# Patient Record
Sex: Female | Born: 1945 | Race: White | Hispanic: No | Marital: Married | Smoking: Never smoker
Health system: Southern US, Community
[De-identification: ages and names within clinical notes are randomized; demographics above are authoritative.]

## PROBLEM LIST (undated history)

## (undated) DIAGNOSIS — R252 Cramp and spasm: Secondary | ICD-10-CM

## (undated) DIAGNOSIS — K589 Irritable bowel syndrome without diarrhea: Secondary | ICD-10-CM

## (undated) DIAGNOSIS — R200 Anesthesia of skin: Secondary | ICD-10-CM

## (undated) DIAGNOSIS — Z800 Family history of malignant neoplasm of digestive organs: Secondary | ICD-10-CM

## (undated) DIAGNOSIS — E039 Hypothyroidism, unspecified: Secondary | ICD-10-CM

## (undated) DIAGNOSIS — E559 Vitamin D deficiency, unspecified: Secondary | ICD-10-CM

## (undated) DIAGNOSIS — K5790 Diverticulosis of intestine, part unspecified, without perforation or abscess without bleeding: Secondary | ICD-10-CM

## (undated) DIAGNOSIS — N183 Chronic kidney disease, stage 3 unspecified: Secondary | ICD-10-CM

## (undated) DIAGNOSIS — F419 Anxiety disorder, unspecified: Secondary | ICD-10-CM

## (undated) DIAGNOSIS — I5030 Unspecified diastolic (congestive) heart failure: Secondary | ICD-10-CM

## (undated) DIAGNOSIS — Z803 Family history of malignant neoplasm of breast: Secondary | ICD-10-CM

## (undated) DIAGNOSIS — F319 Bipolar disorder, unspecified: Secondary | ICD-10-CM

## (undated) DIAGNOSIS — F39 Unspecified mood [affective] disorder: Secondary | ICD-10-CM

## (undated) DIAGNOSIS — M8580 Other specified disorders of bone density and structure, unspecified site: Secondary | ICD-10-CM

## (undated) DIAGNOSIS — R202 Paresthesia of skin: Secondary | ICD-10-CM

## (undated) DIAGNOSIS — E785 Hyperlipidemia, unspecified: Secondary | ICD-10-CM

## (undated) DIAGNOSIS — R7303 Prediabetes: Secondary | ICD-10-CM

## (undated) HISTORY — DX: Family history of malignant neoplasm of breast: Z80.3

## (undated) HISTORY — DX: Prediabetes: R73.03

## (undated) HISTORY — DX: Family history of malignant neoplasm of digestive organs: Z80.0

## (undated) HISTORY — DX: Diverticulosis of intestine, part unspecified, without perforation or abscess without bleeding: K57.90

## (undated) HISTORY — DX: Unspecified diastolic (congestive) heart failure: I50.30

## (undated) HISTORY — DX: Vitamin D deficiency, unspecified: E55.9

## (undated) HISTORY — DX: Irritable bowel syndrome without diarrhea: K58.9

## (undated) HISTORY — DX: Anxiety disorder, unspecified: F41.9

## (undated) HISTORY — DX: Hypothyroidism, unspecified: E03.9

## (undated) HISTORY — DX: Hyperlipidemia, unspecified: E78.5

## (undated) HISTORY — DX: Morbid (severe) obesity due to excess calories: E66.01

## (undated) HISTORY — DX: Other specified disorders of bone density and structure, unspecified site: M85.80

## (undated) HISTORY — DX: Unspecified mood (affective) disorder: F39

## (undated) HISTORY — DX: Irritable bowel syndrome, unspecified: K58.9

## (undated) HISTORY — DX: Chronic kidney disease, stage 3 unspecified: N18.30

## (undated) HISTORY — PX: OTHER SURGICAL HISTORY: SHX169

---

## 1997-08-01 ENCOUNTER — Other Ambulatory Visit: Admission: RE | Admit: 1997-08-01 | Discharge: 1997-08-01 | Payer: Self-pay | Admitting: Internal Medicine

## 1998-08-26 ENCOUNTER — Other Ambulatory Visit: Admission: RE | Admit: 1998-08-26 | Discharge: 1998-08-26 | Payer: Self-pay | Admitting: Internal Medicine

## 1999-09-17 ENCOUNTER — Other Ambulatory Visit: Admission: RE | Admit: 1999-09-17 | Discharge: 1999-09-17 | Payer: Self-pay | Admitting: Internal Medicine

## 2000-10-20 ENCOUNTER — Other Ambulatory Visit: Admission: RE | Admit: 2000-10-20 | Discharge: 2000-10-20 | Payer: Self-pay | Admitting: Internal Medicine

## 2002-03-15 ENCOUNTER — Other Ambulatory Visit: Admission: RE | Admit: 2002-03-15 | Discharge: 2002-03-15 | Payer: Self-pay | Admitting: Internal Medicine

## 2002-04-30 ENCOUNTER — Encounter: Admission: RE | Admit: 2002-04-30 | Discharge: 2002-04-30 | Payer: Self-pay | Admitting: Internal Medicine

## 2002-04-30 ENCOUNTER — Encounter: Payer: Self-pay | Admitting: Internal Medicine

## 2003-06-24 ENCOUNTER — Other Ambulatory Visit: Admission: RE | Admit: 2003-06-24 | Discharge: 2003-06-24 | Payer: Self-pay | Admitting: Internal Medicine

## 2004-08-14 ENCOUNTER — Other Ambulatory Visit: Admission: RE | Admit: 2004-08-14 | Discharge: 2004-08-14 | Payer: Self-pay | Admitting: Internal Medicine

## 2005-09-14 ENCOUNTER — Other Ambulatory Visit: Admission: RE | Admit: 2005-09-14 | Discharge: 2005-09-14 | Payer: Self-pay | Admitting: Internal Medicine

## 2006-06-09 ENCOUNTER — Ambulatory Visit (HOSPITAL_COMMUNITY): Admission: RE | Admit: 2006-06-09 | Discharge: 2006-06-09 | Payer: Self-pay | Admitting: *Deleted

## 2006-06-09 HISTORY — PX: COLONOSCOPY: SHX174

## 2006-10-04 ENCOUNTER — Other Ambulatory Visit: Admission: RE | Admit: 2006-10-04 | Discharge: 2006-10-04 | Payer: Self-pay | Admitting: Endocrinology

## 2007-10-16 ENCOUNTER — Other Ambulatory Visit: Admission: RE | Admit: 2007-10-16 | Discharge: 2007-10-16 | Payer: Self-pay | Admitting: Internal Medicine

## 2007-10-25 ENCOUNTER — Encounter: Admission: RE | Admit: 2007-10-25 | Discharge: 2007-10-25 | Payer: Self-pay | Admitting: Internal Medicine

## 2009-04-30 ENCOUNTER — Other Ambulatory Visit: Admission: RE | Admit: 2009-04-30 | Discharge: 2009-04-30 | Payer: Self-pay | Admitting: Internal Medicine

## 2009-12-10 ENCOUNTER — Encounter: Admission: RE | Admit: 2009-12-10 | Discharge: 2009-12-10 | Payer: Self-pay | Admitting: Internal Medicine

## 2010-03-07 ENCOUNTER — Other Ambulatory Visit: Payer: Self-pay | Admitting: Internal Medicine

## 2010-03-07 DIAGNOSIS — N281 Cyst of kidney, acquired: Secondary | ICD-10-CM

## 2010-03-23 ENCOUNTER — Ambulatory Visit
Admission: RE | Admit: 2010-03-23 | Discharge: 2010-03-23 | Disposition: A | Discharge status: Home / Self Care | Payer: Commercial Indemnity | Source: Ambulatory Visit | Attending: Internal Medicine | Admitting: Internal Medicine

## 2010-03-23 DIAGNOSIS — N281 Cyst of kidney, acquired: Secondary | ICD-10-CM

## 2010-07-03 NOTE — Op Note (Signed)
NAME:  MIRREN, GEST NO.:  [id number]   MEDICAL RECORD NO.:  [id number]          PATIENT TYPE:  AMB   LOCATION:  ENDO                         FACILITY:  [facility]   PHYSICIAN:  Georgiana Spinner, M.D.    DATE OF BIRTH:  July 25, 1945   DATE OF PROCEDURE:  06/09/2006  DATE OF DISCHARGE:                               OPERATIVE REPORT   PROCEDURE:  Colonoscopy.   INDICATIONS:  Colon polyps.   ANESTHESIA:  Fentanyl 100 mcg, Versed 10 mg.   DESCRIPTION OF PROCEDURE:  With the patient mildly sedated, in the left  lateral decubitus position, the Pentax videoscopic colonoscope was  inserted into the rectum and passed under direct vision to the cecum  identified by the ileocecal valve and the base of the cecum both of  which were photographed.  From this point, the colonoscope was slowly  withdrawn taking circumferential views of the colonic mucosa stopping  only in the rectum which appeared normal on direct and showed  hemorrhoids on retroflex view.  The endoscope was straightened and  withdrawn.  The patient's vital signs and pulse oximeter remained  stable.  The patient tolerated the procedure well without apparent  complications.   FINDINGS:  Internal hemorrhoids and some thickening of the sigmoid colon  consistent with diverticulosis.   PLAN:  Have patient follow up with me in about 5 years for repeat  examination or as needed.           ______________________________  Georgiana Spinner, M.D.     GMO/MEDQ  D:  06/09/2006  T:  06/09/2006  Job:  [id number]

## 2011-05-05 ENCOUNTER — Other Ambulatory Visit (HOSPITAL_COMMUNITY)
Admission: RE | Admit: 2011-05-05 | Discharge: 2011-05-05 | Disposition: A | Discharge status: Home / Self Care | Payer: Medicare Other | Source: Ambulatory Visit | Attending: Internal Medicine | Admitting: Internal Medicine

## 2011-05-05 DIAGNOSIS — Z01419 Encounter for gynecological examination (general) (routine) without abnormal findings: Secondary | ICD-10-CM | POA: Insufficient documentation

## 2011-05-17 ENCOUNTER — Encounter: Payer: Self-pay | Admitting: Gastroenterology

## 2011-06-16 ENCOUNTER — Other Ambulatory Visit: Payer: Self-pay | Admitting: Rheumatology

## 2011-06-16 ENCOUNTER — Ambulatory Visit (AMBULATORY_SURGERY_CENTER): Payer: Medicare Other | Admitting: *Deleted

## 2011-06-16 ENCOUNTER — Encounter: Payer: Self-pay | Admitting: Gastroenterology

## 2011-06-16 VITALS — Ht 62.0 in | Wt 187.7 lb

## 2011-06-16 DIAGNOSIS — M549 Dorsalgia, unspecified: Secondary | ICD-10-CM

## 2011-06-16 DIAGNOSIS — Z1211 Encounter for screening for malignant neoplasm of colon: Secondary | ICD-10-CM

## 2011-06-16 MED ORDER — MOVIPREP 100 G PO SOLR: ORAL | Status: DC

## 2011-06-18 ENCOUNTER — Encounter: Payer: Self-pay | Admitting: Gastroenterology

## 2011-06-18 ENCOUNTER — Ambulatory Visit
Admission: RE | Admit: 2011-06-18 | Discharge: 2011-06-18 | Disposition: A | Discharge status: Home / Self Care | Payer: Medicare Other | Source: Ambulatory Visit | Attending: Rheumatology | Admitting: Rheumatology

## 2011-06-18 DIAGNOSIS — M549 Dorsalgia, unspecified: Secondary | ICD-10-CM

## 2011-06-30 ENCOUNTER — Encounter: Payer: Commercial Indemnity | Admitting: Gastroenterology

## 2011-07-06 ENCOUNTER — Telehealth: Payer: Self-pay | Admitting: Gastroenterology

## 2011-07-07 NOTE — Telephone Encounter (Signed)
mailed pt a free coupon, pt aware

## 2011-07-08 ENCOUNTER — Telehealth: Payer: Self-pay | Admitting: Gastroenterology

## 2011-07-08 NOTE — Telephone Encounter (Signed)
Already gave pt a free prep

## 2011-07-09 NOTE — Telephone Encounter (Signed)
New coupon sent to pt.

## 2011-08-18 ENCOUNTER — Ambulatory Visit (AMBULATORY_SURGERY_CENTER): Payer: Medicare Other | Admitting: *Deleted

## 2011-08-18 ENCOUNTER — Encounter: Payer: Self-pay | Admitting: Gastroenterology

## 2011-08-18 VITALS — Ht 62.0 in | Wt 182.1 lb

## 2011-08-18 DIAGNOSIS — Z1211 Encounter for screening for malignant neoplasm of colon: Secondary | ICD-10-CM

## 2011-09-01 ENCOUNTER — Ambulatory Visit (AMBULATORY_SURGERY_CENTER): Payer: Medicare Other | Admitting: Gastroenterology

## 2011-09-01 ENCOUNTER — Encounter: Payer: Self-pay | Admitting: Gastroenterology

## 2011-09-01 VITALS — BP 120/39 | HR 80 | Temp 97.2°F | Resp 18 | Ht 62.0 in | Wt 187.0 lb

## 2011-09-01 DIAGNOSIS — Z1211 Encounter for screening for malignant neoplasm of colon: Secondary | ICD-10-CM

## 2011-09-01 DIAGNOSIS — K5790 Diverticulosis of intestine, part unspecified, without perforation or abscess without bleeding: Secondary | ICD-10-CM

## 2011-09-01 DIAGNOSIS — K5730 Diverticulosis of large intestine without perforation or abscess without bleeding: Secondary | ICD-10-CM

## 2011-09-01 HISTORY — DX: Diverticulosis of intestine, part unspecified, without perforation or abscess without bleeding: K57.90

## 2011-09-01 HISTORY — PX: COLONOSCOPY: SHX174

## 2011-09-01 MED ORDER — SODIUM CHLORIDE 0.9 % IV SOLN: 500.0000 mL | INTRAVENOUS | Status: DC

## 2011-09-01 NOTE — Progress Notes (Signed)
Patient did not experience any of the following events: a burn prior to discharge; a fall within the facility; wrong site/side/patient/procedure/implant event; or a hospital transfer or hospital admission upon discharge from the facility. (G8907) Patient did not have preoperative order for IV antibiotic SSI prophylaxis. (G8918)  

## 2011-09-01 NOTE — Patient Instructions (Addendum)
Discharge instructions given with verbal understanding. Handouts on diverticulosis and a high fiber diet given. Resume previous medications.YOU HAD AN ENDOSCOPIC PROCEDURE TODAY AT THE [facility]: Refer to the procedure report that was given to you for any specific questions about what was found during the examination.  If the procedure report does not answer your questions, please call your gastroenterologist to clarify.  If you requested that your care partner not be given the details of your procedure findings, then the procedure report has been included in a sealed envelope for you to review at your convenience later.  YOU SHOULD EXPECT: Some feelings of bloating in the abdomen. Passage of more gas than usual.  Walking can help get rid of the air that was put into your GI tract during the procedure and reduce the bloating. If you had a lower endoscopy (such as a colonoscopy or flexible sigmoidoscopy) you may notice spotting of blood in your stool or on the toilet paper. If you underwent a bowel prep for your procedure, then you may not have a normal bowel movement for a few days.  DIET: Your first meal following the procedure should be a light meal and then it is ok to progress to your normal diet.  A half-sandwich or bowl of soup is an example of a good first meal.  Heavy or fried foods are harder to digest and may make you feel nauseous or bloated.  Likewise meals heavy in dairy and vegetables can cause extra gas to form and this can also increase the bloating.  Drink plenty of fluids but you should avoid alcoholic beverages for 24 hours.  ACTIVITY: Your care partner should take you home directly after the procedure.  You should plan to take it easy, moving slowly for the rest of the day.  You can resume normal activity the day after the procedure however you should NOT DRIVE or use heavy machinery for 24 hours (because of the sedation medicines used during the test).    SYMPTOMS TO  REPORT IMMEDIATELY: A gastroenterologist can be reached at any hour.  During normal business hours, 8:30 AM to 5:00 PM Monday through Friday, call [phone].  After hours and on weekends, please call the GI answering service at [phone] who will take a message and have the physician on call contact you.   Following lower endoscopy (colonoscopy or flexible sigmoidoscopy):  Excessive amounts of blood in the stool  Significant tenderness or worsening of abdominal pains  Swelling of the abdomen that is new, acute  Fever of 100F or higher  FOLLOW UP: If any biopsies were taken you will be contacted by phone or by letter within the next 1-3 weeks.  Call your gastroenterologist if you have not heard about the biopsies in 3 weeks.  Our staff will call the home number listed on your records the next business day following your procedure to check on you and address any questions or concerns that you may have at that time regarding the information given to you following your procedure. This is a courtesy call and so if there is no answer at the home number and we have not heard from you through the emergency physician on call, we will assume that you have returned to your regular daily activities without incident.  SIGNATURES/CONFIDENTIALITY: You and/or your care partner have signed paperwork which will be entered into your electronic medical record.  These signatures attest to the fact that that the information above on your   After Visit Summary has been reviewed and is understood.  Full responsibility of the confidentiality of this discharge information lies with you and/or your care-partner.   

## 2011-09-01 NOTE — Op Note (Addendum)
[facility] [address] [place], [place]  [address]  COLONOSCOPY PROCEDURE REPORT  PATIENT:  [name], [name]  MR#:  [id number] BIRTHDATE:  November 04, 1945, 66 yrs. old  GENDER:  female ENDOSCOPIST:  Vania Rea. Jarold Motto, MD, [facility] REF. BY:  Pearson Grippe, M.D. PROCEDURE DATE:  09/01/2011 PROCEDURE:  Average-risk screening colonoscopy G0121 ASA CLASS:  Class II INDICATIONS:  Routine Risk Screening MEDICATIONS:   propofol (Diprivan) 120 mg IV  DESCRIPTION OF PROCEDURE:   After the risks and benefits and of the procedure were explained, informed consent was obtained. Digital rectal exam was performed and revealed no abnormalities. The LB PCF-H180AL [id number] endoscope was introduced through the anus and advanced to the cecum, which was identified by both the appendix and ileocecal valve.  The quality of the prep was excellent, using MoviPrep.  The instrument was then slowly withdrawn as the colon was fully examined. <<PROCEDUREIMAGES>>  FINDINGS:  There were mild diverticular changes in left colon. diverticulosis was found.  No polyps or cancers were seen. Retroflexed views in the rectum revealed no abnormalities.    The scope was then withdrawn from the patient and the procedure completed.  COMPLICATIONS:  None ENDOSCOPIC IMPRESSION: 1) Diverticulosis,mild,left sided diverticulosis 2) No polyps or cancers RECOMMENDATIONS: 1) High fiber diet. 2) Given your significant family history of colon cancer, you should have a repeat colonoscopy in 5 years  REPEAT EXAM:  No  ______________________________ Vania Rea. Jarold Motto, MD, [facility]  CC:  n. REVISED:  09/01/2011 09:22 AM eSIGNED:   Vania Rea. Laurens Matheny at 09/01/2011 09:22 AM  Ivin Booty, [id number]

## 2011-09-02 ENCOUNTER — Telehealth: Payer: Self-pay

## 2011-09-02 NOTE — Telephone Encounter (Signed)
  Follow up Call-  Call back number 09/01/2011  Post procedure Call Back phone  # [phone]  Permission to leave phone message Yes     Patient questions:  Do you have a fever, pain , or abdominal swelling? no Pain Score  0 *  Have you tolerated food without any problems? yes  Have you been able to return to your normal activities? yes  Do you have any questions about your discharge instructions: Diet   no Medications  no Follow up visit  no  Do you have questions or concerns about your Care? no  Actions: * If pain score is 4 or above: No action needed, pain <4.

## 2013-01-03 ENCOUNTER — Other Ambulatory Visit (HOSPITAL_COMMUNITY)
Admission: RE | Admit: 2013-01-03 | Discharge: 2013-01-03 | Disposition: A | Discharge status: Home / Self Care | Payer: Medicare Other | Source: Ambulatory Visit | Attending: Internal Medicine | Admitting: Internal Medicine

## 2013-01-03 DIAGNOSIS — Z124 Encounter for screening for malignant neoplasm of cervix: Secondary | ICD-10-CM | POA: Insufficient documentation

## 2013-06-21 ENCOUNTER — Other Ambulatory Visit: Payer: Self-pay | Admitting: Internal Medicine

## 2013-06-21 DIAGNOSIS — R971 Elevated cancer antigen 125 [CA 125]: Secondary | ICD-10-CM

## 2013-06-25 ENCOUNTER — Ambulatory Visit
Admission: RE | Admit: 2013-06-25 | Discharge: 2013-06-25 | Disposition: A | Discharge status: Home / Self Care | Payer: Medicare Other | Source: Ambulatory Visit | Attending: Internal Medicine | Admitting: Internal Medicine

## 2013-06-25 DIAGNOSIS — R971 Elevated cancer antigen 125 [CA 125]: Secondary | ICD-10-CM

## 2013-08-29 ENCOUNTER — Encounter: Payer: Self-pay | Admitting: Gynecology

## 2013-08-29 ENCOUNTER — Telehealth: Payer: Self-pay | Admitting: Gastroenterology

## 2013-08-29 ENCOUNTER — Ambulatory Visit (INDEPENDENT_AMBULATORY_CARE_PROVIDER_SITE_OTHER): Payer: Medicare Other | Admitting: Gynecology

## 2013-08-29 VITALS — BP 126/78 | Ht 61.0 in | Wt 170.0 lb

## 2013-08-29 DIAGNOSIS — Z780 Asymptomatic menopausal state: Secondary | ICD-10-CM

## 2013-08-29 DIAGNOSIS — N951 Menopausal and female climacteric states: Secondary | ICD-10-CM

## 2013-08-29 DIAGNOSIS — R971 Elevated cancer antigen 125 [CA 125]: Secondary | ICD-10-CM

## 2013-08-29 DIAGNOSIS — N952 Postmenopausal atrophic vaginitis: Secondary | ICD-10-CM

## 2013-08-29 DIAGNOSIS — K921 Melena: Secondary | ICD-10-CM

## 2013-08-29 NOTE — Progress Notes (Addendum)
[name] [name] 06-21-1945 [id number]   History:    68 y.o.  for annual gyn exam who is new to the practice. What brought her for followup is that she recently saw her PCP Dr. Jani Gravel and she had personally requested a CA 125 for screening and was otherwise asymptomatic. Her CA 125 came back elevated at 117.8. Patient denied any weight changes or change in appetite. She denied any GU or GI issues. Patient's father did have history of colon cancer. Patient stated that she had a colonoscopy by Dr. Verl Blalock in 2013 and was reported to be normal. Patient denies any vaginal bleeding. Many years ago she had been on HRT for menopausal symptoms. Patient denied any past history of abnormal Pap smears and stated that in April this year it was done by her PCP and was normal. She is being treated for hypertension and hypothyroidism. She also brought a copy of her labs from her PCP which had included a normal CBC normal comprehensive metabolic panel normal hemoglobin A1c, lipid profile with elevated triglycerides at 222 and total cholesterol 212 and LDL elevated 108.  When these results became available her PCP ordered an ultrasound which was done on May 11 and demonstrated normal size uterus with an endometrial stripe of 3.9 mm (normal postmenopausal less than 5 mm) normal right ovary. Left ovary not visualized due to large amount of bowel gas and the adnexa was described.  Normal mammogram September 2014   Past medical history,surgical history, family history and social history were all reviewed and documented in the EPIC chart.  Gynecologic History No LMP recorded. Patient is postmenopausal. Contraception: post menopausal status Last Pap:  2015 . Results were: normal Last mammogram:  2014 . Results were: normal  Obstetric History OB History  Gravida Para Term Preterm AB SAB TAB Ectopic Multiple Living  2 2        2     # Outcome Date GA Lbr Len/2nd Weight Sex Delivery Anes PTL Lv  2 PAR            1 PAR                ROS: A ROS was performed and pertinent positives and negatives are included in the history.  GENERAL: No fevers or chills. HEENT: No change in vision, no earache, sore throat or sinus congestion. NECK: No pain or stiffness. CARDIOVASCULAR: No chest pain or pressure. No palpitations. PULMONARY: No shortness of breath, cough or wheeze. GASTROINTESTINAL: No abdominal pain, nausea, vomiting or diarrhea, melena or bright red blood per rectum. GENITOURINARY: No urinary frequency, urgency, hesitancy or dysuria. MUSCULOSKELETAL: No joint or muscle pain, no back pain, no recent trauma. DERMATOLOGIC: No rash, no itching, no lesions. ENDOCRINE: No polyuria, polydipsia, no heat or cold intolerance. No recent change in weight. HEMATOLOGICAL: No anemia or easy bruising or bleeding. NEUROLOGIC: No headache, seizures, numbness, tingling or weakness. PSYCHIATRIC: No depression, no loss of interest in normal activity or change in sleep pattern.     Exam: chaperone present  BP 126/78  Ht 5\' 1"  (1.549 m)  Wt 170 lb (77.111 kg)  BMI 32.14 kg/m2  Body mass index is 32.14 kg/(m^2).  General appearance : Well developed well nourished female. No acute distress HEENT: Neck supple, trachea midline, no carotid bruits, no thyroidmegaly Lungs: Clear to auscultation, no rhonchi or wheezes, or rib retractions  Heart: Regular rate and rhythm, no murmurs or gallops Breast:Examined in sitting and supine position were symmetrical in  appearance, no palpable masses or tenderness,  no skin retraction, no nipple inversion, no nipple discharge, no skin discoloration, no axillary or supraclavicular lymphadenopathy Abdomen: no palpable masses or tenderness, no rebound or guarding Extremities: no edema or skin discoloration or tenderness  Pelvic:  Bartholin, Urethra, Skene Glands: Within normal limits             Vagina: No gross lesions or discharge, vaginal atrophy   Cervix: No gross lesions or  discharge  Uterus   anteverted , normal size, shape and consistency, non-tender and mobile  Adnexa  Without masses or tenderness  Anus and perineum  normal   Rectovaginal  normal sphincter tone without palpated masses or tenderness             Hemoccult  positive tested in office today     Assessment/Plan:  68 y.o. female for annual exam as well as to discuss the elevated CA 125 which she had requested and her PCP ran. We had a lengthy discussion of potential causes both GYN and non-GYN related as well as malignant medical conditions that could contribute to elevated CA 125. We are going to do an ultrasound here in the office in a one-2 weeks to better evaluate the left ovary that was not visualized completely because of bowel gas at time of the ultrasound in May of this year. I'm going to ask her to followup with a gastroenterologist. I'm going to refer her to my colleague Dr. Carlean Purl since Dr. Sharlett Iles has recently retired. (Positive Hemoccult stool today) to see if we can determine this elevated CA 125. I will repeat the test today ( ROMA-1) as well. Patient due for a mammogram in September of this year.  Note: This dictation was prepared with  Dragon/digital dictation along withSmart phrase technology. Any transcriptional errors that result from this process are unintentional.   Terrance Mass MD, 12:53 PM 08/29/2013

## 2013-08-29 NOTE — Telephone Encounter (Signed)
Patient declines to schedule with the APP. I have scheduled her an appt with Dr. Carlean Purl for 11/07/13.  I have put her on the cancellation list.

## 2013-08-30 ENCOUNTER — Telehealth: Payer: Self-pay | Admitting: Internal Medicine

## 2013-08-30 NOTE — Telephone Encounter (Signed)
Pt has made an appt with Dr. Carlean Purl. She will complete more stool cards from her PCP.

## 2013-09-07 LAB — OVARIAN MALIGNANCY RISK-ROMA
CA125: 15 U/mL (ref <35)
HE4: 90 pmol (ref <151)
ROMA PREMENOPAUSAL: 2.46 — AB (ref <1.31)
ROMA Postmenopausal: 1.93 (ref <2.77)

## 2013-09-13 ENCOUNTER — Encounter: Payer: Self-pay | Admitting: Registered Nurse

## 2013-09-14 ENCOUNTER — Other Ambulatory Visit: Payer: Self-pay | Admitting: Gynecology

## 2013-09-14 ENCOUNTER — Ambulatory Visit (INDEPENDENT_AMBULATORY_CARE_PROVIDER_SITE_OTHER): Payer: Medicare Other

## 2013-09-14 ENCOUNTER — Encounter: Payer: Self-pay | Admitting: Gynecology

## 2013-09-14 ENCOUNTER — Ambulatory Visit (INDEPENDENT_AMBULATORY_CARE_PROVIDER_SITE_OTHER): Payer: Medicare Other | Admitting: Gynecology

## 2013-09-14 DIAGNOSIS — R971 Elevated cancer antigen 125 [CA 125]: Secondary | ICD-10-CM

## 2013-09-14 DIAGNOSIS — N840 Polyp of corpus uteri: Secondary | ICD-10-CM | POA: Diagnosis not present

## 2013-09-14 HISTORY — PX: ENDOMETRIAL BIOPSY: SHX622

## 2013-09-14 NOTE — Progress Notes (Signed)
   Patient presents to the office today for ultrasound for better assessment of her ovaries since her PCP had drawn a CA 125 on her recently and was found to be elevated at a value 117. Due to bowel gas one of the ovaries was not seen and this was the reason for today's patient's office visit. Her history is as follows:  What brought her for followup is that she recently saw her PCP Dr. Jani Gravel and she had personally requested a CA 125 for screening and was otherwise asymptomatic. Her CA 125 came back elevated at 117.8. Patient denied any weight changes or change in appetite. She denied any GU or GI issues. Patient's father did have history of colon cancer. Patient stated that she had a colonoscopy by Dr. Verl Blalock in 2013 and was reported to be normal. Patient denies any vaginal bleeding. Many years ago she had been on HRT for menopausal symptoms. Patient denied any past history of abnormal Pap smears and stated that in April this year it was done by her PCP and was normal.  We had proceeded with ordering a ROMA-1 ovarian cancer screening test and the results were as follows:  Results for SHERREL, PLOCH (MRN [id number]) as of 09/14/2013 13:40  Ref. Range 08/29/2013 12:08  CA125 Latest Range: <35 U/mL 15  HE4 Latest Range: <151 pM 90  ROMA Postmenopausal Latest Range: <2.77  1.93  ROMA Premenopa usal Latest Range: <1.31  2.46 (H)    During that office visit on July 15 her Hemoccult tests was positive. Patient was informed and she has a followup appointment with her gastroenterologist in September. She denied any hematochezia.  During today's ultrasound there was questionable endometrial polyp so besides the ultrasound a sonohysterogram was done. The ultrasound demonstrated the following: Uterus measures 6.9 x 5.4 x 3.3 cm with endometrial stripe of 5.7 mm. The cervix was cleansed with Betadine solution and a sterile catheter was introduced into the uterine cavity normal saline was  instilled. 2 small polypoid lesions were noted one on the anterior uterine wall measuring 9 x 4 mm and the second one posterior wall measuring 6 x 4 mm. No free fluid in the cul-de-sac was noted. Otherwise normal uterus and ovaries.  Patient will have a CA 125 repeated today. She will follow up with her gastroenterologist in September. We did do an endometrial biopsy today and tissues submitted for histological evaluation for which we will await results next week. Regardless of the pathology report (if benign) I have recommended we proceed with a resectoscopic polypectomy as an outpatient procedure.We had a lengthy discussion of potential causes both GYN and non-GYN related as well as malignant medical conditions that could contribute to elevated CA 125.

## 2013-09-15 LAB — CA 125: CA 125: 8.3 U/mL (ref 0.0–30.2)

## 2013-09-19 ENCOUNTER — Telehealth: Payer: Self-pay

## 2013-09-19 NOTE — Telephone Encounter (Signed)
Patient advised.

## 2013-09-19 NOTE — Telephone Encounter (Signed)
Tell her this is not unusual. This will subside. If not were going to remove the polyps that is contributing to this at time of her surgery.

## 2013-09-19 NOTE — Telephone Encounter (Signed)
Patient called because she is concerned that she is still seeing some light red blood on the tissue paper each morning and some darker discharge during the day. She had endo bx done Friday and she expected by 6 days after this would be stopped.  She said she still feels kind of "bloated" in the bottom of her abd since the procedure.

## 2013-10-26 ENCOUNTER — Encounter (HOSPITAL_COMMUNITY): Payer: Self-pay

## 2013-11-02 ENCOUNTER — Encounter: Payer: Self-pay | Admitting: Gynecology

## 2013-11-02 ENCOUNTER — Ambulatory Visit (INDEPENDENT_AMBULATORY_CARE_PROVIDER_SITE_OTHER): Payer: Medicare Other | Admitting: Gynecology

## 2013-11-02 VITALS — BP 134/78

## 2013-11-02 DIAGNOSIS — N840 Polyp of corpus uteri: Secondary | ICD-10-CM

## 2013-11-02 DIAGNOSIS — Z780 Asymptomatic menopausal state: Secondary | ICD-10-CM

## 2013-11-02 DIAGNOSIS — Z01818 Encounter for other preprocedural examination: Secondary | ICD-10-CM

## 2013-11-02 DIAGNOSIS — N951 Menopausal and female climacteric states: Secondary | ICD-10-CM

## 2013-11-02 NOTE — Progress Notes (Signed)
[name] [name] is an 68 y.o. female for preop consultation for her scheduled resectoscopic polypectomy. Her history as follows:  Patient had seen her PCP Dr. Jani Gravel and she had personally requested a CA 125 for screening and was otherwise asymptomatic. Her CA 125 came back elevated at 117.8. Patient denied any weight changes or change in appetite. She denied any GU or GI issues. Patient's father did have history of colon cancer. Patient stated that she had a colonoscopy by Dr. Verl Blalock in 2013 and was reported to be normal. Patient denies any vaginal bleeding. Many years ago she had been on HRT for menopausal symptoms. Patient denied any past history of abnormal Pap smears and stated that in April this year it was done by her PCP and was normal.  We had proceeded with ordering a ROMA-1 ovarian cancer screening test and the results were as follows: Results for [name] [name], [name] [name] (MRN [id number]) as of 09/14/2013 13:40   Ref. Range  08/29/2013 12:08   CA125  Latest Range: <35 U/mL  15   HE4  Latest Range: <151 pM  90   ROMA Postmenopausal  Latest Range: <2.77  1.93   ROMA Premenopa  usal  Latest Range: <1.31  2.46 (H)    During that office visit on July 15 her Hemoccult tests was positive. Patient was informed and she has a followup appointment with her gastroenterologist in September. She denied any hematochezia.  Ultrasound and sonohysterogram 09/14/2013 with results as follows: there was questionable endometrial polyp so besides the ultrasound a sonohysterogram was done. The ultrasound demonstrated the following:  Uterus measures 6.9 x 5.4 x 3.3 cm with endometrial stripe of 5.7 mm. The cervix was cleansed with Betadine solution and a sterile catheter was introduced into the uterine cavity normal saline was instilled. 2 small polypoid lesions were noted one on the anterior uterine wall measuring 9 x 4 mm and the second one posterior wall measuring 6 x 4 mm. No free fluid in the cul-de-sac  was noted. Otherwise normal uterus and ovaries.  Her CA 125 was repeated on 09/14/2013 and results were normal. She scheduled to the gastroenterologist September 9. Endometrial biopsy done on that same office visit demonstrated the following: Diagnosis Endometrium, biopsy - ENDOMETRIOID TYPE POLYP. - ATROPHIC APPEARING ENDOMETRIUM. - THERE IS NO EVIDENCE OF HYPERPLASIA OR MALIGNANCY.   Pertinent Gynecological History: Menses: post-menopausal Bleeding: none Contraception: post menopausal status DES exposure: denies Blood transfusions: none Sexually transmitted diseases: no past history Previous GYN Procedures: 2 NSVD  Last mammogram: normal Date: 2014 Last pap: normal Date: 2015 OB History: G2, P2   Menstrual History: Menarche age: 68  No LMP recorded. Patient is postmenopausal.    Past Medical History  Diagnosis Date  . Hyperlipidemia   . Hypothyroidism   . Anxiety     being weaned off lithium will be off on 06/23/11  . Diverticulosis 09/01/2011    mild, left sided    Past Surgical History  Procedure Laterality Date  . Colonoscopy  06/09/06    Dr.Orr  . Endometrial biopsy  09/14/2013    Dr. Uvaldo Rising    Family History  Problem Relation Age of Onset  . Diabetes Mother   . Colon cancer Father   . Ovarian cancer Neg Hx     Social History:  reports that she has never smoked. She has never used smokeless tobacco. She reports that she does not drink alcohol or use illicit drugs.  Allergies: No Known Allergies   (  Not in a hospital admission)  REVIEW OF SYSTEMS: A ROS was performed and pertinent positives and negatives are included in the history.  GENERAL: No fevers or chills. HEENT: No change in vision, no earache, sore throat or sinus congestion. NECK: No pain or stiffness. CARDIOVASCULAR: No chest pain or pressure. No palpitations. PULMONARY: No shortness of breath, cough or wheeze. GASTROINTESTINAL: No abdominal pain, nausea, vomiting or diarrhea, melena or  bright red blood per rectum. GENITOURINARY: No urinary frequency, urgency, hesitancy or dysuria. MUSCULOSKELETAL: No joint or muscle pain, no back pain, no recent trauma. DERMATOLOGIC: No rash, no itching, no lesions. ENDOCRINE: No polyuria, polydipsia, no heat or cold intolerance. No recent change in weight. HEMATOLOGICAL: No anemia or easy bruising or bleeding. NEUROLOGIC: No headache, seizures, numbness, tingling or weakness. PSYCHIATRIC: No depression, no loss of interest in normal activity or change in sleep pattern.     Blood pressure 134/78.  Physical Exam:  HEENT:unremarkable Neck:Supple, midline, no thyroid megaly, no carotid bruits Lungs:  Clear to auscultation no rhonchi's or wheezes Heart:Regular rate and rhythm, no murmurs or gallops Breast Exam: Done earlier this year normal Abdomen: Soft nontender no rebound guarding Pelvic:BUS within normal limits Vagina: Atrophic changes Cervix: No lesions or discharge Uterus: As described above on ultrasound Adnexa: As described above on ultrasound Extremities: No cords, no edema Rectal: As described above   Assessment/Plan: Patient scheduled for resectoscopic polypectomy. The risks benefits and pros and cons of the operation were discussed. Other risks discussed as follows:                        Patient was counseled as to the risk of surgery to include the following:  1. Infection (prohylactic antibiotics will be administered)  2. DVT/Pulmonary Embolism (prophylactic pneumo compression stockings will be used)  3.Trauma to internal organs requiring additional surgical procedure to repair any injury to     Internal organs requiring perhaps additional hospitalization days.  4.Hemmorhage requiring transfusion and blood products which carry risks such as             anaphylactic reaction, hepatitis and AIDS  Patient had received literature information on the procedure scheduled and all her questions were answered and fully accepts  all risk.   [facility] HMD12:43 PMTD@Note : This dictation was prepared with  Dragon/digital dictation along withSmart phrase technology. Any transcriptional errors that result from this process are unintentional.      Terrance Mass 11/02/2013, 11:24 AM  Note: This dictation was prepared with  Dragon/digital dictation along withSmart phrase technology. Any transcriptional errors that result from this process are unintentional.

## 2013-11-07 ENCOUNTER — Encounter: Payer: Self-pay | Admitting: Internal Medicine

## 2013-11-07 ENCOUNTER — Ambulatory Visit (INDEPENDENT_AMBULATORY_CARE_PROVIDER_SITE_OTHER): Payer: Medicare Other | Admitting: Internal Medicine

## 2013-11-07 VITALS — BP 130/62 | HR 88 | Ht 61.0 in | Wt 158.0 lb

## 2013-11-07 DIAGNOSIS — R195 Other fecal abnormalities: Secondary | ICD-10-CM

## 2013-11-07 NOTE — Progress Notes (Signed)
Subjective:    Patient ID: [name] [name], female    DOB: [birth year], 68 y.o.   MRN: [id number]  HPI  The patient is a very nice lady who had an elevated CA 125 after she requested this for screening. She saw Dr. Toney Rakes of gynecology and a repeat test was normal. She has some uterine polyps and is awaiting surgery remove those. During a pelvic exam with rectal she was heme positive. Subsequently spontaneously passed stools x3, guaiac testing were negative. These were done through primary care. She had a normal colonoscopy in 2013 by my partner Dr. Sharlett Iles. She did have diarrhea in the spring and early summer, that is resolved. Work up was negative the stool studies. On one occasion she might have had some pink discharge on the toilet paper with wiping though no frank bleeding.  No Known Allergies Outpatient Prescriptions Prior to Visit  Medication Sig Dispense Refill  . b complex vitamins tablet Take 1 tablet by mouth daily.      . Biotin 5000 MCG TABS Take 1 tablet by mouth daily.      . Calcium Carbonate-Vitamin D (CALCIUM 600+D) 600-400 MG-UNIT per tablet Take 1 tablet by mouth 2 (two) times daily.      . carbamazepine (TEGRETOL XR) 200 MG 12 hr tablet Take 200-600 mg by mouth 2 (two) times daily. One tablet in the am and two tablets before bedtime.      . Cholecalciferol (VITAMIN D-3) 1000 UNITS CAPS Take 1 capsule by mouth daily.      . Chromium-Cinnamon (CINNAMON PLUS CHROMIUM PO) Take 2 tablets by mouth 2 (two) times daily.      . Coenzyme Q10 (CO Q 10 PO) Take 300 mg by mouth daily.      . Magnesium 400 MG TABS Take 1 tablet by mouth daily.      . Multiple Vitamins-Minerals (MULTIVITAMIN WITH MINERALS) tablet Take 1 tablet by mouth daily.      . Omega-3 Fatty Acids (FISH OIL) 1200 MG CAPS Take 1 capsule by mouth 2 (two) times daily.       . Probiotic Product (PROBIOTIC DAILY PO) Take 2 capsules by mouth daily.      . simvastatin (ZOCOR) 10 MG tablet Take 10 mg by mouth at  bedtime.      [name] SYNTHROID 88 MCG tablet Take 1 tablet by mouth Daily.       No facility-administered medications prior to visit.   Past Medical History  Diagnosis Date  . Hyperlipidemia   . Hypothyroidism   . Anxiety     being weaned off lithium will be off on 06/23/11  . Diverticulosis 09/01/2011    mild, left sided   Past Surgical History  Procedure Laterality Date  . Colonoscopy  06/09/06    Dr.Orr  . Endometrial biopsy  09/14/2013    Dr. Uvaldo Rising  . Colonoscopy  09/01/2011   History   Social History  . Marital Status: Married    Spouse Name: N/A    Number of Children: 2  . Years of Education: N/A   Occupational History  . Retired    Social History Main Topics  . Smoking status: Never Smoker   . Smokeless tobacco: Never Used  . Alcohol Use: No  . Drug Use: No   Social History Narrative   Married 1 son one daughter retired.   Family History  Problem Relation Age of Onset  . Diabetes Mother   . Colon cancer Father 71  .  Ovarian cancer Neg Hx   . Colon polyps Neg Hx     Review of Systems She does open in that she is anxious and concerned about health problems or potential health problems. Otherwise, As per history of present illness all other negative    Objective:   Physical Exam Pleasant obese elderly white woman looking younger than stated age. She has some alopecia on her scalp  Rectal exam with female staff present:  Anoderm inspection revealed bulging small hemorrhoid, no mass on digital exam, brown stool present small amount No mass or rectocele present.     Assessment & Plan:   1. Heme positive stool    With negative colonoscopy in 2013 and the findings today of a small hemorrhoid, I would not repeat any type of endoscopic evaluation. She is nervous and anxious about her father having had colon cancer though he died from that at age 53. I explained that she is not an increased risk of colon cancer given his advanced age and would not  recommend repeating a colonoscopy sooner than 10 years on a routine basis. She could certainly do stool testing for blood and/or DNA at about 5 years, but that is probably overfill as well. It may be necessary to relieve her anxiety.  I appreciate the opportunity to care for this patient.   CC: Jani Gravel, MD Also copy Dr. Uvaldo Rising

## 2013-11-07 NOTE — Patient Instructions (Signed)
Please follow up with Dr Carlean Purl as needed.  Thank you for choosing [facility]!

## 2013-11-09 ENCOUNTER — Encounter (HOSPITAL_COMMUNITY): Payer: Self-pay

## 2013-11-09 ENCOUNTER — Encounter (HOSPITAL_COMMUNITY)
Admission: RE | Admit: 2013-11-09 | Discharge: 2013-11-09 | Disposition: A | Discharge status: Home / Self Care | Payer: Medicare Other | Source: Ambulatory Visit | Attending: Gynecology | Admitting: Gynecology

## 2013-11-09 DIAGNOSIS — N840 Polyp of corpus uteri: Secondary | ICD-10-CM | POA: Diagnosis not present

## 2013-11-09 DIAGNOSIS — R971 Elevated cancer antigen 125 [CA 125]: Secondary | ICD-10-CM | POA: Diagnosis not present

## 2013-11-09 HISTORY — DX: Cramp and spasm: R25.2

## 2013-11-09 HISTORY — DX: Paresthesia of skin: R20.2

## 2013-11-09 HISTORY — DX: Anesthesia of skin: R20.0

## 2013-11-09 HISTORY — DX: Paresthesia of skin: R20.0

## 2013-11-09 LAB — URINALYSIS, ROUTINE W REFLEX MICROSCOPIC
Bilirubin Urine: NEGATIVE
GLUCOSE, UA: NEGATIVE mg/dL
Hgb urine dipstick: NEGATIVE
KETONES UR: NEGATIVE mg/dL
Leukocytes, UA: NEGATIVE
Nitrite: NEGATIVE
PROTEIN: NEGATIVE mg/dL
Specific Gravity, Urine: <1.005 — ABNORMAL LOW (ref 1.005–1.030)
UROBILINOGEN UA: 0.2 mg/dL (ref 0.0–1.0)
pH: 7 (ref 5.0–8.0)

## 2013-11-09 LAB — BASIC METABOLIC PANEL
Anion gap: 12 (ref 5–15)
BUN: 27 mg/dL — ABNORMAL HIGH (ref 6–23)
CALCIUM: 10.1 mg/dL (ref 8.4–10.5)
CO2: 28 mEq/L (ref 19–32)
Chloride: 102 mEq/L (ref 96–112)
Creatinine, Ser: 0.92 mg/dL (ref 0.50–1.10)
GFR, EST AFRICAN AMERICAN: 72 mL/min — AB (ref >90)
GFR, EST NON AFRICAN AMERICAN: 63 mL/min — AB (ref >90)
GLUCOSE: 103 mg/dL — AB (ref 70–99)
Potassium: 4.3 mEq/L (ref 3.7–5.3)
SODIUM: 142 meq/L (ref 137–147)

## 2013-11-09 LAB — CBC
HCT: 37.7 % (ref 36.0–46.0)
HEMOGLOBIN: 13 g/dL (ref 12.0–15.0)
MCH: 32 pg (ref 26.0–34.0)
MCHC: 34.5 g/dL (ref 30.0–36.0)
MCV: 92.9 fL (ref 78.0–100.0)
PLATELETS: 221 10*3/uL (ref 150–400)
RBC: 4.06 MIL/uL (ref 3.87–5.11)
RDW: 12.3 % (ref 11.5–15.5)
WBC: 7.4 10*3/uL (ref 4.0–10.5)

## 2013-11-09 NOTE — Patient Instructions (Signed)
Your procedure is scheduled on:  Monday, November 12, 2013  Enter through the [employer] [facility] at: 6:00 AM  Pick up the phone at the desk and dial [phone].  Call this number if you have problems the morning of surgery: [phone].  Remember: Do NOT eat food: AFTER MIDNIGHT SUNDAY Do NOT drink clear liquids after:  AFTER MIDNIGHT SUNDAY Take these medicines the morning of surgery with a SIP OF WATER: SYNTHROID, TEGRETOL  Do NOT wear jewelry (body piercing), metal hair clips/bobby pins, make-up, or nail polish. Do NOT wear lotions, powders, or perfumes.  You may wear deoderant. Do NOT shave for 48 hours prior to surgery. Do NOT bring valuables to the hospital. Contacts, dentures, or bridgework may not be worn into surgery. Have a responsible adult drive you home and stay with you for 24 hours after your procedure

## 2013-11-11 MED ORDER — DEXTROSE 5 % IV SOLN
2.0000 g | INTRAVENOUS | Status: AC
  Administered 2013-11-12: 2 g via INTRAVENOUS
  Filled 2013-11-11: qty 2

## 2013-11-12 ENCOUNTER — Ambulatory Visit (HOSPITAL_COMMUNITY)
Admission: RE | Admit: 2013-11-12 | Discharge: 2013-11-12 | Disposition: A | Discharge status: Home / Self Care | Payer: Medicare Other | Source: Ambulatory Visit | Attending: Gynecology | Admitting: Gynecology

## 2013-11-12 ENCOUNTER — Encounter (HOSPITAL_COMMUNITY)
Admission: RE | Disposition: A | Discharge status: Home / Self Care | Payer: Self-pay | Source: Ambulatory Visit | Attending: Gynecology

## 2013-11-12 ENCOUNTER — Ambulatory Visit (HOSPITAL_COMMUNITY): Payer: Medicare Other | Admitting: Anesthesiology

## 2013-11-12 ENCOUNTER — Encounter (HOSPITAL_COMMUNITY): Payer: Medicare Other | Admitting: Anesthesiology

## 2013-11-12 ENCOUNTER — Encounter (HOSPITAL_COMMUNITY): Payer: Self-pay

## 2013-11-12 DIAGNOSIS — R971 Elevated cancer antigen 125 [CA 125]: Secondary | ICD-10-CM | POA: Insufficient documentation

## 2013-11-12 DIAGNOSIS — N840 Polyp of corpus uteri: Secondary | ICD-10-CM

## 2013-11-12 DIAGNOSIS — N856 Intrauterine synechiae: Secondary | ICD-10-CM

## 2013-11-12 DIAGNOSIS — Z9889 Other specified postprocedural states: Secondary | ICD-10-CM

## 2013-11-12 HISTORY — PX: DILATATION & CURETTAGE/HYSTEROSCOPY WITH TRUECLEAR: SHX6353

## 2013-11-12 SURGERY — DILATATION & CURETTAGE/HYSTEROSCOPY WITH TRUCLEAR
Anesthesia: General | Site: Vagina

## 2013-11-12 MED ORDER — PROPOFOL 10 MG/ML IV BOLUS
INTRAVENOUS | Status: DC | PRN
  Administered 2013-11-12: 140 mg via INTRAVENOUS

## 2013-11-12 MED ORDER — MIDAZOLAM HCL 2 MG/2ML IJ SOLN
INTRAMUSCULAR | Status: AC
  Filled 2013-11-12: qty 2

## 2013-11-12 MED ORDER — MEPERIDINE HCL 25 MG/ML IJ SOLN: 6.2500 mg | INTRAMUSCULAR | Status: DC | PRN

## 2013-11-12 MED ORDER — DEXAMETHASONE SODIUM PHOSPHATE 4 MG/ML IJ SOLN
INTRAMUSCULAR | Status: AC
  Filled 2013-11-12: qty 1

## 2013-11-12 MED ORDER — LIDOCAINE HCL (CARDIAC) 20 MG/ML IV SOLN
INTRAVENOUS | Status: DC | PRN
  Administered 2013-11-12: 40 mg via INTRAVENOUS

## 2013-11-12 MED ORDER — SILVER NITRATE-POT NITRATE 75-25 % EX MISC
CUTANEOUS | Status: AC
  Filled 2013-11-12: qty 1

## 2013-11-12 MED ORDER — LIDOCAINE HCL (CARDIAC) 20 MG/ML IV SOLN
INTRAVENOUS | Status: AC
  Filled 2013-11-12: qty 5

## 2013-11-12 MED ORDER — FENTANYL CITRATE 0.05 MG/ML IJ SOLN
INTRAMUSCULAR | Status: DC | PRN
  Administered 2013-11-12: 50 ug via INTRAVENOUS
  Administered 2013-11-12: 25 ug via INTRAVENOUS
  Administered 2013-11-12: 50 ug via INTRAVENOUS

## 2013-11-12 MED ORDER — SCOPOLAMINE 1 MG/3DAYS TD PT72
MEDICATED_PATCH | TRANSDERMAL | Status: AC
  Filled 2013-11-12: qty 1

## 2013-11-12 MED ORDER — DEXAMETHASONE SODIUM PHOSPHATE 10 MG/ML IJ SOLN
INTRAMUSCULAR | Status: DC | PRN
  Administered 2013-11-12: 4 mg via INTRAVENOUS

## 2013-11-12 MED ORDER — FENTANYL CITRATE 0.05 MG/ML IJ SOLN: 25.0000 ug | INTRAMUSCULAR | Status: DC | PRN

## 2013-11-12 MED ORDER — SODIUM CHLORIDE 0.9 % IJ SOLN
INTRAMUSCULAR | Status: AC
  Filled 2013-11-12: qty 50

## 2013-11-12 MED ORDER — FENTANYL CITRATE 0.05 MG/ML IJ SOLN
INTRAMUSCULAR | Status: AC
  Filled 2013-11-12: qty 5

## 2013-11-12 MED ORDER — SODIUM CHLORIDE 0.9 % IR SOLN
Status: DC | PRN
  Administered 2013-11-12: 3000 mL

## 2013-11-12 MED ORDER — ONDANSETRON HCL 4 MG/2ML IJ SOLN: 4.0000 mg | Freq: Once | INTRAMUSCULAR | Status: DC | PRN

## 2013-11-12 MED ORDER — EPHEDRINE SULFATE 50 MG/ML IJ SOLN
INTRAMUSCULAR | Status: DC | PRN
  Administered 2013-11-12: 10 mg via INTRAVENOUS

## 2013-11-12 MED ORDER — ONDANSETRON HCL 4 MG/2ML IJ SOLN
INTRAMUSCULAR | Status: AC
  Filled 2013-11-12: qty 2

## 2013-11-12 MED ORDER — PHENYLEPHRINE 40 MCG/ML (10ML) SYRINGE FOR IV PUSH (FOR BLOOD PRESSURE SUPPORT)
PREFILLED_SYRINGE | INTRAVENOUS | Status: AC
  Filled 2013-11-12: qty 5

## 2013-11-12 MED ORDER — LACTATED RINGERS IV SOLN
INTRAVENOUS | Status: DC
  Administered 2013-11-12: 07:00:00 via INTRAVENOUS

## 2013-11-12 MED ORDER — ONDANSETRON HCL 4 MG/2ML IJ SOLN
INTRAMUSCULAR | Status: DC | PRN
Start: 2013-11-12 — End: 2013-11-12
  Administered 2013-11-12: 4 mg via INTRAVENOUS

## 2013-11-12 MED ORDER — MIDAZOLAM HCL 2 MG/2ML IJ SOLN
INTRAMUSCULAR | Status: DC | PRN
  Administered 2013-11-12: 0.5 mg via INTRAVENOUS

## 2013-11-12 MED ORDER — PROPOFOL 10 MG/ML IV EMUL
INTRAVENOUS | Status: AC
  Filled 2013-11-12: qty 20

## 2013-11-12 MED ORDER — EPHEDRINE 5 MG/ML INJ
INTRAVENOUS | Status: AC
  Filled 2013-11-12: qty 10

## 2013-11-12 MED ORDER — VASOPRESSIN 20 UNIT/ML IJ SOLN
INTRAMUSCULAR | Status: DC | PRN
  Administered 2013-11-12: 20 [IU]

## 2013-11-12 MED ORDER — KETOROLAC TROMETHAMINE 30 MG/ML IJ SOLN: 15.0000 mg | Freq: Once | INTRAMUSCULAR | Status: DC | PRN

## 2013-11-12 MED ORDER — VASOPRESSIN 20 UNIT/ML IJ SOLN
INTRAMUSCULAR | Status: AC
  Filled 2013-11-12: qty 1

## 2013-11-12 SURGICAL SUPPLY — 27 items
BLADE INCISOR TRUC PLUS 2.9 (ABLATOR) ×1 IMPLANT
CANISTERS HI-FLOW 3000CC (CANNISTER) ×2 IMPLANT
CATH FOLEY 2WAY SLVR 30CC 16FR (CATHETERS) IMPLANT
CATH ROBINSON RED A/P 16FR (CATHETERS) ×2 IMPLANT
CLOTH BEACON ORANGE TIMEOUT ST (SAFETY) ×2 IMPLANT
CONTAINER PREFILL 10% NBF 60ML (FORM) ×4 IMPLANT
DRAPE HYSTEROSCOPY (DRAPE) ×2 IMPLANT
ELECTRODE ROLLER VERSAPOINT (ELECTRODE) IMPLANT
ELECTRODE RT ANGLE VERSAPOINT (CUTTING LOOP) IMPLANT
GLOVE BIOGEL PI IND STRL 8 (GLOVE) ×1 IMPLANT
GLOVE BIOGEL PI INDICATOR 8 (GLOVE) ×1
GLOVE ECLIPSE 7.5 STRL STRAW (GLOVE) ×4 IMPLANT
GOWN STRL REUS W/TWL LRG LVL3 (GOWN DISPOSABLE) ×4 IMPLANT
INCISOR TRUC PLUS BLADE 2.9 (ABLATOR) ×2
KIT HYSTEROSCOPY TRUCLEAR (ABLATOR) ×2 IMPLANT
LOOP ANGLED CUTTING 22FR (CUTTING LOOP) IMPLANT
MORCELLATOR RECIP TRUCLEAR 4.0 (ABLATOR) IMPLANT
PACK VAGINAL MINOR WOMEN LF (CUSTOM PROCEDURE TRAY) ×2 IMPLANT
PAD OB MATERNITY 4.3X12.25 (PERSONAL CARE ITEMS) ×2 IMPLANT
PAD PREP 24X48 CUFFED NSTRL (MISCELLANEOUS) ×2 IMPLANT
PLUG CATH AND CAP STER (CATHETERS) IMPLANT
SET BERKELEY SUCTION TUBING (SUCTIONS) IMPLANT
SYR 30ML LL (SYRINGE) IMPLANT
TOWEL OR 17X24 6PK STRL BLUE (TOWEL DISPOSABLE) ×4 IMPLANT
VACURETTE 8 RIGID CVD (CANNULA) IMPLANT
VACURETTE 9 RIGID CVD (CANNULA) IMPLANT
WATER STERILE IRR 1000ML POUR (IV SOLUTION) ×2 IMPLANT

## 2013-11-12 NOTE — Op Note (Signed)
   Operative Note  11/12/2013  8:16 AM  PATIENT:  [name] [name]  68 y.o. female  PRE-OPERATIVE DIAGNOSIS:  endometrial lesion, elevated CA 125  POST-OPERATIVE DIAGNOSIS:  endometrial lesion  PROCEDURE:  Procedure(s): RESECTOSCOPIC POLYPECTOMY Irish Lack, D&C  SURGEON:  Surgeon(s): Terrance Mass, MD  ANESTHESIA:   general  FINDINGS:Enometrial synechia and two small polyps (ant/post)  DESCRIPTION OF OPERATION:DESCRIPTION OF OPERATION: The patient was taken to the operating room where she underwent successful general endotracheal anesthesia. The patient was identified during the time out as well as procedure to be performed. The patient received Cefotan 2 g IV for infection prophylaxis and PAS stockings for DVT prophylaxis. The vagina and perineum were prepped and draped in usual sterile fashion and the legs were placed in the high lithotomy position. A red rubber Quentin Cornwall was inserted to evacuate the bladder was contents for approximately 50 cc's . Exam under anesthesia demonstrated a uterus 4  week size with no palpable adnexal masses. A weighted speculum was placed on the posterior vaginal vault. A single-tooth tenaculum was placed on the anterior cervical lip. The uterus sounded to 6 1/2 cm. Pratt dilators to size 21 mm was utilized to dilate the cervical canal. The 2.9  mm Smith and Nephew operative resectoscope was utilized after introducing the operative sheath with obturator. The obturator was then removed the scope was inserted followed by insertion of the  True Clear Ultra Reciprocating Resectoscope 4.0. Normal saline was the distending media. It was noted at this time the patient had  Endometrial synechaia from anterior uterine wall to posterior uterine wall and two small polyps (ant and pos).   . With the true clear morcellator the endometrial synechia and polyps  were resected and passed off the operative field for histological evaluation. Adequate hemostasis was present.  Patient tolerated procedure well was extubated and transferred to recovery room stable vital signs.  EBL was minimal. Fluid deficit was170 cc's.Distending media was normal saline.  ESTIMATED BLOOD LOSS: no blood loss   Intake/Output Summary (Last 24 hours) at 11/12/13 0816 Last data filed at 11/12/13 0753  Gross per 24 hour  Intake    900 ml  Output     50 ml  Net    850 ml     BLOOD ADMINISTERED:none   LOCAL MEDICATIONS USED:  OTHER Pitressin Pararacervical: Pitressin  10 cc's/50 cc's NACL  SPECIMEN:  Source of Specimen:  Endometrial polyp   DISPOSITION OF SPECIMEN:  PATHOLOGY  COUNTS:  YES  PLAN OF CARE: Transfer to PACU  [facility] HMD8:16 AMTD@

## 2013-11-12 NOTE — Transfer of Care (Signed)
Immediate Anesthesia Transfer of Care Note  Patient: [name] [name]  Procedure(s) Performed: Procedure(s): RESECTOSCOPIC POLYPECTOMY W TRUCLEAR, D&C (N/A)  Patient Location: PACU  Anesthesia Type:General  Level of Consciousness: awake, alert , oriented and patient cooperative  Airway & Oxygen Therapy: Patient Spontanous Breathing and Patient connected to nasal cannula oxygen  Post-op Assessment: Report given to PACU RN and Post -op Vital signs reviewed and stable  Post vital signs: Reviewed and stable  Complications: No apparent anesthesia complications

## 2013-11-12 NOTE — Discharge Instructions (Signed)

## 2013-11-12 NOTE — Interval H&P Note (Signed)
History and Physical Interval Note:  11/12/2013 7:16 AM  [name] [name]  has presented today for surgery, with the diagnosis of endometrial lesion, elevated CA 125  The various methods of treatment have been discussed with the patient and family. After consideration of risks, benefits and other options for treatment, the patient has consented to  Procedure(s): RESECTOSCOPIC POLYPECTOMY W TRUCLEAR, D&C (N/A) as a surgical intervention .  The patient's history has been reviewed, patient examined, no change in status, stable for surgery.  I have reviewed the patient's chart and labs.  Questions were answered to the patient's satisfaction.     Terrance Mass

## 2013-11-12 NOTE — Anesthesia Preprocedure Evaluation (Signed)
Anesthesia Evaluation  Patient identified by MRN, date of birth, ID band Patient awake    Reviewed: Allergy & Precautions, H&P , NPO status , Patient's Chart, lab work & pertinent test results  Airway Mallampati: II TM Distance: >3 FB Neck ROM: full    Dental no notable dental hx. (+) Teeth Intact   Pulmonary neg pulmonary ROS,    Pulmonary exam normal       Cardiovascular negative cardio ROS      Neuro/Psych PSYCHIATRIC DISORDERS Anxiety negative neurological ROS     GI/Hepatic negative GI ROS, Neg liver ROS,   Endo/Other  negative endocrine ROS  Renal/GU negative Renal ROS     Musculoskeletal   Abdominal Normal abdominal exam  (+)   Peds  Hematology negative hematology ROS (+)   Anesthesia Other Findings   Reproductive/Obstetrics negative OB ROS                           Anesthesia Physical Anesthesia Plan  ASA: II  Anesthesia Plan: General   Post-op Pain Management:    Induction: Intravenous  Airway Management Planned: LMA  Additional Equipment:   Intra-op Plan:   Post-operative Plan:   Informed Consent: I have reviewed the patients History and Physical, chart, labs and discussed the procedure including the risks, benefits and alternatives for the proposed anesthesia with the patient or authorized representative who has indicated his/her understanding and acceptance.     Plan Discussed with: CRNA, Surgeon and Anesthesiologist  Anesthesia Plan Comments:         Anesthesia Quick Evaluation

## 2013-11-12 NOTE — Anesthesia Postprocedure Evaluation (Signed)
  Anesthesia Post-op Note  Anesthesia Post Note  Patient: [name] [name]  Procedure(s) Performed: Procedure(s) (LRB): RESECTOSCOPIC POLYPECTOMY W TRUCLEAR, D&C (N/A)  Anesthesia type: General  Patient location: PACU  Post pain: Pain level controlled  Post assessment: Post-op Vital signs reviewed  Last Vitals:  Filed Vitals:   11/12/13 0945  BP: 123/58  Pulse: 85  Temp: 36.6 C  Resp: 16    Post vital signs: Reviewed  Level of consciousness: sedated  Complications: No apparent anesthesia complications

## 2013-11-12 NOTE — Interval H&P Note (Signed)
History and Physical Interval Note:  11/12/2013 7:17 AM  [name] [name]  has presented today for surgery, with the diagnosis of endometrial lesion, elevated CA 125  The various methods of treatment have been discussed with the patient and family. After consideration of risks, benefits and other options for treatment, the patient has consented to  Procedure(s): RESECTOSCOPIC POLYPECTOMY W TRUCLEAR, D&C (N/A) as a surgical intervention .  The patient's history has been reviewed, patient examined, no change in status, stable for surgery.  I have reviewed the patient's chart and labs.  Questions were answered to the patient's satisfaction.     Terrance Mass

## 2013-11-12 NOTE — H&P (View-Only) (Signed)
[name] [name] is an 68 y.o. female for preop consultation for her scheduled resectoscopic polypectomy. Her history as follows:  Patient had seen her PCP Dr. Jani [name] and she had personally requested a CA 125 for screening and was otherwise asymptomatic. Her CA 125 came back elevated at 117.8. Patient denied any weight changes or change in appetite. She denied any GU or GI issues. Patient's father did have history of colon cancer. Patient stated that she had a colonoscopy by Dr. Verl [name] in 2013 and was reported to be normal. Patient denies any vaginal bleeding. Many years ago she had been on HRT for menopausal symptoms. Patient denied any past history of abnormal Pap smears and stated that in April this year it was done by her PCP and was normal.  We had proceeded with ordering a ROMA-1 ovarian cancer screening test and the results were as follows: Results for [name], [name] (MRN [id number]) as of 09/14/2013 13:40   Ref. Range  08/29/2013 12:08   CA125  Latest Range: <35 U/mL  15   HE4  Latest Range: <151 pM  90   ROMA Postmenopausal  Latest Range: <2.77  1.93   ROMA Premenopa  usal  Latest Range: <1.31  2.46 (H)    During that office visit on July 15 her Hemoccult tests was positive. Patient was informed and she has a followup appointment with her gastroenterologist in September. She denied any hematochezia.  Ultrasound and sonohysterogram 09/14/2013 with results as follows: there was questionable endometrial polyp so besides the ultrasound a sonohysterogram was done. The ultrasound demonstrated the following:  Uterus measures 6.9 x 5.4 x 3.3 cm with endometrial stripe of 5.7 mm. The cervix was cleansed with Betadine solution and a sterile catheter was introduced into the uterine cavity normal saline was instilled. 2 small polypoid lesions were noted one on the anterior uterine wall measuring 9 x 4 mm and the second one posterior wall measuring 6 x 4 mm. No free fluid in the cul-de-sac  was noted. Otherwise normal uterus and ovaries.  Her CA 125 was repeated on 09/14/2013 and results were normal. She scheduled to the gastroenterologist September 9. Endometrial biopsy done on that same office visit demonstrated the following: Diagnosis Endometrium, biopsy - ENDOMETRIOID TYPE POLYP. - ATROPHIC APPEARING ENDOMETRIUM. - THERE IS NO EVIDENCE OF HYPERPLASIA OR MALIGNANCY.   Pertinent Gynecological History: Menses: post-menopausal Bleeding: none Contraception: post menopausal status DES exposure: denies Blood transfusions: none Sexually transmitted diseases: no past history Previous GYN Procedures: 2 NSVD  Last mammogram: normal Date: 2014 Last pap: normal Date: 2015 OB History: G2, P2   Menstrual History: Menarche age: 52  No LMP recorded. Patient is postmenopausal.    Past Medical History  Diagnosis Date  . Hyperlipidemia   . Hypothyroidism   . Anxiety     being weaned off lithium will be off on 06/23/11  . Diverticulosis 09/01/2011    mild, left sided    Past Surgical History  Procedure Laterality Date  . Colonoscopy  06/09/06    Dr.Orr  . Endometrial biopsy  09/14/2013    Dr. Uvaldo [name]    Family History  Problem Relation Age of Onset  . Diabetes Mother   . Colon cancer Father   . Ovarian cancer Neg Hx     Social History:  reports that she has never smoked. She has never used smokeless tobacco. She reports that she does not drink alcohol or use illicit drugs.  Allergies: No Known Allergies   (  Not in a hospital admission)  REVIEW OF SYSTEMS: A ROS was performed and pertinent positives and negatives are included in the history.  GENERAL: No fevers or chills. HEENT: No change in vision, no earache, sore throat or sinus congestion. NECK: No pain or stiffness. CARDIOVASCULAR: No chest pain or pressure. No palpitations. PULMONARY: No shortness of breath, cough or wheeze. GASTROINTESTINAL: No abdominal pain, nausea, vomiting or diarrhea, melena or  bright red blood per rectum. GENITOURINARY: No urinary frequency, urgency, hesitancy or dysuria. MUSCULOSKELETAL: No joint or muscle pain, no back pain, no recent trauma. DERMATOLOGIC: No rash, no itching, no lesions. ENDOCRINE: No polyuria, polydipsia, no heat or cold intolerance. No recent change in weight. HEMATOLOGICAL: No anemia or easy bruising or bleeding. NEUROLOGIC: No headache, seizures, numbness, tingling or weakness. PSYCHIATRIC: No depression, no loss of interest in normal activity or change in sleep pattern.     Blood pressure 134/78.  Physical Exam:  HEENT:unremarkable Neck:Supple, midline, no thyroid megaly, no carotid bruits Lungs:  Clear to auscultation no rhonchi's or wheezes Heart:Regular rate and rhythm, no murmurs or gallops Breast Exam: Done earlier this year normal Abdomen: Soft nontender no rebound guarding Pelvic:BUS within normal limits Vagina: Atrophic changes Cervix: No lesions or discharge Uterus: As described above on ultrasound Adnexa: As described above on ultrasound Extremities: No cords, no edema Rectal: As described above   Assessment/Plan: Patient scheduled for resectoscopic polypectomy. The risks benefits and pros and cons of the operation were discussed. Other risks discussed as follows:                        Patient was counseled as to the risk of surgery to include the following:  1. Infection (prohylactic antibiotics will be administered)  2. DVT/Pulmonary Embolism (prophylactic pneumo compression stockings will be used)  3.Trauma to internal organs requiring additional surgical procedure to repair any injury to     Internal organs requiring perhaps additional hospitalization days.  4.Hemmorhage requiring transfusion and blood products which carry risks such as             anaphylactic reaction, hepatitis and AIDS  Patient had received literature information on the procedure scheduled and all her questions were answered and fully accepts  all risk.   [facility] HMD12:43 PMTD@Note : This dictation was prepared with  Dragon/digital dictation along withSmart phrase technology. Any transcriptional errors that result from this process are unintentional.      Terrance Mass 11/02/2013, 11:24 AM  Note: This dictation was prepared with  Dragon/digital dictation along withSmart phrase technology. Any transcriptional errors that result from this process are unintentional.

## 2013-11-12 NOTE — H&P (View-Only) (Signed)
Subjective:    Patient ID: [name] [name], female    DOB: 12-25-1945, 68 y.o.   MRN: [id number]  HPI  The patient is a very nice lady who had an elevated CA 125 after she requested this for screening. She saw Dr. Toney Rakes of gynecology and a repeat test was normal. She has some uterine polyps and is awaiting surgery remove those. During a pelvic exam with rectal she was heme positive. Subsequently spontaneously passed stools x3, guaiac testing were negative. These were done through primary care. She had a normal colonoscopy in 2013 by my partner Dr. Sharlett Iles. She did have diarrhea in the spring and early summer, that is resolved. Work up was negative the stool studies. On one occasion she might have had some pink discharge on the toilet paper with wiping though no frank bleeding.  No Known Allergies Outpatient Prescriptions Prior to Visit  Medication Sig Dispense Refill  . b complex vitamins tablet Take 1 tablet by mouth daily.      . Biotin 5000 MCG TABS Take 1 tablet by mouth daily.      . Calcium Carbonate-Vitamin D (CALCIUM 600+D) 600-400 MG-UNIT per tablet Take 1 tablet by mouth 2 (two) times daily.      . carbamazepine (TEGRETOL XR) 200 MG 12 hr tablet Take 200-600 mg by mouth 2 (two) times daily. One tablet in the am and two tablets before bedtime.      . Cholecalciferol (VITAMIN D-3) 1000 UNITS CAPS Take 1 capsule by mouth daily.      . Chromium-Cinnamon (CINNAMON PLUS CHROMIUM PO) Take 2 tablets by mouth 2 (two) times daily.      . Coenzyme Q10 (CO Q 10 PO) Take 300 mg by mouth daily.      . Magnesium 400 MG TABS Take 1 tablet by mouth daily.      . Multiple Vitamins-Minerals (MULTIVITAMIN WITH MINERALS) tablet Take 1 tablet by mouth daily.      . Omega-3 Fatty Acids (FISH OIL) 1200 MG CAPS Take 1 capsule by mouth 2 (two) times daily.       . Probiotic Product (PROBIOTIC DAILY PO) Take 2 capsules by mouth daily.      . simvastatin (ZOCOR) 10 MG tablet Take 10 mg by mouth at  bedtime.      [name] SYNTHROID 88 MCG tablet Take 1 tablet by mouth Daily.       No facility-administered medications prior to visit.   Past Medical History  Diagnosis Date  . Hyperlipidemia   . Hypothyroidism   . Anxiety     being weaned off lithium will be off on 06/23/11  . Diverticulosis 09/01/2011    mild, left sided   Past Surgical History  Procedure Laterality Date  . Colonoscopy  06/09/06    Dr.Orr  . Endometrial biopsy  09/14/2013    Dr. Uvaldo Rising  . Colonoscopy  09/01/2011   History   Social History  . Marital Status: Married    Spouse Name: N/A    Number of Children: 2  . Years of Education: N/A   Occupational History  . Retired    Social History Main Topics  . Smoking status: Never Smoker   . Smokeless tobacco: Never Used  . Alcohol Use: No  . Drug Use: No   Social History Narrative   Married 1 son one daughter retired.   Family History  Problem Relation Age of Onset  . Diabetes Mother   . Colon cancer Father 64  .  Ovarian cancer Neg Hx   . Colon polyps Neg Hx     Review of Systems She does open in that she is anxious and concerned about health problems or potential health problems. Otherwise, As per history of present illness all other negative    Objective:   Physical Exam Pleasant obese elderly white woman looking younger than stated age. She has some alopecia on her scalp  Rectal exam with female staff present:  Anoderm inspection revealed bulging small hemorrhoid, no mass on digital exam, brown stool present small amount No mass or rectocele present.     Assessment & Plan:   1. Heme positive stool    With negative colonoscopy in 2013 and the findings today of a small hemorrhoid, I would not repeat any type of endoscopic evaluation. She is nervous and anxious about her father having had colon cancer though he died from that at age 24. I explained that she is not an increased risk of colon cancer given his advanced age and would not  recommend repeating a colonoscopy sooner than 10 years on a routine basis. She could certainly do stool testing for blood and/or DNA at about 5 years, but that is probably overfill as well. It may be necessary to relieve her anxiety.  I appreciate the opportunity to care for this patient.   CC: Jani Gravel, MD Also copy Dr. Uvaldo Rising

## 2013-11-13 ENCOUNTER — Encounter (HOSPITAL_COMMUNITY): Payer: Self-pay | Admitting: Gynecology

## 2013-11-26 ENCOUNTER — Encounter: Payer: Self-pay | Admitting: Gynecology

## 2013-11-26 ENCOUNTER — Ambulatory Visit (INDEPENDENT_AMBULATORY_CARE_PROVIDER_SITE_OTHER): Payer: Medicare Other | Admitting: Gynecology

## 2013-11-26 ENCOUNTER — Telehealth: Payer: Self-pay

## 2013-11-26 VITALS — BP 130/72

## 2013-11-26 DIAGNOSIS — Z09 Encounter for follow-up examination after completed treatment for conditions other than malignant neoplasm: Secondary | ICD-10-CM

## 2013-11-26 NOTE — Progress Notes (Signed)
   The patient presented to the office today for 2 week postop visit. Patient is status post resectoscopic polypectomy as a result of preop evaluation demonstrating on sonohysterogram an intracavitary uterine polypoid lesion along with CA 125. Followup CA 125 returned back to normal. Patient is doing well asymptomatic today. Pictures as well as findings and pathology report discussed with the patient.  Pathology report as follows: Endometrial polyp BENIGN ENDOMETRIAL POLYP, NO EVIDENCE OF ATYPIA, HYPERPLASIA OR MALIGNANCY.  Exam: Button urethra Skene was within normal limits Vagina: No lesions or discharge Cervix: No lesions or discharge Bimanual exam not done Rectal exam: Not done  Assessment/plan: Patient status post resectoscopic polypectomy 2 weeks ago doing well patient returned for her normal activity. Patient returned back to the office next year for her annual exam or when necessary. Indication. Patient received vaccine 2 weeks ago.

## 2013-11-26 NOTE — Telephone Encounter (Signed)
Patient had post op earlier and as she left she asked for copies of her reports. She is now inquiring.  She said you op report mentioned two polyps (anterior and posterior) being removed and you had told her two polyps removed.  The pathology report just says "endometrial polyp" and she was concerned and wants to be sure both polyps removed.

## 2013-11-26 NOTE — Telephone Encounter (Signed)
She came back and I spoke with her and explained pathology report.

## 2013-12-17 ENCOUNTER — Encounter: Payer: Self-pay | Admitting: Gynecology

## 2014-05-29 ENCOUNTER — Encounter: Payer: Self-pay | Admitting: Gynecology

## 2014-09-04 ENCOUNTER — Ambulatory Visit (INDEPENDENT_AMBULATORY_CARE_PROVIDER_SITE_OTHER): Payer: Medicare Other | Admitting: Gynecology

## 2014-09-04 ENCOUNTER — Encounter: Payer: Self-pay | Admitting: Gynecology

## 2014-09-04 VITALS — BP 126/80 | Ht 60.5 in | Wt 153.0 lb

## 2014-09-04 DIAGNOSIS — Z01419 Encounter for gynecological examination (general) (routine) without abnormal findings: Secondary | ICD-10-CM | POA: Diagnosis not present

## 2014-09-04 NOTE — Progress Notes (Deleted)
    Leta Bucklin December 13, 1945 [id number]   History:    69 y.o.  for annual gyn exam ***  Past medical history,surgical history, family history and social history were all reviewed and documented in the EPIC chart.  Gynecologic History No LMP recorded. Patient is postmenopausal. Contraception: {method:5051} Last Pap: ***. Results were: {norm/abn:16337} Last mammogram: ***. Results were: {norm/abn:16337}  Obstetric History OB History  Gravida Para Term Preterm AB SAB TAB Ectopic Multiple Living  2 2        2     # Outcome Date GA Lbr Len/2nd Weight Sex Delivery Anes PTL Lv  2 Para      Vag-Spont     1 Para      Vag-Spont          ROS: A ROS was performed and pertinent positives and negatives are included in the history.  GENERAL: No fevers or chills. HEENT: No change in vision, no earache, sore throat or sinus congestion. NECK: No pain or stiffness. CARDIOVASCULAR: No chest pain or pressure. No palpitations. PULMONARY: No shortness of breath, cough or wheeze. GASTROINTESTINAL: No abdominal pain, nausea, vomiting or diarrhea, melena or bright red blood per rectum. GENITOURINARY: No urinary frequency, urgency, hesitancy or dysuria. MUSCULOSKELETAL: No joint or muscle pain, no back pain, no recent trauma. DERMATOLOGIC: No rash, no itching, no lesions. ENDOCRINE: No polyuria, polydipsia, no heat or cold intolerance. No recent change in weight. HEMATOLOGICAL: No anemia or easy bruising or bleeding. NEUROLOGIC: No headache, seizures, numbness, tingling or weakness. PSYCHIATRIC: No depression, no loss of interest in normal activity or change in sleep pattern.     Exam: chaperone present  BP 126/80 mmHg  Ht 5' 0.5" (1.537 m)  Wt 153 lb (69.4 kg)  BMI 29.38 kg/m2  Body mass index is 29.38 kg/(m^2).  General appearance : Well developed well nourished female. No acute distress HEENT: Eyes: no retinal hemorrhage or exudates,  Neck supple, trachea midline, no carotid bruits, no  thyroidmegaly Lungs: Clear to auscultation, no rhonchi or wheezes, or rib retractions  Heart: Regular rate and rhythm, no murmurs or gallops Breast:Examined in sitting and supine position were symmetrical in appearance, no palpable masses or tenderness,  no skin retraction, no nipple inversion, no nipple discharge, no skin discoloration, no axillary or supraclavicular lymphadenopathy Abdomen: no palpable masses or tenderness, no rebound or guarding Extremities: no edema or skin discoloration or tenderness  Pelvic:  Bartholin, Urethra, Skene Glands: Within normal limits             Vagina: No gross lesions or discharge  Cervix: No gross lesions or discharge  Uterus  ***, normal size, shape and consistency, non-tender and mobile  Adnexa  Without masses or tenderness  Anus and perineum  normal   Rectovaginal  normal sphincter tone without palpated masses or tenderness             Hemoccult ***     Assessment/Plan:  69 y.o. female for annual exam Uvaldo Rising H MD, 11:38 AM 09/04/2014

## 2014-09-04 NOTE — Patient Instructions (Signed)

## 2014-09-04 NOTE — Progress Notes (Signed)
[name] [name] [birth year] [id number]   History:    69 y.o.  for annual gyn exam with no complaints today. She is status post resectoscopic polypectomy for benign endometrial polyp in 2015. She reports no vaginal bleeding. Her PCP is Dr. Maudie Mercury who will be doing her blood work next week. Her vaccines are up-to-date. Her colonoscopy was normal in 2013. Her father has history of colon cancer. Patient with no past history of any abnormal Pap smears. Her last bone density study was 2 years ago and we have no report.  Past medical history,surgical history, family history and social history were all reviewed and documented in the EPIC chart.  Gynecologic History No LMP recorded. Patient is postmenopausal. Contraception: post menopausal status Last Pap: 2014. Results were: normal Last mammogram: 2015. Results were: Normal but dense had three-dimensional mammogram  Obstetric History OB History  Gravida Para Term Preterm AB SAB TAB Ectopic Multiple Living  2 2        2     # Outcome Date GA Lbr Len/2nd Weight Sex Delivery Anes PTL Lv  2 Para      Vag-Spont     1 Para      Vag-Spont          ROS: A ROS was performed and pertinent positives and negatives are included in the history.  GENERAL: No fevers or chills. HEENT: No change in vision, no earache, sore throat or sinus congestion. NECK: No pain or stiffness. CARDIOVASCULAR: No chest pain or pressure. No palpitations. PULMONARY: No shortness of breath, cough or wheeze. GASTROINTESTINAL: No abdominal pain, nausea, vomiting or diarrhea, melena or bright red blood per rectum. GENITOURINARY: No urinary frequency, urgency, hesitancy or dysuria. MUSCULOSKELETAL: No joint or muscle pain, no back pain, no recent trauma. DERMATOLOGIC: No rash, no itching, no lesions. ENDOCRINE: No polyuria, polydipsia, no heat or cold intolerance. No recent change in weight. HEMATOLOGICAL: No anemia or easy bruising or bleeding. NEUROLOGIC: No headache, seizures, numbness,  tingling or weakness. PSYCHIATRIC: No depression, no loss of interest in normal activity or change in sleep pattern.     Exam: chaperone present  BP 126/80 mmHg  Ht 5' 0.5" (1.537 m)  Wt 153 lb (69.4 kg)  BMI 29.38 kg/m2  Body mass index is 29.38 kg/(m^2).  General appearance : Well developed well nourished female. No acute distress HEENT: Eyes: no retinal hemorrhage or exudates,  Neck supple, trachea midline, no carotid bruits, no thyroidmegaly Lungs: Clear to auscultation, no rhonchi or wheezes, or rib retractions  Heart: Regular rate and rhythm, no murmurs or gallops Breast:Examined in sitting and supine position were symmetrical in appearance, no palpable masses or tenderness,  no skin retraction, no nipple inversion, no nipple discharge, no skin discoloration, no axillary or supraclavicular lymphadenopathy Abdomen: no palpable masses or tenderness, no rebound or guarding Extremities: no edema or skin discoloration or tenderness  Pelvic:  Bartholin, Urethra, Skene Glands: Within normal limits             Vagina: No gross lesions or discharge, atrophic changes  Cervix: No gross lesions or discharge  Uterus  anteverted, normal size, shape and consistency, non-tender and mobile  Adnexa  Without masses or tenderness  Anus and perineum  normal   Rectovaginal  normal sphincter tone without palpated masses or tenderness             Hemoccult PCP to provide     Assessment/Plan:  69 y.o. female for annual exam on no hormone replacement therapy.  Status post resectoscopic polypectomy for benign endometrial polyp in 2015. Patient scheduled for mammogram and bone density study in the next few months. She scheduled to see her primary care physician next week who will do her blood work. We discussed importance of monthly breast exams. We discussed importance of calcium and vitamin D and weightbearing exercises for osteoporosis prevention. Pap smear not indicated according to the new  guidelines.   Terrance Mass MD, 12:07 PM 09/04/2014

## 2014-12-03 ENCOUNTER — Encounter: Payer: Self-pay | Admitting: Gynecology

## 2015-01-15 ENCOUNTER — Ambulatory Visit (INDEPENDENT_AMBULATORY_CARE_PROVIDER_SITE_OTHER): Payer: Medicare Other | Admitting: Physician Assistant

## 2015-01-15 ENCOUNTER — Encounter: Payer: Self-pay | Admitting: Physician Assistant

## 2015-01-15 VITALS — BP 110/62 | HR 96 | Ht 61.0 in | Wt 165.2 lb

## 2015-01-15 DIAGNOSIS — K648 Other hemorrhoids: Secondary | ICD-10-CM

## 2015-01-15 DIAGNOSIS — K644 Residual hemorrhoidal skin tags: Secondary | ICD-10-CM

## 2015-01-15 NOTE — Progress Notes (Signed)
Agree with Ms. Esterwood's assessment and plan. Carl E. Gessner, MD, FACG   

## 2015-01-15 NOTE — Progress Notes (Signed)
Patient ID: [name] [name], female   DOB: 1945-11-03, 69 y.o.   MRN: [id number]   Subjective:    Patient ID: [name] [name], female    DOB: Nov 23, 1945, 69 y.o.   MRN: [id number]  HPI  Lynnox is a pleasant 69 year old female known to Dr. Carlean Purl. She had undergone colonoscopy in July 2013. She was noted to have mild left colon diverticulosis no polyps or hemorrhoids noted. She comes in today because of a protrusion or not on her rectum. She says this is not painful and has not been bleeding. She has had intermittent vague burning discomfort over the past week or two.Sh e says she is just  aware of a small bump on the outside of her rectum  over the past two weeks and wanted to have it evaluated. She's not had any changes in her bowel habits melena or hematochezia and no complaints of abdominal pain.  Review of Systems Pertinent positive and negative review of systems were noted in the above HPI section.  All other review of systems was otherwise negative.  Outpatient Encounter Prescriptions as of 01/15/2015  Medication Sig  . b complex vitamins tablet Take 1 tablet by mouth daily.  . Biotin 5000 MCG TABS Take 1 tablet by mouth daily.  . Calcium Carbonate-Vitamin D (CALCIUM 600+D) 600-400 MG-UNIT per tablet Take 1 tablet by mouth 2 (two) times daily.  . carbamazepine (TEGRETOL XR) 200 MG 12 hr tablet Take 400 mg by mouth at bedtime.   . Cholecalciferol (VITAMIN D-3) 1000 UNITS CAPS Take 1 capsule by mouth daily.  . Chromium-Cinnamon (CINNAMON PLUS CHROMIUM PO) Take 2 tablets by mouth 2 (two) times daily.  . Coenzyme Q10 (CO Q 10 PO) Take 300 mg by mouth daily.  . Cyanocobalamin (B-12) 5000 MCG SUBL Place 1 tablet under the tongue every morning.  . Magnesium 400 MG TABS Take 1 tablet by mouth daily.  . Multiple Vitamins-Minerals (MULTIVITAMIN WITH MINERALS) tablet Take 1 tablet by mouth daily.  . Omega-3 Fatty Acids (FISH OIL) 1200 MG CAPS Take 3 capsules by mouth 2 (two) times daily.   .  Probiotic Product (PROBIOTIC DAILY PO) Take 2 capsules by mouth daily.  . simvastatin (ZOCOR) 10 MG tablet Take 10 mg by mouth at bedtime.  [name] SYNTHROID 88 MCG tablet Take 1 tablet by mouth Daily.   No facility-administered encounter medications on file as of 01/15/2015.   No Known Allergies There are no active problems to display for this patient.  Social History   Social History  . Marital Status: Married    Spouse Name: N/A  . Number of Children: 2  . Years of Education: N/A   Occupational History  . Retired    Social History Main Topics  . Smoking status: Never Smoker   . Smokeless tobacco: Never Used  . Alcohol Use: No  . Drug Use: No  . Sexual Activity: No   Other Topics Concern  . Not on file   Social History Narrative   Married 1 son one daughter retired.    [name] family history includes Colon cancer in her father; Diabetes in her mother. There is no history of Ovarian cancer or Colon polyps.      Objective:    Filed Vitals:   01/15/15 0940  BP: 110/62  Pulse: 96    Physical Exam  well-developed older white female in no acute distress, pleasant blood pressure 110/62 pulse 96 height 5 foot 1 weight 165. HEENT; nontraumatic, cephalic EOMI PERRLA sclera  anicteric, Cardiovascular ;regular rate and rhythm with S1-S2 no murmur or gallop, Pulmonary ;clear bilaterally, Abdomen; soft nontender nondistended bowel sounds are active there is no palpable mass or hepatosplenomegaly, Rectal; exam patient has no inflamed external hemorrhoid which is nontender and not thrombosed, digital exam negative stool heme negative. Ext; no clubbing cyanosis or edema skin warm dry, Neuropsych; mood and affect appropriate       Assessment & Plan:   #1 69 yo female with symptomatic external hemorrhoid- inflamed but not thrombosed  #2 Colon screening-up to date -Colon 08/2011 #3 Diverticulosis  Plan; Continue prep H cream 3-4 x daily  Tubs soaks  Reassurance  Follow up  with Dr Carlean Purl as needed   Amy Genia Harold PA-C 01/15/2015   Cc: [name] Gravel, MD

## 2015-01-15 NOTE — Patient Instructions (Signed)
Continue Preperation H 3-4 times daily as needed. You are due for a colonoscopy 08/2016. You will get a letter in June of 2018.  Follow up with Dr. Silvano Rusk or Amy [facility] as needed.

## 2015-03-26 DIAGNOSIS — E039 Hypothyroidism, unspecified: Secondary | ICD-10-CM | POA: Diagnosis not present

## 2015-03-26 DIAGNOSIS — R739 Hyperglycemia, unspecified: Secondary | ICD-10-CM | POA: Diagnosis not present

## 2015-03-26 DIAGNOSIS — F39 Unspecified mood [affective] disorder: Secondary | ICD-10-CM | POA: Diagnosis not present

## 2015-03-26 DIAGNOSIS — E785 Hyperlipidemia, unspecified: Secondary | ICD-10-CM | POA: Diagnosis not present

## 2015-03-26 DIAGNOSIS — Z0000 Encounter for general adult medical examination without abnormal findings: Secondary | ICD-10-CM | POA: Diagnosis not present

## 2015-04-02 DIAGNOSIS — E039 Hypothyroidism, unspecified: Secondary | ICD-10-CM | POA: Diagnosis not present

## 2015-04-02 DIAGNOSIS — E785 Hyperlipidemia, unspecified: Secondary | ICD-10-CM | POA: Diagnosis not present

## 2015-04-02 DIAGNOSIS — F319 Bipolar disorder, unspecified: Secondary | ICD-10-CM | POA: Diagnosis not present

## 2015-04-02 DIAGNOSIS — Z0000 Encounter for general adult medical examination without abnormal findings: Secondary | ICD-10-CM | POA: Diagnosis not present

## 2015-04-02 DIAGNOSIS — Z1389 Encounter for screening for other disorder: Secondary | ICD-10-CM | POA: Diagnosis not present

## 2015-08-14 DIAGNOSIS — R030 Elevated blood-pressure reading, without diagnosis of hypertension: Secondary | ICD-10-CM | POA: Diagnosis not present

## 2015-08-28 DIAGNOSIS — Z09 Encounter for follow-up examination after completed treatment for conditions other than malignant neoplasm: Secondary | ICD-10-CM | POA: Diagnosis not present

## 2015-09-10 ENCOUNTER — Encounter: Payer: Self-pay | Admitting: Gynecology

## 2015-09-10 ENCOUNTER — Ambulatory Visit (INDEPENDENT_AMBULATORY_CARE_PROVIDER_SITE_OTHER): Payer: Medicare Other | Admitting: Gynecology

## 2015-09-10 VITALS — BP 122/70 | Ht 60.5 in | Wt 175.0 lb

## 2015-09-10 DIAGNOSIS — M8580 Other specified disorders of bone density and structure, unspecified site: Secondary | ICD-10-CM | POA: Diagnosis not present

## 2015-09-10 DIAGNOSIS — N952 Postmenopausal atrophic vaginitis: Secondary | ICD-10-CM | POA: Diagnosis not present

## 2015-09-10 DIAGNOSIS — Z01419 Encounter for gynecological examination (general) (routine) without abnormal findings: Secondary | ICD-10-CM | POA: Diagnosis not present

## 2015-09-10 DIAGNOSIS — Z780 Asymptomatic menopausal state: Secondary | ICD-10-CM | POA: Diagnosis not present

## 2015-09-10 DIAGNOSIS — R635 Abnormal weight gain: Secondary | ICD-10-CM

## 2015-09-10 MED ORDER — ESTRADIOL 10 MCG VA TABS: 1.0000 | ORAL_TABLET | VAGINAL | 11 refills | Status: DC

## 2015-09-10 NOTE — Patient Instructions (Addendum)
Patient information: High cholesterol (The Basics)  What is cholesterol? - Cholesterol is a substance that is found in the blood. Everyone has some. It is needed for good health. The problem is, people sometimes have too much cholesterol. Compared with people with normal cholesterol, people with high cholesterol have a higher risk of heart attacks, strokes, and other health problems. The higher your cholesterol, the higher your risk of these problems. Cholesterol levels in your body are determined significantly by your diet. Cholesterol levels may also be related to heart disease. The following material helps to explain this relationship and discusses what you can do to help keep your heart healthy. Not all cholesterol is bad. Low-density lipoprotein (LDL) cholesterol is the "bad" cholesterol. It may cause fatty deposits to build up inside your arteries. High-density lipoprotein (HDL) cholesterol is "good." It helps to remove the "bad" LDL cholesterol from your blood. Cholesterol is a very important risk factor for heart disease. Other risk factors are high blood pressure, smoking, stress, heredity, and weight.  The heart muscle gets its supply of blood through the coronary arteries. If your LDL cholesterol is high and your HDL cholesterol is low, you are at risk for having fatty deposits build up in your coronary arteries. This leaves less room through which blood can flow. Without sufficient blood and oxygen, the heart muscle cannot function properly and you may feel chest pains (angina pectoris). When a coronary artery closes up entirely, a part of the heart muscle may die, causing a heart attack (myocardial infarction).  CHECKING CHOLESTEROL When your caregiver sends your blood to a lab to be analyzed for cholesterol, a complete lipid (fat) profile may be done. With this test, the total amount of cholesterol and levels of LDL and HDL are determined. Triglycerides  are a type of fat that circulates in the blood and can also be used to determine heart disease risk. Are there different types of cholesterol? - Yes, there are a few different types. If you get a cholesterol test, you may hear your doctor or nurse talk about: Total cholesterol  LDL cholesterol - Some people call this the "bad" cholesterol. That's because having high LDL levels raises your risk of heart attacks, strokes, and other health problems.  HDL cholesterol - Some people call this the "good" cholesterol. That's because having high HDL levels lowers your risk of heart attacks, strokes, and other health problems.  Non-HDL cholesterol - Non-HDL cholesterol is your total cholesterol minus your HDL cholesterol.  Triglycerides - Triglycerides are not cholesterol. They are a type of fat. But they often get measured when cholesterol is measured. (Having high triglycerides also seems to increase the risk of heart attacks and strokes.)   Keep in mind, though, that many people who cannot meet these goals still have a low risk of heart attacks and strokes. What should I do if my doctor tells me I have high cholesterol? - Ask your doctor what your overall risk of heart attacks and strokes is. High cholesterol, by itself, is not always a reason to worry. Having high cholesterol is just one of many things that can increase your risk of heart attacks and strokes. Other factors that increase your risk include:  Cigarette smoking  High blood pressure  Having a parent, sister, or brother who got heart disease at a young age (Young, in this case, means younger than 55 for men and younger than 65 for women.)  Being a man (Women are at risk, too, but men   have a higher risk.)  Older age  If you are at high risk of heart attacks and strokes, having high cholesterol is a problem. On the other hand, if you have are at low risk, having high cholesterol may not mean much. Should I take medicine to lower cholesterol? - Not  everyone who has high cholesterol needs medicines. Your doctor or nurse will decide if you need them based on your age, family history, and other health concerns.  You should probably take a cholesterol-lowering medicine called a statin if you: Already had a heart attack or stroke  Have known heart disease  Have diabetes  Have a condition called peripheral artery disease, which makes it painful to walk, and happens when the arteries in your legs get clogged with fatty deposits  Have an abdominal aortic aneurysm, which is a widening of the main artery in the belly  Most people with any of the conditions listed above should take a statin no matter what their cholesterol level is. If your doctor or nurse puts you on a statin, stay on it. The medicine may not make you feel any different. But it can help prevent heart attacks, strokes, and death.  Can I lower my cholesterol without medicines? - Yes, you can lower your cholesterol some by:  Avoiding red meat, butter, fried foods, cheese, and other foods that have a lot of saturated fat  Losing weight (if you are overweight)  Being more active Even if these steps do little to change your cholesterol, they can improve your health in many ways.                                                   Cholesterol Control Diet  CONTROLLING CHOLESTEROL WITH DIET Although exercise and lifestyle factors are important, your diet is key. That is because certain foods are known to raise cholesterol and others to lower it. The goal is to balance foods for their effect on cholesterol and more importantly, to replace saturated and trans fat with other types of fat, such as monounsaturated fat, polyunsaturated fat, and omega-3 fatty acids. On average, a person should consume no more than 15 to 17 g of saturated fat daily. Saturated and trans fats are considered "bad" fats, and they will raise LDL cholesterol. Saturated fats are primarily found in animal products such as  meats, butter, and cream. However, that does not mean you need to sacrifice all your favorite foods. Today, there are good tasting, low-fat, low-cholesterol substitutes for most of the things you like to eat. Choose low-fat or nonfat alternatives. Choose round or loin cuts of red meat, since these types of cuts are lowest in fat and cholesterol. Chicken (without the skin), fish, veal, and ground turkey breast are excellent choices. Eliminate fatty meats, such as hot dogs and salami. Even shellfish have little or no saturated fat. Have a 3 oz (85 g) portion when you eat lean meat, poultry, or fish. Trans fats are also called "partially hydrogenated oils." They are oils that have been scientifically manipulated so that they are solid at room temperature resulting in a longer shelf life and improved taste and texture of foods in which they are added. Trans fats are found in stick margarine, some tub margarines, cookies, crackers, and baked goods.  When baking and cooking, oils are an excellent substitute   for butter. The monounsaturated oils are especially beneficial since it is believed they lower LDL and raise HDL. The oils you should avoid entirely are saturated tropical oils, such as coconut and palm.  Remember to eat liberally from food groups that are naturally free of saturated and trans fat, including fish, fruit, vegetables, beans, grains (barley, rice, couscous, bulgur wheat), and pasta (without cream sauces).  IDENTIFYING FOODS THAT LOWER CHOLESTEROL  Soluble fiber may lower your cholesterol. This type of fiber is found in fruits such as apples, vegetables such as broccoli, potatoes, and carrots, legumes such as beans, peas, and lentils, and grains such as barley. Foods fortified with plant sterols (phytosterol) may also lower cholesterol. You should eat at least 2 g per day of these foods for a cholesterol lowering effect.  Read package labels to identify low-saturated fats, trans fats free, and  low-fat foods at the supermarket. Select cheeses that have only 2 to 3 g saturated fat per ounce. Use a heart-healthy tub margarine that is free of trans fats or partially hydrogenated oil. When buying baked goods (cookies, crackers), avoid partially hydrogenated oils. Breads and muffins should be made from whole grains (whole-wheat or whole oat flour, instead of "flour" or "enriched flour"). Buy non-creamy canned soups with reduced salt and no added fats.  FOOD PREPARATION TECHNIQUES  Never deep-fry. If you must fry, either stir-fry, which uses very little fat, or use non-stick cooking sprays. When possible, broil, bake, or roast meats, and steam vegetables. Instead of dressing vegetables with butter or margarine, use lemon and herbs, applesauce and cinnamon (for squash and sweet potatoes), nonfat yogurt, salsa, and low-fat dressings for salads.  LOW-SATURATED FAT / LOW-FAT FOOD SUBSTITUTES Meats / Saturated Fat (g)  Avoid: Steak, marbled (3 oz/85 g) / 11 g   Choose: Steak, lean (3 oz/85 g) / 4 g   Avoid: Hamburger (3 oz/85 g) / 7 g   Choose: Hamburger, lean (3 oz/85 g) / 5 g   Avoid: Ham (3 oz/85 g) / 6 g   Choose: Ham, lean cut (3 oz/85 g) / 2.4 g   Avoid: Chicken, with skin, dark meat (3 oz/85 g) / 4 g   Choose: Chicken, skin removed, dark meat (3 oz/85 g) / 2 g   Avoid: Chicken, with skin, light meat (3 oz/85 g) / 2.5 g   Choose: Chicken, skin removed, light meat (3 oz/85 g) / 1 g  Dairy / Saturated Fat (g)  Avoid: Whole milk (1 cup) / 5 g   Choose: Low-fat milk, 2% (1 cup) / 3 g   Choose: Low-fat milk, 1% (1 cup) / 1.5 g   Choose: Skim milk (1 cup) / 0.3 g   Avoid: Hard cheese (1 oz/28 g) / 6 g   Choose: Skim milk cheese (1 oz/28 g) / 2 to 3 g   Avoid: Cottage cheese, 4% fat (1 cup) / 6.5 g   Choose: Low-fat cottage cheese, 1% fat (1 cup) / 1.5 g   Avoid: Ice cream (1 cup) / 9 g   Choose: Sherbet (1 cup) / 2.5 g   Choose: Nonfat frozen yogurt (1 cup) / 0.3 g    Choose: Frozen fruit bar / trace   Avoid: Whipped cream (1 tbs) / 3.5 g   Choose: Nondairy whipped topping (1 tbs) / 1 g  Condiments / Saturated Fat (g)  Avoid: Mayonnaise (1 tbs) / 2 g   Choose: Low-fat mayonnaise (1 tbs) / 1 g   Avoid:   Butter (1 tbs) / 7 g   Choose: Extra light margarine (1 tbs) / 1 g   Avoid: Coconut oil (1 tbs) / 11.8 g   Choose: Olive oil (1 tbs) / 1.8 g   Choose: Corn oil (1 tbs) / 1.7 g   Choose: Safflower oil (1 tbs) / 1.2 g   Choose: Sunflower oil (1 tbs) / 1.4 g   Choose: Soybean oil (1 tbs) / 2.4 g   Choose: Canola oil (1 tbs) / 1 g   Exercise to Lose Weight Exercise and a healthy diet may help you lose weight. Your doctor may suggest specific exercises. EXERCISE IDEAS AND TIPS  Choose low-cost things you enjoy doing, such as walking, bicycling, or exercising to workout videos.   Take stairs instead of the elevator.   Walk during your lunch break.   Park your car further away from work or school.   Go to a gym or an exercise class.   Start with 5 to 10 minutes of exercise each day. Build up to 30 minutes of exercise 4 to 6 days a week.   Wear shoes with good support and comfortable clothes.   Stretch before and after working out.   Work out until you breathe harder and your heart beats faster.   Drink extra water when you exercise.   Do not do so much that you hurt yourself, feel dizzy, or get very short of breath.  Exercises that burn about 150 calories:  Running 1  miles in 15 minutes.   Playing volleyball for 45 to 60 minutes.   Washing and waxing a car for 45 to 60 minutes.   Playing touch football for 45 minutes.   Walking 1  miles in 35 minutes.   Pushing a stroller 1  miles in 30 minutes.   Playing basketball for 30 minutes.   Raking leaves for 30 minutes.   Bicycling 5 miles in 30 minutes.   Walking 2 miles in 30 minutes.   Dancing for 30 minutes.   Shoveling snow for 15 minutes.   Swimming laps  for 20 minutes.   Walking up stairs for 15 minutes.   Bicycling 4 miles in 15 minutes.   Gardening for 30 to 45 minutes.   Jumping rope for 15 minutes.   Washing windows or floors for 45 to 60 minutes.  Document Released: 03/06/2010 Document Revised: 10/14/2010 Document Reviewed: 03/06/2010 [facility] Patient Information 2012 [place].  Estradiol vaginal tablets What is this medicine? ESTRADIOL (es tra DYE ole) vaginal tablet is used to help relieve symptoms of vaginal irritation and dryness that occurs in some women during menopause. This medicine may be used for other purposes; ask your health care provider or pharmacist if you have questions. What should I tell my health care provider before I take this medicine? They need to know if you have any of these conditions: -abnormal vaginal bleeding -blood vessel disease or blood clots -breast, cervical, endometrial, ovarian, liver, or uterine cancer -dementia -diabetes -gallbladder disease -heart disease or recent heart attack -high blood pressure -high cholesterol -high level of calcium in the blood -hysterectomy -kidney disease -liver disease -migraine headaches -protein C deficiency -protein S deficiency -stroke -systemic lupus erythematosus (SLE) -tobacco smoker -an unusual or allergic reaction to estrogens, other hormones, medicines, foods, dyes, or preservatives -pregnant or trying to get pregnant -breast-feeding How should I use this medicine? This medicine is only for use in the vagina. Do not take by mouth. Wash and dry  your hands before and after use. Read package directions carefully. Unwrap the applicator package. Be sure to use a new applicator for each dose. Use at the same time each day. If the tablet has fallen out of the applicator, but is still in the package, carefully place it back into the applicator. If the tablet has fallen out of the package, that applicator should be thrown out and you should use  a new applicator containing a new tablet. Lie on your back, part and bend your knees. Gently insert the applicator as far as comfortably possible into the vagina. Then, gently press the plunger until the plunger is fully depressed. This will release the tablet into the vagina. Gently remove the applicator. Throw away the applicator after use. Do not use your medicine more often than directed. Do not stop using except on the advice of your doctor or health care professional. Talk to your pediatrician regarding the use of this medicine in children. This medicine is not approved for use in children. A patient package insert for the product will be given with each prescription and refill. Read this sheet carefully each time. The sheet may change frequently. Overdosage: If you think you have taken too much of this medicine contact a poison control center or emergency room at once. NOTE: This medicine is only for you. Do not share this medicine with others. What if I miss a dose? If you miss a dose, take it as soon as you can. If it is almost time for your next dose, take only that dose. Do not take double or extra doses. What may interact with this medicine? Do not take this medicine with any of the following medications: -aromatase inhibitors like aminoglutethimide, anastrozole, exemestane, letrozole, testolactone This medicine may also interact with the following medications: -antibiotics used to treat tuberculosis like rifabutin, rifampin and rifapentene -raloxifene or tamoxifen -warfarin This list may not describe all possible interactions. Give your health care provider a list of all the medicines, herbs, non-prescription drugs, or dietary supplements you use. Also tell them if you smoke, drink alcohol, or use illegal drugs. Some items may interact with your medicine. What should I watch for while using this medicine? Visit your health care professional for regular checks on your progress. You will  need a regular breast and pelvic exam. You should also discuss the need for regular mammograms with your health care professional, and follow his or her guidelines. This medicine can make your body retain fluid, making your fingers, hands, or ankles swell. Your blood pressure can go up. Contact your doctor or health care professional if you feel you are retaining fluid. If you have any reason to think you are pregnant; stop taking this medicine at once and contact your doctor or health care professional. Tobacco smoking increases the risk of getting a blood clot or having a stroke, especially if you are more than 70 years old. You are strongly advised not to smoke. If you wear contact lenses and notice visual changes, or if the lenses begin to feel uncomfortable, consult your eye care specialist. If you are going to have elective surgery, you may need to stop taking this medicine beforehand. Consult your health care professional for advice prior to scheduling the surgery. What side effects may I notice from receiving this medicine? Side effects that you should report to your doctor or health care professional as soon as possible: -allergic reactions like skin rash, itching or hives, swelling of the face, lips, or  tongue -breast tissue changes or discharge -changes in vision -chest pain -confusion, trouble speaking or understanding -dark urine -general ill feeling or flu-like symptoms -light-colored stools -nausea, vomiting -pain, swelling, warmth in the leg -right upper belly pain -severe headaches -shortness of breath -sudden numbness or weakness of the face, arm or leg -trouble walking, dizziness, loss of balance or coordination -unusual vaginal bleeding -yellowing of the eyes or skin Side effects that usually do not require medical attention (report to your doctor or health care professional if they continue or are bothersome): -hair loss -increased hunger or thirst -increased  urination -symptoms of vaginal infection like itching, irritation or unusual discharge -unusually weak or tired This list may not describe all possible side effects. Call your doctor for medical advice about side effects. You may report side effects to FDA at 1-800-FDA-1088. Where should I keep my medicine? Keep out of the reach of children. Store at room temperature between 15 and 30 degrees C (59 and 86 degrees F). Throw away any unused medicine after the expiration date. NOTE: This sheet is a summary. It may not cover all possible information. If you have questions about this medicine, talk to your doctor, pharmacist, or health care provider.    2016, [employer]. (2014-01-16 09:22:51)

## 2015-09-10 NOTE — Progress Notes (Signed)
[name] [name] 1945/03/23 [id number]   History:    70 y.o.  for annual gyn exam with the only complaint is one of vaginal dryness. Also patient has gaining weight since last year. Patient was weighing 153 pounds last year and is up to 175 pounds. In 2015 patient had a resectoscopic polypectomy for benign endometrial polyp. Patient reports no vaginal bleeding.Her PCP is Dr. Maudie Mercury who will be doing her blood work next week. Her vaccines are up-to-date. Her colonoscopy was normal in 2013. Her father has history of colon cancer. Patient with no past history of any abnormal Pap smears. Her last bone density study was normal in 2016.  Past medical history,surgical history, family history and social history were all reviewed and documented in the EPIC chart.  Gynecologic History No LMP recorded. Patient is postmenopausal. Contraception: post menopausal status Last Pap: 2014. Results were: normal Last mammogram: 2016. Results were: normal  Obstetric History OB History  Gravida Para Term Preterm AB Living  2 2       2   SAB TAB Ectopic Multiple Live Births               # Outcome Date GA Lbr Len/2nd Weight Sex Delivery Anes PTL Lv  2 Para      Vag-Spont     1 Para      Vag-Spont          ROS: A ROS was performed and pertinent positives and negatives are included in the history.  GENERAL: No fevers or chills. HEENT: No change in vision, no earache, sore throat or sinus congestion. NECK: No pain or stiffness. CARDIOVASCULAR: No chest pain or pressure. No palpitations. PULMONARY: No shortness of breath, cough or wheeze. GASTROINTESTINAL: No abdominal pain, nausea, vomiting or diarrhea, melena or bright red blood per rectum. GENITOURINARY: No urinary frequency, urgency, hesitancy or dysuria. MUSCULOSKELETAL: No joint or muscle pain, no back pain, no recent trauma. DERMATOLOGIC: No rash, no itching, no lesions. ENDOCRINE: No polyuria, polydipsia, no heat or cold intolerance. No recent change in  weight. HEMATOLOGICAL: No anemia or easy bruising or bleeding. NEUROLOGIC: No headache, seizures, numbness, tingling or weakness. PSYCHIATRIC: No depression, no loss of interest in normal activity or change in sleep pattern.     Exam: chaperone present  BP 122/70   Ht 5' 0.5" (1.537 m)   Wt 175 lb (79.4 kg)   BMI 33.61 kg/m   Body mass index is 33.61 kg/m.  General appearance : Well developed well nourished female. No acute distress HEENT: Eyes: no retinal hemorrhage or exudates,  Neck supple, trachea midline, no carotid bruits, no thyroidmegaly Lungs: Clear to auscultation, no rhonchi or wheezes, or rib retractions  Heart: Regular rate and rhythm, no murmurs or gallops Breast:Examined in sitting and supine position were symmetrical in appearance, no palpable masses or tenderness,  no skin retraction, no nipple inversion, no nipple discharge, no skin discoloration, no axillary or supraclavicular lymphadenopathy Abdomen: no palpable masses or tenderness, no rebound or guarding Extremities: no edema or skin discoloration or tenderness  Pelvic:  Bartholin, Urethra, Skene Glands: Within normal limits             Vagina: Atrophic changes friable  Cervix: No gross lesions or discharge  Uterus  anteverted, normal size, shape and consistency, non-tender and mobile  Adnexa  Without masses or tenderness  Anus and perineum  normal   Rectovaginal  normal sphincter tone without palpated masses or tenderness  Hemoccult PCP will provide     Assessment/Plan:  70 y.o. female for annual exam with vaginal atrophy. We discussed different treatments option. She'll be started on Vagifem 10 g intravaginal tablet twice a week. Risk benefits and pros and cons were discussed. Literature information was provided. Also patient was provided with literature information on weight reduction exercises as well as diet that we had discussed today. We discussed importance of regular exercise. We discussed  importance of calcium vitamin D for osteoporosis prevention. Patient will need a bone density study next year. Her PCP has been doing her blood work.   Terrance Mass MD, 12:32 PM 09/10/2015

## 2015-09-24 DIAGNOSIS — F319 Bipolar disorder, unspecified: Secondary | ICD-10-CM | POA: Diagnosis not present

## 2015-09-24 DIAGNOSIS — E785 Hyperlipidemia, unspecified: Secondary | ICD-10-CM | POA: Diagnosis not present

## 2015-09-24 DIAGNOSIS — M79643 Pain in unspecified hand: Secondary | ICD-10-CM | POA: Diagnosis not present

## 2015-09-24 DIAGNOSIS — E039 Hypothyroidism, unspecified: Secondary | ICD-10-CM | POA: Diagnosis not present

## 2015-10-01 DIAGNOSIS — E785 Hyperlipidemia, unspecified: Secondary | ICD-10-CM | POA: Diagnosis not present

## 2015-10-01 DIAGNOSIS — E039 Hypothyroidism, unspecified: Secondary | ICD-10-CM | POA: Diagnosis not present

## 2015-10-01 DIAGNOSIS — R739 Hyperglycemia, unspecified: Secondary | ICD-10-CM | POA: Diagnosis not present

## 2015-10-01 DIAGNOSIS — F39 Unspecified mood [affective] disorder: Secondary | ICD-10-CM | POA: Diagnosis not present

## 2015-10-13 ENCOUNTER — Telehealth: Payer: Self-pay | Admitting: *Deleted

## 2015-10-13 NOTE — Telephone Encounter (Signed)
Studies have shown that this ultra low  10 g dose does not cause systemic absorption and no risk of hyperplasia or uterine cancer. If 1 tablet per week works that's okay

## 2015-10-13 NOTE — Telephone Encounter (Signed)
Pt informed with the below note. 

## 2015-10-13 NOTE — Telephone Encounter (Signed)
Pt was prescribed estradiol 10 mg vaginally tablets 09/10/15, expressed her concerns about the warning labels on medication such increase risk of uterine cancer, risk of heart attack , blood clots, etc. And that it should be used for the shortness time as possible. She asked could she use 1 tablet once weekly rather than twice weekly? Please advise

## 2015-11-17 DIAGNOSIS — E119 Type 2 diabetes mellitus without complications: Secondary | ICD-10-CM | POA: Diagnosis not present

## 2015-12-01 DIAGNOSIS — Z1231 Encounter for screening mammogram for malignant neoplasm of breast: Secondary | ICD-10-CM | POA: Diagnosis not present

## 2015-12-05 DIAGNOSIS — R922 Inconclusive mammogram: Secondary | ICD-10-CM | POA: Diagnosis not present

## 2015-12-05 DIAGNOSIS — R928 Other abnormal and inconclusive findings on diagnostic imaging of breast: Secondary | ICD-10-CM | POA: Diagnosis not present

## 2015-12-08 DIAGNOSIS — L219 Seborrheic dermatitis, unspecified: Secondary | ICD-10-CM | POA: Diagnosis not present

## 2016-01-20 DIAGNOSIS — M1180 Other specified crystal arthropathies, unspecified site: Secondary | ICD-10-CM | POA: Diagnosis not present

## 2016-01-20 DIAGNOSIS — M7989 Other specified soft tissue disorders: Secondary | ICD-10-CM | POA: Diagnosis not present

## 2016-01-20 DIAGNOSIS — M79643 Pain in unspecified hand: Secondary | ICD-10-CM | POA: Diagnosis not present

## 2016-01-20 DIAGNOSIS — M1990 Unspecified osteoarthritis, unspecified site: Secondary | ICD-10-CM | POA: Diagnosis not present

## 2016-03-31 DIAGNOSIS — E785 Hyperlipidemia, unspecified: Secondary | ICD-10-CM | POA: Diagnosis not present

## 2016-03-31 DIAGNOSIS — M19042 Primary osteoarthritis, left hand: Secondary | ICD-10-CM | POA: Diagnosis not present

## 2016-03-31 DIAGNOSIS — R739 Hyperglycemia, unspecified: Secondary | ICD-10-CM | POA: Diagnosis not present

## 2016-03-31 DIAGNOSIS — M19041 Primary osteoarthritis, right hand: Secondary | ICD-10-CM | POA: Diagnosis not present

## 2016-03-31 DIAGNOSIS — E039 Hypothyroidism, unspecified: Secondary | ICD-10-CM | POA: Diagnosis not present

## 2016-03-31 DIAGNOSIS — F39 Unspecified mood [affective] disorder: Secondary | ICD-10-CM | POA: Diagnosis not present

## 2016-03-31 DIAGNOSIS — M79643 Pain in unspecified hand: Secondary | ICD-10-CM | POA: Diagnosis not present

## 2016-04-07 DIAGNOSIS — F39 Unspecified mood [affective] disorder: Secondary | ICD-10-CM | POA: Diagnosis not present

## 2016-04-07 DIAGNOSIS — E7800 Pure hypercholesterolemia, unspecified: Secondary | ICD-10-CM | POA: Diagnosis not present

## 2016-04-07 DIAGNOSIS — Z0000 Encounter for general adult medical examination without abnormal findings: Secondary | ICD-10-CM | POA: Diagnosis not present

## 2016-04-07 DIAGNOSIS — E039 Hypothyroidism, unspecified: Secondary | ICD-10-CM | POA: Diagnosis not present

## 2016-04-07 DIAGNOSIS — R739 Hyperglycemia, unspecified: Secondary | ICD-10-CM | POA: Diagnosis not present

## 2016-05-05 DIAGNOSIS — M1180 Other specified crystal arthropathies, unspecified site: Secondary | ICD-10-CM | POA: Diagnosis not present

## 2016-05-05 DIAGNOSIS — M79643 Pain in unspecified hand: Secondary | ICD-10-CM | POA: Diagnosis not present

## 2016-05-05 DIAGNOSIS — M7989 Other specified soft tissue disorders: Secondary | ICD-10-CM | POA: Diagnosis not present

## 2016-05-05 DIAGNOSIS — M1990 Unspecified osteoarthritis, unspecified site: Secondary | ICD-10-CM | POA: Diagnosis not present

## 2016-06-07 ENCOUNTER — Other Ambulatory Visit: Payer: Self-pay | Admitting: Gynecology

## 2016-06-30 ENCOUNTER — Encounter: Payer: Self-pay | Admitting: Gynecology

## 2016-07-20 ENCOUNTER — Other Ambulatory Visit: Payer: Self-pay | Admitting: Gynecology

## 2016-09-10 ENCOUNTER — Ambulatory Visit (INDEPENDENT_AMBULATORY_CARE_PROVIDER_SITE_OTHER): Payer: Medicare Other | Admitting: Gynecology

## 2016-09-10 ENCOUNTER — Encounter: Payer: Self-pay | Admitting: Gynecology

## 2016-09-10 VITALS — BP 130/80 | Ht 61.0 in | Wt 184.0 lb

## 2016-09-10 DIAGNOSIS — M8580 Other specified disorders of bone density and structure, unspecified site: Secondary | ICD-10-CM

## 2016-09-10 DIAGNOSIS — Z01419 Encounter for gynecological examination (general) (routine) without abnormal findings: Secondary | ICD-10-CM | POA: Diagnosis not present

## 2016-09-10 MED ORDER — ESTRADIOL 10 MCG VA TABS: 10.0000 ug | ORAL_TABLET | VAGINAL | 4 refills | Status: DC

## 2016-09-10 NOTE — Progress Notes (Signed)
[name] [name] July 18, 1945 [id number]   History:    71 y.o.  for annual gyn exam with no complaints today. Patient has done well on Vagifem 10 g twice a week for vaginal atrophy. Patient with no vaginal bleeding reported. In 2015 patient had resectoscopic polypectomy for benign endometrial polyp and has done well. Her PCP is Dr. Megan Salon who is been doing her blood work. Her vaccines are up-to-date. Patient had a normal colonoscopy in 2013 she's on a 5 year recall because her father had history of colon cancer. Patient with no previous history of any abnormal Pap smear. Review of patient's bone density study in 2016 demonstrated that the lowest T score was at the left femoral neck with a value of -2.2 it was done at her internist office and I did not see a Frax analysis. We'll requested with her next scan this year.  Past medical history,surgical history, family history and social history were all reviewed and documented in the EPIC chart.  Gynecologic History No LMP recorded. Patient is postmenopausal. Contraception: post menopausal status Last Pap: 2014. Results were: normal Last mammogram: 2017. Results were: normal  Obstetric History OB History  Gravida Para Term Preterm AB Living  2 2       2   SAB TAB Ectopic Multiple Live Births               # Outcome Date GA Lbr Len/2nd Weight Sex Delivery Anes PTL Lv  2 Para      Vag-Spont     1 Para      Vag-Spont          ROS: A ROS was performed and pertinent positives and negatives are included in the history.  GENERAL: No fevers or chills. HEENT: No change in vision, no earache, sore throat or sinus congestion. NECK: No pain or stiffness. CARDIOVASCULAR: No chest pain or pressure. No palpitations. PULMONARY: No shortness of breath, cough or wheeze. GASTROINTESTINAL: No abdominal pain, nausea, vomiting or diarrhea, melena or bright red blood per rectum. GENITOURINARY: No urinary frequency, urgency, hesitancy or dysuria. MUSCULOSKELETAL:  No joint or muscle pain, no back pain, no recent trauma. DERMATOLOGIC: No rash, no itching, no lesions. ENDOCRINE: No polyuria, polydipsia, no heat or cold intolerance. No recent change in weight. HEMATOLOGICAL: No anemia or easy bruising or bleeding. NEUROLOGIC: No headache, seizures, numbness, tingling or weakness. PSYCHIATRIC: No depression, no loss of interest in normal activity or change in sleep pattern.     Exam: chaperone present  BP 130/80   Ht 5\' 1"  (1.549 m)   Wt 184 lb (83.5 kg)   BMI 34.77 kg/m   Body mass index is 34.77 kg/m.  General appearance : Well developed well nourished female. No acute distress HEENT: Eyes: no retinal hemorrhage or exudates,  Neck supple, trachea midline, no carotid bruits, no thyroidmegaly Lungs: Clear to auscultation, no rhonchi or wheezes, or rib retractions  Heart: Regular rate and rhythm, no murmurs or gallops Breast:Examined in sitting and supine position were symmetrical in appearance, no palpable masses or tenderness,  no skin retraction, no nipple inversion, no nipple discharge, no skin discoloration, no axillary or supraclavicular lymphadenopathy Abdomen: no palpable masses or tenderness, no rebound or guarding Extremities: no edema or skin discoloration or tenderness  Pelvic:  Bartholin, Urethra, Skene Glands: Within normal limits             Vagina: No gross lesions or discharge  Cervix: No gross lesions or discharge  Uterus  anteverted,  normal size, shape and consistency, non-tender and mobile  Adnexa  Without masses or tenderness  Anus and perineum  normal   Rectovaginal  normal sphincter tone without palpated masses or tenderness             Hemoccult needs colonoscopy this year     Assessment/Plan:  71 y.o. female for annual exam doing well and Vagifem 10 g twice a week intravaginal. Patient due for bone density study. I've given her instruction to discuss with her bone densitometry [place] to request a Frax analysis with her next  bone density study since I did not C1 and the report from 2016. Because of her history of osteopenia we'll check a vitamin D level today. She is also to contact her gastroenterologist since she is on a 5 year recall because of her father's history of colorectal cancer and her last colonoscopy was 5 years ago. We discussed importance of calcium vitamin D and weightbearing exercises for osteoporosis prevention. Pap smear no longer indicated.   Terrance Mass MD, 12:32 PM 09/10/2016

## 2016-09-10 NOTE — Patient Instructions (Signed)
Bone Densitometry Bone densitometry is an imaging test that uses a special X-ray to measure the amount of calcium and other minerals in your bones (bone density). This test is also known as a bone mineral density test or dual-energy X-ray absorptiometry (DXA). The test can measure bone density at your hip and your spine. It is similar to having a regular X-ray. You may have this test to:  Diagnose a condition that causes weak or thin bones (osteoporosis).  Predict your risk of a broken bone (fracture).  Determine how well osteoporosis treatment is working.  Tell a health care provider about:  Any allergies you have.  All medicines you are taking, including vitamins, herbs, eye drops, creams, and over-the-counter medicines.  Any problems you or family members have had with anesthetic medicines.  Any blood disorders you have.  Any surgeries you have had.  Any medical conditions you have.  Possibility of pregnancy.  Any other medical test you had within the previous 14 days that used contrast material. What are the risks? Generally, this is a safe procedure. However, problems can occur and may include the following:  This test exposes you to a very small amount of radiation.  The risks of radiation exposure may be greater to unborn children.  What happens before the procedure?  Do not take any calcium supplements for 24 hours before having the test. You can otherwise eat and drink what you usually do.  Take off all metal jewelry, eyeglasses, dental appliances, and any other metal objects. What happens during the procedure?  You may lie on an exam table. There will be an X-ray generator below you and an imaging device above you.  Other devices, such as boxes or braces, may be used to position your body properly for the scan.  You will need to lie still while the machine slowly scans your body.  The images will show up on a computer monitor. What happens after the  procedure? You may need more testing at a later time. This information is not intended to replace advice given to you by your health care provider. Make sure you discuss any questions you have with your health care provider. Document Released: 02/24/2004 Document Revised: 07/10/2015 Document Reviewed: 07/12/2013 Elsevier Interactive Patient Education  2018 Elsevier Inc.   

## 2016-09-11 LAB — VITAMIN D 25 HYDROXY (VIT D DEFICIENCY, FRACTURES): Vit D, 25-Hydroxy: 36 ng/mL (ref 30–100)

## 2016-09-29 DIAGNOSIS — R739 Hyperglycemia, unspecified: Secondary | ICD-10-CM | POA: Diagnosis not present

## 2016-09-29 DIAGNOSIS — E7800 Pure hypercholesterolemia, unspecified: Secondary | ICD-10-CM | POA: Diagnosis not present

## 2016-09-29 DIAGNOSIS — M810 Age-related osteoporosis without current pathological fracture: Secondary | ICD-10-CM | POA: Diagnosis not present

## 2016-09-29 DIAGNOSIS — F39 Unspecified mood [affective] disorder: Secondary | ICD-10-CM | POA: Diagnosis not present

## 2016-09-29 DIAGNOSIS — E039 Hypothyroidism, unspecified: Secondary | ICD-10-CM | POA: Diagnosis not present

## 2016-09-29 DIAGNOSIS — Z0000 Encounter for general adult medical examination without abnormal findings: Secondary | ICD-10-CM | POA: Diagnosis not present

## 2016-10-06 DIAGNOSIS — Z23 Encounter for immunization: Secondary | ICD-10-CM | POA: Diagnosis not present

## 2016-10-06 DIAGNOSIS — F39 Unspecified mood [affective] disorder: Secondary | ICD-10-CM | POA: Diagnosis not present

## 2016-10-06 DIAGNOSIS — E039 Hypothyroidism, unspecified: Secondary | ICD-10-CM | POA: Diagnosis not present

## 2016-10-06 DIAGNOSIS — R739 Hyperglycemia, unspecified: Secondary | ICD-10-CM | POA: Diagnosis not present

## 2016-10-06 DIAGNOSIS — Z0000 Encounter for general adult medical examination without abnormal findings: Secondary | ICD-10-CM | POA: Diagnosis not present

## 2016-10-12 ENCOUNTER — Encounter: Payer: Self-pay | Admitting: Internal Medicine

## 2016-11-26 DIAGNOSIS — Z23 Encounter for immunization: Secondary | ICD-10-CM | POA: Diagnosis not present

## 2016-12-01 DIAGNOSIS — E119 Type 2 diabetes mellitus without complications: Secondary | ICD-10-CM | POA: Diagnosis not present

## 2016-12-06 ENCOUNTER — Encounter: Payer: Self-pay | Admitting: Obstetrics & Gynecology

## 2016-12-06 DIAGNOSIS — Z1231 Encounter for screening mammogram for malignant neoplasm of breast: Secondary | ICD-10-CM | POA: Diagnosis not present

## 2016-12-16 ENCOUNTER — Telehealth: Payer: Self-pay | Admitting: *Deleted

## 2016-12-16 NOTE — Telephone Encounter (Signed)
Pt called and left message stating Yuvafem 10 mcg price has increased wanted to know if another medication can be prescribed. I called and left message on her voicemail asking her to contact insurance company for a formulary list with covered drugs and call me back with names and then I can send to [facility] to see if those medication would be an option.

## 2017-03-10 DIAGNOSIS — M160 Bilateral primary osteoarthritis of hip: Secondary | ICD-10-CM | POA: Diagnosis not present

## 2017-03-10 DIAGNOSIS — M25569 Pain in unspecified knee: Secondary | ICD-10-CM | POA: Diagnosis not present

## 2017-03-10 DIAGNOSIS — M25561 Pain in right knee: Secondary | ICD-10-CM | POA: Diagnosis not present

## 2017-03-10 DIAGNOSIS — M1990 Unspecified osteoarthritis, unspecified site: Secondary | ICD-10-CM | POA: Diagnosis not present

## 2017-03-10 DIAGNOSIS — M545 Low back pain: Secondary | ICD-10-CM | POA: Diagnosis not present

## 2017-03-10 DIAGNOSIS — M25559 Pain in unspecified hip: Secondary | ICD-10-CM | POA: Diagnosis not present

## 2017-03-10 DIAGNOSIS — M1180 Other specified crystal arthropathies, unspecified site: Secondary | ICD-10-CM | POA: Diagnosis not present

## 2017-03-10 DIAGNOSIS — M25562 Pain in left knee: Secondary | ICD-10-CM | POA: Diagnosis not present

## 2017-03-10 DIAGNOSIS — M25551 Pain in right hip: Secondary | ICD-10-CM | POA: Diagnosis not present

## 2017-03-10 DIAGNOSIS — M25552 Pain in left hip: Secondary | ICD-10-CM | POA: Diagnosis not present

## 2017-03-31 DIAGNOSIS — M25551 Pain in right hip: Secondary | ICD-10-CM | POA: Diagnosis not present

## 2017-03-31 DIAGNOSIS — M25561 Pain in right knee: Secondary | ICD-10-CM | POA: Diagnosis not present

## 2017-04-04 DIAGNOSIS — R739 Hyperglycemia, unspecified: Secondary | ICD-10-CM | POA: Diagnosis not present

## 2017-04-04 DIAGNOSIS — F39 Unspecified mood [affective] disorder: Secondary | ICD-10-CM | POA: Diagnosis not present

## 2017-04-04 DIAGNOSIS — E039 Hypothyroidism, unspecified: Secondary | ICD-10-CM | POA: Diagnosis not present

## 2017-04-04 DIAGNOSIS — Z0000 Encounter for general adult medical examination without abnormal findings: Secondary | ICD-10-CM | POA: Diagnosis not present

## 2017-04-11 DIAGNOSIS — E039 Hypothyroidism, unspecified: Secondary | ICD-10-CM | POA: Diagnosis not present

## 2017-04-11 DIAGNOSIS — Z0000 Encounter for general adult medical examination without abnormal findings: Secondary | ICD-10-CM | POA: Diagnosis not present

## 2017-04-11 DIAGNOSIS — F39 Unspecified mood [affective] disorder: Secondary | ICD-10-CM | POA: Diagnosis not present

## 2017-04-11 DIAGNOSIS — E7800 Pure hypercholesterolemia, unspecified: Secondary | ICD-10-CM | POA: Diagnosis not present

## 2017-04-13 DIAGNOSIS — M25551 Pain in right hip: Secondary | ICD-10-CM | POA: Diagnosis not present

## 2017-04-13 DIAGNOSIS — M25561 Pain in right knee: Secondary | ICD-10-CM | POA: Diagnosis not present

## 2017-04-20 DIAGNOSIS — M25561 Pain in right knee: Secondary | ICD-10-CM | POA: Diagnosis not present

## 2017-04-20 DIAGNOSIS — M25551 Pain in right hip: Secondary | ICD-10-CM | POA: Diagnosis not present

## 2017-04-22 DIAGNOSIS — M25561 Pain in right knee: Secondary | ICD-10-CM | POA: Diagnosis not present

## 2017-04-22 DIAGNOSIS — M25551 Pain in right hip: Secondary | ICD-10-CM | POA: Diagnosis not present

## 2017-04-27 DIAGNOSIS — M25551 Pain in right hip: Secondary | ICD-10-CM | POA: Diagnosis not present

## 2017-04-27 DIAGNOSIS — M25561 Pain in right knee: Secondary | ICD-10-CM | POA: Diagnosis not present

## 2017-06-03 DIAGNOSIS — L821 Other seborrheic keratosis: Secondary | ICD-10-CM | POA: Diagnosis not present

## 2017-06-03 DIAGNOSIS — L218 Other seborrheic dermatitis: Secondary | ICD-10-CM | POA: Diagnosis not present

## 2017-07-12 ENCOUNTER — Telehealth: Payer: Self-pay | Admitting: Internal Medicine

## 2017-07-12 NOTE — Telephone Encounter (Signed)
Patient found a soft pea sized material in her stool.  She is wanting to know what it is.  Patient advised most likely something she has eaten and she should dispose of it.  She wants to bring it in to be looked at .  She is advised that this is not possible.She denies any GI complaints.  She is advised that in the absence of any GI complaints she should let it go.

## 2017-09-14 ENCOUNTER — Encounter: Payer: Self-pay | Admitting: Obstetrics & Gynecology

## 2017-09-14 ENCOUNTER — Ambulatory Visit (INDEPENDENT_AMBULATORY_CARE_PROVIDER_SITE_OTHER): Payer: Medicare Other | Admitting: Obstetrics & Gynecology

## 2017-09-14 VITALS — BP 134/86 | Ht 60.5 in | Wt 185.0 lb

## 2017-09-14 DIAGNOSIS — E6609 Other obesity due to excess calories: Secondary | ICD-10-CM

## 2017-09-14 DIAGNOSIS — N952 Postmenopausal atrophic vaginitis: Secondary | ICD-10-CM

## 2017-09-14 DIAGNOSIS — Z01419 Encounter for gynecological examination (general) (routine) without abnormal findings: Secondary | ICD-10-CM

## 2017-09-14 DIAGNOSIS — Z6835 Body mass index (BMI) 35.0-35.9, adult: Secondary | ICD-10-CM | POA: Diagnosis not present

## 2017-09-14 DIAGNOSIS — Z124 Encounter for screening for malignant neoplasm of cervix: Secondary | ICD-10-CM

## 2017-09-14 DIAGNOSIS — Z780 Asymptomatic menopausal state: Secondary | ICD-10-CM

## 2017-09-14 MED ORDER — ESTRADIOL 10 MCG VA TABS: 10.0000 ug | ORAL_TABLET | VAGINAL | 4 refills | Status: DC

## 2017-09-14 NOTE — Patient Instructions (Signed)
  1. Encounter for routine gynecological examination with Papanicolaou smear of cervix Normal gynecologic exam.  Pap reflex done.  Breast exam normal.  Last screening mammogram in October 2018 was benign.  Health labs with family physician.  Normal colonoscopy in 2013, on a 5-year schedule because of her father's diagnosis of colon cancer.  Patient declines scheduling a colonoscopy at this time.  2. Menopause present Well on no systemic hormone replacement therapy.  No postmenopausal bleeding.  Bone density done in October 2018, will obtain report from [place].  Recommend vitamin D supplements, calcium rich nutrition and weightbearing physical activity.  3. Post-menopause atrophic vaginitis Patient doing well on Vagifem twice a week.  But given that she is abstinent, will try to decrease to once a week this year.  Vagifem prescription sent to the pharmacy.  4. 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  Physical activity with aerobic activities 5 times a week and weightlifting every 2 days recommended.  Other orders - Estradiol (YUVAFEM) 10 MCG TABS vaginal tablet; Place 1 tablet (10 mcg total) vaginally 2 (two) times a week.  [name], it was a pleasure meeting you today!  I will inform you of your results as soon as they are available.

## 2017-09-14 NOTE — Progress Notes (Signed)
[name] [name] [birth year] [id number]   History:    72 y.o. G1P1L1 Married.  Son 96 yo lives with them with his 72 yo daughter.  RP:  Established patient presenting for annual gyn exam   HPI: Menopause, well on no HRT.  Vagifem for atrophic vaginitis.  No PMB.  No pelvic pain.  Abstinent.  Urine/BMs wnl.  BMI 35.54.  Breasts wnl.  Health labs with Fam MD. normal colonoscopy in 2013, but was on a 5-year schedule because of her father's history of colon cancer.  Patient declines scheduling her colonoscopy at this time.  Past medical history,surgical history, family history and social history were all reviewed and documented in the EPIC chart.  Gynecologic History No LMP recorded. Patient is postmenopausal. Contraception: post menopausal status Last Pap: 2014. Results were: Negative Last mammogram: 11/2016. Results were: Benign Bone Density: 11/2016 per patient, will obtain from [place]. Colonoscopy: 2013.  Declines repeating Colonoscopy this year.  Father with h/o Colon Ca.  Obstetric History OB History  Gravida Para Term Preterm AB Living  2 2       2   SAB TAB Ectopic Multiple Live Births               # Outcome Date GA Lbr Len/2nd Weight Sex Delivery Anes PTL Lv  2 Para      Vag-Spont     1 Para      Vag-Spont        ROS: A ROS was performed and pertinent positives and negatives are included in the history.  GENERAL: No fevers or chills. HEENT: No change in vision, no earache, sore throat or sinus congestion. NECK: No pain or stiffness. CARDIOVASCULAR: No chest pain or pressure. No palpitations. PULMONARY: No shortness of breath, cough or wheeze. GASTROINTESTINAL: No abdominal pain, nausea, vomiting or diarrhea, melena or bright red blood per rectum. GENITOURINARY: No urinary frequency, urgency, hesitancy or dysuria. MUSCULOSKELETAL: No joint or muscle pain, no back pain, no recent trauma. DERMATOLOGIC: No rash, no itching, no lesions. ENDOCRINE: No polyuria, polydipsia, no heat or  cold intolerance. No recent change in weight. HEMATOLOGICAL: No anemia or easy bruising or bleeding. NEUROLOGIC: No headache, seizures, numbness, tingling or weakness. PSYCHIATRIC: No depression, no loss of interest in normal activity or change in sleep pattern.     Exam:   BP 134/86   Ht 5' 0.5" (1.537 m)   Wt 185 lb (83.9 kg)   BMI 35.54 kg/m   Body mass index is 35.54 kg/m.  General appearance : Well developed well nourished female. No acute distress HEENT: Eyes: no retinal hemorrhage or exudates,  Neck supple, trachea midline, no carotid bruits, no thyroidmegaly Lungs: Clear to auscultation, no rhonchi or wheezes, or rib retractions  Heart: Regular rate and rhythm, no murmurs or gallops Breast:Examined in sitting and supine position were symmetrical in appearance, no palpable masses or tenderness,  no skin retraction, no nipple inversion, no nipple discharge, no skin discoloration, no axillary or supraclavicular lymphadenopathy Abdomen: no palpable masses or tenderness, no rebound or guarding Extremities: no edema or skin discoloration or tenderness  Pelvic: Vulva: Normal             Vagina: No gross lesions or discharge  Cervix: No gross lesions or discharge.  Pap reflex done.  Uterus  AV, normal size, shape and consistency, non-tender and mobile  Adnexa  Without masses or tenderness  Anus: Normal   Assessment/Plan:  72 y.o. female for annual exam   1. Encounter  for routine gynecological examination with Papanicolaou smear of cervix Normal gynecologic exam.  Pap reflex done.  Breast exam normal.  Last screening mammogram in October 2018 was benign.  Health labs with family physician.  Normal colonoscopy in 2013, on a 5-year schedule because of her father's diagnosis of colon cancer.  Patient declines scheduling a colonoscopy at this time.  2. Menopause present Well on no systemic hormone replacement therapy.  No postmenopausal bleeding.  Bone density done in October 2018,  will obtain report from [place].  Recommend vitamin D supplements, calcium rich nutrition and weightbearing physical activity.  3. Post-menopause atrophic vaginitis Patient doing well on Vagifem twice a week.  But given that she is abstinent, will try to decrease to once a week this year.  Vagifem prescription sent to the pharmacy.  4. 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  Physical activity with aerobic activities 5 times a week and weightlifting every 2 days recommended.  Other orders - Estradiol (YUVAFEM) 10 MCG TABS vaginal tablet; Place 1 tablet (10 mcg total) vaginally 2 (two) times a week.  Counseling on above issues and coordination of care more than 50% for 10 minutes.  Princess Bruins MD, 11:41 AM 09/14/2017

## 2017-09-15 LAB — PAP IG W/ RFLX HPV ASCU

## 2017-10-06 DIAGNOSIS — E039 Hypothyroidism, unspecified: Secondary | ICD-10-CM | POA: Diagnosis not present

## 2017-10-06 DIAGNOSIS — Z0000 Encounter for general adult medical examination without abnormal findings: Secondary | ICD-10-CM | POA: Diagnosis not present

## 2017-10-06 DIAGNOSIS — E7800 Pure hypercholesterolemia, unspecified: Secondary | ICD-10-CM | POA: Diagnosis not present

## 2017-10-06 DIAGNOSIS — N390 Urinary tract infection, site not specified: Secondary | ICD-10-CM | POA: Diagnosis not present

## 2017-10-13 DIAGNOSIS — E785 Hyperlipidemia, unspecified: Secondary | ICD-10-CM | POA: Diagnosis not present

## 2017-10-13 DIAGNOSIS — F39 Unspecified mood [affective] disorder: Secondary | ICD-10-CM | POA: Diagnosis not present

## 2017-10-13 DIAGNOSIS — Z0000 Encounter for general adult medical examination without abnormal findings: Secondary | ICD-10-CM | POA: Diagnosis not present

## 2017-10-13 DIAGNOSIS — M0689 Other specified rheumatoid arthritis, multiple sites: Secondary | ICD-10-CM | POA: Diagnosis not present

## 2017-10-19 DIAGNOSIS — F39 Unspecified mood [affective] disorder: Secondary | ICD-10-CM | POA: Diagnosis not present

## 2017-10-19 DIAGNOSIS — I10 Essential (primary) hypertension: Secondary | ICD-10-CM | POA: Diagnosis not present

## 2017-10-19 DIAGNOSIS — M0689 Other specified rheumatoid arthritis, multiple sites: Secondary | ICD-10-CM | POA: Diagnosis not present

## 2017-10-19 DIAGNOSIS — E785 Hyperlipidemia, unspecified: Secondary | ICD-10-CM | POA: Diagnosis not present

## 2017-11-25 DIAGNOSIS — M7989 Other specified soft tissue disorders: Secondary | ICD-10-CM | POA: Diagnosis not present

## 2017-11-25 DIAGNOSIS — R0602 Shortness of breath: Secondary | ICD-10-CM | POA: Diagnosis not present

## 2017-11-30 DIAGNOSIS — E119 Type 2 diabetes mellitus without complications: Secondary | ICD-10-CM | POA: Diagnosis not present

## 2017-12-07 ENCOUNTER — Other Ambulatory Visit (HOSPITAL_COMMUNITY): Payer: Self-pay | Admitting: Family Medicine

## 2017-12-07 DIAGNOSIS — R609 Edema, unspecified: Secondary | ICD-10-CM

## 2017-12-07 DIAGNOSIS — R0602 Shortness of breath: Secondary | ICD-10-CM

## 2017-12-07 DIAGNOSIS — Z1231 Encounter for screening mammogram for malignant neoplasm of breast: Secondary | ICD-10-CM | POA: Diagnosis not present

## 2017-12-08 ENCOUNTER — Other Ambulatory Visit: Payer: Self-pay

## 2017-12-08 ENCOUNTER — Ambulatory Visit (HOSPITAL_COMMUNITY): Payer: Medicare Other | Attending: Internal Medicine

## 2017-12-08 DIAGNOSIS — R0602 Shortness of breath: Secondary | ICD-10-CM

## 2017-12-08 DIAGNOSIS — R609 Edema, unspecified: Secondary | ICD-10-CM

## 2017-12-14 DIAGNOSIS — I5030 Unspecified diastolic (congestive) heart failure: Secondary | ICD-10-CM | POA: Diagnosis not present

## 2017-12-14 DIAGNOSIS — M7989 Other specified soft tissue disorders: Secondary | ICD-10-CM | POA: Diagnosis not present

## 2018-04-12 DIAGNOSIS — E7800 Pure hypercholesterolemia, unspecified: Secondary | ICD-10-CM | POA: Diagnosis not present

## 2018-04-12 DIAGNOSIS — R739 Hyperglycemia, unspecified: Secondary | ICD-10-CM | POA: Diagnosis not present

## 2018-04-12 DIAGNOSIS — E039 Hypothyroidism, unspecified: Secondary | ICD-10-CM | POA: Diagnosis not present

## 2018-04-19 DIAGNOSIS — I5030 Unspecified diastolic (congestive) heart failure: Secondary | ICD-10-CM | POA: Diagnosis not present

## 2018-04-19 DIAGNOSIS — I10 Essential (primary) hypertension: Secondary | ICD-10-CM | POA: Diagnosis not present

## 2018-04-19 DIAGNOSIS — E785 Hyperlipidemia, unspecified: Secondary | ICD-10-CM | POA: Diagnosis not present

## 2018-04-19 DIAGNOSIS — R739 Hyperglycemia, unspecified: Secondary | ICD-10-CM | POA: Diagnosis not present

## 2018-07-19 DIAGNOSIS — R739 Hyperglycemia, unspecified: Secondary | ICD-10-CM | POA: Diagnosis not present

## 2018-07-19 DIAGNOSIS — E039 Hypothyroidism, unspecified: Secondary | ICD-10-CM | POA: Diagnosis not present

## 2018-07-19 DIAGNOSIS — E785 Hyperlipidemia, unspecified: Secondary | ICD-10-CM | POA: Diagnosis not present

## 2018-07-19 DIAGNOSIS — I10 Essential (primary) hypertension: Secondary | ICD-10-CM | POA: Diagnosis not present

## 2018-07-26 DIAGNOSIS — E039 Hypothyroidism, unspecified: Secondary | ICD-10-CM | POA: Diagnosis not present

## 2018-07-26 DIAGNOSIS — E7800 Pure hypercholesterolemia, unspecified: Secondary | ICD-10-CM | POA: Diagnosis not present

## 2018-07-26 DIAGNOSIS — I5030 Unspecified diastolic (congestive) heart failure: Secondary | ICD-10-CM | POA: Diagnosis not present

## 2018-07-26 DIAGNOSIS — Z5181 Encounter for therapeutic drug level monitoring: Secondary | ICD-10-CM | POA: Diagnosis not present

## 2018-07-26 DIAGNOSIS — I10 Essential (primary) hypertension: Secondary | ICD-10-CM | POA: Diagnosis not present

## 2018-08-23 ENCOUNTER — Other Ambulatory Visit: Payer: Self-pay

## 2018-08-23 MED ORDER — ESTRADIOL 10 MCG VA TABS: 10.0000 ug | ORAL_TABLET | VAGINAL | 0 refills | Status: DC

## 2018-08-25 ENCOUNTER — Other Ambulatory Visit: Payer: Self-pay | Admitting: Obstetrics & Gynecology

## 2018-09-12 DIAGNOSIS — N183 Chronic kidney disease, stage 3 (moderate): Secondary | ICD-10-CM | POA: Diagnosis not present

## 2018-09-12 DIAGNOSIS — J029 Acute pharyngitis, unspecified: Secondary | ICD-10-CM | POA: Diagnosis not present

## 2018-09-21 ENCOUNTER — Encounter: Payer: Medicare Other | Admitting: Obstetrics & Gynecology

## 2018-10-05 ENCOUNTER — Encounter: Payer: Medicare Other | Admitting: Obstetrics & Gynecology

## 2018-10-18 DIAGNOSIS — Z5181 Encounter for therapeutic drug level monitoring: Secondary | ICD-10-CM | POA: Diagnosis not present

## 2018-10-18 DIAGNOSIS — R739 Hyperglycemia, unspecified: Secondary | ICD-10-CM | POA: Diagnosis not present

## 2018-10-18 DIAGNOSIS — N183 Chronic kidney disease, stage 3 (moderate): Secondary | ICD-10-CM | POA: Diagnosis not present

## 2018-10-18 DIAGNOSIS — I10 Essential (primary) hypertension: Secondary | ICD-10-CM | POA: Diagnosis not present

## 2018-10-18 DIAGNOSIS — Z23 Encounter for immunization: Secondary | ICD-10-CM | POA: Diagnosis not present

## 2018-10-18 DIAGNOSIS — E7800 Pure hypercholesterolemia, unspecified: Secondary | ICD-10-CM | POA: Diagnosis not present

## 2018-10-25 DIAGNOSIS — I5030 Unspecified diastolic (congestive) heart failure: Secondary | ICD-10-CM | POA: Diagnosis not present

## 2018-10-25 DIAGNOSIS — N183 Chronic kidney disease, stage 3 (moderate): Secondary | ICD-10-CM | POA: Diagnosis not present

## 2018-10-25 DIAGNOSIS — E785 Hyperlipidemia, unspecified: Secondary | ICD-10-CM | POA: Diagnosis not present

## 2018-10-25 DIAGNOSIS — Z0000 Encounter for general adult medical examination without abnormal findings: Secondary | ICD-10-CM | POA: Diagnosis not present

## 2018-12-08 ENCOUNTER — Encounter: Payer: Medicare Other | Admitting: Obstetrics & Gynecology

## 2018-12-11 DIAGNOSIS — Z1231 Encounter for screening mammogram for malignant neoplasm of breast: Secondary | ICD-10-CM | POA: Diagnosis not present

## 2018-12-14 DIAGNOSIS — H5213 Myopia, bilateral: Secondary | ICD-10-CM | POA: Diagnosis not present

## 2018-12-21 ENCOUNTER — Ambulatory Visit: Payer: Medicare Other | Admitting: Dietician

## 2019-01-05 ENCOUNTER — Other Ambulatory Visit: Payer: Self-pay

## 2019-01-08 ENCOUNTER — Other Ambulatory Visit: Payer: Self-pay

## 2019-01-08 ENCOUNTER — Ambulatory Visit (INDEPENDENT_AMBULATORY_CARE_PROVIDER_SITE_OTHER): Payer: Medicare Other | Admitting: Obstetrics & Gynecology

## 2019-01-08 ENCOUNTER — Encounter: Payer: Self-pay | Admitting: Obstetrics & Gynecology

## 2019-01-08 VITALS — BP 126/82 | Ht 60.0 in | Wt 188.0 lb

## 2019-01-08 DIAGNOSIS — Z6836 Body mass index (BMI) 36.0-36.9, adult: Secondary | ICD-10-CM

## 2019-01-08 DIAGNOSIS — Z780 Asymptomatic menopausal state: Secondary | ICD-10-CM

## 2019-01-08 DIAGNOSIS — E661 Drug-induced obesity: Secondary | ICD-10-CM

## 2019-01-08 DIAGNOSIS — Z01419 Encounter for gynecological examination (general) (routine) without abnormal findings: Secondary | ICD-10-CM | POA: Diagnosis not present

## 2019-01-08 NOTE — Patient Instructions (Signed)
1. Well female exam with routine gynecological exam Normal gynecologic exam in menopause.  Pap test July 2019 was negative, no indication to repeat this year.  Breast exam normal.  Screening mammogram October 2020 was normal at [facility]  Overdue for screening colonoscopy, will schedule now.  Father with colon cancer.  Health labs with family physician.    2. Postmenopause Well on no hormone replacement therapy.  No postmenopausal bleeding.  Was using Vagifem, recommend to stop at this point.  Patient is abstinent.  May use coconut oil if feels dryness vaginally.  Scheduling bone density through her family physician.  Recent vitamin D level was on the low side at 28.7.  Patient will increase her vitamin D supplements from 1000 to 2000 international unit daily.  Calcium intake of 1200 mg daily recommended.  Regular weightbearing physical activity is recommended.  3. Class 2 drug-induced obesity with serious comorbidity and body mass index (BMI) of 36.0 to 36.9 in adult Recommend a lower calorie/carb diet such as [employer].  Aerobic activities 5 times a week and small weightlifting every 2 days.  Other orders - furosemide (LASIX) 40 MG tablet; Take 40 mg by mouth. - Potassium 75 MG TABS; Take by mouth.  [name] [name], it was a pleasure seeing you today!

## 2019-01-08 NOTE — Progress Notes (Signed)
[name] [name] [birth year] [id number]   History:    73 y.o.  G1P1L1 Married.  Son 54 yo lives with them with his 21 yo daughter.  RP:  Established patient presenting for annual gyn exam   HPI: Menopause, well on no HRT.  No PMB.  No pelvic pain.  Vagifem every 2 weeks for atrophic vaginitis.  No PMB.  No pelvic pain.  Abstinent.  Urine/BMs wnl.  Breasts normal.  BMI 36.72.  Not physically active.  Normal colonoscopy in 2013, but was on a 5-year schedule because of her father's history of colon cancer.   Health labs with Fam MD.  Past medical history,surgical history, family history and social history were all reviewed and documented in the EPIC chart.  Gynecologic History No LMP recorded. Patient is postmenopausal. Contraception: abstinence and post menopausal status Last Pap: 08/2017. Results were: Negative Last mammogram: 11/2018. Results were: Normal at [facility] Bone Density: 2016.  Scheduling through her Fam MD. Colonoscopy: 2013.  Overdue because of Father with Colon Ca.  Will schedule now.  Obstetric History OB History  Gravida Para Term Preterm AB Living  2 2       2   SAB TAB Ectopic Multiple Live Births               # Outcome Date GA Lbr Len/2nd Weight Sex Delivery Anes PTL Lv  2 Para      Vag-Spont     1 Para      Vag-Spont        ROS: A ROS was performed and pertinent positives and negatives are included in the history.  GENERAL: No fevers or chills. HEENT: No change in vision, no earache, sore throat or sinus congestion. NECK: No pain or stiffness. CARDIOVASCULAR: No chest pain or pressure. No palpitations. PULMONARY: No shortness of breath, cough or wheeze. GASTROINTESTINAL: No abdominal pain, nausea, vomiting or diarrhea, melena or bright red blood per rectum. GENITOURINARY: No urinary frequency, urgency, hesitancy or dysuria. MUSCULOSKELETAL: No joint or muscle pain, no back pain, no recent trauma. DERMATOLOGIC: No rash, no itching, no lesions. ENDOCRINE: No polyuria,  polydipsia, no heat or cold intolerance. No recent change in weight. HEMATOLOGICAL: No anemia or easy bruising or bleeding. NEUROLOGIC: No headache, seizures, numbness, tingling or weakness. PSYCHIATRIC: No depression, no loss of interest in normal activity or change in sleep pattern.     Exam:   BP 126/82   Ht 5' (1.524 m)   Wt 188 lb (85.3 kg)   BMI 36.72 kg/m   Body mass index is 36.72 kg/m.  General appearance : Well developed well nourished female. No acute distress HEENT: Eyes: no retinal hemorrhage or exudates,  Neck supple, trachea midline, no carotid bruits, no thyroidmegaly Lungs: Clear to auscultation, no rhonchi or wheezes, or rib retractions  Heart: Regular rate and rhythm, no murmurs or gallops Breast:Examined in sitting and supine position were symmetrical in appearance, no palpable masses or tenderness,  no skin retraction, no nipple inversion, no nipple discharge, no skin discoloration, no axillary or supraclavicular lymphadenopathy Abdomen: no palpable masses or tenderness, no rebound or guarding Extremities: no edema or skin discoloration or tenderness  Pelvic: Vulva: Normal             Vagina: No gross lesions or discharge  Cervix: No gross lesions or discharge  Uterus  AV, normal size, shape and consistency, non-tender and mobile  Adnexa  Without masses or tenderness  Anus: Normal   Assessment/Plan:  73 y.o. female  for annual exam   1. Well female exam with routine gynecological exam Normal gynecologic exam in menopause.  Pap test July 2019 was negative, no indication to repeat this year.  Breast exam normal.  Screening mammogram October 2020 was normal at [facility].  Overdue for screening colonoscopy, will schedule now.  Father with colon cancer.  Health labs with family physician.    2. Postmenopause Well on no hormone replacement therapy.  No postmenopausal bleeding.  Was using Vagifem, recommend to stop at this point.  Patient is abstinent.  May use coconut  oil if feels dryness vaginally.  Scheduling bone density through her family physician.  Recent vitamin D level was on the low side at 28.7.  Patient will increase her vitamin D supplements from 1000 to 2000 international unit daily.  Calcium intake of 1200 mg daily recommended.  Regular weightbearing physical activity is recommended.  3. Class 2 drug-induced obesity with serious comorbidity and body mass index (BMI) of 36.0 to 36.9 in adult Recommend a lower calorie/carb diet such as [employer].  Aerobic activities 5 times a week and small weightlifting every 2 days.  Other orders - furosemide (LASIX) 40 MG tablet; Take 40 mg by mouth. - Potassium 75 MG TABS; Take by mouth.  Princess Bruins MD, 4:41 PM 01/08/2019

## 2019-01-25 DIAGNOSIS — R7309 Other abnormal glucose: Secondary | ICD-10-CM | POA: Diagnosis not present

## 2019-01-25 DIAGNOSIS — E039 Hypothyroidism, unspecified: Secondary | ICD-10-CM | POA: Diagnosis not present

## 2019-01-25 DIAGNOSIS — F39 Unspecified mood [affective] disorder: Secondary | ICD-10-CM | POA: Diagnosis not present

## 2019-01-25 DIAGNOSIS — E785 Hyperlipidemia, unspecified: Secondary | ICD-10-CM | POA: Diagnosis not present

## 2019-01-25 DIAGNOSIS — N183 Chronic kidney disease, stage 3 unspecified: Secondary | ICD-10-CM | POA: Diagnosis not present

## 2019-01-25 DIAGNOSIS — Z780 Asymptomatic menopausal state: Secondary | ICD-10-CM | POA: Diagnosis not present

## 2019-02-15 DIAGNOSIS — F39 Unspecified mood [affective] disorder: Secondary | ICD-10-CM | POA: Diagnosis not present

## 2019-02-15 DIAGNOSIS — R7309 Other abnormal glucose: Secondary | ICD-10-CM | POA: Diagnosis not present

## 2019-02-15 DIAGNOSIS — N183 Chronic kidney disease, stage 3 unspecified: Secondary | ICD-10-CM | POA: Diagnosis not present

## 2019-02-15 DIAGNOSIS — E785 Hyperlipidemia, unspecified: Secondary | ICD-10-CM | POA: Diagnosis not present

## 2019-03-19 ENCOUNTER — Ambulatory Visit: Payer: Medicare Other | Admitting: Skilled Nursing Facility1

## 2019-03-31 ENCOUNTER — Ambulatory Visit: Payer: Medicare Other | Attending: Internal Medicine

## 2019-03-31 DIAGNOSIS — Z23 Encounter for immunization: Secondary | ICD-10-CM | POA: Insufficient documentation

## 2019-03-31 NOTE — Progress Notes (Signed)
   Covid-19 Vaccination Clinic  Name:  [name] [name]    MRN: [id number] DOB: [birth year]  03/31/2019  [name] was observed post Covid-19 immunization for 15 minutes without incidence. She was provided with Vaccine Information Sheet and instruction to access the V-Safe system.   [name] was instructed to call 911 with any severe reactions post vaccine: [name] Difficulty breathing  . Swelling of your face and throat  . A fast heartbeat  . A bad rash all over your body  . Dizziness and weakness    Immunizations Administered    Name Date Dose VIS Date Route   Pfizer COVID-19 Vaccine 03/31/2019  1:42 PM 0.3 mL 01/26/2019 Intramuscular   Manufacturer: [place]   Lot: [id number]   [place]: [id number]

## 2019-04-23 ENCOUNTER — Ambulatory Visit: Payer: Medicare Other | Attending: Internal Medicine

## 2019-04-23 DIAGNOSIS — Z23 Encounter for immunization: Secondary | ICD-10-CM | POA: Insufficient documentation

## 2019-04-23 NOTE — Progress Notes (Signed)
   Covid-19 Vaccination Clinic  Name:  [name] [name]    MRN: [id number] DOB: Jul 08, 1945  04/23/2019  [name] was observed post Covid-19 immunization for 15 minutes without incident. She was provided with Vaccine Information Sheet and instruction to access the V-Safe system.   [name] was instructed to call 911 with any severe reactions post vaccine: [name] Difficulty breathing  . Swelling of face and throat  . A fast heartbeat  . A bad rash all over body  . Dizziness and weakness   Immunizations Administered    Name Date Dose VIS Date Route   Pfizer COVID-19 Vaccine 04/23/2019 12:41 PM 0.3 mL 01/26/2019 Intramuscular   Manufacturer: [place]   Lot: [id number]   [place]: [id number]

## 2019-07-12 DIAGNOSIS — R928 Other abnormal and inconclusive findings on diagnostic imaging of breast: Secondary | ICD-10-CM | POA: Diagnosis not present

## 2019-07-12 DIAGNOSIS — N6312 Unspecified lump in the right breast, upper inner quadrant: Secondary | ICD-10-CM | POA: Diagnosis not present

## 2019-07-17 ENCOUNTER — Encounter: Payer: Self-pay | Admitting: Anesthesiology

## 2019-07-17 DIAGNOSIS — C801 Malignant (primary) neoplasm, unspecified: Secondary | ICD-10-CM

## 2019-07-17 HISTORY — DX: Malignant (primary) neoplasm, unspecified: C80.1

## 2019-07-25 ENCOUNTER — Other Ambulatory Visit: Payer: Self-pay | Admitting: Radiology

## 2019-07-25 DIAGNOSIS — C50211 Malignant neoplasm of upper-inner quadrant of right female breast: Secondary | ICD-10-CM | POA: Diagnosis not present

## 2019-07-25 DIAGNOSIS — N6312 Unspecified lump in the right breast, upper inner quadrant: Secondary | ICD-10-CM | POA: Diagnosis not present

## 2019-07-26 DIAGNOSIS — R928 Other abnormal and inconclusive findings on diagnostic imaging of breast: Secondary | ICD-10-CM | POA: Diagnosis not present

## 2019-07-27 ENCOUNTER — Telehealth: Payer: Self-pay | Admitting: Oncology

## 2019-07-27 ENCOUNTER — Encounter: Payer: Self-pay | Admitting: Anesthesiology

## 2019-07-27 NOTE — Telephone Encounter (Signed)
Spoke with patient to confirm PM clinic on 6/16, Teola Bradley will send packet

## 2019-07-30 ENCOUNTER — Encounter: Payer: Self-pay | Admitting: *Deleted

## 2019-07-30 DIAGNOSIS — Z170 Estrogen receptor positive status [ER+]: Secondary | ICD-10-CM

## 2019-08-01 ENCOUNTER — Encounter: Payer: Self-pay | Admitting: *Deleted

## 2019-08-01 ENCOUNTER — Inpatient Hospital Stay: Payer: Medicare Other | Attending: Oncology | Admitting: Oncology

## 2019-08-01 ENCOUNTER — Encounter: Payer: Self-pay | Admitting: General Practice

## 2019-08-01 ENCOUNTER — Ambulatory Visit: Payer: Self-pay | Admitting: Surgery

## 2019-08-01 ENCOUNTER — Inpatient Hospital Stay: Payer: Medicare Other

## 2019-08-01 ENCOUNTER — Ambulatory Visit (HOSPITAL_BASED_OUTPATIENT_CLINIC_OR_DEPARTMENT_OTHER): Payer: Medicare Other | Admitting: Genetic Counselor

## 2019-08-01 ENCOUNTER — Encounter: Payer: Self-pay | Admitting: Genetic Counselor

## 2019-08-01 ENCOUNTER — Ambulatory Visit
Admission: RE | Admit: 2019-08-01 | Discharge: 2019-08-01 | Disposition: A | Discharge status: Home / Self Care | Payer: Medicare Other | Source: Ambulatory Visit | Attending: Radiation Oncology | Admitting: Radiation Oncology

## 2019-08-01 ENCOUNTER — Encounter: Payer: Self-pay | Admitting: Physical Therapy

## 2019-08-01 ENCOUNTER — Ambulatory Visit: Payer: Medicare Other | Attending: Surgery | Admitting: Physical Therapy

## 2019-08-01 ENCOUNTER — Other Ambulatory Visit: Payer: Self-pay

## 2019-08-01 VITALS — BP 131/66 | HR 83 | Temp 98.9°F | Resp 18 | Ht 61.0 in | Wt 189.4 lb

## 2019-08-01 DIAGNOSIS — Z170 Estrogen receptor positive status [ER+]: Secondary | ICD-10-CM | POA: Insufficient documentation

## 2019-08-01 DIAGNOSIS — Z800 Family history of malignant neoplasm of digestive organs: Secondary | ICD-10-CM | POA: Diagnosis not present

## 2019-08-01 DIAGNOSIS — R293 Abnormal posture: Secondary | ICD-10-CM | POA: Diagnosis not present

## 2019-08-01 DIAGNOSIS — Z803 Family history of malignant neoplasm of breast: Secondary | ICD-10-CM | POA: Diagnosis not present

## 2019-08-01 DIAGNOSIS — C50911 Malignant neoplasm of unspecified site of right female breast: Secondary | ICD-10-CM | POA: Diagnosis not present

## 2019-08-01 DIAGNOSIS — C50211 Malignant neoplasm of upper-inner quadrant of right female breast: Secondary | ICD-10-CM

## 2019-08-01 LAB — CBC WITH DIFFERENTIAL (CANCER CENTER ONLY)
Abs Immature Granulocytes: 0.03 10*3/uL (ref 0.00–0.07)
Basophils Absolute: 0.1 10*3/uL (ref 0.0–0.1)
Basophils Relative: 1 %
Eosinophils Absolute: 0.1 10*3/uL (ref 0.0–0.5)
Eosinophils Relative: 1 %
HCT: 36.1 % (ref 36.0–46.0)
Hemoglobin: 12.2 g/dL (ref 12.0–15.0)
Immature Granulocytes: 0 %
Lymphocytes Relative: 22 %
Lymphs Abs: 1.9 10*3/uL (ref 0.7–4.0)
MCH: 32 pg (ref 26.0–34.0)
MCHC: 33.8 g/dL (ref 30.0–36.0)
MCV: 94.8 fL (ref 80.0–100.0)
Monocytes Absolute: 0.8 10*3/uL (ref 0.1–1.0)
Monocytes Relative: 9 %
Neutro Abs: 5.8 10*3/uL (ref 1.7–7.7)
Neutrophils Relative %: 67 %
Platelet Count: 243 10*3/uL (ref 150–400)
RBC: 3.81 MIL/uL — ABNORMAL LOW (ref 3.87–5.11)
RDW: 12.3 % (ref 11.5–15.5)
WBC Count: 8.7 10*3/uL (ref 4.0–10.5)
nRBC: 0 % (ref 0.0–0.2)

## 2019-08-01 LAB — CMP (CANCER CENTER ONLY)
ALT: 17 U/L (ref 0–44)
AST: 17 U/L (ref 15–41)
Albumin: 3.6 g/dL (ref 3.5–5.0)
Alkaline Phosphatase: 93 U/L (ref 38–126)
Anion gap: 12 (ref 5–15)
BUN: 22 mg/dL (ref 8–23)
CO2: 25 mmol/L (ref 22–32)
Calcium: 9.4 mg/dL (ref 8.9–10.3)
Chloride: 105 mmol/L (ref 98–111)
Creatinine: 1.36 mg/dL — ABNORMAL HIGH (ref 0.44–1.00)
GFR, Est AFR Am: 44 mL/min — ABNORMAL LOW (ref >60)
GFR, Estimated: 38 mL/min — ABNORMAL LOW (ref >60)
Glucose, Bld: 127 mg/dL — ABNORMAL HIGH (ref 70–99)
Potassium: 3.7 mmol/L (ref 3.5–5.1)
Sodium: 142 mmol/L (ref 135–145)
Total Bilirubin: 0.3 mg/dL (ref 0.3–1.2)
Total Protein: 7.4 g/dL (ref 6.5–8.1)

## 2019-08-01 LAB — GENETIC SCREENING ORDER

## 2019-08-01 NOTE — Progress Notes (Signed)
[place]  [phone] [phone]     ID: [name] [name] DOB: [birth year]  MR#: [id number]  WGY#:[id number]  Patient Care Team: Janie Morning, DO as PCP - General (Family Medicine) Erroll Luna, MD as Consulting Physician (General Surgery) Lamis Behrmann, Virgie Dad, MD as Consulting Physician (Oncology) Gery Pray, MD as Consulting Physician (Radiation Oncology) Mauro Kaufmann, RN as Oncology Nurse Navigator Rockwell Germany, RN as Oncology Nurse Navigator Mikey Bussing, DDS as Consulting Physician (Dentistry) Lorelle Gibbs, MD (Radiology) Chauncey Cruel, MD OTHER MD:  CHIEF COMPLAINT: Estrogen receptor positive breast cancer  CURRENT TREATMENT: Awaiting definitive surgery   HISTORY OF CURRENT ILLNESS: [name] [name] herself palpated a right breast lump for approximately 1 month. Of note, screening mammogram on 12/11/2018 was benign. She underwent right diagnostic mammography with tomography and right breast ultrasonography at [facility] on 07/12/2019 showing: breast density category B; 1.6 cm irregular mass in right breast at 2 o'clock.  Accordingly on 07/25/2019 she proceeded to biopsy of the right breast area in question. The pathology from this procedure ([id number]) showed: invasive and in situ mammary carcinoma, grade 2, e-cadherin positive. Prognostic indicators significant for: estrogen receptor, 100% positive with strong staining intensity and progesterone receptor, 70% positive with moderate staining intensity. Proliferation marker Ki67 at 2%. HER2 equivoal by immunohistochemistry (2+), but negative by fluorescent in situ hybridization with a signals ratio 1.53 and number per cell 2.75.  The patient's subsequent history is as detailed below.   INTERVAL HISTORY: [name] was evaluated in the multidisciplinary breast cancer clinic on 08/01/2019.[name] Her case was also presented at the multidisciplinary breast cancer conference on the same day.  At that time a preliminary plan was proposed: Consider lumpectomy without sentinel lymph node sampling, consider no radiation if sentinel sampling is performed, Oncotype, antiestrogens   REVIEW OF SYSTEMS: On the provided questionnaire, [name] [name] reports groin pain related to a recently pulled muscle, gum disease (for which she recently underwent laser surgery), foot edema (for which she is taking furosimide and potassium), some loose stools in the morning, history of rectal hemorrhoid, history of stage 3 kidney disease, occasional joint pain ("from simvastatin, most likely"), rare right leg numbness, thyroid problems (taking levothyroxine), and prediabetes. The patient denies unusual headaches, visual changes, nausea, vomiting, stiff neck, dizziness, or gait imbalance. There has been no cough, phlegm production, or pleurisy, no chest pain or pressure, and no change in bowel or bladder habits. The patient denies fever, rash, bleeding, unexplained fatigue or unexplained weight loss. A detailed review of systems was otherwise entirely negative.   PAST MEDICAL HISTORY: Past Medical History:  Diagnosis Date  . Anxiety    being weaned off lithium will be off on 06/23/11  . Diverticulosis 09/01/2011   mild, left sided  . Family history of breast cancer   . Family history of colon cancer   . Hyperlipidemia   . Hypothyroidism   . Leg cramps   . Numbness and tingling in left arm   . Osteopenia   . Stage 3 chronic kidney disease     PAST SURGICAL HISTORY: Past Surgical History:  Procedure Laterality Date  . COLONOSCOPY  06/09/06   Dr.Orr  . COLONOSCOPY  09/01/2011  . DILATATION & CURETTAGE/HYSTEROSCOPY WITH TRUECLEAR N/A 11/12/2013   Procedure: RESECTOSCOPIC POLYPECTOMY Irish Lack, D&C;  Surgeon: Terrance Mass, MD;  Location: [facility];  Service: Gynecology;  Laterality: N/A;  . ENDOMETRIAL BIOPSY  09/14/2013   Dr. Uvaldo Rising    FAMILY HISTORY: Family History  Problem Relation Age of Onset  .  Diabetes Mother   . Colon cancer Father 35  . Parkinson's disease Sister 61  . Alzheimer's disease Maternal Grandfather   . Breast cancer Cousin        dx. in her 32s (maternal cousin)  . Ovarian cancer Neg Hx   . Colon polyps Neg Hx   The patient is a vascular as the extraction.  Her parents both died at age 42, her father from colon cancer diagnosed a year before his death.  Her mother died from complications of diabetes.  The patient has one sister, no brothers.   GYNECOLOGIC HISTORY:  No LMP recorded. Patient is postmenopausal. Menarche: 74 years old Age at first live birth: 74 years old Romeo P 2 LMP unsure Contraceptive: previously used HRT: previously used for a "few years"  Hysterectomy? no BSO? 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 Husband [name] is a former Education officer, environmental ([place]) and later worked for [employer].. Daughter [name], age 34, works as a Government social research officer for [employer]) in [place], [place]. Son [name], age 89, works for a Production assistant, radio here in [place].  The patient has 2 grandchildren.  She attends [employer]    ADVANCED DIRECTIVES: In place.   HEALTH MAINTENANCE: Social History   Tobacco Use  . Smoking status: Never Smoker  . Smokeless tobacco: Never Used  Vaping Use  . Vaping Use: Never used  Substance Use Topics  . Alcohol use: No    Alcohol/week: 0.0 standard drinks  . Drug use: No     Colonoscopy: 08/2011  PAP: 2019  Bone density: 01/2019, osteopenia   No Known Allergies  Current Outpatient Medications  Medication Sig Dispense Refill  . b complex vitamins tablet Take 1 tablet by mouth daily.    . Biotin 5000 MCG TABS Take 1 tablet by mouth daily.    . Calcium Carbonate-Vitamin D (CALCIUM 600+D) 600-400 MG-UNIT per tablet Take 1 tablet by mouth 2 (two) times  daily.    . carbamazepine (TEGRETOL XR) 200 MG 12 hr tablet Take 400 mg by mouth at bedtime.     . Cholecalciferol (VITAMIN D-3) 1000 UNITS CAPS Take 1 capsule by mouth daily.    . Chromium-Cinnamon (CINNAMON PLUS CHROMIUM PO) Take 2 tablets by mouth 2 (two) times daily.    . Coenzyme Q10 (CO Q 10 PO) Take 300 mg by mouth daily.    . Cyanocobalamin (B-12) 5000 MCG SUBL Place 1 tablet under the tongue every morning.    . Estradiol 10 MCG TABS vaginal tablet INSERT 1 TABLET VAGINALLY TWICE A WEEK 25 tablet 0  . furosemide (LASIX) 40 MG tablet Take 40 mg by mouth.    . Magnesium 400 MG TABS Take 1 tablet by mouth daily.    . Multiple Vitamins-Minerals (MULTIVITAMIN WITH MINERALS) tablet Take 1 tablet by mouth daily.    . Omega-3 Fatty Acids (FISH OIL) 1200 MG CAPS Take 3 capsules by mouth 2 (two) times daily.     . Potassium 75 MG TABS Take by mouth.    . Probiotic Product (PROBIOTIC DAILY PO) Take 2 capsules by mouth daily.    . simvastatin (ZOCOR) 20 MG tablet Take 20 mg by mouth daily.    [name] SYNTHROID 88 MCG tablet Take 1 tablet by mouth Daily.  No current facility-administered medications for this visit.    OBJECTIVE: White woman who appears stated age  50:   08/01/19 1302  BP: 131/66  Pulse: 83  Resp: 18  Temp: 98.9 F (37.2 C)  SpO2: 99%     Body mass index is 35.79 kg/m.   Wt Readings from Last 3 Encounters:  08/01/19 189 lb 6.4 oz (85.9 kg)  01/08/19 188 lb (85.3 kg)  09/14/17 185 lb (83.9 kg)      ECOG FS:1 - Symptomatic but completely ambulatory  Ocular: Sclerae unicteric, pupils round and equal Ear-nose-throat: Wearing a mask Lymphatic: No cervical or supraclavicular adenopathy Lungs no rales or rhonchi Heart regular rate and rhythm Abd soft, obese, nontender, positive bowel sounds MSK no focal spinal tenderness, no joint edema Neuro: non-focal, well-oriented, appropriate affect Breasts: The right breast is status post recent biopsy.  There is a moderate  ecchymosis.  The left breast is benign.  Both axillae are benign.   LAB RESULTS:  CMP     Component Value Date/Time   NA 142 08/01/2019 1215   K 3.7 08/01/2019 1215   CL 105 08/01/2019 1215   CO2 25 08/01/2019 1215   GLUCOSE 127 (H) 08/01/2019 1215   BUN 22 08/01/2019 1215   CREATININE 1.36 (H) 08/01/2019 1215   CALCIUM 9.4 08/01/2019 1215   PROT 7.4 08/01/2019 1215   ALBUMIN 3.6 08/01/2019 1215   AST 17 08/01/2019 1215   ALT 17 08/01/2019 1215   ALKPHOS 93 08/01/2019 1215   BILITOT 0.3 08/01/2019 1215   GFRNONAA 38 (L) 08/01/2019 1215   GFRAA 44 (L) 08/01/2019 1215    No results found for: TOTALPROTELP, ALBUMINELP, A1GS, A2GS, BETS, BETA2SER, GAMS, MSPIKE, SPEI  Lab Results  Component Value Date   WBC 8.7 08/01/2019   NEUTROABS 5.8 08/01/2019   HGB 12.2 08/01/2019   HCT 36.1 08/01/2019   MCV 94.8 08/01/2019   PLT 243 08/01/2019    No results found for: LABCA2  No components found for: YSAYTK160  No results for input(s): INR in the last 168 hours.  No results found for: LABCA2  No results found for: FUX323  No results found for: FTD322  No results found for: GUR427  No results found for: CA2729  No components found for: HGQUANT  No results found for: CEA1 / No results found for: CEA1   No results found for: AFPTUMOR  No results found for: CHROMOGRNA  No results found for: KPAFRELGTCHN, LAMBDASER, KAPLAMBRATIO (kappa/lambda light chains)  No results found for: HGBA, HGBA2QUANT, HGBFQUANT, HGBSQUAN (Hemoglobinopathy evaluation)   No results found for: LDH  No results found for: IRON, TIBC, IRONPCTSAT (Iron and TIBC)  No results found for: FERRITIN  Urinalysis    Component Value Date/Time   COLORURINE YELLOW 11/09/2013 [place] 11/09/2013 1143   LABSPEC <1.005 (L) 11/09/2013 1143   PHURINE 7.0 11/09/2013 1143   GLUCOSEU NEGATIVE 11/09/2013 1143   HGBUR NEGATIVE 11/09/2013 1143   BILIRUBINUR NEGATIVE 11/09/2013 1143    KETONESUR NEGATIVE 11/09/2013 1143   PROTEINUR NEGATIVE 11/09/2013 1143   UROBILINOGEN 0.2 11/09/2013 1143   NITRITE NEGATIVE 11/09/2013 1143   LEUKOCYTESUR NEGATIVE 11/09/2013 1143     STUDIES: Mammography results discussed with the patient  ELIGIBLE FOR AVAILABLE RESEARCH PROTOCOL: AET  ASSESSMENT: 74 y.o. [place] woman status post right breast upper inner quadrant biopsy 07/25/2019 for a clinical T1c N0, stage IA invasive ductal carcinoma, grade 2, estrogen and progesterone receptor positive, HER-2 not amplified, with  an MIB-1 of 2%.  (1) definitive surgery pending  (2) genetics testing  (3) Oncotype to be obtained from the definitive surgical sample  (4) adjuvant radiation  (5) antiestrogens  PLAN: I met today with Cerenity to review her new diagnosis. Specifically we discussed the biology of her breast cancer, its diagnosis, staging, treatment  options and prognosis. We first reviewed the fact that cancer is not one disease but more than 100 different diseases and that it is important to keep them separate-- otherwise when friends and relatives discuss their own cancer experiences with Lismary confusion can result. Similarly we explained that if breast cancer spreads to the bone or liver, the patient would not have bone cancer or liver cancer, but breast cancer in the bone and breast cancer in the liver: one cancer in three places-- not 3 different cancers which otherwise would have to be treated in 3 different ways.  We discussed the difference between local and systemic therapy. In terms of loco-regional treatment, lumpectomy plus radiation is equivalent to mastectomy as far as survival is concerned. For this reason, and because the cosmetic results are generally superior, we recommend breast conserving surgery.  does not affect the ultimate outcome.  We then discussed the rationale for systemic therapy. There is some risk that this cancer may have already spread to other parts of  her body. Patients frequently ask at this point about bone scans, CAT scans and PET scans to find out if they have occult breast cancer somewhere else. The problem is that in early stage disease we are much more likely to find false positives then true cancers and this would expose the patient to unnecessary procedures as well as unnecessary radiation. Scans cannot answer the question the patient really would like to know, which is whether she has microscopic disease elsewhere in her body. For those reasons we do not recommend them.  Of course we would proceed to aggressive evaluation of any symptoms that might suggest metastatic disease, but that is not the case here.  Next we went over the options for systemic therapy which are anti-estrogens, anti-HER-2 immunotherapy, and chemotherapy. Vern does not meet criteria for anti-HER-2 immunotherapy. She is a good candidate for anti-estrogens.  The question of chemotherapy is more complicated. Chemotherapy is most effective in rapidly growing, aggressive tumors. It is much less effective in low-grade, slow growing cancers, like Tennyson 's. For that reason we are going to request an Oncotype from the definitive surgical sample, as suggested by NCCN guidelines. That will help [place] make a definitive decision regarding chemotherapy in this case.  The overall plan then is for surgery, adjuvant radiation and antiestrogens.  If the Oncotype returns high risk she would also receive chemotherapy.  Fritzie has a good understanding of the overall plan. She agrees with it. She knows the goal of treatment in her case is cure. She will call with any problems that may develop before her next visit here.  Total encounter time 65 minutes.Sarajane Jews C. Subrena Devereux, MD 08/01/2019 5:45 PM Medical Oncology and Hematology [facility] [place], [place] [address] Tel. [phone]    Fax. [phone]   This document serves as a record of services  personally performed by Lurline Del, MD. It was created on his behalf by Wilburn Mylar, a trained medical scribe. The creation of this record is based on the scribe's personal observations and the provider's statements to them.   Lindie Spruce MD, have reviewed the above  documentation for accuracy and completeness, and I agree with the above.    *Total Encounter Time as defined by the Centers for Medicare and Medicaid Services includes, in addition to the face-to-face time of a patient visit (documented in the note above) non-face-to-face time: obtaining and reviewing outside history, ordering and reviewing medications, tests or procedures, care coordination (communications with other health care professionals or caregivers) and documentation in the medical record.

## 2019-08-01 NOTE — Patient Instructions (Signed)

## 2019-08-01 NOTE — H&P (Signed)
[name] [name] Appointment: 08/01/2019 1:00 PM Location: [facility] Patient #: [id number] DOB: Aug 24, 1945 Undefined / Language: Cleophus Molt / Race: White Female  History of Present Illness [name] A. Mykeisha Dysert MD; 08/01/2019 2:34 PM) Patient words: Pt seen in [facility] for right breast cancer. Pt noted a small right medial breast mass 1.5 cm upper inner quadrant. Core bx shows IDC grade 2 ER/PR POS HER 2 NEU NEG KI 67 2 %  No other complaints except some soreness.  No family hx of breast cancer. Cares for husband at home but son and granddaughter lives with her.  The patient is a 74 year old female.   Past Surgical History [name], RN; 08/01/2019 7:53 AM) Breast Biopsy Right. Tonsillectomy  Diagnostic Studies History [name], RN; 08/01/2019 7:53 AM) Colonoscopy 5-10 years ago Mammogram within last year Pap Smear 1-5 years ago  Medication History [name], RN; 08/01/2019 7:53 AM) Medications Reconciled  Social History [name], RN; 08/01/2019 7:53 AM) Caffeine use Carbonated beverages, Coffee. No alcohol use No drug use Tobacco use Never smoker.  Family History [name], RN; 08/01/2019 7:53 AM) Colon Cancer Father. Colon Polyps Father. Diabetes Mellitus Mother.  Pregnancy / Birth History [name], RN; 08/01/2019 7:53 AM) Age at menarche 7 years. Gravida 2 Maternal age 28-30 Para 2 Regular periods  Other Problems [name], RN; 08/01/2019 7:53 AM) Breast Cancer Thyroid Disease     Review of Systems [name] RN; 08/01/2019 7:53 AM) General Not Present- Appetite Loss, Chills, Fatigue, Fever, Night Sweats, Weight Gain and Weight Loss. Skin Not Present- Change in Wart/Mole, Dryness, Hives, Jaundice, New Lesions, Non-Healing Wounds, Rash and Ulcer. Breast Not Present- Breast Mass, Breast Pain, Nipple Discharge and Skin Changes. Gastrointestinal Not Present- Abdominal Pain, Bloating, Bloody Stool, Change in Bowel Habits,  Chronic diarrhea, Constipation, Difficulty Swallowing, Excessive gas, Gets full quickly at meals, Hemorrhoids, Indigestion, Nausea, Rectal Pain and Vomiting. Female Genitourinary Not Present- Frequency, Nocturia, Painful Urination, Pelvic Pain and Urgency. Musculoskeletal Not Present- Back Pain, Joint Pain, Joint Stiffness, Muscle Pain, Muscle Weakness and Swelling of Extremities. Neurological Not Present- Decreased Memory, Fainting, Headaches, Numbness, Seizures, Tingling, Tremor, Trouble walking and Weakness. Psychiatric Not Present- Anxiety, Bipolar, Change in Sleep Pattern, Depression, Fearful and Frequent crying. Endocrine Not Present- Cold Intolerance, Excessive Hunger, Hair Changes, Heat Intolerance, Hot flashes and New Diabetes. Hematology Not Present- Blood Thinners, Easy Bruising, Excessive bleeding, Gland problems, HIV and Persistent Infections.   Physical Exam (Loris Seelye A. Starr Engel MD; 08/01/2019 2:34 PM)  General Mental Status-Alert. General Appearance-Consistent with stated age. Hydration-Well hydrated. Voice-Normal.  Head and Neck Head-normocephalic, atraumatic with no lesions or palpable masses. Trachea-midline. Thyroid Gland Characteristics - normal size and consistency.  Eye Eyeball - Bilateral-Extraocular movements intact. Sclera/Conjunctiva - Bilateral-No scleral icterus.  Chest and Lung Exam Chest and lung exam reveals -quiet, even and easy respiratory effort with no use of accessory muscles and on auscultation, normal breath sounds, no adventitious sounds and normal vocal resonance. Inspection Chest Wall - Normal. Back - normal.  Breast Breast - Left-Symmetric, Non Tender, No Biopsy scars, no Dimpling - Left, No Inflammation, No Lumpectomy scars, No Mastectomy scars, No Peau d' Orange. Breast - Right-Symmetric, Non Tender, No Biopsy scars, no Dimpling - Right, No Inflammation, No Lumpectomy scars, No Mastectomy scars, No Peau d'  Orange. Breast Lump-No Palpable Breast Mass. Note: bruising right medial breast 5 mm mass under skin left breast normal  Cardiovascular Cardiovascular examination reveals -normal heart sounds, regular rate and rhythm with no murmurs and normal  pedal pulses bilaterally.  Abdomen Inspection Inspection of the abdomen reveals - No Hernias. Skin - Scar - no surgical scars. Palpation/Percussion Palpation and Percussion of the abdomen reveal - Soft, Non Tender, No Rebound tenderness, No Rigidity (guarding) and No hepatosplenomegaly. Auscultation Auscultation of the abdomen reveals - Bowel sounds normal.  Neurologic Neurologic evaluation reveals -alert and oriented x 3 with no impairment of recent or remote memory. Mental Status-Normal.  Musculoskeletal Normal Exam - Left-Upper Extremity Strength Normal and Lower Extremity Strength Normal. Normal Exam - Right-Upper Extremity Strength Normal and Lower Extremity Strength Normal.  Lymphatic Head & Neck  General Head & Neck Lymphatics: Bilateral - Description - Normal. Axillary  General Axillary Region: Bilateral - Description - Normal. Tenderness - Non Tender. Femoral & Inguinal  Generalized Femoral & Inguinal Lymphatics: Bilateral - Description - Normal. Tenderness - Non Tender.    Assessment & Plan (Maevis Mumby A. Etna Forquer MD; 08/01/2019 2:36 PM)  STAGE 1 BREAST CANCER, ER+, RIGHT (C50.911) Impression: Pt opted for right breast lumpectomy seed localized discussed omitting SLN mapping given age, size and grade but she desires SLN mapping.  Risk of lumpectomy include bleeding, infection, seroma, more surgery, use of seed/wire, wound care, cosmetic deformity and the need for other treatments, death , blood clots, death. Pt agrees to proceed. Risk of sentinel lymph node mapping include bleeding, infection, lymphedema, shoulder pain. stiffness, dye allergy. cosmetic deformity , blood clots, death, need for more surgery. Pt agrees  to proceed.   total time 45 minutes  Current Plans You are being scheduled for surgery- Our schedulers will call you.  You should hear from our office's scheduling department within 5 working days about the location, date, and time of surgery. We try to make accommodations for patient's preferences in scheduling surgery, but sometimes the OR schedule or the surgeon's schedule prevents [place] from making those accommodations.  If you have not heard from our office [phone]) in 5 working days, call the office and ask for your surgeon's nurse.  If you have other questions about your diagnosis, plan, or surgery, call the office and ask for your surgeon's nurse.  Pt Education - [facility] Breast Cancer Information Given - [facility] "Breast Journey" Package We discussed the staging and pathophysiology of breast cancer. We discussed all of the different options for treatment for breast cancer including surgery, chemotherapy, radiation therapy, Herceptin, and antiestrogen therapy. We discussed a sentinel lymph node biopsy as she does not appear to having lymph node involvement right now. We discussed the performance of that with injection of radioactive tracer and blue dye. We discussed that she would have an incision underneath her axillary hairline. We discussed that there is a bout a 10-20% chance of having a positive node with a sentinel lymph node biopsy and we will await the permanent pathology to make any other first further decisions in terms of her treatment. One of these options might be to return to the operating room to perform an axillary lymph node dissection. We discussed about a 1-2% risk lifetime of chronic shoulder pain as well as lymphedema associated with a sentinel lymph node biopsy. We discussed the options for treatment of the breast cancer which included lumpectomy versus a mastectomy. We discussed the performance of the lumpectomy with a wire placement. We discussed a 10-20% chance of a  positive margin requiring reexcision in the operating room. We also discussed that she may need radiation therapy or antiestrogen therapy or both if she undergoes lumpectomy. We discussed the mastectomy and the  postoperative care for that as well. We discussed that there is no difference in her survival whether she undergoes lumpectomy with radiation therapy or antiestrogen therapy versus a mastectomy. There is a slight difference in the local recurrence rate being 3-5% with lumpectomy and about 1% with a mastectomy. We discussed the risks of operation including bleeding, infection, possible reoperation. She understands her further therapy will be based on what her stages at the time of her operation.  Pt Education - [facility]b breast cancer surgery: discussed with patient and provided information. Pt Education - [facility] Breast Biopsy HCI: discussed with patient and provided information.

## 2019-08-01 NOTE — Therapy (Signed)
[place], [place], [address] Phone: [phone]   Fax:  [phone]  Physical Therapy Evaluation  Patient Details  Name: [name] [name] MRN: [id number] Date of Birth: 01-23-1946 Referring Provider (PT): Dr. Erroll Luna   Encounter Date: 08/01/2019   PT End of Session - 08/01/19 1526    Visit Number 1    Number of Visits 2    Date for PT Re-Evaluation 09/26/19    PT Start Time 1436    PT Stop Time 1510    PT Time Calculation (min) 34 min    Behavior During Therapy [facility] for tasks assessed/performed           Past Medical History:  Diagnosis Date  . Anxiety    being weaned off lithium will be off on 06/23/11  . Diverticulosis 09/01/2011   mild, left sided  . Hyperlipidemia   . Hypothyroidism   . Leg cramps   . Numbness and tingling in left arm   . Osteopenia   . Stage 3 chronic kidney disease     Past Surgical History:  Procedure Laterality Date  . COLONOSCOPY  06/09/06   Dr.Orr  . COLONOSCOPY  09/01/2011  . DILATATION & CURETTAGE/HYSTEROSCOPY WITH TRUECLEAR N/A 11/12/2013   Procedure: RESECTOSCOPIC POLYPECTOMY Irish Lack, D&C;  Surgeon: Terrance Mass, MD;  Location: [facility];  Service: Gynecology;  Laterality: N/A;  . ENDOMETRIAL BIOPSY  09/14/2013   Dr. Uvaldo Rising    There were no vitals filed for this visit.    Subjective Assessment - 08/01/19 1520    Subjective Patient reports she is here today to be seen by her medical team for her newly diagnosed right breast cancer.    Pertinent History Patient was diagnosed on 07/12/2019 with right grade II invasive ductal carcinoma breast cancer. It measures 1.6 cm and is located in the upper inner quadrant. It is ER/PR positive and HER2 negative with a Ki67 of 2%.    Patient Stated Goals reduce lymphedema risk and learn post op shoulder ROM HEP    Currently in Pain? No/denies              [facility] PT Assessment - 08/01/19 0001       Assessment   Medical Diagnosis Right breast cancer    Referring Provider (PT) Dr. [name] [place]    Onset Date/Surgical Date 07/12/19    Hand Dominance Right    Prior Therapy none      Precautions   Precautions Other (comment)    Precaution Comments active cancer      Restrictions   Weight Bearing Restrictions No      Balance Screen   Has the patient fallen in the past 6 months No    Has the patient had a decrease in activity level because of a fear of falling?  No    Is the patient reluctant to leave their home because of a fear of falling?  No      Home Social worker Private residence    Living Arrangements Spouse/significant other   Husband has Alzheimer's   Available Help at Discharge Family      Prior Function   Level of [place] Retired    Leisure She does not exercise      Cognition   Overall Cognitive Status Within Functional Limits for tasks assessed      Posture/Postural Control   Posture/Postural Control Postural limitations  Postural Limitations Rounded Shoulders;Forward head      ROM / Strength   AROM / PROM / Strength AROM;Strength      AROM   Overall AROM Comments Cervical AROM is WNL    AROM Assessment Site Shoulder    Right/Left Shoulder Right;Left    Right Shoulder Extension 61 Degrees    Right Shoulder Flexion 140 Degrees    Right Shoulder ABduction 154 Degrees    Right Shoulder Internal Rotation 76 Degrees    Right Shoulder External Rotation 80 Degrees    Left Shoulder Extension 61 Degrees    Left Shoulder Flexion 122 Degrees    Left Shoulder ABduction 140 Degrees    Left Shoulder Internal Rotation 61 Degrees    Left Shoulder External Rotation 86 Degrees      Strength   Overall Strength Within functional limits for tasks performed             LYMPHEDEMA/ONCOLOGY QUESTIONNAIRE - 08/01/19 0001      Type   Cancer Type Right breast cancer      Lymphedema Assessments   Lymphedema  Assessments Upper extremities      Right Upper Extremity Lymphedema   10 cm Proximal to Olecranon Process 32.3 cm    Olecranon Process 27.4 cm    10 cm Proximal to Ulnar Styloid Process 23.1 cm    Just Proximal to Ulnar Styloid Process 16 cm    Across Hand at [employer] 19 cm    At [place] of 2nd Digit 7.3 cm      Left Upper Extremity Lymphedema   10 cm Proximal to Olecranon Process 30.4 cm    Olecranon Process 27.2 cm    10 cm Proximal to Ulnar Styloid Process 22.8 cm    Just Proximal to Ulnar Styloid Process 15.5 cm    Across Hand at [employer] 18.7 cm    At [place] of 2nd Digit 6.8 cm           L-DEX FLOWSHEETS - 08/01/19 1500      L-DEX LYMPHEDEMA SCREENING   Measurement Type Unilateral    POSITION  Standing    DOMINANT SIDE Right    At Risk Side Right    BASELINE SCORE (UNILATERAL) -4.2           The patient was assessed using the L-Dex machine today to produce a lymphedema index baseline score. The patient will be reassessed on a regular basis (typically every 3 months) to obtain new L-Dex scores. If the score is > 6.5 points away from his/her baseline score indicating onset of subclinical lymphedema, it will be recommended to wear a compression garment for 4 weeks, 12 hours per day and then be reassessed. If the score continues to be > 6.5 points from baseline at reassessment, we will initiate lymphedema treatment. Assessing in this manner has a 95% rate of preventing clinically significant lymphedema.      Katina Dung - 08/01/19 0001    Open a tight or new jar No difficulty    Do heavy household chores (wash walls, wash floors) No difficulty    Carry a shopping bag or briefcase No difficulty    Wash your back No difficulty    Use a knife to cut food No difficulty    Recreational activities in which you take some force or impact through your arm, shoulder, or hand (golf, hammering, tennis) No difficulty    During the past week, to what extent has your arm,  shoulder or hand problem interfered with your normal social activities with family, friends, neighbors, or groups? Not at all    During the past week, to what extent has your arm, shoulder or hand problem limited your work or other regular daily activities Not at all    Arm, shoulder, or hand pain. None    Tingling (pins and needles) in your arm, shoulder, or hand None    Difficulty Sleeping No difficulty    DASH Score 0 %            Objective measurements completed on examination: See above findings.        Patient was instructed today in a home exercise program today for post op shoulder range of motion. These included active assist shoulder flexion in sitting, scapular retraction, wall walking with shoulder abduction, and hands behind head external rotation.  She was encouraged to do these twice a day, holding 3 seconds and repeating 5 times when permitted by her physician.           PT Education - 08/01/19 1525    Education Details Lymphedema risk reduction and post op shoulder ROM HEP    Person(s) Educated Patient    Methods Explanation;Demonstration;Handout    Comprehension Returned demonstration;Verbalized understanding               PT Long Term Goals - 08/01/19 1530      PT LONG TERM GOAL #1   Title Patient will demonstrate she has regained full shoulder ROM and function post operatively compared to baselines.    Time 8    Period Weeks    Status New    Target Date 09/26/19           Breast Clinic Goals - 08/01/19 1530      Patient will be able to verbalize understanding of pertinent lymphedema risk reduction practices relevant to her diagnosis specifically related to skin care.   Time 1    Period Days    Status Achieved      Patient will be able to return demonstrate and/or verbalize understanding of the post-op home exercise program related to regaining shoulder range of motion.   Time 1    Period Days    Status Achieved      Patient will be  able to verbalize understanding of the importance of attending the postoperative After Breast Cancer Class for further lymphedema risk reduction education and therapeutic exercise.   Time 1    Period Days    Status Achieved                 Plan - 08/01/19 1527    Clinical Impression Statement Patient was diagnosed on 07/12/2019 with right grade II invasive ductal carcinoma breast cancer. It measures 1.6 cm and is located in the upper inner quadrant. It is ER/PR positive and HER2 negative with a Ki67 of 2%. Her multidisciplinary medical team met prior to her assessments to determine a recommended treatment plan. She is planning to have a right lumpectomy and sentinel node biopsy followed by possible radiation and anti-estrogen therapy. She will benefit from a post op PT reassessment to determine needs and from L-Dex screenings every 3 months to detect subclinical lymphedema.    Stability/Clinical Decision Making Stable/Uncomplicated    Clinical Decision Making Low    Rehab Potential Excellent    PT Frequency --   Eval and 1 f/u visit   PT Treatment/Interventions ADLs/Self Care Home Management;Patient/family education;Therapeutic exercise  PT Next Visit Plan Will reassess 3-4 weeks post op to determine needs    PT Home Exercise Plan Post op shoulder ROM HEP    Consulted and Agree with Plan of Care Patient           Patient will benefit from skilled therapeutic intervention in order to improve the following deficits and impairments:  Postural dysfunction, Decreased range of motion, Impaired UE functional use, Pain, Decreased knowledge of precautions  Visit Diagnosis: Malignant neoplasm of upper-inner quadrant of right breast in female, estrogen receptor positive ([place]) - Plan: PT plan of care cert/re-cert  Abnormal posture - Plan: PT plan of care cert/re-cert   Patient will follow up at outpatient cancer rehab 3-4 weeks following surgery.  If the patient requires physical therapy at  that time, a specific plan will be dictated and sent to the referring physician for approval. The patient was educated today on appropriate basic range of motion exercises to begin post operatively and the importance of attending the After Breast Cancer class following surgery.  Patient was educated today on lymphedema risk reduction practices as it pertains to recommendations that will benefit the patient immediately following surgery.  She verbalized good understanding.     Problem List Patient Active Problem List   Diagnosis Date Noted  . Malignant neoplasm of upper-inner quadrant of right breast in female, estrogen receptor positive ([place]) 07/30/2019  . Osteopenia 09/10/2016  . Vaginal atrophy 09/10/2015  . Menopause 09/10/2015   Annia Friendly, PT 08/01/19 3:32 PM  [place], [place], [address] Phone: [phone]   Fax:  [phone]  Name: Bambi Fehnel MRN: [id number] Date of Birth: [birth year]

## 2019-08-01 NOTE — H&P (View-Only) (Signed)
[name] [name] Appointment: 08/01/2019 1:00 PM Location: [facility] Patient #: [id number] DOB: [birth year] Undefined / Language: Cleophus [name] / Race: White Female  History of Present Illness [name] Moores A. Offie Waide MD; 08/01/2019 2:34 PM) Patient words: Pt seen in [facility] for right breast cancer. Pt noted a small right medial breast mass 1.5 cm upper inner quadrant. Core bx shows IDC grade 2 ER/PR POS HER 2 NEU NEG KI 67 2 %  No other complaints except some soreness.  No family hx of breast cancer. Cares for husband at home but son and granddaughter lives with her.  The patient is a 74 year old female.   Past Surgical History [name], RN; 08/01/2019 7:53 AM) Breast Biopsy Right. Tonsillectomy  Diagnostic Studies History [name], RN; 08/01/2019 7:53 AM) Colonoscopy 5-10 years ago Mammogram within last year Pap Smear 1-5 years ago  Medication History [name], RN; 08/01/2019 7:53 AM) Medications Reconciled  Social History [name], RN; 08/01/2019 7:53 AM) Caffeine use Carbonated beverages, Coffee. No alcohol use No drug use Tobacco use Never smoker.  Family History [name], RN; 08/01/2019 7:53 AM) Colon Cancer Father. Colon Polyps Father. Diabetes Mellitus Mother.  Pregnancy / Birth History [name], RN; 08/01/2019 7:53 AM) Age at menarche 72 years. Gravida 2 Maternal age 28-30 Para 2 Regular periods  Other Problems [name], RN; 08/01/2019 7:53 AM) Breast Cancer Thyroid Disease     Review of Systems [name] RN; 08/01/2019 7:53 AM) General Not Present- Appetite Loss, Chills, Fatigue, Fever, Night Sweats, Weight Gain and Weight Loss. Skin Not Present- Change in Wart/Mole, Dryness, Hives, Jaundice, New Lesions, Non-Healing Wounds, Rash and Ulcer. Breast Not Present- Breast Mass, Breast Pain, Nipple Discharge and Skin Changes. Gastrointestinal Not Present- Abdominal Pain, Bloating, Bloody Stool, Change in Bowel Habits,  Chronic diarrhea, Constipation, Difficulty Swallowing, Excessive gas, Gets full quickly at meals, Hemorrhoids, Indigestion, Nausea, Rectal Pain and Vomiting. Female Genitourinary Not Present- Frequency, Nocturia, Painful Urination, Pelvic Pain and Urgency. Musculoskeletal Not Present- Back Pain, Joint Pain, Joint Stiffness, Muscle Pain, Muscle Weakness and Swelling of Extremities. Neurological Not Present- Decreased Memory, Fainting, Headaches, Numbness, Seizures, Tingling, Tremor, Trouble walking and Weakness. Psychiatric Not Present- Anxiety, Bipolar, Change in Sleep Pattern, Depression, Fearful and Frequent crying. Endocrine Not Present- Cold Intolerance, Excessive Hunger, Hair Changes, Heat Intolerance, Hot flashes and New Diabetes. Hematology Not Present- Blood Thinners, Easy Bruising, Excessive bleeding, Gland problems, HIV and Persistent Infections.   Physical Exam ([name] A. Dal Blew MD; 08/01/2019 2:34 PM)  General Mental Status-Alert. General Appearance-Consistent with stated age. Hydration-Well hydrated. Voice-Normal.  Head and Neck Head-normocephalic, atraumatic with no lesions or palpable masses. Trachea-midline. Thyroid Gland Characteristics - normal size and consistency.  Eye Eyeball - Bilateral-Extraocular movements intact. Sclera/Conjunctiva - Bilateral-No scleral icterus.  Chest and Lung Exam Chest and lung exam reveals -quiet, even and easy respiratory effort with no use of accessory muscles and on auscultation, normal breath sounds, no adventitious sounds and normal vocal resonance. Inspection Chest Wall - Normal. Back - normal.  Breast Breast - Left-Symmetric, Non Tender, No Biopsy scars, no Dimpling - Left, No Inflammation, No Lumpectomy scars, No Mastectomy scars, No Peau d' Orange. Breast - Right-Symmetric, Non Tender, No Biopsy scars, no Dimpling - Right, No Inflammation, No Lumpectomy scars, No Mastectomy scars, No Peau d'  Orange. Breast Lump-No Palpable Breast Mass. Note: bruising right medial breast 5 mm mass under skin left breast normal  Cardiovascular Cardiovascular examination reveals -normal heart sounds, regular rate and rhythm with no murmurs and normal  pedal pulses bilaterally.  Abdomen Inspection Inspection of the abdomen reveals - No Hernias. Skin - Scar - no surgical scars. Palpation/Percussion Palpation and Percussion of the abdomen reveal - Soft, Non Tender, No Rebound tenderness, No Rigidity (guarding) and No hepatosplenomegaly. Auscultation Auscultation of the abdomen reveals - Bowel sounds normal.  Neurologic Neurologic evaluation reveals -alert and oriented x 3 with no impairment of recent or remote memory. Mental Status-Normal.  Musculoskeletal Normal Exam - Left-Upper Extremity Strength Normal and Lower Extremity Strength Normal. Normal Exam - Right-Upper Extremity Strength Normal and Lower Extremity Strength Normal.  Lymphatic Head & Neck  General Head & Neck Lymphatics: Bilateral - Description - Normal. Axillary  General Axillary Region: Bilateral - Description - Normal. Tenderness - Non Tender. Femoral & Inguinal  Generalized Femoral & Inguinal Lymphatics: Bilateral - Description - Normal. Tenderness - Non Tender.    Assessment & Plan (Tilmon Wisehart A. [name] MD; 08/01/2019 2:36 PM)  STAGE 1 BREAST CANCER, ER+, RIGHT (C50.911) Impression: Pt opted for right breast lumpectomy seed localized discussed omitting SLN mapping given age, size and grade but she desires SLN mapping.  Risk of lumpectomy include bleeding, infection, seroma, more surgery, use of seed/wire, wound care, cosmetic deformity and the need for other treatments, death , blood clots, death. Pt agrees to proceed. Risk of sentinel lymph node mapping include bleeding, infection, lymphedema, shoulder pain. stiffness, dye allergy. cosmetic deformity , blood clots, death, need for more surgery. Pt agrees  to proceed.   total time 45 minutes  Current Plans You are being scheduled for surgery- Our schedulers will call you.  You should hear from our office's scheduling department within 5 working days about the location, date, and time of surgery. We try to make accommodations for patient's preferences in scheduling surgery, but sometimes the OR schedule or the surgeon's schedule prevents [place] from making those accommodations.  If you have not heard from our office [phone]) in 5 working days, call the office and ask for your surgeon's nurse.  If you have other questions about your diagnosis, plan, or surgery, call the office and ask for your surgeon's nurse.  Pt Education - [facility] Breast Cancer Information Given - [facility] "Breast Journey" Package We discussed the staging and pathophysiology of breast cancer. We discussed all of the different options for treatment for breast cancer including surgery, chemotherapy, radiation therapy, Herceptin, and antiestrogen therapy. We discussed a sentinel lymph node biopsy as she does not appear to having lymph node involvement right now. We discussed the performance of that with injection of radioactive tracer and blue dye. We discussed that she would have an incision underneath her axillary hairline. We discussed that there is a bout a 10-20% chance of having a positive node with a sentinel lymph node biopsy and we will await the permanent pathology to make any other first further decisions in terms of her treatment. One of these options might be to return to the operating room to perform an axillary lymph node dissection. We discussed about a 1-2% risk lifetime of chronic shoulder pain as well as lymphedema associated with a sentinel lymph node biopsy. We discussed the options for treatment of the breast cancer which included lumpectomy versus a mastectomy. We discussed the performance of the lumpectomy with a wire placement. We discussed a 10-20% chance of a  positive margin requiring reexcision in the operating room. We also discussed that she may need radiation therapy or antiestrogen therapy or both if she undergoes lumpectomy. We discussed the mastectomy and the  postoperative care for that as well. We discussed that there is no difference in her survival whether she undergoes lumpectomy with radiation therapy or antiestrogen therapy versus a mastectomy. There is a slight difference in the local recurrence rate being 3-5% with lumpectomy and about 1% with a mastectomy. We discussed the risks of operation including bleeding, infection, possible reoperation. She understands her further therapy will be based on what her stages at the time of her operation.  Pt Education - [facility] breast cancer surgery: discussed with patient and provided information. Pt Education - [facility] Breast Biopsy HCI: discussed with patient and provided information.

## 2019-08-01 NOTE — Progress Notes (Signed)
Radiation Oncology         [phone]) [phone] ________________________________  Multidisciplinary Breast Oncology Clinic [facility]) Initial Outpatient Consultation  Name: [name] [name] MRN: [id number]  Date: 08/01/2019  DOB: November 14, 1945  PZ:WCHENID, Hinton Dyer, DO  Erroll Luna, MD   REFERRING PHYSICIAN: Erroll Luna, MD  DIAGNOSIS: The encounter diagnosis was Malignant neoplasm of upper-inner quadrant of right breast in female, estrogen receptor positive ([place]).  Stage IA Right Breast UIQ, Invasive Ductal Carcinoma with DCIS, ER+ / PR+ / Her2-, Grade 2    ICD-10-CM   1. Malignant neoplasm of upper-inner quadrant of right breast in female, estrogen receptor positive ([place])  C50.211    Z17.0     HISTORY OF PRESENT ILLNESS::[name] [name] is a 74 y.o. female who is presenting to the office today for evaluation of her newly diagnosed breast cancer. She is not accompanied by anyone. She is doing well overall.   She underwent unilateral diagnostic mammography with tomography and right breast ultrasonography at [facility] on 07/12/2019 for one-month history of palpable right breast mass. Results showed a 1.6 cm irregular mass in the right breast that was highly suggestive of malignancy.  Biopsy on 07/25/2019 showed grade 2 invasive mammary carcinoma with mammary carcinoma in-situ of the right breast at the 2 o'clock position. Prognostic indicators significant for estrogen receptor, 100% positive with strong staining intensity and progesterone receptor, 70% positive with moderate staining intensity. Proliferation marker Ki67 at 2%. HER2 negative. An e-cadherin stain was positive, supporting a ductal phenotype.  She also underwent a left breast diagnostic mammogram on 07/27/2019, which did not show any evidence of malignancy.  Menarche: 74 years old Age at first live birth: 74 years old GP: 2 LMP: N/A Contraceptive: Yes HRT: Yes, for a few years but does not recall when   The patient was referred  today for presentation in the multidisciplinary conference.  Radiology studies and pathology slides were presented there for review and discussion of treatment options.  A consensus was discussed regarding potential next steps.  PREVIOUS RADIATION THERAPY: No  PAST MEDICAL HISTORY:  Past Medical History:  Diagnosis Date  . Anxiety    being weaned off lithium will be off on 06/23/11  . Diverticulosis 09/01/2011   mild, left sided  . Family history of breast cancer   . Family history of colon cancer   . Hyperlipidemia   . Hypothyroidism   . Leg cramps   . Numbness and tingling in left arm   . Osteopenia   . Stage 3 chronic kidney disease     PAST SURGICAL HISTORY: Past Surgical History:  Procedure Laterality Date  . COLONOSCOPY  06/09/06   Dr.Orr  . COLONOSCOPY  09/01/2011  . DILATATION & CURETTAGE/HYSTEROSCOPY WITH TRUECLEAR N/A 11/12/2013   Procedure: RESECTOSCOPIC POLYPECTOMY Irish Lack, D&C;  Surgeon: Terrance Mass, MD;  Location: [facility];  Service: Gynecology;  Laterality: N/A;  . ENDOMETRIAL BIOPSY  09/14/2013   Dr. Uvaldo Rising    FAMILY HISTORY:  Family History  Problem Relation Age of Onset  . Diabetes Mother   . Colon cancer Father 21  . Parkinson's disease Sister 68  . Alzheimer's disease Maternal Grandfather   . Breast cancer Cousin        dx. in her 79s (maternal cousin)  . Ovarian cancer Neg Hx   . Colon polyps Neg Hx     SOCIAL HISTORY:  Social History   Socioeconomic History  . Marital status: Married    Spouse name: Not on file  .  Number of children: 2  . Years of education: Not on file  . Highest education level: Not on file  Occupational History  . Occupation: Retired  Tobacco Use  . Smoking status: Never Smoker  . Smokeless tobacco: Never Used  Vaping Use  . Vaping Use: Never used  Substance and Sexual Activity  . Alcohol use: No    Alcohol/week: 0.0 standard drinks  . Drug use: No  . Sexual activity: Never  Other Topics Concern  .  Not on file  Social History Narrative   Married 1 son one daughter retired.   Social Determinants of Health   Financial Resource Strain:   . Difficulty of Paying Living Expenses:   Food Insecurity:   . Worried About Charity fundraiser in the Last Year:   . Arboriculturist in the Last Year:   Transportation Needs:   . Film/video editor (Medical):   [name] Lack of Transportation (Non-Medical):   Physical Activity:   . Days of Exercise per Week:   . Minutes of Exercise per Session:   Stress:   . Feeling of Stress :   Social Connections:   . Frequency of Communication with Friends and Family:   . Frequency of Social Gatherings with Friends and Family:   . Attends Religious Services:   . Active Member of Clubs or Organizations:   . Attends Archivist Meetings:   [name] Marital Status:     ALLERGIES: No Known Allergies  MEDICATIONS:  Current Outpatient Medications  Medication Sig Dispense Refill  . b complex vitamins tablet Take 1 tablet by mouth daily.    . Biotin 5000 MCG TABS Take 1 tablet by mouth daily.    . Calcium Carbonate-Vitamin D (CALCIUM 600+D) 600-400 MG-UNIT per tablet Take 1 tablet by mouth 2 (two) times daily.    . carbamazepine (TEGRETOL XR) 200 MG 12 hr tablet Take 400 mg by mouth at bedtime.     . Cholecalciferol (VITAMIN D-3) 1000 UNITS CAPS Take 1 capsule by mouth daily.    . Chromium-Cinnamon (CINNAMON PLUS CHROMIUM PO) Take 2 tablets by mouth 2 (two) times daily.    . Coenzyme Q10 (CO Q 10 PO) Take 300 mg by mouth daily.    . Cyanocobalamin (B-12) 5000 MCG SUBL Place 1 tablet under the tongue every morning.    . Estradiol 10 MCG TABS vaginal tablet INSERT 1 TABLET VAGINALLY TWICE A WEEK 25 tablet 0  . furosemide (LASIX) 40 MG tablet Take 40 mg by mouth.    . Magnesium 400 MG TABS Take 1 tablet by mouth daily.    . Multiple Vitamins-Minerals (MULTIVITAMIN WITH MINERALS) tablet Take 1 tablet by mouth daily.    . Omega-3 Fatty Acids (FISH OIL) 1200 MG  CAPS Take 3 capsules by mouth 2 (two) times daily.     . Potassium 75 MG TABS Take by mouth.    . Probiotic Product (PROBIOTIC DAILY PO) Take 2 capsules by mouth daily.    . simvastatin (ZOCOR) 20 MG tablet Take 20 mg by mouth daily.    [name] SYNTHROID 88 MCG tablet Take 1 tablet by mouth Daily.     No current facility-administered medications for this encounter.    REVIEW OF SYSTEMS: A 10+ POINT REVIEW OF SYSTEMS WAS OBTAINED including neurology, dermatology, psychiatry, cardiac, respiratory, lymph, extremities, GI, GU, musculoskeletal, constitutional, reproductive, HEENT. On the provided form, she reports loose stools in the mornings after breakfast, rectal bleeding from a hemorrhoid, occasional  joint pain, rare numbness down right leg, lump in right breast, and anxiety. She also reports history of gum disease, stage 3 kidney disease, thyroid problem on Levothyroxine, pedal edema for which she is taking Furosemide and potassium pills, and pre-diabetes. She denies chest pain, nausea, vomiting, dysuria, skin changes, and any other symptoms.    PHYSICAL EXAM:   Vitals with BMI 01/08/2019  Height '5\' 0"'$   Weight 188 lbs  BMI 81.01  Systolic 751  Diastolic 82  Pulse     Lungs are clear to auscultation bilaterally. Heart has regular rate and rhythm. No palpable cervical, supraclavicular, or axillary adenopathy. Abdomen soft, non-tender, normal bowel sounds. Left breast is pendulous with no palpable mass, nipple discharge, or bleeding.  Right breast is pendulous with bruising in the upper inner quadrant. There is a palpable pea size superficial nodule at approximately the 2 o'clock position near the surface. No nipple discharge or bleeding.  KPS = 90  100 - Normal; no complaints; no evidence of disease. 90   - Able to carry on normal activity; minor signs or symptoms of disease. 80   - Normal activity with effort; some signs or symptoms of disease. 27   - Cares for self; unable to carry on normal  activity or to do active work. 60   - Requires occasional assistance, but is able to care for most of his personal needs. 50   - Requires considerable assistance and frequent medical care. 47   - Disabled; requires special care and assistance. 12   - Severely disabled; hospital admission is indicated although death not imminent. 82   - Very sick; hospital admission necessary; active supportive treatment necessary. 10   - Moribund; fatal processes progressing rapidly. 0     - Dead  Karnofsky DA, Abelmann [place], Craver LS and Burchenal [facility] [phone]) The use of the nitrogen mustards in the palliative treatment of carcinoma: with particular reference to bronchogenic carcinoma Cancer 1 634-56  LABORATORY DATA:  Lab Results  Component Value Date   WBC 8.7 08/01/2019   HGB 12.2 08/01/2019   HCT 36.1 08/01/2019   MCV 94.8 08/01/2019   PLT 243 08/01/2019   Lab Results  Component Value Date   NA 142 08/01/2019   K 3.7 08/01/2019   CL 105 08/01/2019   CO2 25 08/01/2019   Lab Results  Component Value Date   ALT 17 08/01/2019   AST 17 08/01/2019   ALKPHOS 93 08/01/2019   BILITOT 0.3 08/01/2019    PULMONARY FUNCTION TEST:   Recent Review Flowsheet Data   There is no flowsheet data to display.     RADIOGRAPHY: No results found.    IMPRESSION: Stage 1A Right Breast UIQ, Invasive Ductal Carcinoma with DCIS, ER+ / PR+ / Her2-, Grade 2  The patient will be a good candidate for lumpectomy. She does wish to proceed with sentinel lymph node biopsy as part of her overall procedure. She will discuss this further with Dr. Brantley Stage. Depending on the final pathologic results, we will discuss radiation therapy at a later date. We discussed the general course of radiation, potential side effects, and toxicities.   PLAN:  1. Right breast lumpectomy with sentinel lymph node biopsy 2. Oncoptype DX 3. Possible adjuvant radiation therapy 4. Aromatase inhibitor    ------------------------------------------------  Blair Promise, PhD, MD  This document serves as a record of services personally performed by Gery Pray, MD. It was created on his behalf by Clerance Lav, a trained medical scribe.  The creation of this record is based on the scribe's personal observations and the provider's statements to them. This document has been checked and approved by the attending provider.

## 2019-08-01 NOTE — Progress Notes (Signed)
[facility] Psychosocial Distress Screening Spiritual Care  Met with [name] [name] in [facility] to introduce [facility] team/resources, reviewing distress screen per protocol.  The patient scored a 4 on the Psychosocial Distress Thermometer which indicates moderate distress. Also assessed for distress and other psychosocial needs.   ONCBCN DISTRESS SCREENING 08/01/2019  Distress experienced in past week (1-10) 4  Family Problem type Partner  Emotional problem type Nervousness/Anxiety;Adjusting to illness  Physical Problem type Tingling hands/feet  Referral to support programs Yes    [name] [name] and her husband will celebrate their 54th anniversary later this month. He has mild Alzheimer's. Their son and his 73yo daughter live with them. Provided empathic listening and normalization of feelings.  Follow up needed: No. [name] [name] is aware of ongoing [place] team/programming availability, but please also page if needs arise or circumstances change.   [address], [place], [place] Pager [phone] Voicemail [phone]

## 2019-08-01 NOTE — Progress Notes (Signed)
REFERRING PROVIDER: Chauncey Cruel, MD [address] [place],  [place] [address]  PRIMARY PROVIDER:  Janie Morning, DO  PRIMARY REASON FOR VISIT:  1. Malignant neoplasm of upper-inner quadrant of right breast in female, estrogen receptor positive ([place])   2. Family history of breast cancer   3. Family history of colon cancer      I connected with Ms. Boissonneault on 08/01/2019 at 3:15 pm EDT by video conference and verified that I am speaking with the correct person using two identifiers.   Patient location: [facility] clinic Provider location: [facility] office  HISTORY OF PRESENT ILLNESS:   Ms. Mitchner, a 74 y.o. female, was seen for a [facility] cancer genetics consultation at the request of Dr. Jana Hakim due to a personal and family history of cancer, and a family history of Ashkenazi Jewish ancestry.  Ms. Feng presents to clinic today to discuss the possibility of a hereditary predisposition to cancer, genetic testing, and to further clarify her future cancer risks, as well as potential cancer risks for family members.   In June 2021, at the age of 56, Ms. Ige was diagnosed with invasive ductal carcinoma with DCIS, ER+/PR+/Her2-, of the right breast. The treatment plan includes surgery, oncotype, adjuvant radiation therapy as appropriate, and antiestrogen therapy.    RISK FACTORS:  Menarche was at age 32.  First live birth at age 89.  OCP use: yes.  Ovaries intact: yes.  Hysterectomy: no.  Menopausal status: postmenopausal.  HRT use: a few years. Colonoscopy: yes; 09/01/2011 - no polyps. Mammogram within the last year: yes.   Past Medical History:  Diagnosis Date  . Anxiety    being weaned off lithium will be off on 06/23/11  . Diverticulosis 09/01/2011   mild, left sided  . Family history of breast cancer   . Family history of colon cancer   . Hyperlipidemia   . Hypothyroidism   . Leg cramps   . Numbness and tingling in left arm   . Osteopenia   . Stage 3  chronic kidney disease     Past Surgical History:  Procedure Laterality Date  . COLONOSCOPY  06/09/06   Dr.Orr  . COLONOSCOPY  09/01/2011  . DILATATION & CURETTAGE/HYSTEROSCOPY WITH TRUECLEAR N/A 11/12/2013   Procedure: RESECTOSCOPIC POLYPECTOMY Irish Lack, D&C;  Surgeon: Terrance Mass, MD;  Location: [facility];  Service: Gynecology;  Laterality: N/A;  . ENDOMETRIAL BIOPSY  09/14/2013   Dr. Uvaldo Rising    Social History   Socioeconomic History  . Marital status: Married    Spouse name: Not on file  . Number of children: 2  . Years of education: Not on file  . Highest education level: Not on file  Occupational History  . Occupation: Retired  Tobacco Use  . Smoking status: Never Smoker  . Smokeless tobacco: Never Used  Vaping Use  . Vaping Use: Never used  Substance and Sexual Activity  . Alcohol use: No    Alcohol/week: 0.0 standard drinks  . Drug use: No  . Sexual activity: Never  Other Topics Concern  . Not on file  Social History Narrative   Married 1 son one daughter retired.   Social Determinants of Health   Financial Resource Strain:   . Difficulty of Paying Living Expenses:   Food Insecurity:   . Worried About Charity fundraiser in the Last Year:   . [place] in the Last Year:   Transportation Needs:   . Lack of  Transportation (Medical):   [name] Lack of Transportation (Non-Medical):   Physical Activity:   . Days of Exercise per Week:   . Minutes of Exercise per Session:   Stress:   . Feeling of Stress :   Social Connections:   . Frequency of Communication with Friends and Family:   . Frequency of Social Gatherings with Friends and Family:   . Attends Religious Services:   . Active Member of Clubs or Organizations:   . Attends Archivist Meetings:   [name] Marital Status:      FAMILY HISTORY:  We obtained a detailed, 4-generation family history.  Significant diagnoses are listed below: Family History  Problem Relation Age of Onset  .  Diabetes Mother   . Colon cancer Father 52  . Parkinson's disease Sister 12  . Alzheimer's disease Maternal Grandfather   . Breast cancer Cousin        dx. in her 72s (maternal cousin)  . Ovarian cancer Neg Hx   . Colon polyps Neg Hx    Ms. Arabie has a son and a daughter who are in their 57s. Her daughter had skin cancer removed a few years ago. Ms. Maestre has one sister who is 53 and has not had cancer, but was recently diagnosed with Parkinson's disease.  Ms. Schwenn mother died at the age of 21 and did not have cancer, although she had diabetes and tumors in her brain (possibly meningioma). Ms. Hass had two maternal uncles, neither of whom had cancer. She has a maternal cousin who had breast cancer diagnosed in her 36s. Ms. Quarry maternal grandmother died at the age of 43 and her maternal grandfather died in his 70s. There are no other known diagnoses of cancer on the maternal side of the family.  Ms. Shambaugh's father died at the age of 59 and had a history of stage IV colon cancer diagnosed at the age of 79. She notes that her father did not go to the doctor often. She does not know if she had any paternal aunts or uncles. Her paternal grandparents died when they were older than 61 and did not have cancer. There are no other known diagnoses of cancer on the paternal side of the family.  Ms. Glore is unaware of previous family history of genetic testing for hereditary cancer risks. Patient's maternal ancestors are of [place] descent, and paternal ancestors are of unknown descent. There is reported Ashkenazi Jewish ancestry - presumed due to her maternal grandmother being [place] and from [place]. There is no known consanguinity.  GENETIC COUNSELING ASSESSMENT: Ms. Rottman is a 74 y.o. female with a personal and family history of breast cancer and presumed Ashkenazi Jewish ancestry, which is somewhat suggestive of a hereditary cancer syndrome and predisposition to  cancer. We, therefore, discussed and recommended the following at today's visit.   DISCUSSION:  We discussed that approximately 1 in 53 individuals with Ashkenazi Jewish ancestry will have a hereditary cancer syndrome due to a genetic mutation in one of the BRCA genes. The BRCA genes are associated mainly with an increased risk for breast, ovarian, and prostate cancer. There are other genes that can be associated with hereditary breast cancer syndromes. These include ATM, CHEK2, PALB2, etc.  We discussed that testing is beneficial for several reasons, including knowing about other cancer risks, identifying potential screening and risk-reduction options that may be appropriate, and to understand if other family members could be at risk for cancer and allow them to undergo genetic  testing. Ms. Bauer clearly stated that the results from genetic testing would not influence her surgery decision.  We reviewed the characteristics, features and inheritance patterns of hereditary cancer syndromes. We also discussed genetic testing, including the appropriate family members to test, the process of testing, insurance coverage and turn-around-time for results. We discussed the implications of a negative, positive and/or variant of uncertain significant result. We recommended Ms. Sun pursue genetic testing for the [facility] Common Hereditary Cancers panel.   The Common Hereditary Cancers Panel offered by [facility] includes sequencing and/or deletion duplication testing of the following 48 genes: APC, ATM, AXIN2, BARD1, BMPR1A, BRCA1, BRCA2, BRIP1, CDH1, CDK4, CDKN2A (p14ARF), CDKN2A (p16INK4a), CHEK2, CTNNA1, DICER1, EPCAM (Deletion/duplication testing only), GREM1 (promoter region deletion/duplication testing only), KIT, MEN1, MLH1, MSH2, MSH3, MSH6, MUTYH, NBN, NF1, NHTL1, PALB2, PDGFRA, PMS2, POLD1, POLE, PTEN, RAD50, RAD51C, RAD51D, RNF43, SDHB, SDHC, SDHD, SMAD4, SMARCA4. STK11, TP53, TSC1, TSC2, and VHL.  The  following genes are evaluated for sequence changes only: SDHA and HOXB13 c.251G>A variant only.   Based on Ms. Lauter's personal and family history of cancer, she meets medical criteria for genetic testing. Despite that she meets criteria, she may still have an out of pocket cost. We discussed that if her out of pocket cost for testing is over $100, the laboratory will reach out to let her know. If the out of pocket cost of testing is less than $100 she will be billed by the genetic testing laboratory.   PLAN: After considering the risks, benefits, and limitations, Ms. Cleaver provided informed consent to pursue genetic testing and the blood sample was sent to [facility] for analysis of the Common Hereditary Cancers Panel. Results should be available within approximately two-three weeks' time, at which point they will be disclosed by telephone to Ms. Dewald, as will any additional recommendations warranted by these results. Ms. Colclasure will receive a summary of her genetic counseling visit and a copy of her results once available. This information will also be available in Epic.   Ms. Sebree questions were answered to her satisfaction today. Our contact information was provided should additional questions or concerns arise. Thank you for the referral and allowing [place] to share in the care of your patient.   Clint Guy, [place], [facility] Licensed, Certified Dispensing [email or web address] Phone: [phone]  The patient was seen for a total of 30 minutes in face-to-face genetic counseling.  This patient was discussed with Drs. Magrinat, Lindi Adie and/or Burr Medico who agrees with the above.    _______________________________________________________________________ For Office Staff:  Number of people involved in session: 1 Was an Intern/ student involved with case: no

## 2019-08-02 ENCOUNTER — Telehealth: Payer: Self-pay | Admitting: Oncology

## 2019-08-02 NOTE — Telephone Encounter (Signed)
Scheduled appts per 6/16 los. Pt confirmed appt date and time.

## 2019-08-06 ENCOUNTER — Telehealth: Payer: Self-pay | Admitting: Genetic Counselor

## 2019-08-06 ENCOUNTER — Encounter: Payer: Self-pay | Admitting: Genetic Counselor

## 2019-08-06 NOTE — Telephone Encounter (Addendum)
[name] [name] provided updated information about her family history. She notes that her maternal cousin with breast cancer was diagnosed at the age of 55 rather than in her 47s. Her daughter had a tiny meningioma detected outside of her brain several years ago. A relative confirmed that she has Ashkenazi [place] ancestry, supported by her maternal grandmother having been from [place].   We also reviewed and updated her socioeconomic and social history information.

## 2019-08-06 NOTE — Telephone Encounter (Signed)
Returned Ms. Dimino's phone call, LVM requesting that she call back.

## 2019-08-09 ENCOUNTER — Telehealth: Payer: Self-pay | Admitting: Genetic Counselor

## 2019-08-09 ENCOUNTER — Encounter: Payer: Self-pay | Admitting: *Deleted

## 2019-08-09 NOTE — Progress Notes (Signed)
Left message to follow from [facility].

## 2019-08-09 NOTE — Telephone Encounter (Signed)
LVM that her genetic test results are available and requested that she call back to discuss them.  

## 2019-08-10 ENCOUNTER — Encounter: Payer: Self-pay | Admitting: Genetic Counselor

## 2019-08-10 ENCOUNTER — Ambulatory Visit: Payer: Self-pay | Admitting: Genetic Counselor

## 2019-08-10 DIAGNOSIS — Z1379 Encounter for other screening for genetic and chromosomal anomalies: Secondary | ICD-10-CM

## 2019-08-10 NOTE — Progress Notes (Signed)
HPI:  [name] [name] was previously seen in the [place] clinic due to a personal and family history of cancer and concerns regarding a hereditary predisposition to cancer. Please refer to our prior cancer genetics clinic note for more information regarding our discussion, assessment and recommendations, at the time. [name] [name] recent genetic test results were disclosed to her, as were recommendations warranted by these results. These results and recommendations are discussed in more detail below.  CANCER HISTORY:  Oncology History  Malignant neoplasm of upper-inner quadrant of right breast in female, estrogen receptor positive ([place])  07/30/2019 Initial Diagnosis   Malignant neoplasm of upper-inner quadrant of right breast in female, estrogen receptor positive ([place])   08/07/2019 Genetic Testing   Negative genetic testing:  No pathogenic variants detected on the [facility] Common Hereditary Cancers Panel. The report date is 08/07/2019.   The Common Hereditary Cancers Panel offered by [facility] includes sequencing and/or deletion duplication testing of the following 48 genes: APC, ATM, AXIN2, BARD1, BMPR1A, BRCA1, BRCA2, BRIP1, CDH1, CDK4, CDKN2A (p14ARF), CDKN2A (p16INK4a), CHEK2, CTNNA1, DICER1, EPCAM (Deletion/duplication testing only), GREM1 (promoter region deletion/duplication testing only), KIT, MEN1, MLH1, MSH2, MSH3, MSH6, MUTYH, NBN, NF1, NHTL1, PALB2, PDGFRA, PMS2, POLD1, POLE, PTEN, RAD50, RAD51C, RAD51D, RNF43, SDHB, SDHC, SDHD, SMAD4, SMARCA4. STK11, TP53, TSC1, TSC2, and VHL.  The following genes were evaluated for sequence changes only: SDHA and HOXB13 c.251G>A variant only.      FAMILY HISTORY:  We obtained a detailed, 4-generation family history.  Significant diagnoses are listed below: Family History  Problem Relation Age of Onset  . Diabetes Mother   . Other Mother        meningioma (multiple)  . Colon cancer Father 41  . Parkinson's disease Sister 51  .  Alzheimer's disease Maternal Grandfather   . Breast cancer Cousin 56       maternal cousin  . Other Daughter        tiny meningioma outside of the brain  . Ovarian cancer Neg Hx   . Colon polyps Neg Hx     [name] [name] has a son and a daughter who are in their 64s. Her daughter had skin cancer removed a few years ago. [name] [name] has one sister who is 76 and has not had cancer, but was recently diagnosed with Parkinson's disease.  [name] [name] mother died at the age of 54 and did not have cancer, although she had diabetes and tumors in her brain (possibly meningioma). [name] [name] had two maternal uncles, neither of whom had cancer. She has a maternal cousin who had breast cancer diagnosed at the age of 1. [name] [name] maternal grandmother died at the age of 59 and her maternal grandfather died in his 44s. There are no other known diagnoses of cancer on the maternal side of the family.  [name] [name]'s father died at the age of 23 and had a history of stage IV colon cancer diagnosed at the age of 44. She notes that her father did not go to the doctor often. She does not know if she had any paternal aunts or uncles. Her paternal grandparents died when they were older than 77 and did not have cancer. There are no other known diagnoses of cancer on the paternal side of the family.  [name] [name] is unaware of previous family history of genetic testing for hereditary cancer risks. Patient's maternal ancestors are of [place] descent, and paternal ancestors are of unknown descent. There is reported Ashkenazi Jewish ancestry -  presumed due to her maternal grandmother being [place] and from [place]. There is no known consanguinity.  GENETIC TEST RESULTS: Genetic testing reported out on 08/07/2019 through the [facility] Common Hereditary Cancers panel. No pathogenic variants were detected.   The Common Hereditary Cancers Panel offered by [facility] includes sequencing and/or deletion duplication  testing of the following 48 genes: APC, ATM, AXIN2, BARD1, BMPR1A, BRCA1, BRCA2, BRIP1, CDH1, CDK4, CDKN2A (p14ARF), CDKN2A (p16INK4a), CHEK2, CTNNA1, DICER1, EPCAM (Deletion/duplication testing only), GREM1 (promoter region deletion/duplication testing only), KIT, MEN1, MLH1, MSH2, MSH3, MSH6, MUTYH, NBN, NF1, NHTL1, PALB2, PDGFRA, PMS2, POLD1, POLE, PTEN, RAD50, RAD51C, RAD51D, RNF43, SDHB, SDHC, SDHD, SMAD4, SMARCA4. STK11, TP53, TSC1, TSC2, and VHL.  The following genes were evaluated for sequence changes only: SDHA and HOXB13 c.251G>A variant only. The test report will be scanned into EPIC and located under the Molecular Pathology section of the Results Review tab.  A portion of the result report is included below for reference.     We discussed with [name] [name] that because current genetic testing is not perfect, it is possible there may be a gene mutation in one of these genes that current testing cannot detect, but that chance is small.  We also discussed, that there could be another gene that has not yet been discovered, or that we have not yet tested, that is responsible for the cancer diagnoses in the family. It is also possible there is a hereditary cause for the cancer in the family that [name] [name] did not inherit and therefore was not identified in her testing.  Therefore, it is important to remain in touch with cancer genetics in the future so that we can continue to offer [name] [name] the most up to date genetic testing.   CANCER SCREENING RECOMMENDATIONS: [name] [name] test result is considered negative (normal).  This means that we have not identified a hereditary cause for her personal and family history of cancer at this time. Most cancers happen by chance and this negative test suggests that her personal and family of cancer may fall into this category.    While reassuring, this does not definitively rule out a hereditary predisposition to cancer. It is still possible that there  could be genetic mutations that are undetectable by current technology. There could be genetic mutations in genes that have not been tested or identified to increase cancer risk.  Therefore, it is recommended she continue to follow the cancer management and screening guidelines provided by her oncology and primary healthcare providers.   An individual's cancer risk and medical management are not determined by genetic test results alone. Overall cancer risk assessment incorporates additional factors, including personal medical history, family history, and any available genetic information that may result in a personalized plan for cancer prevention and surveillance.  RECOMMENDATIONS FOR FAMILY MEMBERS:  Individuals in this family might be at some increased risk of developing cancer, over the general population risk, simply due to the family history of cancer.  We recommended women in this family have a yearly mammogram beginning at age 64, or 10 years younger than the earliest onset of cancer, an annual clinical breast exam, and perform monthly breast self-exams. Women in this family should also have a gynecological exam as recommended by their primary provider. All family members should be referred for colonoscopy starting at age 27.  FOLLOW-UP: Lastly, we discussed with [name] [name] that cancer genetics is a rapidly advancing field and it is possible that new genetic tests will be appropriate  for her and/or her family members in the future. We encouraged her to remain in contact with cancer genetics on an annual basis so we can update her personal and family histories and let her know of advances in cancer genetics that may benefit this family.   Our contact number was provided. [name] [name] questions were answered to her satisfaction, and she knows she is welcome to call us at anytime with additional questions or concerns.   Clint Guy, MS, [facility] Genetic  [email or web address] Phone: [phone]

## 2019-08-10 NOTE — Telephone Encounter (Signed)
Revealed negative genetic testing. Discussed that we do not know why she has breast cancer or why there is cancer in the family. It is possible that there could be a mutation in a different gene that we are not testing, or our current technology may not be able detect certain mutations. It will therefore be important for her to stay in contact with genetics to keep up with whether additional testing may be appropriate in the future.     

## 2019-08-22 ENCOUNTER — Other Ambulatory Visit: Payer: Self-pay

## 2019-08-22 ENCOUNTER — Encounter (HOSPITAL_BASED_OUTPATIENT_CLINIC_OR_DEPARTMENT_OTHER): Payer: Self-pay | Admitting: Surgery

## 2019-08-24 ENCOUNTER — Encounter (HOSPITAL_BASED_OUTPATIENT_CLINIC_OR_DEPARTMENT_OTHER)
Admission: RE | Admit: 2019-08-24 | Discharge: 2019-08-24 | Disposition: A | Discharge status: Home / Self Care | Payer: Medicare Other | Source: Ambulatory Visit | Attending: Surgery | Admitting: Surgery

## 2019-08-24 DIAGNOSIS — Z01812 Encounter for preprocedural laboratory examination: Secondary | ICD-10-CM | POA: Insufficient documentation

## 2019-08-24 LAB — CBC WITH DIFFERENTIAL/PLATELET
Abs Immature Granulocytes: 0.05 10*3/uL (ref 0.00–0.07)
Basophils Absolute: 0 10*3/uL (ref 0.0–0.1)
Basophils Relative: 1 %
Eosinophils Absolute: 0.1 10*3/uL (ref 0.0–0.5)
Eosinophils Relative: 2 %
HCT: 37.9 % (ref 36.0–46.0)
Hemoglobin: 12.3 g/dL (ref 12.0–15.0)
Immature Granulocytes: 1 %
Lymphocytes Relative: 31 %
Lymphs Abs: 2.6 10*3/uL (ref 0.7–4.0)
MCH: 31.1 pg (ref 26.0–34.0)
MCHC: 32.5 g/dL (ref 30.0–36.0)
MCV: 95.7 fL (ref 80.0–100.0)
Monocytes Absolute: 0.6 10*3/uL (ref 0.1–1.0)
Monocytes Relative: 8 %
Neutro Abs: 4.8 10*3/uL (ref 1.7–7.7)
Neutrophils Relative %: 57 %
Platelets: 216 10*3/uL (ref 150–400)
RBC: 3.96 MIL/uL (ref 3.87–5.11)
RDW: 12 % (ref 11.5–15.5)
WBC: 8.2 10*3/uL (ref 4.0–10.5)
nRBC: 0 % (ref 0.0–0.2)

## 2019-08-24 LAB — COMPREHENSIVE METABOLIC PANEL
ALT: 17 U/L (ref 0–44)
AST: 16 U/L (ref 15–41)
Albumin: 3.6 g/dL (ref 3.5–5.0)
Alkaline Phosphatase: 82 U/L (ref 38–126)
Anion gap: 11 (ref 5–15)
BUN: 20 mg/dL (ref 8–23)
CO2: 25 mmol/L (ref 22–32)
Calcium: 9 mg/dL (ref 8.9–10.3)
Chloride: 106 mmol/L (ref 98–111)
Creatinine, Ser: 1.38 mg/dL — ABNORMAL HIGH (ref 0.44–1.00)
GFR calc Af Amer: 44 mL/min — ABNORMAL LOW (ref >60)
GFR calc non Af Amer: 38 mL/min — ABNORMAL LOW (ref >60)
Glucose, Bld: 115 mg/dL — ABNORMAL HIGH (ref 70–99)
Potassium: 3.7 mmol/L (ref 3.5–5.1)
Sodium: 142 mmol/L (ref 135–145)
Total Bilirubin: 0.9 mg/dL (ref 0.3–1.2)
Total Protein: 7.1 g/dL (ref 6.5–8.1)

## 2019-08-24 NOTE — Progress Notes (Signed)

## 2019-08-25 ENCOUNTER — Other Ambulatory Visit (HOSPITAL_COMMUNITY)
Admission: RE | Admit: 2019-08-25 | Discharge: 2019-08-25 | Disposition: A | Discharge status: Home / Self Care | Payer: Medicare Other | Source: Ambulatory Visit | Attending: Surgery | Admitting: Surgery

## 2019-08-25 ENCOUNTER — Encounter: Payer: Self-pay | Admitting: Dietician

## 2019-08-25 DIAGNOSIS — Z20822 Contact with and (suspected) exposure to covid-19: Secondary | ICD-10-CM | POA: Diagnosis not present

## 2019-08-25 DIAGNOSIS — Z01812 Encounter for preprocedural laboratory examination: Secondary | ICD-10-CM | POA: Insufficient documentation

## 2019-08-25 LAB — SARS CORONAVIRUS 2 (TAT 6-24 HRS): SARS Coronavirus 2: NEGATIVE

## 2019-08-25 NOTE — Progress Notes (Signed)
Nutrition  Patient identified after attending Breast Clinic on 08/01/2019. Patient given nutrition packet with RD contact information by nurse navigator at that time.  Chart reviewed.   Patient with estrogen and progesterone receptor positive breast cancer of right breast. Defenative breast conserving surgery pending, adjuvant radiation and antiestrogens. If Oncotype returns high, she would also receive chemotherapy.  Patient is not currently at nutrition risk. Please consult RD if nutrition issues arise.  Lajuan Lines, RD, LDN Clinical Nutrition After Hours/Weekend Pager # in [place]

## 2019-08-28 ENCOUNTER — Encounter (HOSPITAL_COMMUNITY): Payer: Medicare Other

## 2019-08-28 DIAGNOSIS — C50211 Malignant neoplasm of upper-inner quadrant of right female breast: Secondary | ICD-10-CM | POA: Diagnosis not present

## 2019-08-29 ENCOUNTER — Ambulatory Visit (HOSPITAL_BASED_OUTPATIENT_CLINIC_OR_DEPARTMENT_OTHER): Payer: Medicare Other | Admitting: Certified Registered"

## 2019-08-29 ENCOUNTER — Ambulatory Visit (HOSPITAL_BASED_OUTPATIENT_CLINIC_OR_DEPARTMENT_OTHER)
Admission: RE | Admit: 2019-08-29 | Discharge: 2019-08-29 | Disposition: A | Discharge status: Home / Self Care | Payer: Medicare Other | Attending: Surgery | Admitting: Surgery

## 2019-08-29 ENCOUNTER — Other Ambulatory Visit: Payer: Self-pay

## 2019-08-29 ENCOUNTER — Encounter (HOSPITAL_BASED_OUTPATIENT_CLINIC_OR_DEPARTMENT_OTHER): Payer: Self-pay | Admitting: Surgery

## 2019-08-29 ENCOUNTER — Ambulatory Visit (HOSPITAL_COMMUNITY)
Admission: RE | Admit: 2019-08-29 | Discharge: 2019-08-29 | Disposition: A | Discharge status: Home / Self Care | Payer: Medicare Other | Source: Ambulatory Visit | Attending: Surgery | Admitting: Surgery

## 2019-08-29 ENCOUNTER — Encounter (HOSPITAL_BASED_OUTPATIENT_CLINIC_OR_DEPARTMENT_OTHER)
Admission: RE | Disposition: A | Discharge status: Home / Self Care | Payer: Self-pay | Source: Home / Self Care | Attending: Surgery

## 2019-08-29 DIAGNOSIS — E039 Hypothyroidism, unspecified: Secondary | ICD-10-CM | POA: Diagnosis not present

## 2019-08-29 DIAGNOSIS — C50911 Malignant neoplasm of unspecified site of right female breast: Secondary | ICD-10-CM

## 2019-08-29 DIAGNOSIS — Z170 Estrogen receptor positive status [ER+]: Secondary | ICD-10-CM | POA: Diagnosis not present

## 2019-08-29 DIAGNOSIS — Z8371 Family history of colonic polyps: Secondary | ICD-10-CM | POA: Insufficient documentation

## 2019-08-29 DIAGNOSIS — F319 Bipolar disorder, unspecified: Secondary | ICD-10-CM | POA: Diagnosis not present

## 2019-08-29 DIAGNOSIS — Z6835 Body mass index (BMI) 35.0-35.9, adult: Secondary | ICD-10-CM | POA: Insufficient documentation

## 2019-08-29 DIAGNOSIS — C50211 Malignant neoplasm of upper-inner quadrant of right female breast: Secondary | ICD-10-CM | POA: Diagnosis not present

## 2019-08-29 DIAGNOSIS — Z800 Family history of malignant neoplasm of digestive organs: Secondary | ICD-10-CM | POA: Diagnosis not present

## 2019-08-29 DIAGNOSIS — F419 Anxiety disorder, unspecified: Secondary | ICD-10-CM | POA: Insufficient documentation

## 2019-08-29 DIAGNOSIS — G8918 Other acute postprocedural pain: Secondary | ICD-10-CM | POA: Diagnosis not present

## 2019-08-29 DIAGNOSIS — Z833 Family history of diabetes mellitus: Secondary | ICD-10-CM | POA: Diagnosis not present

## 2019-08-29 DIAGNOSIS — E669 Obesity, unspecified: Secondary | ICD-10-CM | POA: Diagnosis not present

## 2019-08-29 HISTORY — DX: Bipolar disorder, unspecified: F31.9

## 2019-08-29 HISTORY — PX: BREAST LUMPECTOMY WITH RADIOACTIVE SEED AND SENTINEL LYMPH NODE BIOPSY: SHX6550

## 2019-08-29 SURGERY — BREAST LUMPECTOMY WITH RADIOACTIVE SEED AND SENTINEL LYMPH NODE BIOPSY
Anesthesia: General | Site: Breast | Laterality: Right

## 2019-08-29 MED ORDER — ONDANSETRON HCL 4 MG/2ML IJ SOLN
INTRAMUSCULAR | Status: DC | PRN
  Administered 2019-08-29: 4 mg via INTRAVENOUS

## 2019-08-29 MED ORDER — TECHNETIUM TC 99M SULFUR COLLOID FILTERED
1.0000 | Freq: Once | INTRAVENOUS | Status: AC | PRN
  Administered 2019-08-29: 1 via INTRADERMAL

## 2019-08-29 MED ORDER — LIDOCAINE HCL (CARDIAC) PF 100 MG/5ML IV SOSY
PREFILLED_SYRINGE | INTRAVENOUS | Status: DC | PRN
  Administered 2019-08-29: 60 mg via INTRAVENOUS

## 2019-08-29 MED ORDER — ACETAMINOPHEN 500 MG PO TABS
ORAL_TABLET | ORAL | Status: AC
  Filled 2019-08-29: qty 2

## 2019-08-29 MED ORDER — PHENYLEPHRINE HCL (PRESSORS) 10 MG/ML IV SOLN
INTRAVENOUS | Status: DC | PRN
  Administered 2019-08-29: 80 ug via INTRAVENOUS
  Administered 2019-08-29 (×2): 40 ug via INTRAVENOUS

## 2019-08-29 MED ORDER — CHLORHEXIDINE GLUCONATE CLOTH 2 % EX PADS: 6.0000 | MEDICATED_PAD | Freq: Once | CUTANEOUS | Status: DC

## 2019-08-29 MED ORDER — SODIUM CHLORIDE (PF) 0.9 % IJ SOLN
INTRAMUSCULAR | Status: AC
  Filled 2019-08-29: qty 10

## 2019-08-29 MED ORDER — DEXAMETHASONE SODIUM PHOSPHATE 10 MG/ML IJ SOLN
INTRAMUSCULAR | Status: DC | PRN
  Administered 2019-08-29: 5 mg via INTRAVENOUS

## 2019-08-29 MED ORDER — CEFAZOLIN SODIUM-DEXTROSE 2-4 GM/100ML-% IV SOLN
2.0000 g | INTRAVENOUS | Status: AC
  Administered 2019-08-29: 2 g via INTRAVENOUS

## 2019-08-29 MED ORDER — PROPOFOL 500 MG/50ML IV EMUL
INTRAVENOUS | Status: AC
  Filled 2019-08-29: qty 50

## 2019-08-29 MED ORDER — ROPIVACAINE HCL 5 MG/ML IJ SOLN
INTRAMUSCULAR | Status: DC | PRN
  Administered 2019-08-29: 30 mL via PERINEURAL

## 2019-08-29 MED ORDER — HYDROCODONE-ACETAMINOPHEN 5-325 MG PO TABS
1.0000 | ORAL_TABLET | Freq: Four times a day (QID) | ORAL | 0 refills | Status: DC | PRN
Start: 2019-08-29 — End: 2019-10-15

## 2019-08-29 MED ORDER — PROMETHAZINE HCL 25 MG/ML IJ SOLN: 6.2500 mg | INTRAMUSCULAR | Status: DC | PRN

## 2019-08-29 MED ORDER — EPHEDRINE SULFATE 50 MG/ML IJ SOLN
INTRAMUSCULAR | Status: DC | PRN
  Administered 2019-08-29 (×2): 10 mg via INTRAVENOUS

## 2019-08-29 MED ORDER — MIDAZOLAM HCL 2 MG/2ML IJ SOLN
2.0000 mg | Freq: Once | INTRAMUSCULAR | Status: AC
  Administered 2019-08-29: 2 mg via INTRAVENOUS

## 2019-08-29 MED ORDER — BUPIVACAINE HCL (PF) 0.25 % IJ SOLN
INTRAMUSCULAR | Status: DC | PRN
Start: 2019-08-29 — End: 2019-08-29
  Administered 2019-08-29: 18 mL

## 2019-08-29 MED ORDER — PROPOFOL 10 MG/ML IV BOLUS
INTRAVENOUS | Status: DC | PRN
  Administered 2019-08-29: 150 mg via INTRAVENOUS
  Administered 2019-08-29: 30 mg via INTRAVENOUS

## 2019-08-29 MED ORDER — ROCURONIUM BROMIDE 10 MG/ML (PF) SYRINGE
PREFILLED_SYRINGE | INTRAVENOUS | Status: AC
  Filled 2019-08-29: qty 10

## 2019-08-29 MED ORDER — SODIUM CHLORIDE (PF) 0.9 % IJ SOLN
INTRAVENOUS | Status: DC | PRN
  Administered 2019-08-29: 5 mL

## 2019-08-29 MED ORDER — FENTANYL CITRATE (PF) 100 MCG/2ML IJ SOLN
100.0000 ug | Freq: Once | INTRAMUSCULAR | Status: AC
  Administered 2019-08-29: 50 ug via INTRAVENOUS

## 2019-08-29 MED ORDER — GABAPENTIN 300 MG PO CAPS
ORAL_CAPSULE | ORAL | Status: AC
  Filled 2019-08-29: qty 1

## 2019-08-29 MED ORDER — OXYCODONE HCL 5 MG/5ML PO SOLN: 5.0000 mg | Freq: Once | ORAL | Status: DC | PRN

## 2019-08-29 MED ORDER — AMISULPRIDE (ANTIEMETIC) 5 MG/2ML IV SOLN: 10.0000 mg | Freq: Once | INTRAVENOUS | Status: DC | PRN

## 2019-08-29 MED ORDER — LIDOCAINE 2% (20 MG/ML) 5 ML SYRINGE
INTRAMUSCULAR | Status: AC
  Filled 2019-08-29: qty 10

## 2019-08-29 MED ORDER — CEFAZOLIN SODIUM-DEXTROSE 2-4 GM/100ML-% IV SOLN
INTRAVENOUS | Status: AC
  Filled 2019-08-29: qty 100

## 2019-08-29 MED ORDER — FENTANYL CITRATE (PF) 100 MCG/2ML IJ SOLN
INTRAMUSCULAR | Status: DC | PRN
  Administered 2019-08-29 (×3): 25 ug via INTRAVENOUS

## 2019-08-29 MED ORDER — OXYCODONE HCL 5 MG PO TABS: 5.0000 mg | ORAL_TABLET | Freq: Once | ORAL | Status: DC | PRN

## 2019-08-29 MED ORDER — MIDAZOLAM HCL 2 MG/2ML IJ SOLN
INTRAMUSCULAR | Status: AC
  Filled 2019-08-29: qty 2

## 2019-08-29 MED ORDER — GABAPENTIN 300 MG PO CAPS: 300.0000 mg | ORAL_CAPSULE | ORAL | Status: DC

## 2019-08-29 MED ORDER — ACETAMINOPHEN 500 MG PO TABS
1000.0000 mg | ORAL_TABLET | ORAL | Status: AC
  Administered 2019-08-29: 1000 mg via ORAL

## 2019-08-29 MED ORDER — FENTANYL CITRATE (PF) 100 MCG/2ML IJ SOLN
INTRAMUSCULAR | Status: AC
  Filled 2019-08-29: qty 2

## 2019-08-29 MED ORDER — HYDROMORPHONE HCL 1 MG/ML IJ SOLN: 0.2500 mg | INTRAMUSCULAR | Status: DC | PRN

## 2019-08-29 MED ORDER — METHYLENE BLUE 0.5 % INJ SOLN
INTRAVENOUS | Status: AC
  Filled 2019-08-29: qty 10

## 2019-08-29 MED ORDER — LACTATED RINGERS IV SOLN: INTRAVENOUS | Status: DC

## 2019-08-29 SURGICAL SUPPLY — 46 items
APPLIER CLIP 9.375 MED OPEN (MISCELLANEOUS) ×2
BINDER BREAST XXLRG (GAUZE/BANDAGES/DRESSINGS) ×2 IMPLANT
BLADE SURG 15 STRL LF DISP TIS (BLADE) ×1 IMPLANT
BLADE SURG 15 STRL SS (BLADE) ×2
CANISTER SUC SOCK COL 7IN (MISCELLANEOUS) IMPLANT
CANISTER SUCT 1200ML W/VALVE (MISCELLANEOUS) ×2 IMPLANT
CHLORAPREP W/TINT 26 (MISCELLANEOUS) ×2 IMPLANT
CLIP APPLIE 9.375 MED OPEN (MISCELLANEOUS) ×1 IMPLANT
COVER BACK TABLE 60X90IN (DRAPES) ×2 IMPLANT
COVER MAYO STAND STRL (DRAPES) ×2 IMPLANT
COVER PROBE W GEL 5X96 (DRAPES) ×2 IMPLANT
COVER WAND RF STERILE (DRAPES) IMPLANT
DECANTER SPIKE VIAL GLASS SM (MISCELLANEOUS) IMPLANT
DERMABOND ADVANCED (GAUZE/BANDAGES/DRESSINGS) ×1
DERMABOND ADVANCED .7 DNX12 (GAUZE/BANDAGES/DRESSINGS) ×1 IMPLANT
DRAPE LAPAROSCOPIC ABDOMINAL (DRAPES) ×2 IMPLANT
DRAPE UTILITY XL STRL (DRAPES) ×2 IMPLANT
ELECT COATED BLADE 2.86 ST (ELECTRODE) ×2 IMPLANT
ELECT REM PT RETURN 9FT ADLT (ELECTROSURGICAL) ×2
ELECTRODE REM PT RTRN 9FT ADLT (ELECTROSURGICAL) ×1 IMPLANT
GLOVE BIOGEL PI IND STRL 7.0 (GLOVE) ×1 IMPLANT
GLOVE BIOGEL PI IND STRL 8 (GLOVE) ×1 IMPLANT
GLOVE BIOGEL PI INDICATOR 7.0 (GLOVE) ×1
GLOVE BIOGEL PI INDICATOR 8 (GLOVE) ×1
GLOVE ECLIPSE 8.0 STRL XLNG CF (GLOVE) ×2 IMPLANT
GLOVE SURG SS PI 7.0 STRL IVOR (GLOVE) ×2 IMPLANT
GOWN STRL REUS W/ TWL LRG LVL3 (GOWN DISPOSABLE) ×2 IMPLANT
GOWN STRL REUS W/TWL LRG LVL3 (GOWN DISPOSABLE) ×4
HEMOSTAT ARISTA ABSORB 3G PWDR (HEMOSTASIS) IMPLANT
HEMOSTAT SNOW SURGICEL 2X4 (HEMOSTASIS) IMPLANT
KIT MARKER MARGIN INK (KITS) ×2 IMPLANT
NDL SAFETY ECLIPSE 18X1.5 (NEEDLE) IMPLANT
NEEDLE HYPO 18GX1.5 SHARP (NEEDLE)
NEEDLE HYPO 25X1 1.5 SAFETY (NEEDLE) ×2 IMPLANT
NS IRRIG 1000ML POUR BTL (IV SOLUTION) ×2 IMPLANT
PACK BASIN DAY SURGERY FS (CUSTOM PROCEDURE TRAY) ×2 IMPLANT
PENCIL SMOKE EVACUATOR (MISCELLANEOUS) ×2 IMPLANT
SLEEVE SCD COMPRESS KNEE MED (MISCELLANEOUS) ×2 IMPLANT
SPONGE LAP 4X18 RFD (DISPOSABLE) ×2 IMPLANT
SUT MNCRL AB 4-0 PS2 18 (SUTURE) ×2 IMPLANT
SUT VICRYL 3-0 CR8 SH (SUTURE) ×2 IMPLANT
SYR CONTROL 10ML LL (SYRINGE) ×2 IMPLANT
TOWEL GREEN STERILE FF (TOWEL DISPOSABLE) ×2 IMPLANT
TRAY FAXITRON CT DISP (TRAY / TRAY PROCEDURE) ×2 IMPLANT
TUBE CONNECTING 20X1/4 (TUBING) ×2 IMPLANT
YANKAUER SUCT BULB TIP NO VENT (SUCTIONS) ×2 IMPLANT

## 2019-08-29 NOTE — Progress Notes (Signed)
Nuc med injections completed. Patient tolerated well.   

## 2019-08-29 NOTE — Discharge Instructions (Signed)
Tylenol was given at 9:55 this morning. Next dose not until 3:55 this afternoon if needed.   [place] Office Phone Number [phone]  BREAST BIOPSY/ PARTIAL MASTECTOMY: POST OP INSTRUCTIONS  Always review your discharge instruction sheet given to you by the facility where your surgery was performed.  IF YOU HAVE DISABILITY OR FAMILY LEAVE FORMS, YOU MUST BRING THEM TO THE OFFICE FOR PROCESSING.  DO NOT GIVE THEM TO YOUR DOCTOR.  1. A prescription for pain medication may be given to you upon discharge.  Take your pain medication as prescribed, if needed.  If narcotic pain medicine is not needed, then you may take acetaminophen (Tylenol) or ibuprofen (Advil) as needed. 2. Take your usually prescribed medications unless otherwise directed 3. If you need a refill on your pain medication, please contact your pharmacy.  They will contact our office to request authorization.  Prescriptions will not be filled after 5pm or on week-ends. 4. You should eat very light the first 24 hours after surgery, such as soup, crackers, pudding, etc.  Resume your normal diet the day after surgery. 5. Most patients will experience some swelling and bruising in the breast.  Ice packs and a good support bra will help.  Swelling and bruising can take several days to resolve.  6. It is common to experience some constipation if taking pain medication after surgery.  Increasing fluid intake and taking a stool softener will usually help or prevent this problem from occurring.  A mild laxative (Milk of Magnesia or Miralax) should be taken according to package directions if there are no bowel movements after 48 hours. 7. Unless discharge instructions indicate otherwise, you may remove your bandages 24-48 hours after surgery, and you may shower at that time.  You may have steri-strips (small skin tapes) in place directly over the incision.  These strips should be left on the skin for 7-10 days.  If your surgeon  used skin glue on the incision, you may shower in 24 hours.  The glue will flake off over the next 2-3 weeks.  Any sutures or staples will be removed at the office during your follow-up visit. 8. ACTIVITIES:  You may resume regular daily activities (gradually increasing) beginning the next day.  Wearing a good support bra or sports bra minimizes pain and swelling.  You may have sexual intercourse when it is comfortable. a. You may drive when you no longer are taking prescription pain medication, you can comfortably wear a seatbelt, and you can safely maneuver your car and apply brakes. b. RETURN TO WORK:  ______________________________________________________________________________________ 9. You should see your doctor in the office for a follow-up appointment approximately two weeks after your surgery.  Your doctor's nurse will typically make your follow-up appointment when she calls you with your pathology report.  Expect your pathology report 2-3 business days after your surgery.  You may call to check if you do not hear from [place] after three days. 10. OTHER INSTRUCTIONS: _______________________________________________________________________________________________ _____________________________________________________________________________________________________________________________________ _____________________________________________________________________________________________________________________________________ _____________________________________________________________________________________________________________________________________  WHEN TO CALL YOUR DOCTOR: 1. Fever over 101.0 2. Nausea and/or vomiting. 3. Extreme swelling or bruising. 4. Continued bleeding from incision. 5. Increased pain, redness, or drainage from the incision.  The clinic staff is available to answer your questions during regular business hours.  Please don't hesitate to call and ask to speak to one  of the nurses for clinical concerns.  If you have a medical emergency, go to the nearest emergency room or call 911.  A surgeon from  [facility] is always on call at the hospital.  For further questions, please visit [email or web address]    Post Anesthesia Home Care Instructions  Activity: Get plenty of rest for the remainder of the day. A responsible individual must stay with you for 24 hours following the procedure.  For the next 24 hours, DO NOT: -Drive a car -Paediatric nurse -Drink alcoholic beverages -Take any medication unless instructed by your physician -Make any legal decisions or sign important papers.  Meals: Start with liquid foods such as gelatin or soup. Progress to regular foods as tolerated. Avoid greasy, spicy, heavy foods. If nausea and/or vomiting occur, drink only clear liquids until the nausea and/or vomiting subsides. Call your physician if vomiting continues.  Special Instructions/Symptoms: Your throat may feel dry or sore from the anesthesia or the breathing tube placed in your throat during surgery. If this causes discomfort, gargle with warm salt water. The discomfort should disappear within 24 hours.  If you had a scopolamine patch placed behind your ear for the management of post- operative nausea and/or vomiting:  1. The medication in the patch is effective for 72 hours, after which it should be removed.  Wrap patch in a tissue and discard in the trash. Wash hands thoroughly with soap and water. 2. You may remove the patch earlier than 72 hours if you experience unpleasant side effects which may include dry mouth, dizziness or visual disturbances. 3. Avoid touching the patch. Wash your hands with soap and water after contact with the patch.    Regional Anesthesia Blocks  1. Numbness or the inability to move the "blocked" extremity may last from 3-48 hours after placement. The length of time depends on the medication injected and your  individual response to the medication. If the numbness is not going away after 48 hours, call your surgeon.  2. The extremity that is blocked will need to be protected until the numbness is gone and the  Strength has returned. Because you cannot feel it, you will need to take extra care to avoid injury. Because it may be weak, you may have difficulty moving it or using it. You may not know what position it is in without looking at it while the block is in effect.  3. For blocks in the legs and feet, returning to weight bearing and walking needs to be done carefully. You will need to wait until the numbness is entirely gone and the strength has returned. You should be able to move your leg and foot normally before you try and bear weight or walk. You will need someone to be with you when you first try to ensure you do not fall and possibly risk injury.  4. Bruising and tenderness at the needle site are common side effects and will resolve in a few days.  5. Persistent numbness or new problems with movement should be communicated to the surgeon or the [place] [phone] [place] [phone]).

## 2019-08-29 NOTE — Anesthesia Preprocedure Evaluation (Signed)
Anesthesia Evaluation  Patient identified by MRN, date of birth, ID band Patient awake    Reviewed: Allergy & Precautions, H&P , NPO status , Patient's Chart, lab work & pertinent test results  Airway Mallampati: II  TM Distance: >3 FB Neck ROM: full    Dental no notable dental hx. (+) Teeth Intact   Pulmonary neg pulmonary ROS,    Pulmonary exam normal        Cardiovascular negative cardio ROS Normal cardiovascular exam     Neuro/Psych PSYCHIATRIC DISORDERS Anxiety Bipolar Disorder negative neurological ROS     GI/Hepatic negative GI ROS, Neg liver ROS,   Endo/Other  Hypothyroidism   Renal/GU Renal InsufficiencyRenal disease     Musculoskeletal   Abdominal Normal abdominal exam  (+) + obese,   Peds  Hematology negative hematology ROS (+)   Anesthesia Other Findings Breast Cancer  Reproductive/Obstetrics negative OB ROS                             Anesthesia Physical  Anesthesia Plan  ASA: III  Anesthesia Plan: General   Post-op Pain Management:  Regional for Post-op pain   Induction: Intravenous  PONV Risk Score and Plan: 3 and Ondansetron, Dexamethasone, Midazolam and Treatment may vary due to age or medical condition  Airway Management Planned: LMA  Additional Equipment:   Intra-op Plan:   Post-operative Plan: Extubation in OR  Informed Consent: I have reviewed the patients History and Physical, chart, labs and discussed the procedure including the risks, benefits and alternatives for the proposed anesthesia with the patient or authorized representative who has indicated his/her understanding and acceptance.       Plan Discussed with: CRNA, Surgeon and Anesthesiologist  Anesthesia Plan Comments:         Anesthesia Quick Evaluation

## 2019-08-29 NOTE — Op Note (Signed)
Preoperative diagnosis: Right breast cancer stage I upper inner quadrant  Postoperative diagnosis: Same  Procedure: Right breast seed localized lumpectomy with right axillary sentinel lymph node mapping with methylene blue dye  Surgeon: Erroll Luna, MD  Anesthesia: General with pectoral block and 0.25% Marcaine plain  EBL: Minimal  Specimen: Right breast tissue with grossly negative margins to pathology with seed and clip verified by Faxitron  2 right axillary sentinel nodes blue and hot to pathology    Drains: None  Indications for procedure: The patient 74 year old female with stage I right breast cancer upper inner quadrant.  She is up for breast conserving surgery and was seen by medical radiation oncology as well.The procedure has been discussed with the patient. Alternatives to surgery have been discussed with the patient.  Risks of surgery include bleeding,  Infection,  Seroma formation, death,  and the need for further surgery.   The patient understands and wishes to proceed.Sentinel lymph node mapping and dissection has been discussed with the patient.  Risk of bleeding,  Infection,  Seroma formation,  Additional procedures,,  Shoulder weakness , lymphedema , shoulder stiffness,  Nerve and blood vessel injury and reaction to the mapping dyes have been discussed.  Alternatives to surgery have been discussed with the patient.  The patient agrees to proceed.   Description of procedure: The patient was met in the holding area and questions were answered.  She underwent right pectoral block per anesthesia and injection of right breast with technetium sulfur colloid.  Neoprobe used to verify seed location.  She was taken back to the operating.  She is placed supine upon the OR table.  After induction of general esthesia, the right breast was prepped and draped in sterile fashion timeout performed.  She received appropriate antibiotics.  Neoprobe was used to identify the seed in the right  breast.  This was more medial.  Transverse incision was made dissection was carried down around the seed and clip to excise all tissue with a grossly negative margin.  Hemostasis achieved.  Faxitron revealed the seed and clip to be in the center specimen and a photograph was taken for the chart.  Cavity is irrigated made hemostatic with cautery.  Was closed with 3-0 Vicryl and 4 Monocryl after placing clips.  Dermabond applied.  Next, 4 cc of methylene blue dye were injected in a subareolar position.  This was massaged for 5 minutes.  She had very poor uptake the technetium therefore dye was used to increase localization accuracy.  A week hotspot identified in the right axilla.  Dissection was carried down after incision was made the right axilla and the level 1 contents.  She had 2 blue weakly hot sentinel nodes removed.  Background counts approached 0 and there is no additional blue dye uptake in the right axilla.  Hemostasis achieved with cautery.  Wound was irrigated.  Wound closed with 3-0 Vicryl and 4 Monocryl.  Dermabond was applied.  All counts were found to be correct.  Breast binder was placed.  The patient was awoke extubated taken recovery in satisfactory condition.

## 2019-08-29 NOTE — Transfer of Care (Signed)
Immediate Anesthesia Transfer of Care Note  Patient: [name] [name]  Procedure(s) Performed: RIGHT BREAST LUMPECTOMY WITH RADIOACTIVE SEED AND SENTINEL LYMPH NODE BIOPSY (Right Breast)  Patient Location: PACU  Anesthesia Type:GA combined with regional for post-op pain  Level of Consciousness: sedated, patient cooperative and responds to stimulation  Airway & Oxygen Therapy: Patient Spontanous Breathing and Patient connected to face mask oxygen  Post-op Assessment: Report given to RN, Post -op Vital signs reviewed and stable and Patient moving all extremities  Post vital signs: Reviewed and stable  Last Vitals:  Vitals Value Taken Time  BP 125/44 08/29/19 1150  Temp    Pulse 75 08/29/19 1152  Resp 11 08/29/19 1152  SpO2 100 % 08/29/19 1152  Vitals shown include unvalidated device data.  Last Pain:  Vitals:   08/29/19 0951  TempSrc: Oral  PainSc: 0-No pain      Patients Stated Pain Goal: 3 (03/70/96 4383)  Complications: No complications documented.

## 2019-08-29 NOTE — Anesthesia Postprocedure Evaluation (Signed)
Anesthesia Post Note  Patient: [name] [name]  Procedure(s) Performed: RIGHT BREAST LUMPECTOMY WITH RADIOACTIVE SEED AND SENTINEL LYMPH NODE BIOPSY (Right Breast)     Patient location during evaluation: PACU Anesthesia Type: General Level of consciousness: awake and alert Pain management: pain level controlled Vital Signs Assessment: post-procedure vital signs reviewed and stable Respiratory status: spontaneous breathing, nonlabored ventilation and respiratory function stable Cardiovascular status: blood pressure returned to baseline and stable Postop Assessment: no apparent nausea or vomiting Anesthetic complications: no   No complications documented.  Last Vitals:  Vitals:   08/29/19 1215 08/29/19 1236  BP: (!) 119/43 114/77  Pulse: 81 79  Resp: (!) 22 18  Temp:  36.6 C  SpO2: 99% 99%    Last Pain:  Vitals:   08/29/19 1236  TempSrc:   PainSc: 2                  Lynda Rainwater

## 2019-08-29 NOTE — Interval H&P Note (Signed)
History and Physical Interval Note:  08/29/2019 10:10 AM  [name] [name]  has presented today for surgery, with the diagnosis of RIGHT BREAST CANCER.  The various methods of treatment have been discussed with the patient and family. After consideration of risks, benefits and other options for treatment, the patient has consented to  Procedure(s) with comments: RIGHT BREAST LUMPECTOMY WITH RADIOACTIVE SEED AND SENTINEL LYMPH NODE BIOPSY (Right) - PEC BLOCK as a surgical intervention.  The patient's history has been reviewed, patient examined, no change in status, stable for surgery.  I have reviewed the patient's chart and labs.  Questions were answered to the patient's satisfaction.     Turner Daniels MD

## 2019-08-29 NOTE — Progress Notes (Signed)
Assisted Dr. Miller with right, ultrasound guided, pectoralis block. Side rails up, monitors on throughout procedure. See vital signs in flow sheet. Tolerated Procedure well. 

## 2019-08-29 NOTE — Anesthesia Procedure Notes (Signed)
Procedure Name: LMA Insertion Date/Time: 08/29/2019 10:52 AM Performed by: Lavonia Dana, CRNA Pre-anesthesia Checklist: Patient identified, Emergency Drugs available, Suction available and Patient being monitored Patient Re-evaluated:Patient Re-evaluated prior to induction Oxygen Delivery Method: Circle system utilized Preoxygenation: Pre-oxygenation with 100% oxygen Induction Type: IV induction Ventilation: Mask ventilation without difficulty LMA: LMA inserted LMA Size: 4.0 Number of attempts: 1 Airway Equipment and Method: Bite block Placement Confirmation: positive ETCO2 Tube secured with: Tape Dental Injury: Teeth and Oropharynx as per pre-operative assessment

## 2019-08-29 NOTE — Anesthesia Procedure Notes (Signed)
Anesthesia Regional Block: Pectoralis block   Pre-Anesthetic Checklist: ,, timeout performed, Correct Patient, Correct Site, Correct Laterality, Correct Procedure, Correct Position, site marked, Risks and benefits discussed,  Surgical consent,  Pre-op evaluation,  At surgeon's request and post-op pain management  Laterality: Right  Prep: chloraprep       Needles:  Injection technique: Single-shot  Needle Type: Stimiplex     Needle Length: 9cm  Needle Gauge: 21     Additional Needles:   Procedures:,,,, ultrasound used (permanent image in chart),,,,  Narrative:  Start time: 08/29/2019 10:25 AM End time: 08/29/2019 10:30 AM Injection made incrementally with aspirations every 5 mL.  Performed by: Personally  Anesthesiologist: Lynda Rainwater, MD

## 2019-08-30 ENCOUNTER — Encounter (HOSPITAL_BASED_OUTPATIENT_CLINIC_OR_DEPARTMENT_OTHER): Payer: Self-pay | Admitting: Surgery

## 2019-09-03 LAB — SURGICAL PATHOLOGY

## 2019-09-04 ENCOUNTER — Ambulatory Visit: Payer: Medicare Other | Admitting: Nurse Practitioner

## 2019-09-04 ENCOUNTER — Encounter: Payer: Self-pay | Admitting: *Deleted

## 2019-09-04 ENCOUNTER — Telehealth: Payer: Self-pay | Admitting: *Deleted

## 2019-09-04 NOTE — Telephone Encounter (Signed)
Received order for onoctype testing. Requisition faxed to pathology and [facility]. ?

## 2019-09-07 ENCOUNTER — Telehealth: Payer: Self-pay | Admitting: *Deleted

## 2019-09-07 NOTE — Telephone Encounter (Signed)
Spoke with patient to answer questions she had regarding her next steps.  Explained that we are waiting on her oncotype results which normally takes about 2 weeks and then if it is low her next step would be xrt followed by anti-estrogens. She asked if she would need take xrt if she was going to take ant-estrogen.  Explained to her that it was a possibility but that she should discuss with Dr. Sondra Come and we would make that appointment when we know her results from the oncotype.  If then it is decided that she is going to do anti-estrogens we will get her in with Dr. Jana Hakim sooner than the 9/2 appt.   Patient verbalized understanding.

## 2019-09-07 NOTE — Telephone Encounter (Signed)
VM left by pt stating she had lumpectomy on 7/14 and is scheduled for MD follow up on 9/2.  She would like to go ahead and start on medications now versus waiting until 9/2 - as well as she has other questions too.  Noted oncotype sent and pt is under [facility] navigation.  This note will be forwarded to navigators for appropriate follow up.

## 2019-09-18 ENCOUNTER — Encounter: Payer: Self-pay | Admitting: *Deleted

## 2019-09-18 ENCOUNTER — Telehealth: Payer: Self-pay | Admitting: *Deleted

## 2019-09-18 NOTE — Telephone Encounter (Signed)
Left vm informing pt oncotype results should be resulted on 09/21/19. Contact information provided for questions or needs.

## 2019-09-21 ENCOUNTER — Encounter: Payer: Self-pay | Admitting: *Deleted

## 2019-09-21 ENCOUNTER — Telehealth: Payer: Self-pay | Admitting: *Deleted

## 2019-09-21 NOTE — Telephone Encounter (Signed)
Spoke with patient's insurance company to start PA per genomic health request. Records faxed to [phone]

## 2019-09-24 ENCOUNTER — Other Ambulatory Visit: Payer: Self-pay

## 2019-09-24 ENCOUNTER — Ambulatory Visit: Payer: Medicare Other | Attending: Surgery | Admitting: Physical Therapy

## 2019-09-24 ENCOUNTER — Encounter: Payer: Self-pay | Admitting: Physical Therapy

## 2019-09-24 DIAGNOSIS — R293 Abnormal posture: Secondary | ICD-10-CM | POA: Diagnosis not present

## 2019-09-24 DIAGNOSIS — Z170 Estrogen receptor positive status [ER+]: Secondary | ICD-10-CM | POA: Diagnosis not present

## 2019-09-24 DIAGNOSIS — C50211 Malignant neoplasm of upper-inner quadrant of right female breast: Secondary | ICD-10-CM | POA: Insufficient documentation

## 2019-09-24 DIAGNOSIS — Z483 Aftercare following surgery for neoplasm: Secondary | ICD-10-CM | POA: Diagnosis not present

## 2019-09-24 NOTE — Patient Instructions (Signed)
            [facility]         [address], [place] [address]         [phone]         Annia Friendly, PT, CLT   After Breast Cancer Class It is recommended you attend the ABC class to be educated on lymphedema risk reduction. This class is free of charge and lasts for 1 hour. It is a 1-time class. You are scheduled for September 20th for this.   Scar massage You can begin gentle soft tissue work to your incisions using coconut oil.   Home exercise Program Work on your home exercises to regain full shoulder range of motion.   Follow up PT: It is recommended you return every 3 months for the first 3 years following surgery to be assessed on the SOZO machine for an L-Dex score. This helps prevent clinically significant lymphedema in 95% of patients. These follow up screens are 15 minute appointments that you are not billed for.

## 2019-09-24 NOTE — Therapy (Signed)
[place], [place], [address] Phone: [phone]   Fax:  [phone]  Physical Therapy Treatment  Patient Details  Name: [name] [name] MRN: [id number] Date of Birth: 03/24/1945 Referring Provider (PT): Dr. Erroll Luna   Encounter Date: 09/24/2019   PT End of Session - 09/24/19 1537    Visit Number 2    Number of Visits 2    PT Start Time 1500    PT Stop Time 1537    PT Time Calculation (min) 37 min    Activity Tolerance Patient tolerated treatment well    Behavior During Therapy [facility] for tasks assessed/performed           Past Medical History:  Diagnosis Date  . Anxiety    being weaned off lithium will be off on 06/23/11  . Bipolar disorder ([name])   . Cancer [facility]) 07/2019   right breast IDC with DCIS  . Diverticulosis 09/01/2011   mild, left sided  . Family history of breast cancer   . Family history of colon cancer   . Hyperlipidemia   . Hypothyroidism   . Leg cramps   . Numbness and tingling in left arm   . Osteopenia   . Stage 3 chronic kidney disease    CKD stg 3    Past Surgical History:  Procedure Laterality Date  . BREAST LUMPECTOMY WITH RADIOACTIVE SEED AND SENTINEL LYMPH NODE BIOPSY Right 08/29/2019   Procedure: RIGHT BREAST LUMPECTOMY WITH RADIOACTIVE SEED AND SENTINEL LYMPH NODE BIOPSY;  Surgeon: Erroll Luna, MD;  Location: [place];  Service: General;  Laterality: Right;  . COLONOSCOPY  06/09/06   Dr.Orr  . COLONOSCOPY  09/01/2011  . DILATATION & CURETTAGE/HYSTEROSCOPY WITH TRUECLEAR N/A 11/12/2013   Procedure: RESECTOSCOPIC POLYPECTOMY Irish Lack, D&C;  Surgeon: Terrance Mass, MD;  Location: [facility];  Service: Gynecology;  Laterality: N/A;  . ENDOMETRIAL BIOPSY  09/14/2013   Dr. Uvaldo Rising    There were no vitals filed for this visit.   Subjective Assessment - 09/24/19 1503    Subjective Patient reports she underwent a right lumpectomy and  sentinel node biopsy (4 negative nodes) on 08/29/2019. She is awaiting Oncotype results and would like to avoid radiation.    Pertinent History Patient was diagnosed on 07/12/2019 with right grade II invasive ductal carcinoma breast cancer. Patient reports she underwent a right lumpectomy and sentinel node biopsy (4 negative nodes) on 08/29/2019. It is ER/PR positive and HER2 negative with a Ki67 of 2%.    Patient Stated Goals See how my arm is doing    Currently in Pain? No/denies              [facility] PT Assessment - 09/24/19 0001      Assessment   Medical Diagnosis s/p right lumpectomy and SLNB    Referring Provider (PT) Dr. [name] [place]    Onset Date/Surgical Date 08/29/19    Hand Dominance Right    Prior Therapy baselines      Precautions   Precautions Other (comment)    Precaution Comments recent surgery      Restrictions   Weight Bearing Restrictions No      Balance Screen   Has the patient fallen in the past 6 months No    Has the patient had a decrease in activity level because of a fear of falling?  No    Is the patient reluctant to leave their home because of a fear of  falling?  No      Home Ecologist residence    Living Arrangements Spouse/significant other   Husband has Alzheimer's   Available Help at Discharge Family      Prior Function   Level of [place] Retired    Leisure She does not exercise      Cognition   Overall Cognitive Status Within Functional Limits for tasks assessed      Observation/Other Assessments   Observations Right breast and axillary incisions both appear to be healing well. No edema or redness present.      Posture/Postural Control   Posture/Postural Control Postural limitations    Postural Limitations Rounded Shoulders;Forward head      ROM / Strength   AROM / PROM / Strength AROM      AROM   AROM Assessment Site Shoulder    Right/Left Shoulder Right    Right Shoulder  Extension 55 Degrees    Right Shoulder Flexion 128 Degrees    Right Shoulder ABduction 141 Degrees    Right Shoulder Internal Rotation 62 Degrees    Right Shoulder External Rotation 85 Degrees      Strength   Overall Strength Within functional limits for tasks performed             LYMPHEDEMA/ONCOLOGY QUESTIONNAIRE - 09/24/19 0001      Type   Cancer Type Right breast cancer      Surgeries   Lumpectomy Date 08/29/19    Sentinel Lymph Node Biopsy Date 08/29/19    Number Lymph Nodes Removed 4      Treatment   Active Chemotherapy Treatment No    Past Chemotherapy Treatment No    Active Radiation Treatment No    Past Radiation Treatment No    Current Hormone Treatment No    Past Hormone Therapy No      What other symptoms do you have   Are you Having Heaviness or Tightness No    Are you having Pain No    Are you having pitting edema No    Is it Hard or Difficult finding clothes that fit No    Do you have infections No    Is there Decreased scar mobility No    [place]mer Sign No      Lymphedema Assessments   Lymphedema Assessments Upper extremities      Right Upper Extremity Lymphedema   10 cm Proximal to Olecranon Process 31.2 cm    Olecranon Process 27.5 cm    10 cm Proximal to Ulnar Styloid Process 23.8 cm    Just Proximal to Ulnar Styloid Process 16.2 cm    Across Hand at [employer] 19.5 cm    At [place] of 2nd Digit 7.2 cm      Left Upper Extremity Lymphedema   10 cm Proximal to Olecranon Process 30.5 cm    Olecranon Process 27.1 cm    10 cm Proximal to Ulnar Styloid Process 23.1 cm    Just Proximal to Ulnar Styloid Process 16 cm    Across Hand at [employer] 19.3 cm    At [place] of 2nd Digit 7 cm              Quick Dash - 09/24/19 0001    Open a tight or new jar No difficulty    Do heavy household chores (wash walls, wash floors) No difficulty    Carry a shopping bag or briefcase  No difficulty    Wash your back No difficulty    Use a knife to  cut food No difficulty    Recreational activities in which you take some force or impact through your arm, shoulder, or hand (golf, hammering, tennis) No difficulty    During the past week, to what extent has your arm, shoulder or hand problem interfered with your normal social activities with family, friends, neighbors, or groups? Not at all    During the past week, to what extent has your arm, shoulder or hand problem limited your work or other regular daily activities Not at all    Arm, shoulder, or hand pain. None    Tingling (pins and needles) in your arm, shoulder, or hand None    Difficulty Sleeping No difficulty    DASH Score 0 %                          PT Education - 09/24/19 1537    Education Details Lymphedema risk reduction and f/u instructions    Person(s) Educated Patient    Methods Explanation;Handout    Comprehension Verbalized understanding               PT Long Term Goals - 09/24/19 1613      PT LONG TERM GOAL #1   Title Patient will demonstrate she has regained full shoulder ROM and function post operatively compared to baselines.    Time 8    Period Weeks    Status Achieved                 Plan - 09/24/19 1538    Clinical Impression Statement Patient is doing very well s.p right lumpectomy and sentinel node biopsy with 4 negative nodes. She has regained shoulder ROM and function except lacking some mild flexion and abducttion range of motion. Otherwise she has returned to baseline. She will benefit from the After Breast Cancer class for lymphedema risk reduction but otherwise has no PT needs at this time.    PT Treatment/Interventions ADLs/Self Care Home Management;Patient/family education;Therapeutic exercise    PT Next Visit Plan D/C; f/u every 3 months for L-Dex screenings    PT Home Exercise Plan Post op shoulder ROM HEP    Consulted and Agree with Plan of Care Patient           Patient will benefit from skilled therapeutic  intervention in order to improve the following deficits and impairments:  Postural dysfunction, Decreased range of motion, Impaired UE functional use, Pain, Decreased knowledge of precautions  Visit Diagnosis: Malignant neoplasm of upper-inner quadrant of right breast in female, estrogen receptor positive ([place])  Abnormal posture  Aftercare following surgery for neoplasm     Problem List Patient Active Problem List   Diagnosis Date Noted  . Genetic testing 08/10/2019  . Family history of breast cancer   . Family history of colon cancer   . Malignant neoplasm of upper-inner quadrant of right breast in female, estrogen receptor positive ([place]) 07/30/2019  . Osteopenia 09/10/2016  . Vaginal atrophy 09/10/2015  . Menopause 09/10/2015   PHYSICAL THERAPY DISCHARGE SUMMARY  Visits from Start of Care: 2  Current functional level related to goals / functional outcomes: Goals met. See above for objective findings.   Remaining deficits: None   Education / Equipment: Lymphedema risk reduction and post op shoulder ROM HEP Plan: Patient agrees to discharge.  Patient goals were met. Patient is being discharged  due to meeting the stated rehab goals.  ?????         Annia Friendly, [place] 09/24/19 4:58 PM   [place], [place], [address] Phone: [phone]   Fax:  [phone]  Name: [name] [name] MRN: [id number] Date of Birth: [birth year]

## 2019-09-25 ENCOUNTER — Telehealth: Payer: Self-pay | Admitting: *Deleted

## 2019-09-26 ENCOUNTER — Telehealth: Payer: Self-pay | Admitting: *Deleted

## 2019-09-26 NOTE — Telephone Encounter (Signed)
Left vm for pt regarding oncotype testing. Informed testing has not been resulted d/t delay in insurance providing authorization for testing. Informed we will call with oncotype results once they have been released. Contact information provided for questions.

## 2019-09-27 ENCOUNTER — Telehealth: Payer: Self-pay | Admitting: *Deleted

## 2019-09-27 DIAGNOSIS — Z170 Estrogen receptor positive status [ER+]: Secondary | ICD-10-CM

## 2019-09-27 DIAGNOSIS — C50211 Malignant neoplasm of upper-inner quadrant of right female breast: Secondary | ICD-10-CM

## 2019-09-27 NOTE — Telephone Encounter (Signed)
Received oncotype score of 13/4%. Physician team notified. Called pt with results and discussed chemo not recommended. Discussed referral will be placed for pt to discuss xrt with Dr.Kinard. Received verbal understanding. Confirmed appt with Dr. Jana Hakim.

## 2019-09-28 ENCOUNTER — Telehealth: Payer: Self-pay | Admitting: *Deleted

## 2019-09-28 ENCOUNTER — Encounter (HOSPITAL_COMMUNITY): Payer: Self-pay | Admitting: Oncology

## 2019-09-28 ENCOUNTER — Encounter: Payer: Self-pay | Admitting: *Deleted

## 2019-09-28 NOTE — Telephone Encounter (Signed)
Pt called regarding appt with Dr. Sondra Come. Discussed and confirmed appts with both Drs. Magrinat and Kinard, Discussed final pathology and oncotype score. Denies further questions or needs at this time.

## 2019-10-01 DIAGNOSIS — N183 Chronic kidney disease, stage 3 unspecified: Secondary | ICD-10-CM | POA: Diagnosis not present

## 2019-10-01 DIAGNOSIS — R7309 Other abnormal glucose: Secondary | ICD-10-CM | POA: Diagnosis not present

## 2019-10-01 DIAGNOSIS — F39 Unspecified mood [affective] disorder: Secondary | ICD-10-CM | POA: Diagnosis not present

## 2019-10-01 DIAGNOSIS — Z5181 Encounter for therapeutic drug level monitoring: Secondary | ICD-10-CM | POA: Diagnosis not present

## 2019-10-01 DIAGNOSIS — E039 Hypothyroidism, unspecified: Secondary | ICD-10-CM | POA: Diagnosis not present

## 2019-10-04 ENCOUNTER — Telehealth: Payer: Self-pay | Admitting: *Deleted

## 2019-10-04 NOTE — Telephone Encounter (Signed)
Called pt to r/s appt with Dr. Jana Hakim to 8/30 at 12:30. Confirmed appt with pt. R/s form 9/2 to 8/30

## 2019-10-14 NOTE — Progress Notes (Signed)
[name]  [phone] Fax:([phone]) [phone]     ID: [name] [name] DOB: 31-Aug-1945  MR#: [id number]  LZJ#:[id number]  Patient Care Team: Janie Morning, DO as PCP - General (Family Medicine) Erroll Luna, MD as Consulting Physician (General Surgery) Caroll Cunnington, Virgie Dad, MD as Consulting Physician (Oncology) Gery Pray, MD as Consulting Physician (Radiation Oncology) Mauro Kaufmann, RN as Oncology Nurse Navigator Rockwell Germany, RN as Oncology Nurse Navigator Mikey Bussing, DDS as Consulting Physician (Dentistry) Lorelle Gibbs, MD (Radiology) Chauncey Cruel, MD OTHER MD:  CHIEF COMPLAINT: Estrogen receptor positive breast cancer  CURRENT TREATMENT: Adjuvant radiation pending   INTERVAL HISTORY: [name] returns today for follow up of her estrogen receptor positive breast cancer. She was evaluated in the multidisciplinary breast cancer clinic on 08/01/2019.  She underwent genetic counseling during clinic. Results were negative.   She underwent right lumpectomy and sentinel lymph node biopsy on 08/29/2019 under Dr. Brantley Stage. Pathology from the procedure [id number]) showed: invasive ductal carcinoma, grade 2, 1.5 cm; ductal carcinoma in situ, intermediate grade; margins uninvolved.  All four biopsied lymph nodes were negative for carcinoma (0/4).  Oncotype DX was obtained on the final surgical sample and the recurrence score of 13 predicts a risk of recurrence outside the breast over the next 9 years of 4%, if the patient's only systemic therapy is an antiestrogen for 5 years.  It also predicts no benefit from chemotherapy.   REVIEW OF SYSTEMS: Khaila did well with her surgery.  She tells me her right breast was always a little smaller than the left and now it is even smaller.  She wonders if they could make the left breast smaller which of course they can but she does not wish to go through any further surgery.  She is very concerned both  about herself and her husband both of whom are relatively immunocompromise due to their chronic conditions.  Their 54 year old granddaughter is now going live to school and they wonder if she needs to be kept back home.  Zenovia did receive both Covid shots through [employer] and is planning to receive the booster as soon as she is done with radiation.  A detailed review of systems today was stable except as noted  HISTORY OF CURRENT ILLNESS: From the original intake note:  [name] [name] herself palpated a right breast lump for approximately 1 month. Of note, screening mammogram on 12/11/2018 was benign. She underwent right diagnostic mammography with tomography and right breast ultrasonography at [facility] on 07/12/2019 showing: breast density category B; 1.6 cm irregular mass in right breast at 2 o'clock.  Accordingly on 07/25/2019 she proceeded to biopsy of the right breast area in question. The pathology from this procedure ([id number]) showed: invasive and in situ mammary carcinoma, grade 2, e-cadherin positive. Prognostic indicators significant for: estrogen receptor, 100% positive with strong staining intensity and progesterone receptor, 70% positive with moderate staining intensity. Proliferation marker Ki67 at 2%. HER2 equivoal by immunohistochemistry (2+), but negative by fluorescent in situ hybridization with a signals ratio 1.53 and number per cell 2.75.  The patient's subsequent history is as detailed below.   PAST MEDICAL HISTORY: Past Medical History:  Diagnosis Date  . Anxiety    being weaned off lithium will be off on 06/23/11  . Bipolar disorder ([place])   . Cancer [facility]) 07/2019   right breast IDC with DCIS  . Diverticulosis 09/01/2011   mild, left sided  . Family history of breast cancer   . Family history of  colon cancer   . Hyperlipidemia   . Hypothyroidism   . Leg cramps   . Numbness and tingling in left arm   . Osteopenia   . Stage 3 chronic kidney disease    CKD stg 3    PAST  SURGICAL HISTORY: Past Surgical History:  Procedure Laterality Date  . BREAST LUMPECTOMY WITH RADIOACTIVE SEED AND SENTINEL LYMPH NODE BIOPSY Right 08/29/2019   Procedure: RIGHT BREAST LUMPECTOMY WITH RADIOACTIVE SEED AND SENTINEL LYMPH NODE BIOPSY;  Surgeon: Erroll Luna, MD;  Location: [place];  Service: General;  Laterality: Right;  . COLONOSCOPY  06/09/06   Dr.Orr  . COLONOSCOPY  09/01/2011  . DILATATION & CURETTAGE/HYSTEROSCOPY WITH TRUECLEAR N/A 11/12/2013   Procedure: RESECTOSCOPIC POLYPECTOMY Irish Lack, D&C;  Surgeon: Terrance Mass, MD;  Location: [facility];  Service: Gynecology;  Laterality: N/A;  . ENDOMETRIAL BIOPSY  09/14/2013   Dr. Uvaldo Rising    FAMILY HISTORY: Family History  Problem Relation Age of Onset  . Diabetes Mother   . Other Mother        meningioma (multiple)  . Colon cancer Father 80  . Parkinson's disease Sister 29  . Alzheimer's disease Maternal Grandfather   . Breast cancer Cousin 54       maternal cousin  . Other Daughter        tiny meningioma outside of the brain  . Ovarian cancer Neg Hx   . Colon polyps Neg Hx   The patient is a vascular as the extraction.  Her parents both died at age 30, her father from colon cancer diagnosed a year before his death.  Her mother died from complications of diabetes.  The patient has one sister, no brothers.   GYNECOLOGIC HISTORY:  No LMP recorded. Patient is postmenopausal. Menarche: 74 years old Age at first live birth: 73 years old [place] P 2 LMP unsure Contraceptive: previously used HRT: previously used for a "few years"  Hysterectomy? no BSO? 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 Husband [name] is a former Education officer, environmental ([place]) and later worked for [employer].. Daughter [name], age 105, works as a Government social research officer for [employer]) in [place], [place]. Son Bernadetta Roell, age 57, works for a Production assistant, radio here in [place].  The patient has 2 grandchildren.  She attends [employer]    ADVANCED DIRECTIVES: In place.   HEALTH MAINTENANCE: Social History   Tobacco Use  . Smoking status: Never Smoker  . Smokeless tobacco: Never Used  Vaping Use  . Vaping Use: Never used  Substance Use Topics  . Alcohol use: No    Alcohol/week: 0.0 standard drinks  . Drug use: No     Colonoscopy: 08/2011  PAP: 2019  Bone density: 01/2019, osteopenia   No Known Allergies  Current Outpatient Medications  Medication Sig Dispense Refill  . b complex vitamins tablet Take 1 tablet by mouth daily.    . Biotin 5000 MCG TABS Take 1 tablet by mouth daily.    . carbamazepine (TEGRETOL XR) 200 MG 12 hr tablet Take 400 mg by mouth at bedtime.     . Cholecalciferol (VITAMIN D-3) 1000 UNITS CAPS Take 1 capsule by mouth daily.    . Coenzyme Q10 (CO Q 10 PO) Take 300 mg by mouth daily.    . Cyanocobalamin (B-12) 5000 MCG  SUBL Place 1 tablet under the tongue every morning.    . furosemide (LASIX) 40 MG tablet Take 40 mg by mouth.    [name] HYDROcodone-acetaminophen (NORCO/VICODIN) 5-325 MG tablet Take 1 tablet by mouth every 6 (six) hours as needed for moderate pain. 15 tablet 0  . Magnesium 400 MG TABS Take 1 tablet by mouth daily.    . Multiple Vitamins-Minerals (MULTIVITAMIN WITH MINERALS) tablet Take 1 tablet by mouth daily.    . Omega-3 Fatty Acids (FISH OIL) 1200 MG CAPS Take 3 capsules by mouth 2 (two) times daily.     . potassium chloride (KLOR-CON) 10 MEQ tablet Take 10 mEq by mouth 2 (two) times daily.    . Probiotic Product (PROBIOTIC DAILY PO) Take 2 capsules by mouth daily.    . simvastatin (ZOCOR) 20 MG tablet Take 20 mg by mouth daily.    [name] SYNTHROID 88 MCG tablet Take 1 tablet by mouth Daily.     No current facility-administered medications for this visit.    OBJECTIVE: White woman who appears stated  age  75:   10/15/19 1223  BP: (!) 145/65  Pulse: 98  Resp: 18  Temp: 98.3 F (36.8 C)  SpO2: 98%     Body mass index is 36.28 kg/m.   Wt Readings from Last 3 Encounters:  10/15/19 192 lb (87.1 kg)  08/29/19 186 lb 15.2 oz (84.8 kg)  08/01/19 189 lb 6.4 oz (85.9 kg)      ECOG FS:1 - Symptomatic but completely ambulatory  Sclerae unicteric, EOMs intact Wearing a mask No cervical or supraclavicular adenopathy Lungs no rales or rhonchi Heart regular rate and rhythm Abd soft, nontender, positive bowel sounds MSK no focal spinal tenderness, no upper extremity lymphedema Neuro: nonfocal, well oriented, appropriate affect Breasts: The right breast is status post recent lumpectomy.  The cosmetic result is good.  It is approximately 20% smaller than the left.  There is no evidence of residual or recurrent disease.  Both axillae are benign.   LAB RESULTS:  CMP     Component Value Date/Time   NA 142 08/24/2019 1130   K 3.7 08/24/2019 1130   CL 106 08/24/2019 1130   CO2 25 08/24/2019 1130   GLUCOSE 115 (H) 08/24/2019 1130   BUN 20 08/24/2019 1130   CREATININE 1.38 (H) 08/24/2019 1130   CREATININE 1.36 (H) 08/01/2019 1215   CALCIUM 9.0 08/24/2019 1130   PROT 7.1 08/24/2019 1130   ALBUMIN 3.6 08/24/2019 1130   AST 16 08/24/2019 1130   AST 17 08/01/2019 1215   ALT 17 08/24/2019 1130   ALT 17 08/01/2019 1215   ALKPHOS 82 08/24/2019 1130   BILITOT 0.9 08/24/2019 1130   BILITOT 0.3 08/01/2019 1215   GFRNONAA 38 (L) 08/24/2019 1130   GFRNONAA 38 (L) 08/01/2019 1215   GFRAA 44 (L) 08/24/2019 1130   GFRAA 44 (L) 08/01/2019 1215    No results found for: TOTALPROTELP, ALBUMINELP, A1GS, A2GS, BETS, BETA2SER, GAMS, MSPIKE, SPEI  Lab Results  Component Value Date   WBC 8.2 08/24/2019   NEUTROABS 4.8 08/24/2019   HGB 12.3 08/24/2019   HCT 37.9 08/24/2019   MCV 95.7 08/24/2019   PLT 216 08/24/2019    No results found for: LABCA2  No components found for:  [id number]  No results for input(s): INR in the last 168 hours.  No results found for: LABCA2  No results found for: HFW263  No results found for: ZCH885  No results found for: OYD741  No results found for: CA2729  No components found for: HGQUANT  No results found for: CEA1 / No results found for: CEA1   No results found for: AFPTUMOR  No results found for: CHROMOGRNA  No results found for: KPAFRELGTCHN, LAMBDASER, KAPLAMBRATIO (kappa/lambda light chains)  No results found for: HGBA, HGBA2QUANT, HGBFQUANT, HGBSQUAN (Hemoglobinopathy evaluation)   No results found for: LDH  No results found for: IRON, TIBC, IRONPCTSAT (Iron and TIBC)  No results found for: FERRITIN  Urinalysis    Component Value Date/Time   COLORURINE YELLOW 11/09/2013 [place] 11/09/2013 1143   LABSPEC <1.005 (L) 11/09/2013 1143   PHURINE 7.0 11/09/2013 1143   GLUCOSEU NEGATIVE 11/09/2013 1143   HGBUR NEGATIVE 11/09/2013 1143   BILIRUBINUR NEGATIVE 11/09/2013 1143   KETONESUR NEGATIVE 11/09/2013 1143   PROTEINUR NEGATIVE 11/09/2013 1143   UROBILINOGEN 0.2 11/09/2013 1143   NITRITE NEGATIVE 11/09/2013 1143   LEUKOCYTESUR NEGATIVE 11/09/2013 1143    STUDIES: No results found.   ELIGIBLE FOR AVAILABLE RESEARCH PROTOCOL: AET  ASSESSMENT: 74 y.o. [place] woman status post right breast upper inner quadrant biopsy 07/25/2019 for a clinical T1c N0, stage IA invasive ductal carcinoma, grade 2, estrogen and progesterone receptor positive, HER-2 not amplified, with an MIB-1 of 2%.  (1) status post right lumpectomy and sentinel lymph node sampling 08/29/2019 for a pT1c pN0, stage IA invasive ductal carcinoma, grade 2, with negative margins.  (a) a total of 4 right axillary lymph nodes were removed  (2) genetics testing 08/07/2019 through the  [facility] Common Hereditary Cancers Panel found no deleterious mutations in APC, ATM, AXIN2, BARD1, BMPR1A, BRCA1, BRCA2, BRIP1, CDH1, CDK4,  CDKN2A (p14ARF), CDKN2A (p16INK4a), CHEK2, CTNNA1, DICER1, EPCAM (Deletion/duplication testing only), GREM1 (promoter region deletion/duplication testing only), KIT, MEN1, MLH1, MSH2, MSH3, MSH6, MUTYH, NBN, NF1, NHTL1, PALB2, PDGFRA, PMS2, POLD1, POLE, PTEN, RAD50, RAD51C, RAD51D, RNF43, SDHB, SDHC, SDHD, SMAD4, SMARCA4. STK11, TP53, TSC1, TSC2, and VHL.  The following genes were evaluated for sequence changes only: SDHA and HOXB13 c.251G>A variant only.   (3) Oncotype score of 13 predicts a risk of recurrence outside the breast in the next 9 years of 4% if the patient's only systemic therapy is antiestrogens.  It also predicts no significant benefit from chemotherapy  (4) adjuvant radiation pending  (5) to start anastrozole at the completion of local treatment   PLAN: Elisabetta remains very anxious and had many questions relating to basics such as what does ductal mean and what does invasive mean.  We reviewed all that in detail.  She understands her tumor was very small, it was not very aggressive, it was slow-growing, and that she falls in group #13 in the Oncotype series and women in that group have a 96% chance of being essentially cured (no recurrence outside the breast within 9 years).  She had many questions about antiestrogens, wide margins mean, and other issues which we went over in detail.  She thought she did not want radiation.  She is anxious about that.  I strongly suggested she go through with the radiation which will greatly reduce her risk of local recurrence.  We left it then that she will proceed with radiation and then she will meet with me the first week in December at which time we will start anastrozole.  Note that she recently had a bone density showing osteopenia.  We will discuss bisphosphonates also at that time  She knows to call for any other issue that may develop before the  next visit  Total encounter time 40 minutes.Sarajane Jews C. Tejay Hubert, MD 10/15/2019 12:34  PM Medical Oncology and Hematology [facility] [place], [place] [address] Tel. [phone]    Fax. [phone]   This document serves as a record of services personally performed by Lurline Del, MD. It was created on his behalf by Wilburn Mylar, a trained medical scribe. The creation of this record is based on the scribe's personal observations and the provider's statements to them.   I, Lurline Del MD, have reviewed the above documentation for accuracy and completeness, and I agree with the above.   *Total Encounter Time as defined by the Centers for Medicare and Medicaid Services includes, in addition to the face-to-face time of a patient visit (documented in the note above) non-face-to-face time: obtaining and reviewing outside history, ordering and reviewing medications, tests or procedures, care coordination (communications with other health care professionals or caregivers) and documentation in the medical record.

## 2019-10-15 ENCOUNTER — Ambulatory Visit: Payer: Medicare Other | Admitting: Radiation Oncology

## 2019-10-15 ENCOUNTER — Other Ambulatory Visit: Payer: Self-pay

## 2019-10-15 ENCOUNTER — Ambulatory Visit: Payer: Medicare Other

## 2019-10-15 ENCOUNTER — Inpatient Hospital Stay: Payer: Medicare Other | Attending: Oncology | Admitting: Oncology

## 2019-10-15 VITALS — BP 145/65 | HR 98 | Temp 98.3°F | Resp 18 | Ht 61.0 in | Wt 192.0 lb

## 2019-10-15 DIAGNOSIS — M8580 Other specified disorders of bone density and structure, unspecified site: Secondary | ICD-10-CM | POA: Insufficient documentation

## 2019-10-15 DIAGNOSIS — Z803 Family history of malignant neoplasm of breast: Secondary | ICD-10-CM | POA: Insufficient documentation

## 2019-10-15 DIAGNOSIS — C50211 Malignant neoplasm of upper-inner quadrant of right female breast: Secondary | ICD-10-CM | POA: Insufficient documentation

## 2019-10-15 DIAGNOSIS — Z170 Estrogen receptor positive status [ER+]: Secondary | ICD-10-CM

## 2019-10-15 DIAGNOSIS — Z800 Family history of malignant neoplasm of digestive organs: Secondary | ICD-10-CM | POA: Diagnosis not present

## 2019-10-15 DIAGNOSIS — Z79811 Long term (current) use of aromatase inhibitors: Secondary | ICD-10-CM | POA: Insufficient documentation

## 2019-10-16 ENCOUNTER — Telehealth: Payer: Self-pay | Admitting: Oncology

## 2019-10-16 NOTE — Telephone Encounter (Signed)
No entry 

## 2019-10-16 NOTE — Telephone Encounter (Signed)
Scheduled appt per 8/30 los. Pt confirmed appt date and time.

## 2019-10-18 ENCOUNTER — Ambulatory Visit: Payer: Medicare Other | Admitting: Oncology

## 2019-10-18 ENCOUNTER — Other Ambulatory Visit: Payer: Medicare Other

## 2019-10-19 NOTE — Progress Notes (Signed)
Patient here for a  Consult with Dr. Sondra Come. ID: [name] [name] DOB: 1945/12/31  MR#: [id number]  CSN#:[id number]   Patient Care Team: Janie Morning, DO as PCP - General (Family Medicine) Erroll Luna, MD as Consulting Physician (General Surgery) Magrinat, Virgie Dad, MD as Consulting Physician (Oncology) Gery Pray, MD as Consulting Physician (Radiation Oncology) Mauro Kaufmann, RN as Oncology Nurse Navigator Rockwell Germany, RN as Oncology Nurse Navigator Mikey Bussing, DDS as Consulting Physician (Dentistry) Lorelle Gibbs, MD (Radiology) Chauncey Cruel, MD OTHER MD:   CHIEF COMPLAINT: Estrogen receptor positive breast cancer   CURRENT TREATMENT: Adjuvant radiation pending     INTERVAL HISTORY: [name] [name] returns today for follow up of her estrogen receptor positive breast cancer. She was evaluated in the multidisciplinary breast cancer clinic on 08/01/2019.   She underwent genetic counseling during clinic. Results were negative.    She underwent right lumpectomy and sentinel lymph node biopsy on 08/29/2019 under Dr. Brantley Stage. Pathology from the procedure [id number]) showed: invasive ductal carcinoma, grade 2, 1.5 cm; ductal carcinoma in situ, intermediate grade; margins uninvolved.   All four biopsied lymph nodes were negative for carcinoma (0/4).   Oncotype DX was obtained on the final surgical sample and the recurrence score of 13 predicts a risk of recurrence outside the breast over the next 9 years of 4%, if the patient's only systemic therapy is an antiestrogen for 5 years.  It also predicts no benefit from chemotherapy.     REVIEW OF SYSTEMS: [name] [name] did well with her surgery.  She tells me her right breast was always a little smaller than the left and now it is even smaller.  She wonders if they could make the left breast smaller which of course they can but she does not wish to go through any further surgery.  She is very concerned both about herself and her  husband both of whom are relatively immunocompromise due to their chronic conditions.  Their 15 year old granddaughter is now going live to school and they wonder if she needs to be kept back home.  [name] [name] did receive both Covid shots through [employer] and is planning to receive the booster as soon as she is done with radiation.  A detailed review of systems today was stable except as noted   HISTORY OF CURRENT ILLNESS: From the original intake note:   [name] [name] herself palpated a right breast lump for approximately 1 month. Of note, screening mammogram on 12/11/2018 was benign. She underwent right diagnostic mammography with tomography and right breast ultrasonography at [facility] on 07/12/2019 showing: breast density category B; 1.6 cm irregular mass in right breast at 2 o'clock.   Accordingly on 07/25/2019 she proceeded to biopsy of the right breast area in question. The pathology from this procedure ([id number]) showed: invasive and in situ mammary carcinoma, grade 2, e-cadherin positive. Prognostic indicators significant for: estrogen receptor, 100% positive with strong staining intensity and progesterone receptor, 70% positive with moderate staining intensity. Proliferation marker Ki67 at 2%. HER2 equivoal by immunohistochemistry (2+), but negative by fluorescent in situ hybridization with a signals ratio 1.53 and number per cell 2.75.   The patient's subsequent history is as detailed below.     PAST MEDICAL HISTORY:     Past Medical History:  Diagnosis Date   Anxiety      being weaned off lithium will be off on 06/23/11   Bipolar disorder ([place])     Cancer ([place]) 07/2019    right breast IDC with DCIS  Diverticulosis 09/01/2011    mild, left sided   Family history of breast cancer     Family history of colon cancer     Hyperlipidemia     Hypothyroidism     Leg cramps     Numbness and tingling in left arm     Osteopenia     Stage 3 chronic kidney disease      CKD stg 3      PAST  SURGICAL HISTORY:      Past Surgical History:  Procedure Laterality Date   BREAST LUMPECTOMY WITH RADIOACTIVE SEED AND SENTINEL LYMPH NODE BIOPSY Right 08/29/2019    Procedure: RIGHT BREAST LUMPECTOMY WITH RADIOACTIVE SEED AND SENTINEL LYMPH NODE BIOPSY;  Surgeon: Erroll Luna, MD;  Location: [place];  Service: General;  Laterality: Right;   COLONOSCOPY   06/09/06    Dr.Orr   COLONOSCOPY   09/01/2011   DILATATION & CURETTAGE/HYSTEROSCOPY WITH TRUECLEAR N/A 11/12/2013    Procedure: RESECTOSCOPIC POLYPECTOMY Irish Lack, D&C;  Surgeon: Terrance Mass, MD;  Location: [facility];  Service: Gynecology;  Laterality: N/A;   ENDOMETRIAL BIOPSY   09/14/2013    Dr. Uvaldo Rising        ELIGIBLE FOR AVAILABLE RESEARCH PROTOCOL: AET   ASSESSMENT: 74 y.o. [name] [name] woman status post right breast upper inner quadrant biopsy 07/25/2019 for a clinical T1c N0, stage IA invasive ductal carcinoma, grade 2, estrogen and progesterone receptor positive, HER-2 not amplified, with an MIB-1 of 2%.   (1) status post right lumpectomy and sentinel lymph node sampling 08/29/2019 for a pT1c pN0, stage IA invasive ductal carcinoma, grade 2, with negative margins.             (a) a total of 4 right axillary lymph nodes were removed   (2) genetics testing 08/07/2019 through the  [employer] Common Hereditary Cancers Panel found no deleterious mutations in APC, ATM, AXIN2, BARD1, BMPR1A, BRCA1, BRCA2, BRIP1, CDH1, CDK4, CDKN2A (p14ARF), CDKN2A (p16INK4a), CHEK2, CTNNA1, DICER1, EPCAM (Deletion/duplication testing only), GREM1 (promoter region deletion/duplication testing only), KIT, MEN1, MLH1, MSH2, MSH3, MSH6, MUTYH, NBN, NF1, NHTL1, PALB2, PDGFRA, PMS2, POLD1, POLE, PTEN, RAD50, RAD51C, RAD51D, RNF43, SDHB, SDHC, SDHD, SMAD4, SMARCA4. STK11, TP53, TSC1, TSC2, and VHL.  The following genes were evaluated for sequence changes only: SDHA and HOXB13 c.251G>A variant only.    (3) Oncotype score of 13 predicts a risk  of recurrence outside the breast in the next 9 years of 4% if the patient's only systemic therapy is antiestrogens.  It also predicts no significant benefit from chemotherapy   (4) adjuvant radiation pending   (5) to start anastrozole at the completion of local treatment     PLAN: [name] [name] remains very anxious and had many questions relating to basics such as what does ductal mean and what does invasive mean.  We reviewed all that in detail.  She understands her tumor was very small, it was not very aggressive, it was slow-growing, and that she falls in group #13 in the Oncotype series and women in that group have a 96% chance of being essentially cured (no recurrence outside the breast within 9 years).   She had many questions about antiestrogens, wide margins mean, and other issues which we went over in detail.   She thought she did not want radiation.  She is anxious about that.  I strongly suggested she go through with the radiation which will greatly reduce her risk of local recurrence.   We left it then  that she will proceed with radiation and then she will meet with me the first week in December at which time we will start anastrozole.  Note that she recently had a bone density showing osteopenia.  We will discuss bisphosphonates also at that time   She knows to call for any other issue that may develop before the next visit   Total encounter time 40 minutes.[name] [name] C. Magrinat, MD 10/15/2019 12:34 PM Medical Oncology and Hematology   Past/Anticipated interventions by medical oncology, if any: Chemotherapy no  Lymphedema issues, if any: no  Pain issues, if any:  no  SAFETY ISSUES: Prior radiation? no Pacemaker/ICD? no Possible current pregnancy? Postmenopausal Is the patient on methotrexate? no  Current Complaints / other details:  concerned about delay in radiation start  BP (!) 130/53 (BP Location: Left Arm, Patient Position: Sitting, Cuff Size: Large)   Pulse 94    Temp 98.4 F (36.9 C)   Resp 20   Ht 5' 1"  (1.549 m)   Wt 192 lb 6.4 oz (87.3 kg)   SpO2 100%   BMI 36.35 kg/m    Wt Readings from Last 3 Encounters:  10/24/19 192 lb 6.4 oz (87.3 kg)  10/15/19 192 lb (87.1 kg)  08/29/19 186 lb 15.2 oz (84.8 kg)       De Burrs, RN 10/19/2019,2:00 PM

## 2019-10-23 NOTE — Progress Notes (Signed)
Radiation Oncology         [phone]) [phone] ________________________________  Name: [name] [name] MRN: [id number]  Date: 10/24/2019  DOB: [birth year]  Re-Evaluation Note  CC: Janie Morning, DO  Magrinat, Virgie Dad, MD    ICD-10-CM   1. Malignant neoplasm of upper-inner quadrant of right breast in female, estrogen receptor positive ([place])  C50.211    Z17.0     Diagnosis: Stage IA (pT1c, pN0) Right Breast UIQ, Invasive Ductal Carcinoma with intermediate grade DCIS, ER+ / PR+ / Her2-, Grade 2  Narrative:  The patient returns today to discuss radiation treatment options. She was seen in the multidisciplinary breast clinic on 08/01/2019. At that time, it was recommended that she proceed with right breast lumpectomy with sentinel lymph node biopsy, oncotype Dx testing, possible adjuvant radiation therapy depending on final pathologic results, and aromatase inhibitor.  Since consultation, she underwent genetic testing on 08/01/2019. Results were negative and no pathogenic variants were detected.  The patient underwent a right breast lumpectomy with right axillary sentinel lymph node mapping on 08/29/2019 under the care of Dr. Brantley Stage. Pathology from the procedure revealed grade 2 invasive ductal carcinoma with intermediate grade DCIS. Margins were uninvolved by carcinoma. Additional right anterior margin was excised and did not show any residual carcinoma. Four right axillary sentinel lymph nodes were biopsied and all were negative for carcinoma.   Oncotype DX was obtained on the final surgical sample and the recurrence score of 13 predicts a risk of recurrence outside the breast over the next 9 years of 4%, if the patient's only systemic therapy is an antiestrogen for 5 years. It also predicts no significant benefit from chemotherapy.  The patient was last seen by Dr. Jana Hakim on 10/15/2019, during which time it was recommended that she proceed with radiation therapy to greatly reduce her risk of  local recurrence. She is scheduled to begin Anastrozole in December.  On review of systems, the patient reports no complaints. She denies breast pain, lymphedema, and any other symptoms.    Allergies:  has No Known Allergies.  Meds: Current Outpatient Medications  Medication Sig Dispense Refill  . b complex vitamins tablet Take 1 tablet by mouth daily.    . Biotin 5000 MCG TABS Take 1 tablet by mouth daily.    . carbamazepine (TEGRETOL XR) 200 MG 12 hr tablet Take 400 mg by mouth at bedtime.     . Cholecalciferol (VITAMIN D-3) 1000 UNITS CAPS Take 1 capsule by mouth daily.    . Coenzyme Q10 (CO Q 10 PO) Take 300 mg by mouth daily.    . Cyanocobalamin (B-12) 5000 MCG SUBL Place 1 tablet under the tongue every morning.    . furosemide (LASIX) 40 MG tablet Take 40 mg by mouth.    . Magnesium 400 MG TABS Take 1 tablet by mouth daily.    . Multiple Vitamins-Minerals (MULTIVITAMIN WITH MINERALS) tablet Take 1 tablet by mouth daily.    . Omega-3 Fatty Acids (FISH OIL) 1200 MG CAPS Take 3 capsules by mouth 2 (two) times daily.     . potassium chloride (KLOR-CON) 10 MEQ tablet Take 10 mEq by mouth 2 (two) times daily.    . Probiotic Product (PROBIOTIC DAILY PO) Take 2 capsules by mouth daily.    . simvastatin (ZOCOR) 20 MG tablet Take 20 mg by mouth daily.    [name] SYNTHROID 88 MCG tablet Take 1 tablet by mouth Daily.     No current facility-administered medications for this encounter.  Physical Findings: The patient is in no acute distress. Patient is alert and oriented.  height is 5' 1"  (1.549 m) and weight is 192 lb 6.4 oz (87.3 kg). Her temperature is 98.4 F (36.9 C). Her blood pressure is 130/53 (abnormal) and her pulse is 94. Her respiration is 20 and oxygen saturation is 100%.   Lungs are clear to auscultation bilaterally. Heart has regular rate and rhythm. No palpable cervical, supraclavicular, or axillary adenopathy. Abdomen soft, non-tender, normal bowel sounds. Left breast: no  palpable mass, nipple discharge or bleeding. Right breast: Well-healed scar in the upper inner aspect of the breast.  No signs of infection.  No dominant masses in the breast.  No nipple discharge or bleeding.  Axillary scar well-healed  Lab Findings: Lab Results  Component Value Date   WBC 8.2 08/24/2019   HGB 12.3 08/24/2019   HCT 37.9 08/24/2019   MCV 95.7 08/24/2019   PLT 216 08/24/2019    Radiographic Findings: No results found.  Impression: Stage IA (pT1c, pN0) Right Breast UIQ, Invasive Ductal Carcinoma with intermediate grade DCIS, ER+ / PR+ / Her2-, Grade 2  Patient would be a good candidate for adjuvant radiation therapy as breast conserving treatment.  Given the patient's good prognosis we discussed potentially omitting radiation therapy but at this time the patient wishes to be aggressive with her postoperative management treatment including adjuvant radiation therapy and adjuvant hormonal therapy.  She understands that radiation therapy will improve her chances for local control has no impact on overall survival.  Plan:  Patient is scheduled for CT simulation later today.  Treatments to begin approximately a week.  Patient would appear to be a good candidate for hypofractionated accelerated radiation therapy over approximately 4 weeks.  Total time spent in this encounter was 35 minutes which included reviewing the patient's most recent genetic testing, lumpectomy, pathology report, oncotype Dx testing, follow-up with Dr. Jana Hakim, physical examination, and documentation.  -----------------------------------  Blair Promise, PhD, MD  This document serves as a record of services personally performed by Gery Pray, MD. It was created on his behalf by Clerance Lav, a trained medical scribe. The creation of this record is based on the scribe's personal observations and the provider's statements to them. This document has been checked and approved by the attending provider.

## 2019-10-24 ENCOUNTER — Other Ambulatory Visit: Payer: Self-pay

## 2019-10-24 ENCOUNTER — Ambulatory Visit
Admission: RE | Admit: 2019-10-24 | Discharge: 2019-10-24 | Disposition: A | Discharge status: Home / Self Care | Payer: Medicare Other | Source: Ambulatory Visit | Attending: Radiation Oncology | Admitting: Radiation Oncology

## 2019-10-24 ENCOUNTER — Encounter: Payer: Self-pay | Admitting: Radiation Oncology

## 2019-10-24 DIAGNOSIS — Z170 Estrogen receptor positive status [ER+]: Secondary | ICD-10-CM

## 2019-10-24 DIAGNOSIS — Z79899 Other long term (current) drug therapy: Secondary | ICD-10-CM | POA: Diagnosis not present

## 2019-10-24 DIAGNOSIS — Z9221 Personal history of antineoplastic chemotherapy: Secondary | ICD-10-CM | POA: Insufficient documentation

## 2019-10-24 DIAGNOSIS — Z9889 Other specified postprocedural states: Secondary | ICD-10-CM | POA: Diagnosis not present

## 2019-10-24 DIAGNOSIS — Z923 Personal history of irradiation: Secondary | ICD-10-CM | POA: Insufficient documentation

## 2019-10-24 DIAGNOSIS — C50211 Malignant neoplasm of upper-inner quadrant of right female breast: Secondary | ICD-10-CM | POA: Insufficient documentation

## 2019-10-24 DIAGNOSIS — C50411 Malignant neoplasm of upper-outer quadrant of right female breast: Secondary | ICD-10-CM | POA: Insufficient documentation

## 2019-10-24 DIAGNOSIS — Z510 Encounter for antineoplastic radiation therapy: Secondary | ICD-10-CM | POA: Diagnosis not present

## 2019-10-25 ENCOUNTER — Encounter: Payer: Self-pay | Admitting: *Deleted

## 2019-10-29 DIAGNOSIS — C50411 Malignant neoplasm of upper-outer quadrant of right female breast: Secondary | ICD-10-CM | POA: Diagnosis not present

## 2019-10-29 DIAGNOSIS — C50211 Malignant neoplasm of upper-inner quadrant of right female breast: Secondary | ICD-10-CM | POA: Diagnosis not present

## 2019-10-29 DIAGNOSIS — Z510 Encounter for antineoplastic radiation therapy: Secondary | ICD-10-CM | POA: Diagnosis not present

## 2019-10-31 ENCOUNTER — Ambulatory Visit
Admission: RE | Admit: 2019-10-31 | Discharge: 2019-10-31 | Disposition: A | Discharge status: Home / Self Care | Payer: Medicare Other | Source: Ambulatory Visit | Attending: Radiation Oncology | Admitting: Radiation Oncology

## 2019-10-31 ENCOUNTER — Other Ambulatory Visit: Payer: Self-pay

## 2019-10-31 DIAGNOSIS — Z510 Encounter for antineoplastic radiation therapy: Secondary | ICD-10-CM | POA: Diagnosis not present

## 2019-10-31 DIAGNOSIS — C50411 Malignant neoplasm of upper-outer quadrant of right female breast: Secondary | ICD-10-CM | POA: Diagnosis not present

## 2019-10-31 DIAGNOSIS — C50211 Malignant neoplasm of upper-inner quadrant of right female breast: Secondary | ICD-10-CM | POA: Diagnosis not present

## 2019-10-31 DIAGNOSIS — Z170 Estrogen receptor positive status [ER+]: Secondary | ICD-10-CM

## 2019-11-01 ENCOUNTER — Ambulatory Visit
Admission: RE | Admit: 2019-11-01 | Discharge: 2019-11-01 | Disposition: A | Discharge status: Home / Self Care | Payer: Medicare Other | Source: Ambulatory Visit | Attending: Radiation Oncology | Admitting: Radiation Oncology

## 2019-11-01 ENCOUNTER — Other Ambulatory Visit: Payer: Self-pay

## 2019-11-01 DIAGNOSIS — Z510 Encounter for antineoplastic radiation therapy: Secondary | ICD-10-CM | POA: Diagnosis not present

## 2019-11-01 DIAGNOSIS — C50211 Malignant neoplasm of upper-inner quadrant of right female breast: Secondary | ICD-10-CM | POA: Diagnosis not present

## 2019-11-01 DIAGNOSIS — C50411 Malignant neoplasm of upper-outer quadrant of right female breast: Secondary | ICD-10-CM | POA: Diagnosis not present

## 2019-11-02 ENCOUNTER — Other Ambulatory Visit: Payer: Self-pay

## 2019-11-02 ENCOUNTER — Ambulatory Visit
Admission: RE | Admit: 2019-11-02 | Discharge: 2019-11-02 | Disposition: A | Discharge status: Home / Self Care | Payer: Medicare Other | Source: Ambulatory Visit | Attending: Radiation Oncology | Admitting: Radiation Oncology

## 2019-11-02 DIAGNOSIS — Z510 Encounter for antineoplastic radiation therapy: Secondary | ICD-10-CM | POA: Diagnosis not present

## 2019-11-02 DIAGNOSIS — C50411 Malignant neoplasm of upper-outer quadrant of right female breast: Secondary | ICD-10-CM | POA: Diagnosis not present

## 2019-11-02 DIAGNOSIS — C50211 Malignant neoplasm of upper-inner quadrant of right female breast: Secondary | ICD-10-CM | POA: Diagnosis not present

## 2019-11-05 ENCOUNTER — Other Ambulatory Visit: Payer: Self-pay

## 2019-11-05 ENCOUNTER — Ambulatory Visit
Admission: RE | Admit: 2019-11-05 | Discharge: 2019-11-05 | Disposition: A | Discharge status: Home / Self Care | Payer: Medicare Other | Source: Ambulatory Visit | Attending: Radiation Oncology | Admitting: Radiation Oncology

## 2019-11-05 DIAGNOSIS — C50211 Malignant neoplasm of upper-inner quadrant of right female breast: Secondary | ICD-10-CM | POA: Diagnosis not present

## 2019-11-05 DIAGNOSIS — Z510 Encounter for antineoplastic radiation therapy: Secondary | ICD-10-CM | POA: Diagnosis not present

## 2019-11-05 DIAGNOSIS — C50411 Malignant neoplasm of upper-outer quadrant of right female breast: Secondary | ICD-10-CM | POA: Diagnosis not present

## 2019-11-06 ENCOUNTER — Other Ambulatory Visit: Payer: Self-pay

## 2019-11-06 ENCOUNTER — Ambulatory Visit
Admission: RE | Admit: 2019-11-06 | Discharge: 2019-11-06 | Disposition: A | Discharge status: Home / Self Care | Payer: Medicare Other | Source: Ambulatory Visit | Attending: Radiation Oncology | Admitting: Radiation Oncology

## 2019-11-06 DIAGNOSIS — C50211 Malignant neoplasm of upper-inner quadrant of right female breast: Secondary | ICD-10-CM | POA: Diagnosis not present

## 2019-11-06 DIAGNOSIS — C50411 Malignant neoplasm of upper-outer quadrant of right female breast: Secondary | ICD-10-CM | POA: Diagnosis not present

## 2019-11-06 DIAGNOSIS — Z510 Encounter for antineoplastic radiation therapy: Secondary | ICD-10-CM | POA: Diagnosis not present

## 2019-11-06 MED ORDER — ALRA NON-METALLIC DEODORANT (RAD-ONC)
1.0000 "application " | Freq: Once | TOPICAL | Status: AC
  Administered 2019-11-06: 1 via TOPICAL

## 2019-11-06 MED ORDER — RADIAPLEXRX EX GEL: Freq: Once | CUTANEOUS | Status: AC

## 2019-11-06 NOTE — Progress Notes (Signed)

## 2019-11-07 ENCOUNTER — Ambulatory Visit
Admission: RE | Admit: 2019-11-07 | Discharge: 2019-11-07 | Disposition: A | Discharge status: Home / Self Care | Payer: Medicare Other | Source: Ambulatory Visit | Attending: Radiation Oncology | Admitting: Radiation Oncology

## 2019-11-07 ENCOUNTER — Other Ambulatory Visit: Payer: Self-pay

## 2019-11-07 DIAGNOSIS — Z510 Encounter for antineoplastic radiation therapy: Secondary | ICD-10-CM | POA: Diagnosis not present

## 2019-11-07 DIAGNOSIS — C50411 Malignant neoplasm of upper-outer quadrant of right female breast: Secondary | ICD-10-CM | POA: Diagnosis not present

## 2019-11-07 DIAGNOSIS — C50211 Malignant neoplasm of upper-inner quadrant of right female breast: Secondary | ICD-10-CM | POA: Diagnosis not present

## 2019-11-08 ENCOUNTER — Ambulatory Visit
Admission: RE | Admit: 2019-11-08 | Discharge: 2019-11-08 | Disposition: A | Discharge status: Home / Self Care | Payer: Medicare Other | Source: Ambulatory Visit | Attending: Radiation Oncology | Admitting: Radiation Oncology

## 2019-11-08 ENCOUNTER — Other Ambulatory Visit: Payer: Self-pay

## 2019-11-08 DIAGNOSIS — C50411 Malignant neoplasm of upper-outer quadrant of right female breast: Secondary | ICD-10-CM | POA: Diagnosis not present

## 2019-11-08 DIAGNOSIS — C50211 Malignant neoplasm of upper-inner quadrant of right female breast: Secondary | ICD-10-CM | POA: Diagnosis not present

## 2019-11-08 DIAGNOSIS — Z510 Encounter for antineoplastic radiation therapy: Secondary | ICD-10-CM | POA: Diagnosis not present

## 2019-11-09 ENCOUNTER — Ambulatory Visit
Admission: RE | Admit: 2019-11-09 | Discharge: 2019-11-09 | Disposition: A | Discharge status: Home / Self Care | Payer: Medicare Other | Source: Ambulatory Visit | Attending: Radiation Oncology | Admitting: Radiation Oncology

## 2019-11-09 DIAGNOSIS — Z510 Encounter for antineoplastic radiation therapy: Secondary | ICD-10-CM | POA: Diagnosis not present

## 2019-11-09 DIAGNOSIS — C50411 Malignant neoplasm of upper-outer quadrant of right female breast: Secondary | ICD-10-CM | POA: Diagnosis not present

## 2019-11-09 DIAGNOSIS — C50211 Malignant neoplasm of upper-inner quadrant of right female breast: Secondary | ICD-10-CM | POA: Diagnosis not present

## 2019-11-12 ENCOUNTER — Ambulatory Visit
Admission: RE | Admit: 2019-11-12 | Discharge: 2019-11-12 | Disposition: A | Discharge status: Home / Self Care | Payer: Medicare Other | Source: Ambulatory Visit | Attending: Radiation Oncology | Admitting: Radiation Oncology

## 2019-11-12 ENCOUNTER — Other Ambulatory Visit: Payer: Self-pay

## 2019-11-12 DIAGNOSIS — C50411 Malignant neoplasm of upper-outer quadrant of right female breast: Secondary | ICD-10-CM | POA: Diagnosis not present

## 2019-11-12 DIAGNOSIS — Z510 Encounter for antineoplastic radiation therapy: Secondary | ICD-10-CM | POA: Diagnosis not present

## 2019-11-12 DIAGNOSIS — C50211 Malignant neoplasm of upper-inner quadrant of right female breast: Secondary | ICD-10-CM | POA: Diagnosis not present

## 2019-11-13 ENCOUNTER — Ambulatory Visit
Admission: RE | Admit: 2019-11-13 | Discharge: 2019-11-13 | Disposition: A | Discharge status: Home / Self Care | Payer: Medicare Other | Source: Ambulatory Visit | Attending: Radiation Oncology | Admitting: Radiation Oncology

## 2019-11-13 ENCOUNTER — Other Ambulatory Visit: Payer: Self-pay

## 2019-11-13 DIAGNOSIS — Z510 Encounter for antineoplastic radiation therapy: Secondary | ICD-10-CM | POA: Diagnosis not present

## 2019-11-13 DIAGNOSIS — C50211 Malignant neoplasm of upper-inner quadrant of right female breast: Secondary | ICD-10-CM | POA: Diagnosis not present

## 2019-11-13 DIAGNOSIS — C50411 Malignant neoplasm of upper-outer quadrant of right female breast: Secondary | ICD-10-CM | POA: Diagnosis not present

## 2019-11-14 ENCOUNTER — Ambulatory Visit
Admission: RE | Admit: 2019-11-14 | Discharge: 2019-11-14 | Disposition: A | Discharge status: Home / Self Care | Payer: Medicare Other | Source: Ambulatory Visit | Attending: Radiation Oncology | Admitting: Radiation Oncology

## 2019-11-14 DIAGNOSIS — C50211 Malignant neoplasm of upper-inner quadrant of right female breast: Secondary | ICD-10-CM | POA: Diagnosis not present

## 2019-11-14 DIAGNOSIS — Z510 Encounter for antineoplastic radiation therapy: Secondary | ICD-10-CM | POA: Diagnosis not present

## 2019-11-14 DIAGNOSIS — C50411 Malignant neoplasm of upper-outer quadrant of right female breast: Secondary | ICD-10-CM | POA: Diagnosis not present

## 2019-11-15 ENCOUNTER — Telehealth: Payer: Self-pay | Admitting: *Deleted

## 2019-11-15 ENCOUNTER — Ambulatory Visit
Admission: RE | Admit: 2019-11-15 | Discharge: 2019-11-15 | Disposition: A | Discharge status: Home / Self Care | Payer: Medicare Other | Source: Ambulatory Visit | Attending: Radiation Oncology | Admitting: Radiation Oncology

## 2019-11-15 DIAGNOSIS — C50411 Malignant neoplasm of upper-outer quadrant of right female breast: Secondary | ICD-10-CM | POA: Diagnosis not present

## 2019-11-15 DIAGNOSIS — Z510 Encounter for antineoplastic radiation therapy: Secondary | ICD-10-CM | POA: Diagnosis not present

## 2019-11-15 DIAGNOSIS — C50211 Malignant neoplasm of upper-inner quadrant of right female breast: Secondary | ICD-10-CM | POA: Diagnosis not present

## 2019-11-15 MED ORDER — ANASTROZOLE 1 MG PO TABS
1.0000 mg | ORAL_TABLET | Freq: Every day | ORAL | 3 refills | Status: DC
Start: 2019-11-15 — End: 2020-03-04

## 2019-11-15 NOTE — Telephone Encounter (Signed)
Pt requested to speak with nurse per her check in prior to rad appt.  [name] [name] states she is concerned about when to start the anti estrogen " because my tumor was 100% estrogen positive and I am not doing anything for the other breast "  This RN validated her concern, informed her the current 100% estrogen is based on the known cancer currently under treatment - and stands alone ( doesn't mean that the left breast is affected).  Pt states she would prefer not to wait until her December follow up to start the medication.  This RN discussed concern with starting during radiation and if side effects occurred may be difficult to know if radiation or new medication is the cause.  Char stating understanding of above and is asking if she could have medication in hand so she can start as soon as radiation is completed.  This RN verified phx.  Per MD above is appropriate.

## 2019-11-16 ENCOUNTER — Ambulatory Visit
Admission: RE | Admit: 2019-11-16 | Discharge: 2019-11-16 | Disposition: A | Discharge status: Home / Self Care | Payer: Medicare Other | Source: Ambulatory Visit | Attending: Radiation Oncology | Admitting: Radiation Oncology

## 2019-11-16 DIAGNOSIS — Z510 Encounter for antineoplastic radiation therapy: Secondary | ICD-10-CM | POA: Insufficient documentation

## 2019-11-16 DIAGNOSIS — C50211 Malignant neoplasm of upper-inner quadrant of right female breast: Secondary | ICD-10-CM | POA: Diagnosis not present

## 2019-11-16 DIAGNOSIS — C50411 Malignant neoplasm of upper-outer quadrant of right female breast: Secondary | ICD-10-CM | POA: Insufficient documentation

## 2019-11-19 ENCOUNTER — Ambulatory Visit
Admission: RE | Admit: 2019-11-19 | Discharge: 2019-11-19 | Disposition: A | Discharge status: Home / Self Care | Payer: Medicare Other | Source: Ambulatory Visit | Attending: Radiation Oncology | Admitting: Radiation Oncology

## 2019-11-19 DIAGNOSIS — C50411 Malignant neoplasm of upper-outer quadrant of right female breast: Secondary | ICD-10-CM | POA: Diagnosis not present

## 2019-11-19 DIAGNOSIS — Z510 Encounter for antineoplastic radiation therapy: Secondary | ICD-10-CM | POA: Diagnosis not present

## 2019-11-19 DIAGNOSIS — C50211 Malignant neoplasm of upper-inner quadrant of right female breast: Secondary | ICD-10-CM | POA: Diagnosis not present

## 2019-11-20 ENCOUNTER — Ambulatory Visit: Payer: Medicare Other | Admitting: Radiation Oncology

## 2019-11-20 ENCOUNTER — Ambulatory Visit
Admission: RE | Admit: 2019-11-20 | Discharge: 2019-11-20 | Disposition: A | Discharge status: Home / Self Care | Payer: Medicare Other | Source: Ambulatory Visit | Attending: Radiation Oncology | Admitting: Radiation Oncology

## 2019-11-20 DIAGNOSIS — Z510 Encounter for antineoplastic radiation therapy: Secondary | ICD-10-CM | POA: Diagnosis not present

## 2019-11-20 DIAGNOSIS — C50411 Malignant neoplasm of upper-outer quadrant of right female breast: Secondary | ICD-10-CM | POA: Diagnosis not present

## 2019-11-20 DIAGNOSIS — C50211 Malignant neoplasm of upper-inner quadrant of right female breast: Secondary | ICD-10-CM | POA: Diagnosis not present

## 2019-11-21 ENCOUNTER — Ambulatory Visit
Admission: RE | Admit: 2019-11-21 | Discharge: 2019-11-21 | Disposition: A | Discharge status: Home / Self Care | Payer: Medicare Other | Source: Ambulatory Visit | Attending: Radiation Oncology | Admitting: Radiation Oncology

## 2019-11-21 DIAGNOSIS — Z510 Encounter for antineoplastic radiation therapy: Secondary | ICD-10-CM | POA: Diagnosis not present

## 2019-11-21 DIAGNOSIS — C50411 Malignant neoplasm of upper-outer quadrant of right female breast: Secondary | ICD-10-CM | POA: Diagnosis not present

## 2019-11-21 DIAGNOSIS — C50211 Malignant neoplasm of upper-inner quadrant of right female breast: Secondary | ICD-10-CM | POA: Diagnosis not present

## 2019-11-22 ENCOUNTER — Ambulatory Visit
Admission: RE | Admit: 2019-11-22 | Discharge: 2019-11-22 | Disposition: A | Discharge status: Home / Self Care | Payer: Medicare Other | Source: Ambulatory Visit | Attending: Radiation Oncology | Admitting: Radiation Oncology

## 2019-11-22 DIAGNOSIS — C50211 Malignant neoplasm of upper-inner quadrant of right female breast: Secondary | ICD-10-CM | POA: Diagnosis not present

## 2019-11-22 DIAGNOSIS — Z510 Encounter for antineoplastic radiation therapy: Secondary | ICD-10-CM | POA: Diagnosis not present

## 2019-11-22 DIAGNOSIS — C50411 Malignant neoplasm of upper-outer quadrant of right female breast: Secondary | ICD-10-CM | POA: Diagnosis not present

## 2019-11-23 ENCOUNTER — Ambulatory Visit
Admission: RE | Admit: 2019-11-23 | Discharge: 2019-11-23 | Disposition: A | Discharge status: Home / Self Care | Payer: Medicare Other | Source: Ambulatory Visit | Attending: Radiation Oncology | Admitting: Radiation Oncology

## 2019-11-23 DIAGNOSIS — C50211 Malignant neoplasm of upper-inner quadrant of right female breast: Secondary | ICD-10-CM | POA: Diagnosis not present

## 2019-11-23 DIAGNOSIS — C50411 Malignant neoplasm of upper-outer quadrant of right female breast: Secondary | ICD-10-CM | POA: Diagnosis not present

## 2019-11-23 DIAGNOSIS — Z510 Encounter for antineoplastic radiation therapy: Secondary | ICD-10-CM | POA: Diagnosis not present

## 2019-11-26 ENCOUNTER — Ambulatory Visit
Admission: RE | Admit: 2019-11-26 | Discharge: 2019-11-26 | Disposition: A | Discharge status: Home / Self Care | Payer: Medicare Other | Source: Ambulatory Visit | Attending: Radiation Oncology | Admitting: Radiation Oncology

## 2019-11-26 ENCOUNTER — Ambulatory Visit: Payer: Self-pay

## 2019-11-26 ENCOUNTER — Encounter: Payer: Self-pay | Admitting: *Deleted

## 2019-11-26 DIAGNOSIS — Z510 Encounter for antineoplastic radiation therapy: Secondary | ICD-10-CM | POA: Diagnosis not present

## 2019-11-26 DIAGNOSIS — C50411 Malignant neoplasm of upper-outer quadrant of right female breast: Secondary | ICD-10-CM | POA: Diagnosis not present

## 2019-11-26 DIAGNOSIS — C50211 Malignant neoplasm of upper-inner quadrant of right female breast: Secondary | ICD-10-CM | POA: Diagnosis not present

## 2019-11-27 ENCOUNTER — Ambulatory Visit
Admission: RE | Admit: 2019-11-27 | Discharge: 2019-11-27 | Disposition: A | Discharge status: Home / Self Care | Payer: Medicare Other | Source: Ambulatory Visit | Attending: Radiation Oncology | Admitting: Radiation Oncology

## 2019-11-27 ENCOUNTER — Encounter: Payer: Self-pay | Admitting: Radiation Oncology

## 2019-11-27 ENCOUNTER — Other Ambulatory Visit: Payer: Self-pay

## 2019-11-27 DIAGNOSIS — Z510 Encounter for antineoplastic radiation therapy: Secondary | ICD-10-CM | POA: Diagnosis not present

## 2019-11-27 DIAGNOSIS — C50211 Malignant neoplasm of upper-inner quadrant of right female breast: Secondary | ICD-10-CM | POA: Diagnosis not present

## 2019-11-27 DIAGNOSIS — C50411 Malignant neoplasm of upper-outer quadrant of right female breast: Secondary | ICD-10-CM | POA: Diagnosis not present

## 2019-12-03 DIAGNOSIS — E039 Hypothyroidism, unspecified: Secondary | ICD-10-CM | POA: Diagnosis not present

## 2019-12-03 DIAGNOSIS — N1831 Chronic kidney disease, stage 3a: Secondary | ICD-10-CM | POA: Diagnosis not present

## 2019-12-03 DIAGNOSIS — I129 Hypertensive chronic kidney disease with stage 1 through stage 4 chronic kidney disease, or unspecified chronic kidney disease: Secondary | ICD-10-CM | POA: Diagnosis not present

## 2019-12-03 DIAGNOSIS — N281 Cyst of kidney, acquired: Secondary | ICD-10-CM | POA: Diagnosis not present

## 2019-12-10 ENCOUNTER — Other Ambulatory Visit: Payer: Self-pay

## 2019-12-10 ENCOUNTER — Ambulatory Visit: Payer: Medicare Other | Attending: Surgery

## 2019-12-10 DIAGNOSIS — Z483 Aftercare following surgery for neoplasm: Secondary | ICD-10-CM | POA: Insufficient documentation

## 2019-12-10 NOTE — Therapy (Signed)
[place], [place], [address] Phone: [phone]   Fax:  [phone]  Physical Therapy Treatment  Patient Details  Name: [name] [name] MRN: [id number] Date of Birth: 02/03/1946 Referring Provider (PT): Dr. Erroll Luna   Encounter Date: 12/10/2019   PT End of Session - 12/10/19 1605    Visit Number 2   # unchanged due to screen only   Number of Visits 2    Date for PT Re-Evaluation 09/26/19    PT Start Time 2376    PT Stop Time 1606    PT Time Calculation (min) 19 min    Activity Tolerance Patient tolerated treatment well    Behavior During Therapy [facility] for tasks assessed/performed           Past Medical History:  Diagnosis Date  . Anxiety    being weaned off lithium will be off on 06/23/11  . Bipolar disorder ([place])   . Cancer [facility]) 07/2019   right breast IDC with DCIS  . Diverticulosis 09/01/2011   mild, left sided  . Family history of breast cancer   . Family history of colon cancer   . Hyperlipidemia   . Hypothyroidism   . Leg cramps   . Numbness and tingling in left arm   . Osteopenia   . Stage 3 chronic kidney disease    CKD stg 3    Past Surgical History:  Procedure Laterality Date  . BREAST LUMPECTOMY WITH RADIOACTIVE SEED AND SENTINEL LYMPH NODE BIOPSY Right 08/29/2019   Procedure: RIGHT BREAST LUMPECTOMY WITH RADIOACTIVE SEED AND SENTINEL LYMPH NODE BIOPSY;  Surgeon: Erroll Luna, MD;  Location: [facility];  Service: General;  Laterality: Right;  . COLONOSCOPY  06/09/06   Dr.Orr  . COLONOSCOPY  09/01/2011  . DILATATION & CURETTAGE/HYSTEROSCOPY WITH TRUECLEAR N/A 11/12/2013   Procedure: RESECTOSCOPIC POLYPECTOMY Irish Lack, D&C;  Surgeon: Terrance Mass, MD;  Location: [facility];  Service: Gynecology;  Laterality: N/A;  . ENDOMETRIAL BIOPSY  09/14/2013   Dr. Uvaldo Rising    There were no vitals filed for this visit.   Subjective Assessment - 12/10/19 1601     Subjective Pt retrurns for 3 month L-Dex screen    Pertinent History Patient was diagnosed on 07/12/2019 with right grade II invasive ductal carcinoma breast cancer. Patient reports she underwent a right lumpectomy and sentinel node biopsy (4 negative nodes) on 08/29/2019. It is ER/PR positive and HER2 negative with a Ki67 of 2%.                  L-DEX FLOWSHEETS - 12/10/19 1600      L-DEX LYMPHEDEMA SCREENING   BASELINE SCORE (UNILATERAL) -4.2    L-DEX SCORE (UNILATERAL) -3.9    VALUE CHANGE (UNILAT) 0.3                                  PT Long Term Goals - 09/24/19 1613      PT LONG TERM GOAL #1   Title Patient will demonstrate she has regained full shoulder ROM and function post operatively compared to baselines.    Time 8    Period Weeks    Status Achieved                 Plan - 12/10/19 1607    Clinical Impression Statement Pt returns to clinic for 3 month L-Dex screen. Her change  from baseline of 0.3 is WNLs so no further treatment is needed at this time. She did report noticing some tingling in her Rt arm lately when sitting at her desk for porolonged periods of time and reports not doing much in way of stretching or walking. So tried encouraging pt to resume every day walks working up to daily and really perform stretches issued ABC class a few weeks ago. Pt was able to verbalize understanding importance of this and seemed encouraged before leaving.    PT Next Visit Plan f/u every 3 months for L-Dex screenings    PT Home Exercise Plan Post op shoulder ROM HEP and ABC class stretches    Consulted and Agree with Plan of Care Patient           Patient will benefit from skilled therapeutic intervention in order to improve the following deficits and impairments:     Visit Diagnosis: Aftercare following surgery for neoplasm     Problem List Patient Active Problem List   Diagnosis Date Noted  . Genetic testing 08/10/2019  .  Family history of breast cancer   . Family history of colon cancer   . Malignant neoplasm of upper-inner quadrant of right breast in female, estrogen receptor positive ([place]) 07/30/2019  . Osteopenia 09/10/2016  . Vaginal atrophy 09/10/2015  . Menopause 09/10/2015    Otelia Limes, PTA 12/10/2019, [place], [place], [address] Phone: [phone]   Fax:  [phone]  Name: [name] [name] MRN: [id number] Date of Birth: 28-Feb-1945

## 2019-12-12 ENCOUNTER — Other Ambulatory Visit: Payer: Self-pay | Admitting: Internal Medicine

## 2019-12-12 DIAGNOSIS — I129 Hypertensive chronic kidney disease with stage 1 through stage 4 chronic kidney disease, or unspecified chronic kidney disease: Secondary | ICD-10-CM

## 2019-12-19 ENCOUNTER — Ambulatory Visit
Admission: RE | Admit: 2019-12-19 | Discharge: 2019-12-19 | Disposition: A | Discharge status: Home / Self Care | Payer: Medicare Other | Source: Ambulatory Visit | Attending: Internal Medicine | Admitting: Internal Medicine

## 2019-12-19 DIAGNOSIS — N281 Cyst of kidney, acquired: Secondary | ICD-10-CM | POA: Diagnosis not present

## 2019-12-19 DIAGNOSIS — I129 Hypertensive chronic kidney disease with stage 1 through stage 4 chronic kidney disease, or unspecified chronic kidney disease: Secondary | ICD-10-CM

## 2019-12-19 DIAGNOSIS — I10 Essential (primary) hypertension: Secondary | ICD-10-CM | POA: Diagnosis not present

## 2019-12-31 ENCOUNTER — Other Ambulatory Visit: Payer: Self-pay

## 2019-12-31 ENCOUNTER — Ambulatory Visit
Admission: RE | Admit: 2019-12-31 | Discharge: 2019-12-31 | Disposition: A | Discharge status: Home / Self Care | Payer: Medicare Other | Source: Ambulatory Visit | Attending: Radiation Oncology | Admitting: Radiation Oncology

## 2019-12-31 ENCOUNTER — Encounter: Payer: Self-pay | Admitting: Radiation Oncology

## 2019-12-31 DIAGNOSIS — N281 Cyst of kidney, acquired: Secondary | ICD-10-CM | POA: Diagnosis not present

## 2019-12-31 DIAGNOSIS — C50211 Malignant neoplasm of upper-inner quadrant of right female breast: Secondary | ICD-10-CM | POA: Insufficient documentation

## 2019-12-31 DIAGNOSIS — Z79811 Long term (current) use of aromatase inhibitors: Secondary | ICD-10-CM | POA: Insufficient documentation

## 2019-12-31 DIAGNOSIS — L249 Irritant contact dermatitis, unspecified cause: Secondary | ICD-10-CM | POA: Diagnosis not present

## 2019-12-31 DIAGNOSIS — Z923 Personal history of irradiation: Secondary | ICD-10-CM | POA: Insufficient documentation

## 2019-12-31 DIAGNOSIS — Z170 Estrogen receptor positive status [ER+]: Secondary | ICD-10-CM | POA: Insufficient documentation

## 2019-12-31 DIAGNOSIS — Z79899 Other long term (current) drug therapy: Secondary | ICD-10-CM | POA: Diagnosis not present

## 2019-12-31 DIAGNOSIS — L218 Other seborrheic dermatitis: Secondary | ICD-10-CM | POA: Diagnosis not present

## 2019-12-31 DIAGNOSIS — L820 Inflamed seborrheic keratosis: Secondary | ICD-10-CM | POA: Diagnosis not present

## 2019-12-31 DIAGNOSIS — D225 Melanocytic nevi of trunk: Secondary | ICD-10-CM | POA: Diagnosis not present

## 2019-12-31 NOTE — Progress Notes (Signed)
Radiation Oncology         [phone] ________________________________  Name: [name] [name] MRN: [id number]  Date: 12/31/2019  DOB: 20-Feb-1945  Follow-Up Visit Note  CC: Janie Morning, DO  Magrinat, Virgie Dad, MD    ICD-10-CM   1. Malignant neoplasm of upper-inner quadrant of right breast in female, estrogen receptor positive ([place])  C50.211    Z17.0     Diagnosis: StageIA(pT1c, pN0)RightBreastUIQ,InvasiveDuctalCarcinoma withintermediate gradeDCIS, ER+/ PR+/ Her2-, Grade2  Interval Since Last Radiation: One month and three days  Radiation Treatment Dates: 10/31/2019 through 11/27/2019 Site Technique Total Dose (Gy) Dose per Fx (Gy) Completed Fx Beam Energies  Breast, Right: Breast_Rt 3D 40.05/40.05 2.67 15/15 6X, 10X, 15X  Breast, Right: Breast_Rt_Bst 3D 10/10 2 5/5 6X, 15X    Narrative:  The patient returns today for routine follow-up. Since the end of treatment, the patient has been seen by physical therapy for surgery aftercare. Of note, she did undergo a renal ultrasound on 12/19/2019 that showed diffusely increased echogenicity within the renal parenchyma with poor corticomedullary differentiation, consistent with medical renal disease. There were also multiple small bilateral renal cysts but was no hydronephrosis.  On review of systems, she reports feeling well.  She denies any residual fatigue. She denies pain within the breast area itching or swelling within her right arm or hand..                       ALLERGIES:  has No Known Allergies.  Meds: Current Outpatient Medications  Medication Sig Dispense Refill   anastrozole (ARIMIDEX) 1 MG tablet Take 1 tablet (1 mg total) by mouth daily. 30 tablet 3   b complex vitamins tablet Take 1 tablet by mouth daily.     Biotin 5000 MCG TABS Take 1 tablet by mouth daily.     carbamazepine (TEGRETOL XR) 200 MG 12 hr tablet Take 400 mg by mouth at bedtime.      Cholecalciferol (VITAMIN D-3) 1000 UNITS CAPS Take 1  capsule by mouth daily.     Coenzyme Q10 (CO Q 10 PO) Take 300 mg by mouth daily.     Cyanocobalamin (B-12) 5000 MCG SUBL Place 1 tablet under the tongue every morning.     furosemide (LASIX) 40 MG tablet Take 40 mg by mouth.     Magnesium 400 MG TABS Take 1 tablet by mouth daily.     Multiple Vitamins-Minerals (MULTIVITAMIN WITH MINERALS) tablet Take 1 tablet by mouth daily.     Omega-3 Fatty Acids (FISH OIL) 1200 MG CAPS Take 3 capsules by mouth 2 (two) times daily.      potassium chloride (KLOR-CON) 10 MEQ tablet Take 10 mEq by mouth 2 (two) times daily.     Probiotic Product (PROBIOTIC DAILY PO) Take 2 capsules by mouth daily.     simvastatin (ZOCOR) 20 MG tablet Take 20 mg by mouth daily.     SYNTHROID 88 MCG tablet Take 1 tablet by mouth Daily.     No current facility-administered medications for this encounter.    Physical Findings: The patient is in no acute distress. Patient is alert and oriented.  height is 5' 1"  (1.549 m) and weight is 194 lb 12.8 oz (88.4 kg). Her temperature is 98.3 F (36.8 C). Her blood pressure is 149/69 (abnormal) and her pulse is 94. Her respiration is 20 and oxygen saturation is 100%.   Lungs are clear to auscultation bilaterally. Heart has regular rate and rhythm. No palpable cervical, supraclavicular,  or axillary adenopathy. Abdomen soft, non-tender, normal bowel sounds.  Right breast: Skin is healed well.  Some hyperpigmentation changes and mild edema.  No dominant mass appreciated in the breast,  nipple discharge or bleeding.  Lab Findings: Lab Results  Component Value Date   WBC 8.2 08/24/2019   HGB 12.3 08/24/2019   HCT 37.9 08/24/2019   MCV 95.7 08/24/2019   PLT 216 08/24/2019    Radiographic Findings: US RENAL  Result Date: 12/19/2019 CLINICAL DATA:  Initial evaluation for hypertension, renal disease. EXAM: RENAL / URINARY TRACT ULTRASOUND COMPLETE COMPARISON:  Prior ultrasound from 03/23/2010. FINDINGS: Right Kidney: Renal  measurements: 11.2 x 4.5 x 4.7 cm = volume: 124 mL. Diffusely increased echogenicity within the renal parenchyma with poor corticomedullary differentiation. No nephrolithiasis or hydronephrosis. 2.5 x 1.9 x 2.7 cm simple cyst present at the lower pole. Additional 1.9 x 1.9 x 1.9 cm simple cyst present at the lower pole as well. Left Kidney: Renal measurements: 10.4 x 4.4 x 5.0 cm = volume: 119 mL. Diffusely increased echogenicity within the renal parenchyma with poor corticomedullary differentiation. No nephrolithiasis or hydronephrosis. 1.2 x 1.2 x 1.0 cm simple cyst present at the upper pole. Additional 1.3 x 1.3 x 1.1 cm simple cyst present at the lower pole. Bladder: Appears normal for degree of bladder distention. Other: None. IMPRESSION: 1. Diffusely increased echogenicity within the renal parenchyma with poor corticomedullary differentiation, consistent with medical renal disease. No hydronephrosis. 2. Multiple simple bilateral renal cysts as detailed above. Electronically Signed   By: Jeannine Boga M.D.   On: 12/19/2019 18:45    Impression: StageIA(pT1c, pN0)RightBreastUIQ,InvasiveDuctalCarcinoma withintermediate gradeDCIS, ER+/ PR+/ Her2-, Grade2  The patient has recovered well from her radiation therapy.  No evidence of recurrence on clinical exam today.  Plan: The patient is scheduled to follow up with Dr. Jana Hakim on 01/22/2020. She will follow up with radiation oncology in as needed basis in light of her follow-up with medical oncology and surgery   ____________________________________   Blair Promise, PhD, MD  This document serves as a record of services personally performed by Gery Pray, MD. It was created on his behalf by Clerance Lav, a trained medical scribe. The creation of this record is based on the scribe's personal observations and the provider's statements to them. This document has been checked and approved by the attending provider.

## 2019-12-31 NOTE — Progress Notes (Signed)
Patient here for a 1 month f/u visit with Dr. Sondra Come. Patient denies amy problems with her right breast.  BP (!) 149/69 (BP Location: Left Arm, Patient Position: Sitting, Cuff Size: Normal)   Pulse 94   Temp 98.3 F (36.8 C)   Resp 20   Ht 5\' 1"  (1.549 m)   Wt 194 lb 12.8 oz (88.4 kg)   SpO2 100%   BMI 36.81 kg/m   Wt Readings from Last 3 Encounters:  12/31/19 194 lb 12.8 oz (88.4 kg)  10/24/19 192 lb 6.4 oz (87.3 kg)  10/15/19 192 lb (87.1 kg)

## 2019-12-31 NOTE — Progress Notes (Incomplete)
  Patient Name: [name] [name] MRN: [id number] DOB: Mar 09, 1945 Referring Physician: Lurline Del (Profile Not Attached) Date of Service: 11/27/2019 [facility], [place]                                                        End Of Treatment Note  Diagnoses: C50.211-Malignant neoplasm of upper-inner quadrant of right female breast  Cancer Staging: StageIA(pT1c, pN0) RightBreastUIQ,InvasiveDuctalCarcinoma with intermediate gradeDCIS, ER+/ PR+/ Her2-, Grade2  Intent: Curative  Radiation Treatment Dates: 10/31/2019 through 11/27/2019 Site Technique Total Dose (Gy) Dose per Fx (Gy) Completed Fx Beam Energies  Breast, Right: Breast_Rt 3D 40.05/40.05 2.67 15/15 6X, 10X, 15X  Breast, Right: Breast_Rt_Bst 3D 10/10 2 5/5 6X, 15X   Narrative: The patient tolerated radiation therapy relatively well. She did report some mild fatigue and right nipple sensitivity. During the first half of treatment, there was noted to be some erythema and mild hyperpigmentation changes to the right breast area without signs of infection. Towards the end of treatment, there was erythema throughout the breast, more-so in the inframammary fold, without moist desquamation.   Plan: The patient will follow-up with radiation oncology in one month.  ________________________________________________   Blair Promise, PhD, MD  This document serves as a record of services personally performed by Gery Pray, MD. It was created on his behalf by Clerance Lav, a trained medical scribe. The creation of this record is based on the scribe's personal observations and the provider's statements to them. This document has been checked and approved by the attending provider.

## 2020-01-04 NOTE — Telephone Encounter (Signed)
No entry 

## 2020-01-08 DIAGNOSIS — R7309 Other abnormal glucose: Secondary | ICD-10-CM | POA: Diagnosis not present

## 2020-01-08 DIAGNOSIS — E039 Hypothyroidism, unspecified: Secondary | ICD-10-CM | POA: Diagnosis not present

## 2020-01-08 DIAGNOSIS — E7800 Pure hypercholesterolemia, unspecified: Secondary | ICD-10-CM | POA: Diagnosis not present

## 2020-01-08 DIAGNOSIS — N183 Chronic kidney disease, stage 3 unspecified: Secondary | ICD-10-CM | POA: Diagnosis not present

## 2020-01-09 ENCOUNTER — Other Ambulatory Visit: Payer: Self-pay

## 2020-01-09 ENCOUNTER — Ambulatory Visit (INDEPENDENT_AMBULATORY_CARE_PROVIDER_SITE_OTHER): Payer: Medicare Other | Admitting: Obstetrics & Gynecology

## 2020-01-09 ENCOUNTER — Encounter: Payer: Self-pay | Admitting: Obstetrics & Gynecology

## 2020-01-09 VITALS — BP 130/82 | Ht 60.0 in | Wt 190.0 lb

## 2020-01-09 DIAGNOSIS — M8580 Other specified disorders of bone density and structure, unspecified site: Secondary | ICD-10-CM

## 2020-01-09 DIAGNOSIS — Z780 Asymptomatic menopausal state: Secondary | ICD-10-CM

## 2020-01-09 DIAGNOSIS — Z6837 Body mass index (BMI) 37.0-37.9, adult: Secondary | ICD-10-CM

## 2020-01-09 DIAGNOSIS — C50211 Malignant neoplasm of upper-inner quadrant of right female breast: Secondary | ICD-10-CM

## 2020-01-09 DIAGNOSIS — Z170 Estrogen receptor positive status [ER+]: Secondary | ICD-10-CM

## 2020-01-09 DIAGNOSIS — Z9289 Personal history of other medical treatment: Secondary | ICD-10-CM

## 2020-01-09 DIAGNOSIS — Z01419 Encounter for gynecological examination (general) (routine) without abnormal findings: Secondary | ICD-10-CM

## 2020-01-09 NOTE — Progress Notes (Signed)
[name] [name] [birth year] [id number]   History:    74 y.o. G1P1L1 Married. Son 74 yo lives with them with his 70 yo daughter.  HU:[id number] presenting for annual gyn exam   XAJ:OINOMVEHMCNOB, well on no HRT. No PMB.  No pelvic pain.  Vagifem every 2 weeks for atrophic vaginitis. No PMB. No pelvic pain. Abstinent. Urine/BMs wnl.  Breasts normal. BMI 37.11.  Not physically active. Normal colonoscopy in 2013, but was on a 5-year schedule because of her father's history of colon cancer. Health labs with Fam MD.  Past medical history,surgical history, family history and social history were all reviewed and documented in the EPIC chart.  Gynecologic History No LMP recorded. Patient is postmenopausal.  Obstetric History OB History  Gravida Para Term Preterm AB Living  2 2       2   SAB TAB Ectopic Multiple Live Births               # Outcome Date GA Lbr Len/2nd Weight Sex Delivery Anes PTL Lv  2 Para      Vag-Spont     1 Para      Vag-Spont        ROS: A ROS was performed and pertinent positives and negatives are included in the history.  GENERAL: No fevers or chills. HEENT: No change in vision, no earache, sore throat or sinus congestion. NECK: No pain or stiffness. CARDIOVASCULAR: No chest pain or pressure. No palpitations. PULMONARY: No shortness of breath, cough or wheeze. GASTROINTESTINAL: No abdominal pain, nausea, vomiting or diarrhea, melena or bright red blood per rectum. GENITOURINARY: No urinary frequency, urgency, hesitancy or dysuria. MUSCULOSKELETAL: No joint or muscle pain, no back pain, no recent trauma. DERMATOLOGIC: No rash, no itching, no lesions. ENDOCRINE: No polyuria, polydipsia, no heat or cold intolerance. No recent change in weight. HEMATOLOGICAL: No anemia or easy bruising or bleeding. NEUROLOGIC: No headache, seizures, numbness, tingling or weakness. PSYCHIATRIC: No depression, no loss of interest in normal activity or change in sleep  pattern.     Exam:   BP 130/82    Ht 5' (1.524 m)    Wt 190 lb (86.2 kg)    BMI 37.11 kg/m   Body mass index is 37.11 kg/m.  General appearance : Well developed well nourished female. No acute distress HEENT: Eyes: no retinal hemorrhage or exudates,  Neck supple, trachea midline, no carotid bruits, no thyroidmegaly Lungs: Clear to auscultation, no rhonchi or wheezes, or rib retractions  Heart: Regular rate and rhythm, no murmurs or gallops Breast:Examined in sitting and supine position were symmetrical in appearance, no palpable masses or tenderness,  no skin retraction, no nipple inversion, no nipple discharge, no skin discoloration, no axillary or supraclavicular lymphadenopathy Abdomen: no palpable masses or tenderness, no rebound or guarding Extremities: no edema or skin discoloration or tenderness  Pelvic: Vulva: Normal             Vagina: No gross lesions or discharge  Cervix: No gross lesions or discharge  Uterus  AV, normal size, shape and consistency, non-tender and mobile  Adnexa  Without masses or tenderness  Anus: Normal   Assessment/Plan:  74 y.o. female for annual exam   1. Well female exam with routine gynecological exam Normal gyn exam in menopause.  No indication for a Pap test at this time.  Breast exam normal s/p Rt Breast lumpectomy/Radiation.  Col[place] 2013.  Health labs with Fam MD.  2. Postmenopause Well on no HRT.  No PMB.  3. Osteopenia, unspecified location Bone Density in 2021 Osteopenia per patient.  Will obtain report.  Continue Vit D supplements, Ca++ 1500 mg in nutrition and regular weight bearing physical activities.  4. Malignant neoplasm of upper-inner quadrant of right breast in female, estrogen receptor positive ([place]) Post lumpectomy/Radiation therapy/On Anastrozole.  5. Class 2 severe obesity due to excess calories with serious comorbidity and body mass index (BMI) of 37.0 to 37.9 in adult [facility]) Recommend increased physical activities  and mildly lower Caloric intake.  Princess Bruins MD, 11:44 AM 01/09/2020

## 2020-01-14 DIAGNOSIS — F39 Unspecified mood [affective] disorder: Secondary | ICD-10-CM | POA: Diagnosis not present

## 2020-01-14 DIAGNOSIS — E039 Hypothyroidism, unspecified: Secondary | ICD-10-CM | POA: Diagnosis not present

## 2020-01-14 DIAGNOSIS — Z0000 Encounter for general adult medical examination without abnormal findings: Secondary | ICD-10-CM | POA: Diagnosis not present

## 2020-01-14 DIAGNOSIS — E785 Hyperlipidemia, unspecified: Secondary | ICD-10-CM | POA: Diagnosis not present

## 2020-01-16 ENCOUNTER — Encounter: Payer: Self-pay | Admitting: Anesthesiology

## 2020-01-21 NOTE — Progress Notes (Signed)
[place]  [phone] [phone]     ID: [name] [name] DOB: 10/14/1945  MR#: [id number]  NGE#:[id number]  Patient Care Team: Janie Morning, DO as PCP - General (Family Medicine) Erroll Luna, MD as Consulting Physician (General Surgery) Eligio Angert, Virgie Dad, MD as Consulting Physician (Oncology) Gery Pray, MD as Consulting Physician (Radiation Oncology) Mauro Kaufmann, RN as Oncology Nurse Navigator Rockwell Germany, RN as Oncology Nurse Navigator Mikey Bussing, DDS as Consulting Physician (Dentistry) Lorelle Gibbs, MD (Radiology) Chauncey Cruel, MD OTHER MD:  CHIEF COMPLAINT: Estrogen receptor positive breast cancer  CURRENT TREATMENT: Anastrozole   INTERVAL HISTORY: [name] [name] returns today for follow up of her estrogen receptor positive breast cancer.   Since her last visit, she was referred back to Dr. Sondra Come on 10/24/2019 to discuss radiation therapy. She subsequently received treatment from 10/31/2019 through 11/27/2019.  She underwent renal ultrasound on 12/19/2019 showing: diffusely increased echogenicity within renal parenchyma with poorly corticomedullary differentiation, consistent with medical renal disease; no hydronephrosis; multiple simple bilateral renal cysts.  She started anastrozole December 17, 2019.  She is tolerating this well, with no hot flashes no change in vaginal dryness.  She anticipates no difficulty continuing it for 5 years.   REVIEW OF SYSTEMS: [name] [name] continues to be very anxious.  She is worried that her hemoglobin A1c is going up.  At the same time she told me that she greatly enjoyed some ice cream last night.  It is difficult of course not to commit indiscretions during the holiday season.  She has mild diarrhea.  She wonders if she should take calcium for her osteoporosis.  She is not walking at present.  She says her back hurts too much.  A detailed review of systems was otherwise stable   COVID  19 VACCINATION STATUS: fully vaccinated [employer]), most recent dose March 2021   HISTORY OF CURRENT ILLNESS: From the original intake note:  [name] [name] herself palpated a right breast lump for approximately 1 month. Of note, screening mammogram on 12/11/2018 was benign. She underwent right diagnostic mammography with tomography and right breast ultrasonography at [facility] on 07/12/2019 showing: breast density category B; 1.6 cm irregular mass in right breast at 2 o'clock.  Accordingly on 07/25/2019 she proceeded to biopsy of the right breast area in question. The pathology from this procedure ([id number]) showed: invasive and in situ mammary carcinoma, grade 2, e-cadherin positive. Prognostic indicators significant for: estrogen receptor, 100% positive with strong staining intensity and progesterone receptor, 70% positive with moderate staining intensity. Proliferation marker Ki67 at 2%. HER2 equivoal by immunohistochemistry (2+), but negative by fluorescent in situ hybridization with a signals ratio 1.53 and number per cell 2.75.  The patient's subsequent history is as detailed below.   PAST MEDICAL HISTORY: Past Medical History:  Diagnosis Date  . Anxiety    being weaned off lithium will be off on 06/23/11  . Bipolar disorder ([place])   . Cancer [facility]) 07/2019   right breast IDC with DCIS  . Diverticulosis 09/01/2011   mild, left sided  . Family history of breast cancer   . Family history of colon cancer   . Hyperlipidemia   . Hypothyroidism   . Leg cramps   . Numbness and tingling in left arm   . Osteopenia   . Stage 3 chronic kidney disease ([place])    CKD stg 3    PAST SURGICAL HISTORY: Past Surgical History:  Procedure Laterality Date  . BREAST LUMPECTOMY WITH RADIOACTIVE SEED  AND SENTINEL LYMPH NODE BIOPSY Right 08/29/2019   Procedure: RIGHT BREAST LUMPECTOMY WITH RADIOACTIVE SEED AND SENTINEL LYMPH NODE BIOPSY;  Surgeon: Erroll Luna, MD;  Location: [place];   Service: General;  Laterality: Right;  . COLONOSCOPY  06/09/06   Dr.Orr  . COLONOSCOPY  09/01/2011  . DILATATION & CURETTAGE/HYSTEROSCOPY WITH TRUECLEAR N/A 11/12/2013   Procedure: RESECTOSCOPIC POLYPECTOMY Irish Lack, D&C;  Surgeon: Terrance Mass, MD;  Location: [place] [facility];  Service: Gynecology;  Laterality: N/A;  . ENDOMETRIAL BIOPSY  09/14/2013   Dr. Uvaldo Rising    FAMILY HISTORY: Family History  Problem Relation Age of Onset  . Diabetes Mother   . Other Mother        meningioma (multiple)  . Colon cancer Father 56  . Parkinson's disease Sister 40  . Alzheimer's disease Maternal Grandfather   . Breast cancer Cousin 58       maternal cousin  . Other Daughter        tiny meningioma outside of the brain  . Ovarian cancer Neg Hx   . Colon polyps Neg Hx   The patient is a vascular as the extraction.  Her parents both died at age 42, her father from colon cancer diagnosed a year before his death.  Her mother died from complications of diabetes.  The patient has one sister, no brothers.   GYNECOLOGIC HISTORY:  No LMP recorded. Patient is postmenopausal. Menarche: 74 years old Age at first live birth: 74 years old [place] P 2 LMP unsure Contraceptive: previously used HRT: previously used for a "few years"  Hysterectomy? no BSO? 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 Husband [name] [name] is a former Education officer, environmental ([place]) and later worked for [employer].. Daughter [name] [name], age 35, works as a Government social research officer for [employer]) in [place], [place]. Son [name] [name], age 93, works for a Production assistant, radio here in [place].  The patient has 2 grandchildren.  She attends [employer]    ADVANCED DIRECTIVES: In place.   HEALTH MAINTENANCE: Social History   Tobacco Use  . Smoking status: Never Smoker  . Smokeless tobacco:  Never Used  Vaping Use  . Vaping Use: Never used  Substance Use Topics  . Alcohol use: No    Alcohol/week: 0.0 standard drinks  . Drug use: No     Colonoscopy: 08/2011  PAP: 2019  Bone density: 01/2019, osteopenia   No Known Allergies  Current Outpatient Medications  Medication Sig Dispense Refill  . anastrozole (ARIMIDEX) 1 MG tablet Take 1 tablet (1 mg total) by mouth daily. 30 tablet 3  . b complex vitamins tablet Take 1 tablet by mouth daily.    . Biotin 5000 MCG TABS Take 1 tablet by mouth daily.    . carbamazepine (TEGRETOL XR) 200 MG 12 hr tablet Take 400 mg by mouth at bedtime.     . Cholecalciferol (VITAMIN D-3) 1000 UNITS CAPS Take 1 capsule by mouth daily.    . Coenzyme Q10 (CO Q 10 PO) Take 300 mg by mouth daily.    . Cyanocobalamin (B-12) 5000 MCG SUBL Place 1 tablet under the tongue every morning.    . furosemide (LASIX) 40 MG tablet Take 40 mg by mouth.    . Magnesium 400 MG TABS Take 1 tablet by mouth daily.    . Multiple Vitamins-Minerals (  MULTIVITAMIN WITH MINERALS) tablet Take 1 tablet by mouth daily.    . Omega-3 Fatty Acids (FISH OIL) 1200 MG CAPS Take 3 capsules by mouth 2 (two) times daily.     . potassium chloride (KLOR-CON) 10 MEQ tablet Take 10 mEq by mouth 2 (two) times daily.    . simvastatin (ZOCOR) 20 MG tablet Take 20 mg by mouth daily.    [name] SYNTHROID 88 MCG tablet Take 1 tablet by mouth Daily.     No current facility-administered medications for this visit.    OBJECTIVE: White woman who appears stated age  60:   01/22/20 1108  BP: (!) 143/62  Pulse: 81  Resp: 18  Temp: (!) 97.5 F (36.4 C)  SpO2: 100%     Body mass index is 37.77 kg/m.   Wt Readings from Last 3 Encounters:  01/22/20 193 lb 6.4 oz (87.7 kg)  01/09/20 190 lb (86.2 kg)  12/31/19 194 lb 12.8 oz (88.4 kg)      ECOG FS:1 - Symptomatic but completely ambulatory  Sclerae unicteric, EOMs intact Wearing a mask No cervical or supraclavicular adenopathy Lungs no rales  or rhonchi Heart regular rate and rhythm Abd soft, nontender, positive bowel sounds MSK no focal spinal tenderness, no upper extremity lymphedema Neuro: nonfocal, well oriented, appropriate affect Breasts: The right breast is status post lumpectomy and radiation.  There is minimal hyperpigmentation.  The cosmetic result is good.  There is no evidence of residual or recurrent disease.  Left breast is benign.  Both axillae are benign.  LAB RESULTS:  CMP     Component Value Date/Time   NA 142 08/24/2019 1130   K 3.7 08/24/2019 1130   CL 106 08/24/2019 1130   CO2 25 08/24/2019 1130   GLUCOSE 115 (H) 08/24/2019 1130   BUN 20 08/24/2019 1130   CREATININE 1.38 (H) 08/24/2019 1130   CREATININE 1.36 (H) 08/01/2019 1215   CALCIUM 9.0 08/24/2019 1130   PROT 7.1 08/24/2019 1130   ALBUMIN 3.6 08/24/2019 1130   AST 16 08/24/2019 1130   AST 17 08/01/2019 1215   ALT 17 08/24/2019 1130   ALT 17 08/01/2019 1215   ALKPHOS 82 08/24/2019 1130   BILITOT 0.9 08/24/2019 1130   BILITOT 0.3 08/01/2019 1215   GFRNONAA 38 (L) 08/24/2019 1130   GFRNONAA 38 (L) 08/01/2019 1215   GFRAA 44 (L) 08/24/2019 1130   GFRAA 44 (L) 08/01/2019 1215    No results found for: TOTALPROTELP, ALBUMINELP, A1GS, A2GS, BETS, BETA2SER, GAMS, MSPIKE, SPEI  Lab Results  Component Value Date   WBC 8.2 08/24/2019   NEUTROABS 4.8 08/24/2019   HGB 12.3 08/24/2019   HCT 37.9 08/24/2019   MCV 95.7 08/24/2019   PLT 216 08/24/2019    No results found for: LABCA2  No components found for: UKGURK270  No results for input(s): INR in the last 168 hours.  No results found for: LABCA2  No results found for: WCB762  No results found for: GBT517  No results found for: OHY073  No results found for: CA2729  No components found for: HGQUANT  No results found for: CEA1 / No results found for: CEA1   No results found for: AFPTUMOR  No results found for: CHROMOGRNA  No results found for: KPAFRELGTCHN, LAMBDASER,  KAPLAMBRATIO (kappa/lambda light chains)  No results found for: HGBA, HGBA2QUANT, HGBFQUANT, HGBSQUAN (Hemoglobinopathy evaluation)   No results found for: LDH  No results found for: IRON, TIBC, IRONPCTSAT (Iron and TIBC)  No results found  for: FERRITIN  Urinalysis    Component Value Date/Time   COLORURINE YELLOW 11/09/2013 [place] 11/09/2013 1143   LABSPEC <1.005 (L) 11/09/2013 1143   PHURINE 7.0 11/09/2013 1143   GLUCOSEU NEGATIVE 11/09/2013 1143   HGBUR NEGATIVE 11/09/2013 1143   BILIRUBINUR NEGATIVE 11/09/2013 1143   KETONESUR NEGATIVE 11/09/2013 1143   PROTEINUR NEGATIVE 11/09/2013 1143   UROBILINOGEN 0.2 11/09/2013 1143   NITRITE NEGATIVE 11/09/2013 1143   LEUKOCYTESUR NEGATIVE 11/09/2013 1143    STUDIES: No results found.   ELIGIBLE FOR AVAILABLE RESEARCH PROTOCOL: AET  ASSESSMENT: 74 y.o. [place] woman status post right breast upper inner quadrant biopsy 07/25/2019 for a clinical T1c N0, stage IA invasive ductal carcinoma, grade 2, estrogen and progesterone receptor positive, HER-2 not amplified, with an MIB-1 of 2%.  (1) status post right lumpectomy and sentinel lymph node sampling 08/29/2019 for a pT1c pN0, stage IA invasive ductal carcinoma, grade 2, with negative margins.  (a) a total of 4 right axillary lymph nodes were removed  (2) genetics testing 08/07/2019 through the  [facility] Common Hereditary Cancers Panel found no deleterious mutations in APC, ATM, AXIN2, BARD1, BMPR1A, BRCA1, BRCA2, BRIP1, CDH1, CDK4, CDKN2A (p14ARF), CDKN2A (p16INK4a), CHEK2, CTNNA1, DICER1, EPCAM (Deletion/duplication testing only), GREM1 (promoter region deletion/duplication testing only), KIT, MEN1, MLH1, MSH2, MSH3, MSH6, MUTYH, NBN, NF1, NHTL1, PALB2, PDGFRA, PMS2, POLD1, POLE, PTEN, RAD50, RAD51C, RAD51D, RNF43, SDHB, SDHC, SDHD, SMAD4, SMARCA4. STK11, TP53, TSC1, TSC2, and VHL.  The following genes were evaluated for sequence changes only: SDHA and HOXB13  c.251G>A variant only.   (3) Oncotype score of 13 predicts a risk of recurrence outside the breast in the next 9 years of 4% if the patient's only systemic therapy is antiestrogens.  It also predicts no significant benefit from chemotherapy  (4) adjuvant radiation pending Radiation Treatment Dates: 10/31/2019 through 11/27/2019 Site Technique Total Dose (Gy) Dose per Fx (Gy) Completed Fx Beam Energies  Breast, Right: Breast_Rt 3D 40.05/40.05 2.67 15/15 6X, 10X, 15X  Breast, Right: Breast_Rt_Bst 3D 10/10 2 5/5 6X, 15X    (5) started anastrozole 12/17/2019  (a) bone density 01/16/2020 showed osteoporosis with a T score of -3.7 at the left upper femoral neck   PLAN: Timisha is now half a year out from definitive surgery for her breast cancer.  She has recovered well from her surgery and radiation.  She is tolerating anastrozole well and the plan will be to continue that for a total of 5 years.  She does have osteoporosis.  She has taken Fosamax in the past and tolerated that well.  We discussed going back on that at this point or perhaps doing Boniva or a different bone building agent.  At this point she is not sure she wants to try any of those things but she will let me know if she wishes to proceed and she also will discuss it with her primary care physician.  We talked about diet and exercise issues related to her diabetes.  Tentatively I have scheduled her to see me late June.  She knows to call for any other issue that may develop before then.  Total encounter time 25 minutes.Sarajane Jews C. Tanaia Hawkey, MD 01/22/2020 2:03 PM Medical Oncology and Hematology [facility] [place], [place] [address] Tel. [phone]    Fax. [phone]   This document serves as a record of services personally performed by Lurline Del, MD. It was created on his behalf by Wilburn Mylar,  a trained medical scribe. The creation of this record is based on the scribe's  personal observations and the provider's statements to them.   I, Lurline Del MD, have reviewed the above documentation for accuracy and completeness, and I agree with the above.   *Total Encounter Time as defined by the Centers for Medicare and Medicaid Services includes, in addition to the face-to-face time of a patient visit (documented in the note above) non-face-to-face time: obtaining and reviewing outside history, ordering and reviewing medications, tests or procedures, care coordination (communications with other health care professionals or caregivers) and documentation in the medical record.

## 2020-01-22 ENCOUNTER — Inpatient Hospital Stay: Payer: Medicare Other | Attending: Oncology | Admitting: Oncology

## 2020-01-22 ENCOUNTER — Other Ambulatory Visit: Payer: Self-pay

## 2020-01-22 VITALS — BP 143/62 | HR 81 | Temp 97.5°F | Resp 18 | Ht 60.0 in | Wt 193.4 lb

## 2020-01-22 DIAGNOSIS — Z170 Estrogen receptor positive status [ER+]: Secondary | ICD-10-CM

## 2020-01-22 DIAGNOSIS — Z803 Family history of malignant neoplasm of breast: Secondary | ICD-10-CM | POA: Insufficient documentation

## 2020-01-22 DIAGNOSIS — M8580 Other specified disorders of bone density and structure, unspecified site: Secondary | ICD-10-CM | POA: Diagnosis not present

## 2020-01-22 DIAGNOSIS — Z79811 Long term (current) use of aromatase inhibitors: Secondary | ICD-10-CM | POA: Insufficient documentation

## 2020-01-22 DIAGNOSIS — Z800 Family history of malignant neoplasm of digestive organs: Secondary | ICD-10-CM | POA: Insufficient documentation

## 2020-01-22 DIAGNOSIS — C50211 Malignant neoplasm of upper-inner quadrant of right female breast: Secondary | ICD-10-CM | POA: Diagnosis not present

## 2020-03-04 ENCOUNTER — Other Ambulatory Visit: Payer: Self-pay | Admitting: Oncology

## 2020-03-10 ENCOUNTER — Ambulatory Visit: Payer: Medicare Other

## 2020-03-12 ENCOUNTER — Telehealth: Payer: Self-pay | Admitting: *Deleted

## 2020-03-12 NOTE — Telephone Encounter (Signed)
Received vm call from pt stating that she is having some itching for about a week on inner thighs, inner arms, & buttocks.  She is scratching.  She is using cortisone cream but hasn't helped.  She wants to know if this could be related to decreased estrogen from being on anastrozole.   Called pt & she has been on anastrozole since Nov 2021.  She made an appt with her dermatologist for tomorrow.  She reports some hives.  She denies any new meds/clothing, topicals, foods, etc.  She would also like some thoughts from Dr Jana Hakim. She has not tried any benadryl.  Message to Dr Jana Hakim.

## 2020-03-13 DIAGNOSIS — L304 Erythema intertrigo: Secondary | ICD-10-CM | POA: Diagnosis not present

## 2020-03-13 DIAGNOSIS — L503 Dermatographic urticaria: Secondary | ICD-10-CM | POA: Diagnosis not present

## 2020-03-13 DIAGNOSIS — L2084 Intrinsic (allergic) eczema: Secondary | ICD-10-CM | POA: Diagnosis not present

## 2020-03-14 ENCOUNTER — Telehealth: Payer: Self-pay | Admitting: *Deleted

## 2020-03-14 NOTE — Telephone Encounter (Signed)
Pt left message wanting to follow up on call from 1/26 per onset of rash and itching. She saw her dermatologist and was told it was eczema due to severe dryness. She was told due to the anastrozole and the simvastatin - she has increased dryness. She has been prescribed creams for use and has noted some improvement.  No other needs at this time.

## 2020-03-17 ENCOUNTER — Ambulatory Visit: Payer: Medicare Other

## 2020-03-17 DIAGNOSIS — L299 Pruritus, unspecified: Secondary | ICD-10-CM | POA: Diagnosis not present

## 2020-03-17 DIAGNOSIS — L309 Dermatitis, unspecified: Secondary | ICD-10-CM | POA: Diagnosis not present

## 2020-03-20 DIAGNOSIS — H52223 Regular astigmatism, bilateral: Secondary | ICD-10-CM | POA: Diagnosis not present

## 2020-03-31 ENCOUNTER — Ambulatory Visit: Payer: Medicare Other | Attending: Surgery

## 2020-03-31 DIAGNOSIS — Z483 Aftercare following surgery for neoplasm: Secondary | ICD-10-CM | POA: Insufficient documentation

## 2020-04-01 ENCOUNTER — Other Ambulatory Visit: Payer: Self-pay | Admitting: Physician Assistant

## 2020-04-01 DIAGNOSIS — L308 Other specified dermatitis: Secondary | ICD-10-CM | POA: Diagnosis not present

## 2020-04-01 DIAGNOSIS — D485 Neoplasm of uncertain behavior of skin: Secondary | ICD-10-CM | POA: Diagnosis not present

## 2020-04-02 ENCOUNTER — Telehealth: Payer: Self-pay

## 2020-04-02 NOTE — Telephone Encounter (Signed)
Left message for pt to verify I will let Dr Jana Hakim know that she will restart her anastrozole on 04/07/20 due to her wanting to finish her current course of prednisone. The anastrozole was held due to pt's  rash and increased itching. Pt stated in her message that her symptoms were much better and once her prednisone was finished she would be restarting the medication (04/07/20).

## 2020-04-07 ENCOUNTER — Other Ambulatory Visit: Payer: Self-pay

## 2020-04-07 ENCOUNTER — Ambulatory Visit: Payer: Medicare Other

## 2020-04-07 DIAGNOSIS — Z483 Aftercare following surgery for neoplasm: Secondary | ICD-10-CM

## 2020-04-07 NOTE — Therapy (Signed)
[place], [place], [address] Phone: [phone]   Fax:  [phone]  Physical Therapy Treatment  Patient Details  Name: [name] [name] MRN: [id number] Date of Birth: May 11, 1945 Referring Provider (PT): Dr. Erroll Luna   Encounter Date: 04/07/2020   PT End of Session - 04/07/20 1034    Visit Number 2   # unchanged due to screen only   Number of Visits 2    Date for PT Re-Evaluation 09/26/19    PT Start Time 1022    PT Stop Time 1034    PT Time Calculation (min) 12 min    Activity Tolerance Patient tolerated treatment well    Behavior During Therapy [facility] for tasks assessed/performed           Past Medical History:  Diagnosis Date  . Anxiety    being weaned off lithium will be off on 06/23/11  . Bipolar disorder ([place])   . Cancer [facility]) 07/2019   right breast IDC with DCIS  . Diverticulosis 09/01/2011   mild, left sided  . Family history of breast cancer   . Family history of colon cancer   . Hyperlipidemia   . Hypothyroidism   . Leg cramps   . Numbness and tingling in left arm   . Osteopenia   . Stage 3 chronic kidney disease ([place])    CKD stg 3    Past Surgical History:  Procedure Laterality Date  . BREAST LUMPECTOMY WITH RADIOACTIVE SEED AND SENTINEL LYMPH NODE BIOPSY Right 08/29/2019   Procedure: RIGHT BREAST LUMPECTOMY WITH RADIOACTIVE SEED AND SENTINEL LYMPH NODE BIOPSY;  Surgeon: Erroll Luna, MD;  Location: [place];  Service: General;  Laterality: Right;  . COLONOSCOPY  06/09/06   Dr.Orr  . COLONOSCOPY  09/01/2011  . DILATATION & CURETTAGE/HYSTEROSCOPY WITH TRUECLEAR N/A 11/12/2013   Procedure: RESECTOSCOPIC POLYPECTOMY Irish Lack, D&C;  Surgeon: Terrance Mass, MD;  Location: [facility] [facility];  Service: Gynecology;  Laterality: N/A;  . ENDOMETRIAL BIOPSY  09/14/2013   Dr. Uvaldo Rising    There were no vitals filed for this visit.   Subjective Assessment - 04/07/20  1030    Subjective Pt retrurns for 3 month L-Dex screen    Pertinent History Patient was diagnosed on 07/12/2019 with right grade II invasive ductal carcinoma breast cancer. Patient reports she underwent a right lumpectomy and sentinel node biopsy (4 negative nodes) on 08/29/2019. It is ER/PR positive and HER2 negative with a Ki67 of 2%.                  L-DEX FLOWSHEETS - 04/07/20 1000      L-DEX LYMPHEDEMA SCREENING   Measurement Type Unilateral    L-DEX MEASUREMENT EXTREMITY Upper Extremity    POSITION  Standing    DOMINANT SIDE Right    At Risk Side Right    BASELINE SCORE (UNILATERAL) -4.2    L-DEX SCORE (UNILATERAL) -4.1    VALUE CHANGE (UNILAT) 0.1                                  PT Long Term Goals - 09/24/19 1613      PT LONG TERM GOAL #1   Title Patient will demonstrate she has regained full shoulder ROM and function post operatively compared to baselines.    Time 8    Period Weeks    Status Achieved  Plan - 04/07/20 1034    Clinical Impression Statement Pt returns for her 3 month L-Dex screen. Her change from baseline of 0.1 is WNLs so no further treatment is required at this time except to cont every 3 month L-Dex screens which pt is agreeable to.    PT Next Visit Plan f/u every 3 months for L-Dex screenings    Consulted and Agree with Plan of Care Patient           Patient will benefit from skilled therapeutic intervention in order to improve the following deficits and impairments:     Visit Diagnosis: Aftercare following surgery for neoplasm     Problem List Patient Active Problem List   Diagnosis Date Noted  . Genetic testing 08/10/2019  . Family history of breast cancer   . Family history of colon cancer   . Malignant neoplasm of upper-inner quadrant of right breast in female, estrogen receptor positive ([place]) 07/30/2019  . Osteopenia 09/10/2016  . Vaginal atrophy 09/10/2015  . Menopause  09/10/2015    [name] [name], PTA 04/07/2020, 10:38 AM  [place], [place], [address] Phone: [phone]   Fax:  [phone]  Name: [name] [name] MRN: [id number] Date of Birth: Jun 24, 1945

## 2020-04-09 DIAGNOSIS — R21 Rash and other nonspecific skin eruption: Secondary | ICD-10-CM | POA: Diagnosis not present

## 2020-04-11 ENCOUNTER — Telehealth: Payer: Self-pay

## 2020-04-11 NOTE — Telephone Encounter (Signed)
Returned call to pt . Pt stated she had a Dermatologist appointment and blood work done due to increased itching over the last month (Dr Jana Hakim aware) Dermatology determined that a medication had caused the itching. Either lasix or Anastrozole. Pt has reached out to PCP about changing Lasix prescription. Pt has been holding anastrozole since 03/28/20 per MD direction, but was told to restart on 04/07/20. Pt wants to continue to hold anastrozole until she sees PCP on 04/24/20. Will report to MD for further directive.

## 2020-04-17 DIAGNOSIS — R7303 Prediabetes: Secondary | ICD-10-CM | POA: Diagnosis not present

## 2020-04-17 DIAGNOSIS — N183 Chronic kidney disease, stage 3 unspecified: Secondary | ICD-10-CM | POA: Diagnosis not present

## 2020-04-17 DIAGNOSIS — E039 Hypothyroidism, unspecified: Secondary | ICD-10-CM | POA: Diagnosis not present

## 2020-04-17 DIAGNOSIS — Z5181 Encounter for therapeutic drug level monitoring: Secondary | ICD-10-CM | POA: Diagnosis not present

## 2020-04-17 DIAGNOSIS — E785 Hyperlipidemia, unspecified: Secondary | ICD-10-CM | POA: Diagnosis not present

## 2020-04-24 DIAGNOSIS — R7303 Prediabetes: Secondary | ICD-10-CM | POA: Diagnosis not present

## 2020-04-24 DIAGNOSIS — E785 Hyperlipidemia, unspecified: Secondary | ICD-10-CM | POA: Diagnosis not present

## 2020-04-24 DIAGNOSIS — N183 Chronic kidney disease, stage 3 unspecified: Secondary | ICD-10-CM | POA: Diagnosis not present

## 2020-04-24 DIAGNOSIS — Z5181 Encounter for therapeutic drug level monitoring: Secondary | ICD-10-CM | POA: Diagnosis not present

## 2020-06-20 DIAGNOSIS — H2513 Age-related nuclear cataract, bilateral: Secondary | ICD-10-CM | POA: Diagnosis not present

## 2020-06-20 DIAGNOSIS — H04123 Dry eye syndrome of bilateral lacrimal glands: Secondary | ICD-10-CM | POA: Diagnosis not present

## 2020-07-07 ENCOUNTER — Ambulatory Visit: Payer: Medicare Other

## 2020-07-19 ENCOUNTER — Other Ambulatory Visit: Payer: Self-pay | Admitting: Oncology

## 2020-07-28 DIAGNOSIS — E559 Vitamin D deficiency, unspecified: Secondary | ICD-10-CM | POA: Diagnosis not present

## 2020-07-28 DIAGNOSIS — R7303 Prediabetes: Secondary | ICD-10-CM | POA: Diagnosis not present

## 2020-07-28 DIAGNOSIS — E785 Hyperlipidemia, unspecified: Secondary | ICD-10-CM | POA: Diagnosis not present

## 2020-07-28 DIAGNOSIS — E039 Hypothyroidism, unspecified: Secondary | ICD-10-CM | POA: Diagnosis not present

## 2020-08-04 DIAGNOSIS — R7303 Prediabetes: Secondary | ICD-10-CM | POA: Diagnosis not present

## 2020-08-04 DIAGNOSIS — E039 Hypothyroidism, unspecified: Secondary | ICD-10-CM | POA: Diagnosis not present

## 2020-08-04 DIAGNOSIS — Z5181 Encounter for therapeutic drug level monitoring: Secondary | ICD-10-CM | POA: Diagnosis not present

## 2020-08-04 DIAGNOSIS — E785 Hyperlipidemia, unspecified: Secondary | ICD-10-CM | POA: Diagnosis not present

## 2020-08-05 NOTE — Progress Notes (Signed)
[name] [name]  Telephone:([phone]) [phone] Fax:([phone]) [phone]     ID: [name] [name] DOB: Jul 14, 1945  MR#: [id number]  FOY#:[id number]  Patient Care Team: Janie Morning, DO as PCP - General (Family Medicine) Erroll Luna, MD as Consulting Physician (General Surgery) Lavaughn Haberle, Virgie Dad, MD as Consulting Physician (Oncology) Gery Pray, MD as Consulting Physician (Radiation Oncology) Mauro Kaufmann, RN as Oncology Nurse Navigator Rockwell Germany, RN as Oncology Nurse Navigator Mikey Bussing, DDS as Consulting Physician (Dentistry) Lorelle Gibbs, MD (Radiology) Chauncey Cruel, MD OTHER MD:  CHIEF COMPLAINT: Estrogen receptor positive breast cancer  CURRENT TREATMENT: Anastrozole   INTERVAL HISTORY: [name] [name] returns today for follow up of her estrogen receptor positive breast cancer.   She started anastrozole December 17, 2019.  She is tolerating this well, with no hot flashes no change in vaginal dryness.  She obtains it at a very good price  Her most recent bone density screening from 01/25/2019 showed a T-score of -2.8, which is considered osteoporotic.  She was scheduled for mammography at [facility] in June but had to postpone it because she was not feeling well that day.  She is now rescheduled for August   REVIEW OF SYSTEMS: [name] [name] is very concerned because she says her husband has early Alzheimer's.  He does know who she is and he generally continues to be quite active.  She saw her primary care doctor who told her her abdomen was a little terse and [name] [name] now is concerned that there may be something wrong with her abdomen and wonders if she should see a gastroenterologist.  She has noted a little bit of blood in her stool when she wipes.  She does have a history of hemorrhoids she tells me.  She continues to be itchy and has a little bit of a rash in the lower abdomen area.  She still has some of the creams that her dermatologist gave her.  She is not  exercising regularly.  Aside from these issues a detailed review of systems today was stable  COVID 19 VACCINATION STATUS: fully vaccinated [employer]), most recent dose March 2021   HISTORY OF CURRENT ILLNESS: From the original intake note:  [name] [name] herself palpated a right breast lump for approximately 1 month. Of note, screening mammogram on 12/11/2018 was benign. She underwent right diagnostic mammography with tomography and right breast ultrasonography at [facility] on 07/12/2019 showing: breast density category B; 1.6 cm irregular mass in right breast at 2 o'clock.  Accordingly on 07/25/2019 she proceeded to biopsy of the right breast area in question. The pathology from this procedure ([id number]) showed: invasive and in situ mammary carcinoma, grade 2, e-cadherin positive. Prognostic indicators significant for: estrogen receptor, 100% positive with strong staining intensity and progesterone receptor, 70% positive with moderate staining intensity. Proliferation marker Ki67 at 2%. HER2 equivoal by immunohistochemistry (2+), but negative by fluorescent in situ hybridization with a signals ratio 1.53 and number per cell 2.75.  The patient's subsequent history is as detailed below.   PAST MEDICAL HISTORY: Past Medical History:  Diagnosis Date   Anxiety    being weaned off lithium will be off on 06/23/11   Bipolar disorder ([place])    Cancer ([place]) 07/2019   right breast IDC with DCIS   Diverticulosis 09/01/2011   mild, left sided   Family history of breast cancer    Family history of colon cancer    Hyperlipidemia    Hypothyroidism    Leg cramps    Numbness  and tingling in left arm    Osteopenia    Stage 3 chronic kidney disease ([place])    CKD stg 3    PAST SURGICAL HISTORY: Past Surgical History:  Procedure Laterality Date   BREAST LUMPECTOMY WITH RADIOACTIVE SEED AND SENTINEL LYMPH NODE BIOPSY Right 08/29/2019   Procedure: RIGHT BREAST LUMPECTOMY WITH RADIOACTIVE SEED AND SENTINEL  LYMPH NODE BIOPSY;  Surgeon: Erroll Luna, MD;  Location: [facility];  Service: General;  Laterality: Right;   COLONOSCOPY  06/09/06   Dr.Orr   COLONOSCOPY  09/01/2011   DILATATION & CURETTAGE/HYSTEROSCOPY WITH TRUECLEAR N/A 11/12/2013   Procedure: RESECTOSCOPIC POLYPECTOMY Irish Lack, D&C;  Surgeon: Terrance Mass, MD;  Location: [facility];  Service: Gynecology;  Laterality: N/A;   ENDOMETRIAL BIOPSY  09/14/2013   Dr. Uvaldo Rising    FAMILY HISTORY: Family History  Problem Relation Age of Onset   Diabetes Mother    Other Mother        meningioma (multiple)   Colon cancer Father 59   Parkinson's disease Sister 14   Alzheimer's disease Maternal Grandfather    Breast cancer Cousin 25       maternal cousin   Other Daughter        tiny meningioma outside of the brain   Ovarian cancer Neg Hx    Colon polyps Neg Hx   The patient is a vascular as the extraction.  Her parents both died at age 50, her father from colon cancer diagnosed a year before his death.  Her mother died from complications of diabetes.  The patient has one sister, no brothers.    GYNECOLOGIC HISTORY:  No LMP recorded. Patient is postmenopausal. Menarche: 75 years old Age at first live birth: 75 years old [place] P 2 LMP unsure Contraceptive: previously used HRT: previously used for a "few years"  Hysterectomy? no BSO? 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 Husband [name] [name] is a former Education officer, environmental ([place]) and later worked for [employer].. Daughter [name] [name], age 41, works as a Government social research officer for [employer]) in [place], [place]. Son [name] [name], age 73, works for a Production assistant, radio here in [place].  The patient has 2 grandchildren.  She attends [employer]    ADVANCED DIRECTIVES: In place.   HEALTH MAINTENANCE: Social  History   Tobacco Use   Smoking status: Never   Smokeless tobacco: Never  Vaping Use   Vaping Use: Never used  Substance Use Topics   Alcohol use: No    Alcohol/week: 0.0 standard drinks   Drug use: No     Colonoscopy: 08/2011  PAP: 2019  Bone density: 01/2019, osteopenia   No Known Allergies  Current Outpatient Medications  Medication Sig Dispense Refill   anastrozole (ARIMIDEX) 1 MG tablet TAKE 1 TABLET(1 MG) BY MOUTH DAILY 90 tablet 0   b complex vitamins tablet Take 1 tablet by mouth daily.     Biotin 5000 MCG TABS Take 1 tablet by mouth daily.     carbamazepine (TEGRETOL XR) 200 MG 12 hr tablet Take 400 mg by mouth at bedtime.      Cholecalciferol (VITAMIN D-3) 1000 UNITS CAPS Take 1 capsule by mouth daily.     Coenzyme Q10 (CO Q 10 PO) Take 300 mg by mouth daily.     Cyanocobalamin (B-12) 5000 MCG SUBL Place  1 tablet under the tongue every morning.     furosemide (LASIX) 40 MG tablet Take 40 mg by mouth.     Magnesium 400 MG TABS Take 1 tablet by mouth daily.     Multiple Vitamins-Minerals (MULTIVITAMIN WITH MINERALS) tablet Take 1 tablet by mouth daily.     Omega-3 Fatty Acids (FISH OIL) 1200 MG CAPS Take 3 capsules by mouth 2 (two) times daily.      potassium chloride (KLOR-CON) 10 MEQ tablet Take 10 mEq by mouth 2 (two) times daily.     simvastatin (ZOCOR) 20 MG tablet Take 20 mg by mouth daily.     SYNTHROID 88 MCG tablet Take 1 tablet by mouth Daily.     No current facility-administered medications for this visit.    OBJECTIVE: White woman who appears stated age  89:   08/06/20 1310  BP: (!) 155/63  Pulse: 98  Resp: 18  Temp: 97.9 F (36.6 C)  SpO2: 98%      Body mass index is 37.89 kg/m.   Wt Readings from Last 3 Encounters:  08/06/20 194 lb (88 kg)  01/22/20 193 lb 6.4 oz (87.7 kg)  01/09/20 190 lb (86.2 kg)      ECOG FS:1 - Symptomatic but completely ambulatory  Sclerae unicteric, EOMs intact Wearing a mask No cervical or supraclavicular  adenopathy Lungs no rales or rhonchi Heart regular rate and rhythm Abd soft, obese, nontender, no masses palpated MSK no focal spinal tenderness, no upper extremity lymphedema Neuro: nonfocal, well oriented, appropriate affect Breasts: The right breast is status post lumpectomy followed by radiation.  It is smaller than the left side, but otherwise unremarkable.  Left breast and both axillae are benign.   LAB RESULTS:  CMP     Component Value Date/Time   NA 142 08/24/2019 1130   K 3.7 08/24/2019 1130   CL 106 08/24/2019 1130   CO2 25 08/24/2019 1130   GLUCOSE 115 (H) 08/24/2019 1130   BUN 20 08/24/2019 1130   CREATININE 1.38 (H) 08/24/2019 1130   CREATININE 1.36 (H) 08/01/2019 1215   CALCIUM 9.0 08/24/2019 1130   PROT 7.1 08/24/2019 1130   ALBUMIN 3.6 08/24/2019 1130   AST 16 08/24/2019 1130   AST 17 08/01/2019 1215   ALT 17 08/24/2019 1130   ALT 17 08/01/2019 1215   ALKPHOS 82 08/24/2019 1130   BILITOT 0.9 08/24/2019 1130   BILITOT 0.3 08/01/2019 1215   GFRNONAA 38 (L) 08/24/2019 1130   GFRNONAA 38 (L) 08/01/2019 1215   GFRAA 44 (L) 08/24/2019 1130   GFRAA 44 (L) 08/01/2019 1215    No results found for: TOTALPROTELP, ALBUMINELP, A1GS, A2GS, BETS, BETA2SER, GAMS, MSPIKE, SPEI  Lab Results  Component Value Date   WBC 8.2 08/24/2019   NEUTROABS 4.8 08/24/2019   HGB 12.3 08/24/2019   HCT 37.9 08/24/2019   MCV 95.7 08/24/2019   PLT 216 08/24/2019    No results found for: LABCA2  No components found for: [id number]  No results for input(s): INR in the last 168 hours.  No results found for: LABCA2  No results found for: IWP809  No results found for: XIP382  No results found for: NKN397  No results found for: CA2729  No components found for: HGQUANT  No results found for: CEA1 / No results found for: CEA1   No results found for: AFPTUMOR  No results found for: CHROMOGRNA  No results found for: KPAFRELGTCHN, LAMBDASER, KAPLAMBRATIO (kappa/lambda light  chains)  No results  found for: HGBA, HGBA2QUANT, HGBFQUANT, HGBSQUAN (Hemoglobinopathy evaluation)   No results found for: LDH  No results found for: IRON, TIBC, IRONPCTSAT (Iron and TIBC)  No results found for: FERRITIN  Urinalysis    Component Value Date/Time   COLORURINE YELLOW 11/09/2013 [place] 11/09/2013 1143   LABSPEC <1.005 (L) 11/09/2013 1143   PHURINE 7.0 11/09/2013 1143   GLUCOSEU NEGATIVE 11/09/2013 1143   HGBUR NEGATIVE 11/09/2013 1143   BILIRUBINUR NEGATIVE 11/09/2013 1143   KETONESUR NEGATIVE 11/09/2013 1143   PROTEINUR NEGATIVE 11/09/2013 1143   UROBILINOGEN 0.2 11/09/2013 1143   NITRITE NEGATIVE 11/09/2013 1143   LEUKOCYTESUR NEGATIVE 11/09/2013 1143    STUDIES: No results found.   ELIGIBLE FOR AVAILABLE RESEARCH PROTOCOL: AET  ASSESSMENT: 75 y.o. [place] woman status post right breast upper inner quadrant biopsy 07/25/2019 for a clinical T1c N0, stage IA invasive ductal carcinoma, grade 2, estrogen and progesterone receptor positive, HER-2 not amplified, with an MIB-1 of 2%.  (1) status post right lumpectomy and sentinel lymph node sampling 08/29/2019 for a pT1c pN0, stage IA invasive ductal carcinoma, grade 2, with negative margins.  (a) a total of 4 right axillary lymph nodes were removed  (2) genetics testing 08/07/2019 through the  [facility] Common Hereditary Cancers Panel found no deleterious mutations in APC, ATM, AXIN2, BARD1, BMPR1A, BRCA1, BRCA2, BRIP1, CDH1, CDK4, CDKN2A (p14ARF), CDKN2A (p16INK4a), CHEK2, CTNNA1, DICER1, EPCAM (Deletion/duplication testing only), GREM1 (promoter region deletion/duplication testing only), KIT, MEN1, MLH1, MSH2, MSH3, MSH6, MUTYH, NBN, NF1, NHTL1, PALB2, PDGFRA, PMS2, POLD1, POLE, PTEN, RAD50, RAD51C, RAD51D, RNF43, SDHB, SDHC, SDHD, SMAD4, SMARCA4. STK11, TP53, TSC1, TSC2, and VHL.  The following genes were evaluated for sequence changes only: SDHA and HOXB13 c.251G>A variant only.   (3)  Oncotype score of 13 predicts a risk of recurrence outside the breast in the next 9 years of 4% if the patient's only systemic therapy is antiestrogens.  It also predicts no significant benefit from chemotherapy  (4) adjuvant radiation pending Radiation Treatment Dates: 10/31/2019 through 11/27/2019 Site Technique Total Dose (Gy) Dose per Fx (Gy) Completed Fx Beam Energies  Breast, Right: Breast_Rt 3D 40.05/40.05 2.67 15/15 6X, 10X, 15X  Breast, Right: Breast_Rt_Bst 3D 10/10 2 5/5 6X, 15X    (5) started anastrozole 12/17/2019  (a) bone density 01/16/2020 showed osteoporosis with a T score of -3.7 at the left upper femoral neck   PLAN: [name] [name] is now close to 1 year out from definitive surgery for her breast cancer with no evidence of disease recurrence.  This is very favorable.  She is tolerating anastrozole well and the plan will be to continue that a total of 5years.  She remains very anxious and wonders if she should see a renal doctor or if she should see a GI doctor.  I think that she should do something about her bones but she is very reluctant to do that.  She tells me she thinks she may have another bone density through Dr. Shelia Media office this year and whenever she gets the bone density she will discuss the osteoporosis further with Dr. Theda Sers.  I encouraged her to be a little bit more active and to cut back on carbohydrates  She will see [place] again in March 2023, with labs same day  Total encounter time 25 minutes.Sarajane Jews C. Faun Mcqueen, MD 08/06/2020 1:18 PM Medical Oncology and Hematology [facility] [place], [place] [address] Tel. [phone]    Fax. [phone]  This document serves as a record of services personally performed by Lurline Del, MD. It was created on his behalf by Wilburn Mylar, a trained medical scribe. The creation of this record is based on the scribe's personal observations and the provider's statements to them.    I, Lurline Del MD, have reviewed the above documentation for accuracy and completeness, and I agree with the above.   *Total Encounter Time as defined by the Centers for Medicare and Medicaid Services includes, in addition to the face-to-face time of a patient visit (documented in the note above) non-face-to-face time: obtaining and reviewing outside history, ordering and reviewing medications, tests or procedures, care coordination (communications with other health care professionals or caregivers) and documentation in the medical record.

## 2020-08-06 ENCOUNTER — Other Ambulatory Visit: Payer: Self-pay

## 2020-08-06 ENCOUNTER — Inpatient Hospital Stay: Payer: Medicare Other | Attending: Oncology | Admitting: Oncology

## 2020-08-06 VITALS — BP 155/63 | HR 98 | Temp 97.9°F | Resp 18 | Ht 60.0 in | Wt 194.0 lb

## 2020-08-06 DIAGNOSIS — Z79811 Long term (current) use of aromatase inhibitors: Secondary | ICD-10-CM | POA: Diagnosis not present

## 2020-08-06 DIAGNOSIS — Z800 Family history of malignant neoplasm of digestive organs: Secondary | ICD-10-CM | POA: Insufficient documentation

## 2020-08-06 DIAGNOSIS — Z803 Family history of malignant neoplasm of breast: Secondary | ICD-10-CM | POA: Insufficient documentation

## 2020-08-06 DIAGNOSIS — C50211 Malignant neoplasm of upper-inner quadrant of right female breast: Secondary | ICD-10-CM | POA: Diagnosis not present

## 2020-08-06 DIAGNOSIS — Z170 Estrogen receptor positive status [ER+]: Secondary | ICD-10-CM

## 2020-08-06 DIAGNOSIS — M8580 Other specified disorders of bone density and structure, unspecified site: Secondary | ICD-10-CM | POA: Insufficient documentation

## 2020-08-06 MED ORDER — ANASTROZOLE 1 MG PO TABS: 1.0000 mg | ORAL_TABLET | Freq: Every day | ORAL | 4 refills | Status: DC

## 2020-08-11 ENCOUNTER — Other Ambulatory Visit: Payer: Self-pay

## 2020-08-11 ENCOUNTER — Ambulatory Visit: Payer: Medicare Other | Attending: Oncology

## 2020-08-11 DIAGNOSIS — Z483 Aftercare following surgery for neoplasm: Secondary | ICD-10-CM | POA: Insufficient documentation

## 2020-08-11 NOTE — Therapy (Signed)
[place], [place], [address] Phone: [phone]   Fax:  [phone]  Physical Therapy Treatment  Patient Details  Name: [name] [name] MRN: [id number] Date of Birth: [birth year] Referring Provider (PT): Dr. Erroll Luna   Encounter Date: 08/11/2020   PT End of Session - 08/11/20 1410     Visit Number 2   # unchanged due to screen only   PT Start Time 6116    PT Stop Time 1411    PT Time Calculation (min) 8 min    Activity Tolerance Patient tolerated treatment well    Behavior During Therapy [facility] for tasks assessed/performed             Past Medical History:  Diagnosis Date   Anxiety    being weaned off lithium will be off on 06/23/11   Bipolar disorder ([place])    Cancer ([place]) 07/2019   right breast IDC with DCIS   Diverticulosis 09/01/2011   mild, left sided   Family history of breast cancer    Family history of colon cancer    Hyperlipidemia    Hypothyroidism    Leg cramps    Numbness and tingling in left arm    Osteopenia    Stage 3 chronic kidney disease ([place])    CKD stg 3    Past Surgical History:  Procedure Laterality Date   BREAST LUMPECTOMY WITH RADIOACTIVE SEED AND SENTINEL LYMPH NODE BIOPSY Right 08/29/2019   Procedure: RIGHT BREAST LUMPECTOMY WITH RADIOACTIVE SEED AND SENTINEL LYMPH NODE BIOPSY;  Surgeon: Erroll Luna, MD;  Location: [facility];  Service: General;  Laterality: Right;   COLONOSCOPY  06/09/06   Dr.Orr   COLONOSCOPY  09/01/2011   DILATATION & CURETTAGE/HYSTEROSCOPY WITH TRUECLEAR N/A 11/12/2013   Procedure: RESECTOSCOPIC POLYPECTOMY Irish Lack, D&C;  Surgeon: Terrance Mass, MD;  Location: [facility];  Service: Gynecology;  Laterality: N/A;   ENDOMETRIAL BIOPSY  09/14/2013   Dr. Uvaldo Rising    There were no vitals filed for this visit.   Subjective Assessment - 08/11/20 1406     Subjective Pt returns for 3 month L-Dex screen    Pertinent  History Patient was diagnosed on 07/12/2019 with right grade II invasive ductal carcinoma breast cancer. Patient reports she underwent a right lumpectomy and sentinel node biopsy (4 negative nodes) on 08/29/2019. It is ER/PR positive and HER2 negative with a Ki67 of 2%.                    L-DEX FLOWSHEETS - 08/11/20 1400       L-DEX LYMPHEDEMA SCREENING   Measurement Type Unilateral    L-DEX MEASUREMENT EXTREMITY Upper Extremity    POSITION  Standing    DOMINANT SIDE Right    At Risk Side Right    BASELINE SCORE (UNILATERAL) -4.2    L-DEX SCORE (UNILATERAL) -2.7    VALUE CHANGE (UNILAT) 1.5                                    PT Long Term Goals - 09/24/19 1613       PT LONG TERM GOAL #1   Title Patient will demonstrate she has regained full shoulder ROM and function post operatively compared to baselines.    Time 8    Period Weeks    Status Achieved  Plan - 08/11/20 1411     Clinical Impression Statement Pt returns for her 3 month L-Dex screen. Her change from baseline of 1.5 is WNLs so no further treatment is required at this time except to cont every 3 month L-Dex screens which pt is agreeable to.    PT Next Visit Plan Cont every 3 month for L-Dex screens for up to 2 years from her SLNB.    Consulted and Agree with Plan of Care Patient             Patient will benefit from skilled therapeutic intervention in order to improve the following deficits and impairments:     Visit Diagnosis: Aftercare following surgery for neoplasm     Problem List Patient Active Problem List   Diagnosis Date Noted   Genetic testing 08/10/2019   Family history of breast cancer    Family history of colon cancer    Malignant neoplasm of upper-inner quadrant of right breast in female, estrogen receptor positive ([place]) 07/30/2019   Osteopenia 09/10/2016   Vaginal atrophy 09/10/2015   Menopause 09/10/2015    [name] [name], PTA 08/11/2020, 2:15 PM  [place], [place], [address] Phone: [phone]   Fax:  [phone]  Name: [name] [name] MRN: [id number] Date of Birth: 1945/05/10

## 2020-08-13 ENCOUNTER — Telehealth: Payer: Self-pay | Admitting: Oncology

## 2020-08-13 ENCOUNTER — Telehealth: Payer: Self-pay | Admitting: Hematology and Oncology

## 2020-08-13 NOTE — Telephone Encounter (Signed)
Sch per 06/23 los, pt aware.

## 2020-08-13 NOTE — Telephone Encounter (Signed)
Sch per 06/23, pt is aware.

## 2020-08-14 ENCOUNTER — Telehealth: Payer: Self-pay | Admitting: *Deleted

## 2020-08-14 NOTE — Telephone Encounter (Signed)
This RN attempted to return requested call ( she had spoken with scheduling ) - received VMs on both home and cell number.  This RN requested pt to call again per her questions

## 2020-08-25 ENCOUNTER — Encounter (HOSPITAL_COMMUNITY): Payer: Self-pay

## 2020-09-10 ENCOUNTER — Encounter: Payer: Self-pay | Admitting: Obstetrics & Gynecology

## 2020-09-10 DIAGNOSIS — Z853 Personal history of malignant neoplasm of breast: Secondary | ICD-10-CM | POA: Diagnosis not present

## 2020-11-04 DIAGNOSIS — R7303 Prediabetes: Secondary | ICD-10-CM | POA: Diagnosis not present

## 2020-11-04 DIAGNOSIS — I129 Hypertensive chronic kidney disease with stage 1 through stage 4 chronic kidney disease, or unspecified chronic kidney disease: Secondary | ICD-10-CM | POA: Diagnosis not present

## 2020-11-04 DIAGNOSIS — E785 Hyperlipidemia, unspecified: Secondary | ICD-10-CM | POA: Diagnosis not present

## 2020-11-04 DIAGNOSIS — E559 Vitamin D deficiency, unspecified: Secondary | ICD-10-CM | POA: Diagnosis not present

## 2020-11-04 DIAGNOSIS — E039 Hypothyroidism, unspecified: Secondary | ICD-10-CM | POA: Diagnosis not present

## 2020-11-06 DIAGNOSIS — I5030 Unspecified diastolic (congestive) heart failure: Secondary | ICD-10-CM | POA: Diagnosis not present

## 2020-11-11 DIAGNOSIS — E785 Hyperlipidemia, unspecified: Secondary | ICD-10-CM | POA: Diagnosis not present

## 2020-11-11 DIAGNOSIS — E039 Hypothyroidism, unspecified: Secondary | ICD-10-CM | POA: Diagnosis not present

## 2020-11-11 DIAGNOSIS — E559 Vitamin D deficiency, unspecified: Secondary | ICD-10-CM | POA: Diagnosis not present

## 2020-11-11 DIAGNOSIS — I129 Hypertensive chronic kidney disease with stage 1 through stage 4 chronic kidney disease, or unspecified chronic kidney disease: Secondary | ICD-10-CM | POA: Diagnosis not present

## 2020-11-20 ENCOUNTER — Ambulatory Visit: Payer: Medicare Other | Admitting: Physician Assistant

## 2020-11-24 ENCOUNTER — Ambulatory Visit: Payer: Medicare Other

## 2020-12-02 ENCOUNTER — Ambulatory Visit (INDEPENDENT_AMBULATORY_CARE_PROVIDER_SITE_OTHER): Payer: Medicare Other | Admitting: Physician Assistant

## 2020-12-02 ENCOUNTER — Encounter: Payer: Self-pay | Admitting: Physician Assistant

## 2020-12-02 VITALS — BP 144/80 | HR 106 | Ht 60.0 in | Wt 192.1 lb

## 2020-12-02 DIAGNOSIS — K641 Second degree hemorrhoids: Secondary | ICD-10-CM

## 2020-12-02 DIAGNOSIS — K625 Hemorrhage of anus and rectum: Secondary | ICD-10-CM

## 2020-12-02 DIAGNOSIS — Z800 Family history of malignant neoplasm of digestive organs: Secondary | ICD-10-CM

## 2020-12-02 MED ORDER — HYDROCORTISONE (PERIANAL) 2.5 % EX CREA
1.0000 | TOPICAL_CREAM | Freq: Two times a day (BID) | CUTANEOUS | 1 refills | Status: DC
Start: 2020-12-02 — End: 2023-10-29

## 2020-12-02 NOTE — Patient Instructions (Addendum)
We have sent the following medications to your pharmacy for you to pick up at your convenience: Hydrocortisone cream - apply a small amount to a Preparation H suppository twice daily for 1-2 weeks.   Start Miralax 1 capful daily in 8 ounces of liquid.  Start Benefiber or Citrucel 2 teaspoons in 8 ounces of liquid daily.  Increase water to 8 - 8 ounce glasses daily.    Follow up in 2-3 months to discuss colonoscopy.   If you are age 75 or older, your body mass index should be between 23-30. Your Body mass index is 37.52 kg/m. If this is out of the aforementioned range listed, please consider follow up with your Primary Care Provider.  If you are age 77 or younger, your body mass index should be between 19-25. Your Body mass index is 37.52 kg/m. If this is out of the aformentioned range listed, please consider follow up with your Primary Care Provider.   __________________________________________________________  The [facility] providers would like to encourage you to use [facility] to communicate with providers for non-urgent requests or questions.  Due to long hold times on the telephone, sending your provider a message by [facility] may be a faster and more efficient way to get a response.  Please allow 48 business hours for a response.  Please remember that this is for non-urgent requests.

## 2020-12-02 NOTE — Progress Notes (Addendum)
Chief Complaint: Hemorrhoids  HPI:    [name] [name] is a 75 year old female with a past medical history of anxiety, morbid obesity, IBS, stage III CKD and others listed below, known to Dr. Carlean Purl, who was referred to me by Janie Morning, DO for a complaint of hemorrhoids.    08/2011 colonoscopy with mild left colon diverticulosis and no polyps or hemorrhoids.    01/15/2015 patient seen in clinic by [facility] for a "protrusion" on her rectum.    Today, the patient presents to clinic and tells me that she has a lot going on in her life.  It takes her a while to tell me that she sometimes will feel constipated but she does not think this is a change for her in her life.  Does tell me that she is seeing a small amount of bright red blood when wiping about a month ago and a couple months before that.  Apparently started some Hydrocortisone ointment 2% which she had at home and has been using this once daily over the past couple of weeks and has not seen any further bleeding and denies feeling any hemorrhoids.  Tells me "I had trouble with hemorrhoids before so that is probably what it is".  Aware that she is due for a colonoscopy especially given her family history of colon cancer but does not want to schedule.    Also relays very intermittent reflux symptoms related to eating spicy foods and "stomach gurgling".    Describes a lot of concerns over her CKD which she tells me was stage III but "the numbers have gone down".  Apparently she has follow-up with her kidney specialist as well as her primary care doctor soon and would like to come back to the clinic to discuss her colonoscopy at a different time.  Also feels like she may need an EGD because she hears some "gurgling in there".    Denies fever, chills, weight loss, abdominal pain or symptoms that awaken her from sleep.   Past Medical History:  Diagnosis Date   Anxiety    being weaned off lithium will be off on 06/23/11   Bipolar disorder  ([place])    Cancer ([place]) 07/2019   right breast IDC with DCIS   Diastolic heart failure ([place])    Diverticulosis 09/01/2011   mild, left sided   Family history of breast cancer    Family history of colon cancer    Hyperlipidemia    Hypothyroidism    IBS (irritable bowel syndrome)    Leg cramps    Mood disorder ([place])    Morbidly obese ([facility])    Numbness and tingling in left arm    Osteopenia    Prediabetes    Stage 3 chronic kidney disease ([place])    CKD stg 3   Vitamin D deficiency     Past Surgical History:  Procedure Laterality Date   BREAST LUMPECTOMY WITH RADIOACTIVE SEED AND SENTINEL LYMPH NODE BIOPSY Right 08/29/2019   Procedure: RIGHT BREAST LUMPECTOMY WITH RADIOACTIVE SEED AND SENTINEL LYMPH NODE BIOPSY;  Surgeon: Erroll Luna, MD;  Location: [facility];  Service: General;  Laterality: Right;   COLONOSCOPY  06/09/06   Dr.Orr   COLONOSCOPY  09/01/2011   DILATATION & CURETTAGE/HYSTEROSCOPY WITH TRUECLEAR N/A 11/12/2013   Procedure: RESECTOSCOPIC POLYPECTOMY Irish Lack, D&C;  Surgeon: Terrance Mass, MD;  Location: [facility];  Service: Gynecology;  Laterality: N/A;   ENDOMETRIAL BIOPSY  09/14/2013   Dr. Uvaldo Rising  Current Outpatient Medications  Medication Sig Dispense Refill   anastrozole (ARIMIDEX) 1 MG tablet Take 1 tablet (1 mg total) by mouth daily. 90 tablet 4   b complex vitamins tablet Take 1 tablet by mouth daily.     Biotin 5000 MCG TABS Take 1 tablet by mouth daily.     carbamazepine (TEGRETOL XR) 200 MG 12 hr tablet Take 400 mg by mouth at bedtime.      Cholecalciferol (VITAMIN D-3) 1000 UNITS CAPS Take 1 capsule by mouth daily.     Coenzyme Q10 (CO Q 10 PO) Take 300 mg by mouth daily.     Cyanocobalamin (B-12) 5000 MCG SUBL Place 1 tablet under the tongue every morning.     Magnesium 400 MG TABS Take 1 tablet by mouth daily.     Multiple Vitamins-Minerals (MULTIVITAMIN WITH MINERALS) tablet Take 1 tablet by mouth daily.     Omega-3 Fatty  Acids (FISH OIL) 1200 MG CAPS Take 3 capsules by mouth 2 (two) times daily.      simvastatin (ZOCOR) 20 MG tablet Take 20 mg by mouth daily.     SYNTHROID 88 MCG tablet Take 1 tablet by mouth Daily.     No current facility-administered medications for this visit.    Allergies as of 12/02/2020   (No Known Allergies)    Family History  Problem Relation Age of Onset   Diabetes Mother    Other Mother        meningioma (multiple)   Colon cancer Father 1   Parkinson's disease Sister 59   Alzheimer's disease Maternal Grandfather    Breast cancer Cousin 64       maternal cousin   Other Daughter        tiny meningioma outside of the brain   Ovarian cancer Neg Hx    Colon polyps Neg Hx     Social History   Socioeconomic History   Marital status: Married    Spouse name: Daphene Jaeger Partridge   Number of children: 2   Years of education: 12   Highest education level: High school graduate  Occupational History   Occupation: Retired  Tobacco Use   Smoking status: Never   Smokeless tobacco: Never  Vaping Use   Vaping Use: Never used  Substance and Sexual Activity   Alcohol use: No    Alcohol/week: 0.0 standard drinks   Drug use: No   Sexual activity: Not Currently    Birth control/protection: Post-menopausal  Other Topics Concern   Not on file  Social History Narrative   Married, Retired   1 son - Janet Humphreys, works in [employer]    1 daughter - [name], works as a Hospital doctor   Social Determinants of Radio broadcast assistant Strain: Not on Art therapist Insecurity: Not on Pensions consultant Needs: Not on file  Physical Activity: Not on file  Stress: Not on file  Social Connections: Not on file  Intimate Partner Violence: Not on file    Review of Systems:    Constitutional: No weight loss, fever or chills Skin: No rash Cardiovascular: No chest pain Respiratory: No SOB  Gastrointestinal: See HPI and otherwise  negative Genitourinary: No dysuria or change in urinary frequency Neurological: No headache, dizziness or syncope Musculoskeletal: No new muscle or joint pain Hematologic: No bruising Psychiatric: No history of depression or anxiety   Physical Exam:  Vital signs: BP (!) 144/80   Pulse (!) 106   Ht  5' (1.524 m)   Wt 192 lb 2 oz (87.1 kg)   BMI 37.52 kg/m    Constitutional:   Pleasant Elderly overweight Caucasian female appears to be in NAD, Well developed, Well nourished, alert and cooperative Head:  Normocephalic and atraumatic. Eyes:   PEERL, EOMI. No icterus. Conjunctiva pink. Ears:  Normal auditory acuity. Neck:  Supple Throat: Oral cavity and pharynx without inflammation, swelling or lesion.  Respiratory: Respirations even and unlabored. Lungs clear to auscultation bilaterally.   No wheezes, crackles, or rhonchi.  Cardiovascular: Normal S1, S2. No MRG. Regular rate and rhythm. No peripheral edema, cyanosis or pallor.  Gastrointestinal:  Soft, nondistended, nontender. No rebound or guarding. Normal bowel sounds. No appreciable masses or hepatomegaly. Rectal: External: Hemorrhoid tags; internal/anoscopy: Grade 2 internal hemorrhoids with stigmata of recent bleeding Msk:  Symmetrical without gross deformities. Without edema, no deformity or joint abnormality.  Neurologic:  Alert and  oriented x4;  grossly normal neurologically.  Skin:   Dry and intact without significant lesions or rashes. Psychiatric: Demonstrates good judgement and reason without abnormal affect or behaviors.  No recent labs available  Assessment: 1.  Rectal bleeding: On 2 separate occasions, most likely related to hemorrhoids which were inflamed today with stigmata of recent bleeding, but she is also not up-to-date on her colon cancer screening so cannot rule this out 2.  Grade 2 hemorrhoids: As above 3.  Family history of colon cancer: In her father 26.  GERD: Very intermittent related to diet  Plan: 1.   Prescribed Hydrocortisone ointment to be applied to Preparation H suppositories twice daily x1 to 2 weeks. 2.  Recommend the patient increase fiber the fiber supplement such as Metamucil once daily and increase water intake to the 6-8 eight ounce glasses of water per day as well as start MiraLAX once daily 3.  Discussed with patient that she is overdue for colonoscopy especially given her family history of colon cancer.  I recommended that she have this done but she declined.  She tells me she has "too much going on". 4.  Patient also feels to some level that she needs to have an EGD, though she has never had one and only has intermittent reflux symptoms.  Again she declined scheduling. 5.  Patient was scheduled for follow-up with me in the clinic in 2 to 3 months.  At that time we will rediscuss her colonoscopy and possible EGD.  Ellouise Newer, PA-C [facility] 12/02/2020, 2:17 PM  Cc: Janie Morning, DO   Father had CRCA dx in his 28's so patient's risk of CRCA not elevated and would not necessarily factor in FHx CRCA alone and she is not overdue in this regard - prior recommendation in 2013 per other gastroenterologist not what is currently recommended.  Agree w/ having her f/u in clinic.  Gatha Mayer, MD, [facility]

## 2020-12-08 ENCOUNTER — Ambulatory Visit: Payer: Medicare Other | Attending: Surgery

## 2020-12-08 ENCOUNTER — Other Ambulatory Visit: Payer: Self-pay

## 2020-12-08 VITALS — Wt 187.0 lb

## 2020-12-08 DIAGNOSIS — Z483 Aftercare following surgery for neoplasm: Secondary | ICD-10-CM | POA: Insufficient documentation

## 2020-12-08 NOTE — Therapy (Addendum)
[facility] @ [place], [place], [address] Phone: [phone]   Fax:  [phone]  Physical Therapy Treatment  Patient Details  Name: [name] [name] MRN: [id number] Date of Birth: Dec 03, 1945 No data recorded  Encounter Date: 12/08/2020   PT End of Session - 12/08/20 1534     Visit Number 1   # unchanged due to screen only   PT Start Time 5537    PT Stop Time 1537    PT Time Calculation (min) 6 min    Activity Tolerance Patient tolerated treatment well    Behavior During Therapy [facility] for tasks assessed/performed             Past Medical History:  Diagnosis Date   Anxiety    being weaned off lithium will be off on 06/23/11   Bipolar disorder ([place])    Cancer ([place]) 07/2019   right breast IDC with DCIS   Diastolic heart failure ([place])    Diverticulosis 09/01/2011   mild, left sided   Family history of breast cancer    Family history of colon cancer    Hyperlipidemia    Hypothyroidism    IBS (irritable bowel syndrome)    Leg cramps    Mood disorder ([place])    Morbidly obese ([facility])    Numbness and tingling in left arm    Osteopenia    Prediabetes    Stage 3 chronic kidney disease ([place])    CKD stg 3   Vitamin D deficiency     Past Surgical History:  Procedure Laterality Date   back biopsy     for skin   BREAST LUMPECTOMY WITH RADIOACTIVE SEED AND SENTINEL LYMPH NODE BIOPSY Right 08/29/2019   Procedure: RIGHT BREAST LUMPECTOMY WITH RADIOACTIVE SEED AND SENTINEL LYMPH NODE BIOPSY;  Surgeon: Erroll Luna, MD;  Location: [facility];  Service: General;  Laterality: Right;   COLONOSCOPY  06/09/2006   Dr.Orr   COLONOSCOPY  09/01/2011   DILATATION & CURETTAGE/HYSTEROSCOPY WITH TRUECLEAR N/A 11/12/2013   Procedure: RESECTOSCOPIC POLYPECTOMY Irish Lack, D&C;  Surgeon: Terrance Mass, MD;  Location: [facility];  Service: Gynecology;  Laterality: N/A;   ENDOMETRIAL BIOPSY  09/14/2013   Dr.  Uvaldo Rising    Vitals:   12/08/20 1532  Weight: 187 lb (84.8 kg)     Subjective Assessment - 12/08/20 1531     Subjective Pt returns for her 3 month L-Dex screen.    Pertinent History Patient was diagnosed on 07/12/2019 with right grade II invasive ductal carcinoma breast cancer. Patient reports she underwent a right lumpectomy and sentinel node biopsy (4 negative nodes) on 08/29/2019. It is ER/PR positive and HER2 negative with a Ki67 of 2%.                    L-DEX FLOWSHEETS - 12/08/20 1500       L-DEX LYMPHEDEMA SCREENING   Measurement Type Unilateral    L-DEX MEASUREMENT EXTREMITY Upper Extremity    POSITION  Standing    DOMINANT SIDE Right    At Risk Side Right    BASELINE SCORE (UNILATERAL) -4.2    L-DEX SCORE (UNILATERAL) -4.8    VALUE CHANGE (UNILAT) -0.6  Plan - 12/08/20 1537     Clinical Impression Statement Pt returns for her 3 month L-Dex screen. Her change from baseline of -0.6 is WNLs so no further treatment is required at this time except to cont every 3 month L-Dex screen which pt is agreeable to. Pt does report is feeling alot of stress in her current living situation with her husband having dementia and her adult son who also lives with them doesn't help her much with ADLs and she is also worried about her own health. So with pts permission will inbox messaged Edwyna Shell and Bary Castilla to request referral for a therapist or counselor consult.    PT Next Visit Plan Cont every 3 month L-Dex screens for up to 2 years for her 2 years from her SLNB (~08/28/21)    Consulted and Agree with Plan of Care Patient             Patient will benefit from skilled therapeutic intervention in order to improve the following deficits and impairments:     Visit Diagnosis: Aftercare following surgery for neoplasm     Problem List Patient Active Problem List   Diagnosis Date  Noted   Genetic testing 08/10/2019   Family history of breast cancer    Family history of colon cancer    Malignant neoplasm of upper-inner quadrant of right breast in female, estrogen receptor positive ([place]) 07/30/2019   Osteopenia 09/10/2016   Vaginal atrophy 09/10/2015   Menopause 09/10/2015    Otelia Limes, PTA 12/08/2020, 4:43 PM  [facility] @ [place], [place], [address] Phone: [phone]   Fax:  [phone]  Name: [name] [name] MRN: [id number] Date of Birth: 05-12-1945

## 2020-12-09 DIAGNOSIS — I129 Hypertensive chronic kidney disease with stage 1 through stage 4 chronic kidney disease, or unspecified chronic kidney disease: Secondary | ICD-10-CM | POA: Diagnosis not present

## 2020-12-09 DIAGNOSIS — N281 Cyst of kidney, acquired: Secondary | ICD-10-CM | POA: Diagnosis not present

## 2020-12-09 DIAGNOSIS — N2581 Secondary hyperparathyroidism of renal origin: Secondary | ICD-10-CM | POA: Diagnosis not present

## 2020-12-09 DIAGNOSIS — N1831 Chronic kidney disease, stage 3a: Secondary | ICD-10-CM | POA: Diagnosis not present

## 2020-12-11 DIAGNOSIS — H52203 Unspecified astigmatism, bilateral: Secondary | ICD-10-CM | POA: Diagnosis not present

## 2020-12-12 ENCOUNTER — Encounter: Payer: Self-pay | Admitting: General Practice

## 2020-12-12 NOTE — Progress Notes (Signed)
[facility] Spiritual Care Note  Referred by Harlene Salts and Jessee Avers for listening presence and emotional support. Reached Ms Scientific laboratory technician by phone, providing opportunity for her to share and process stressors and goals.  Stressors include caregiving for her husband, who is developing some dementia, and feeling overwhelmed and bogged down by inertia since habits changed during covid.  Her goals include returning to a nearby weekly [place] meeting for support, walking 3x/week in her neighborhood and/or returning to [employer], and sitting down with her son (who, along with his almost 42yo daughter, lives with her) to talk through her organizational system for important paperwork. Improving her physical and mental health are motivators for enacting her plan.  Ms Grays plans to put my direct-dial number into her phone and to call in November for a check-in.   [place], [place], [facility] Pager [phone] Voicemail [phone]

## 2020-12-31 NOTE — Progress Notes (Signed)
Cardiology Office Note:    Date:  01/01/2021   ID:  [name] [name], DOB 1945/04/18, MRN [id number]  PCP:  Janie Morning, DO   [facility] Providers Cardiologist:  Werner Lean, MD     Referring MD: Valinda Party, MD   CC: Discuss echos Consulted for the evaluation of cardiac imaging discussions at the behest of [place], DO  History of Present Illness:    [name] [name] is a 75 y.o. female with a hx of diastolic HF NOS, CKD Stage IIIa, who presents 01/01/21 to discuss echocardiograms.  Oncological History notable for: Malignancies: ER PR positive breast cancer Surgery: R lumpectomy Chemotherapy: anastrazole but no toxic chemo Cessations for Toxicity: NA Radiation: greater that 30 Gy radiation Oncology care spearheaded by: Magrinat  Patient notes that she is feeling very nervous- there is a lot going on at home, husband has mild Alzheimer's Dementia.  Has had no chest pain, chest pressure, chest tightness, chest stinging . Notes fatigue and muscle aches and pains that are related not being able to take care of her self.  Patient exertion notable for walking around her block with stopping to rest for hip pain.  No shortness of breath, DOE.  No PND or orthopnea.  Notes long term weight gain; her life is very hectic and she stopped going to the [facility].  She lost her best girlfriend to heart surgery and hasn't gone back since.  This was not the case one year ago.   leg swelling is noted but no abdominal swelling.  No syncope or near syncope . Notes  no palpitations or funny heart beats.     Past Medical History:  Diagnosis Date   Anxiety    being weaned off lithium will be off on 06/23/11   Bipolar disorder ([place])    Cancer ([place]) 07/2019   right breast IDC with DCIS   Diastolic heart failure ([place])    Diverticulosis 09/01/2011   mild, left sided   Family history of breast cancer    Family history of colon cancer    Hyperlipidemia    Hypothyroidism    IBS  (irritable bowel syndrome)    Leg cramps    Mood disorder ([place])    Morbidly obese (HCC)    Numbness and tingling in left arm    Osteopenia    Prediabetes    Stage 3 chronic kidney disease ([place])    CKD stg 3   Vitamin D deficiency     Past Surgical History:  Procedure Laterality Date   back biopsy     for skin   BREAST LUMPECTOMY WITH RADIOACTIVE SEED AND SENTINEL LYMPH NODE BIOPSY Right 08/29/2019   Procedure: RIGHT BREAST LUMPECTOMY WITH RADIOACTIVE SEED AND SENTINEL LYMPH NODE BIOPSY;  Surgeon: Erroll Luna, MD;  Location: [facility];  Service: General;  Laterality: Right;   COLONOSCOPY  06/09/2006   Dr.Orr   COLONOSCOPY  09/01/2011   DILATATION & CURETTAGE/HYSTEROSCOPY WITH TRUECLEAR N/A 11/12/2013   Procedure: RESECTOSCOPIC POLYPECTOMY Irish Lack, D&C;  Surgeon: Terrance Mass, MD;  Location: [facility];  Service: Gynecology;  Laterality: N/A;   ENDOMETRIAL BIOPSY  09/14/2013   Dr. Uvaldo Rising    Current Medications: Current Meds  Medication Sig   anastrozole (ARIMIDEX) 1 MG tablet Take 1 tablet (1 mg total) by mouth daily.   b complex vitamins tablet Take 1 tablet by mouth daily.   Biotin 5000 MCG TABS Take 1 tablet by mouth daily.   carbamazepine (TEGRETOL  XR) 200 MG 12 hr tablet Take 400 mg by mouth at bedtime.    Cholecalciferol (VITAMIN D-3) 1000 UNITS CAPS Take 1 capsule by mouth daily.   clobetasol (TEMOVATE) 0.05 % external solution Apply topically 2 (two) times daily as needed.   Coenzyme Q10 (CO Q 10 PO) Take 300 mg by mouth daily.   Cyanocobalamin (B-12) 5000 MCG SUBL Place 1 tablet under the tongue every morning.   furosemide (LASIX) 40 MG tablet Take 1 tablet (40 mg total) by mouth daily.   hydrocortisone (ANUSOL-HC) 2.5 % rectal cream Place 1 application rectally 2 (two) times daily.   hydrOXYzine (ATARAX/VISTARIL) 10 MG tablet as needed for itching or anxiety.   Magnesium 400 MG TABS Take 1 tablet by mouth daily.   Multiple  Vitamins-Minerals (MULTIVITAMIN WITH MINERALS) tablet Take 1 tablet by mouth daily.   Omega-3 Fatty Acids (FISH OIL) 1200 MG CAPS Take 3 capsules by mouth 2 (two) times daily.    rosuvastatin (CRESTOR) 5 MG tablet Take 1 tablet (5 mg total) by mouth daily.   SYNTHROID 88 MCG tablet Take 1 tablet by mouth Daily.   [DISCONTINUED] nitroGLYCERIN (NITROSTAT) 0.4 MG SL tablet You may take up to 3 tablets   [DISCONTINUED] simvastatin (ZOCOR) 20 MG tablet Take 20 mg by mouth daily.     Allergies:   Patient has no known allergies.   Social History   Socioeconomic History   Marital status: Married    Spouse name: Karianna Gusman   Number of children: 2   Years of education: 12   Highest education level: High school graduate  Occupational History   Occupation: Retired  Tobacco Use   Smoking status: Never   Smokeless tobacco: Never  Vaping Use   Vaping Use: Never used  Substance and Sexual Activity   Alcohol use: No    Alcohol/week: 0.0 standard drinks   Drug use: No   Sexual activity: Not Currently    Birth control/protection: Post-menopausal  Other Topics Concern   Not on file  Social History Narrative   Married, Retired   1 son - Kaida Games, works in [employer]    1 daughter - [name], works as a Hospital doctor   Social Determinants of Radio broadcast assistant Strain: Not on [employer] Insecurity: Not on Pensions consultant Needs: Not on file  Physical Activity: Not on file  Stress: Not on file  Social Connections: Not on file    Social: son has liver disease, granddaughter is living at home, husband has dementia early onset  Family History: The patient's family history includes Alzheimer's disease in her maternal grandfather; Breast cancer (age of onset: 18) in her cousin; Cancer - Colon (age of onset: 35) in her father; Colon cancer (age of onset: 32) in her father; Diabetes in her mother; Other in her daughter and mother;  Parkinson's disease (age of onset: 17) in her sister. There is no history of Ovarian cancer or Colon polyps.  ROS:   Please see the history of present illness.    Hips hurt. Peripheral neuropathy in her feet.  All other systems reviewed and are negative.  EKGs/Labs/Other Studies Reviewed:    The following studies were reviewed today:   EKG:  EKG is  ordered today.  The ekg ordered today demonstrates  01/01/21: SR rate 99  Transthoracic Echocardiogram: Date: 12/08/2017 Results: - Left ventricle: The cavity size was mildly dilated. Systolic    function was vigorous. The estimated  ejection fraction was in the    range of 65% to 70%. Wall motion was normal; there were no    regional wall motion abnormalities. There was an increased    relative contribution of atrial contraction to ventricular    filling. Doppler parameters are consistent with abnormal left    ventricular relaxation (grade 1 diastolic dysfunction).  - Aortic valve: Trileaflet; normal thickness, mildly calcified    leaflets.  - Mitral valve: There was mild regurgitation directed eccentrically    and anteriorly.  - Atrial septum: There was increased thickness of the septum,    consistent with lipomatous hypertrophy.  - Tricuspid valve: There was trivial regurgitation.   Date 01/01/21 Moderate MD and Grade 1 Diastolic dysfunction EF 17%.   Recent Labs: No results found for requested labs within last 8760 hours.  Recent Lipid Panel No results found for: CHOL, TRIG, HDL, CHOLHDL, VLDL, LDLCALC, LDLDIRECT   Physical Exam:    VS:  BP 140/68   Pulse 99   Ht 5' (1.524 m)   Wt 191 lb (86.6 kg)   SpO2 95%   BMI 37.30 kg/m     Wt Readings from Last 3 Encounters:  01/01/21 191 lb (86.6 kg)  12/08/20 187 lb (84.8 kg)  12/02/20 192 lb 2 oz (87.1 kg)    Gen: No distress, morbid obesity Neck: No JVD Cardiac: No Rubs or Gallops, holosystolic Murmur, normal rhythm, +2 radial pulses Respiratory: Clear to  auscultation bilaterally, normal effort, normal  respiratory rate GI: Soft, nontender, non-distended  MS: +1 edema bilaterally;  moves all extremities Integument: Skin feels warm Neuro:  At time of evaluation, alert and oriented to person/place/time/situation  Psych: Nervous affect, patient feels nervous  ASSESSMENT:    1. Chronic diastolic heart failure ([place])   2. Nonrheumatic mitral valve regurgitation   3. Malignant neoplasm of upper-inner quadrant of right breast in female, estrogen receptor positive ([place])   4. Stage 3a chronic kidney disease ([place])   5. Chronic heart failure with preserved ejection fraction (HCC)    PLAN:    Chronic diastolic HF and HTN CKD Stage IIIa Moderate MR NAFLD Breast cancer with radiation increasing risk for CAD Morbid Obesity - LDL 101, would  transition simvastatin 20 to rosuvastatin 5 mg, lipids in three months - lasix 40 mg PO daily and labs in 7-10 days (BMP and BNP); if itching we will do torsemide 20 mg PO daily instead - echo in September 2023 - we discussed salt load and volume -  at next visit will discussed SGLT2i -  discussed outside echo at length    F/u 4-5 months with me or APP, patient would prefer to see me if possible   Medication Adjustments/Labs and Tests Ordered: Current medicines are reviewed at length with the patient today.  Concerns regarding medicines are outlined above.  Orders Placed This Encounter  Procedures   Basic metabolic panel   EKG 40-CXKG   ECHOCARDIOGRAM COMPLETE    Meds ordered this encounter  Medications   rosuvastatin (CRESTOR) 5 MG tablet    Sig: Take 1 tablet (5 mg total) by mouth daily.    Dispense:  90 tablet    Refill:  3   furosemide (LASIX) 40 MG tablet    Sig: Take 1 tablet (40 mg total) by mouth daily.    Dispense:  90 tablet    Refill:  3   DISCONTD: nitroGLYCERIN (NITROSTAT) 0.4 MG SL tablet    Sig: You may take up to  3 tablets    Dispense:  25 tablet    Refill:  2     Patient  Instructions  Medication Instructions:  Your physician has recommended you make the following change in your medication:  STOP: simvastatin  START: rosuvastatin (Crestor) 5 mg by mouth daily START: furosemide (Lasix) 40 mg by mouth daily  *If you need a refill on your cardiac medications before your next appointment, please call your pharmacy*   Lab Work: IN 7-10 DAYS: BMP If you have labs (blood work) drawn today and your tests are completely normal, you will receive your results only by: [place] (if you have MyChart) OR A paper copy in the mail If you have any lab test that is abnormal or we need to change your treatment, we will call you to review the results.   Testing/Procedures: Your physician has requested that you have an echocardiogram in September. Echocardiography is a painless test that uses sound waves to create images of your heart. It provides your doctor with information about the size and shape of your heart and how well your heart's chambers and valves are working. This procedure takes approximately one hour. There are no restrictions for this procedure.    Follow-Up: At [facility], you and your health needs are our priority.  As part of our continuing mission to provide you with exceptional heart care, we have created designated Provider Care Teams.  These Care Teams include your primary Cardiologist (physician) and Advanced Practice Providers (APPs -  Physician Assistants and Nurse Practitioners) who all work together to provide you with the care you need, when you need it.  We recommend signing up for the patient portal called "MyChart".  Sign up information is provided on this After Visit Summary.  MyChart is used to connect with patients for Virtual Visits (Telemedicine).  Patients are able to view lab/test results, encounter notes, upcoming appointments, etc.  Non-urgent messages can be sent to your provider as well.   To learn more about what you can do  with MyChart, go to [email or web address].    Your next appointment:   4-5 month(s)  The format for your next appointment:   In Person  Provider:   Werner Lean, MD  or Melina Copa, PA-C        :1}         Signed, Werner Lean, MD  01/01/2021 3:42 PM    [place]

## 2021-01-01 ENCOUNTER — Encounter: Payer: Self-pay | Admitting: Internal Medicine

## 2021-01-01 ENCOUNTER — Ambulatory Visit (INDEPENDENT_AMBULATORY_CARE_PROVIDER_SITE_OTHER): Payer: Medicare Other | Admitting: Internal Medicine

## 2021-01-01 ENCOUNTER — Other Ambulatory Visit: Payer: Self-pay

## 2021-01-01 VITALS — BP 140/68 | HR 99 | Ht 60.0 in | Wt 191.0 lb

## 2021-01-01 DIAGNOSIS — I340 Nonrheumatic mitral (valve) insufficiency: Secondary | ICD-10-CM | POA: Insufficient documentation

## 2021-01-01 DIAGNOSIS — C50211 Malignant neoplasm of upper-inner quadrant of right female breast: Secondary | ICD-10-CM

## 2021-01-01 DIAGNOSIS — Z170 Estrogen receptor positive status [ER+]: Secondary | ICD-10-CM

## 2021-01-01 DIAGNOSIS — I5032 Chronic diastolic (congestive) heart failure: Secondary | ICD-10-CM | POA: Diagnosis not present

## 2021-01-01 DIAGNOSIS — N1831 Chronic kidney disease, stage 3a: Secondary | ICD-10-CM | POA: Diagnosis not present

## 2021-01-01 MED ORDER — NITROGLYCERIN 0.4 MG SL SUBL
SUBLINGUAL_TABLET | SUBLINGUAL | 2 refills | Status: DC
Start: 2021-01-01 — End: 2021-01-01

## 2021-01-01 MED ORDER — FUROSEMIDE 40 MG PO TABS
40.0000 mg | ORAL_TABLET | Freq: Every day | ORAL | 3 refills | Status: DC
Start: 2021-01-01 — End: 2021-04-03

## 2021-01-01 MED ORDER — ROSUVASTATIN CALCIUM 5 MG PO TABS
5.0000 mg | ORAL_TABLET | Freq: Every day | ORAL | 3 refills | Status: DC
Start: 2021-01-01 — End: 2022-07-08

## 2021-01-01 NOTE — Patient Instructions (Signed)
Medication Instructions:  Your physician has recommended you make the following change in your medication:  STOP: simvastatin  START: rosuvastatin (Crestor) 5 mg by mouth daily START: furosemide (Lasix) 40 mg by mouth daily  *If you need a refill on your cardiac medications before your next appointment, please call your pharmacy*   Lab Work: IN 7-10 DAYS: BMP If you have labs (blood work) drawn today and your tests are completely normal, you will receive your results only by: [place] (if you have MyChart) OR A paper copy in the mail If you have any lab test that is abnormal or we need to change your treatment, we will call you to review the results.   Testing/Procedures: Your physician has requested that you have an echocardiogram in September. Echocardiography is a painless test that uses sound waves to create images of your heart. It provides your doctor with information about the size and shape of your heart and how well your heart's chambers and valves are working. This procedure takes approximately one hour. There are no restrictions for this procedure.    Follow-Up: At [facility] you and your health needs are our priority.  As part of our continuing mission to provide you with exceptional heart care, we have created designated Provider Care Teams.  These Care Teams include your primary Cardiologist (physician) and Advanced Practice Providers (APPs -  Physician Assistants and Nurse Practitioners) who all work together to provide you with the care you need, when you need it.  We recommend signing up for the patient portal called "MyChart".  Sign up information is provided on this After Visit Summary.  MyChart is used to connect with patients for Virtual Visits (Telemedicine).  Patients are able to view lab/test results, encounter notes, upcoming appointments, etc.  Non-urgent messages can be sent to your provider as well.   To learn more about what you can do with MyChart,  go to [email or web address].    Your next appointment:   4-5 month(s)  The format for your next appointment:   In Person  Provider:   Werner Lean, MD  or Melina Copa, PA-C        :1}

## 2021-01-02 ENCOUNTER — Telehealth: Payer: Self-pay | Admitting: Internal Medicine

## 2021-01-02 NOTE — Telephone Encounter (Signed)
Called pt and advised that MD did not prescribe potassium.  Pt informed that follow up lab work will determine if potassium supplement is needed.  Pt verbalizes understanding.  All questions answered.

## 2021-01-02 NOTE — Telephone Encounter (Signed)
Pt was in to see Dr. Gasper Sells yesterday, she wants to know if Dr. Gasper Sells wants her to take Potassium

## 2021-01-06 ENCOUNTER — Telehealth: Payer: Self-pay | Admitting: Physician Assistant

## 2021-01-06 DIAGNOSIS — H1131 Conjunctival hemorrhage, right eye: Secondary | ICD-10-CM | POA: Diagnosis not present

## 2021-01-06 NOTE — Telephone Encounter (Signed)
Returned call to patient. Advised that it is documented in the last office note that an anoscope was used and that is how the internal hemorrhoids were visualized. Pt states that she just wanted to check because she did not recall having that done. Pt states that she does remember Anderson Malta saying that she was going to take a look. Pt just wanted to confirm. Pt verbalized understanding and had no concerns at the end of the call.

## 2021-01-06 NOTE — Telephone Encounter (Signed)
Inbound call from patient requesting a call back to discuss about her explanation of benefits. States she didn't have an anoscopy and just looked at her rectum with her eyes.

## 2021-01-12 ENCOUNTER — Other Ambulatory Visit: Payer: Self-pay

## 2021-01-12 ENCOUNTER — Other Ambulatory Visit: Payer: Medicare Other | Admitting: *Deleted

## 2021-01-12 DIAGNOSIS — I5032 Chronic diastolic (congestive) heart failure: Secondary | ICD-10-CM | POA: Diagnosis not present

## 2021-01-12 DIAGNOSIS — N1831 Chronic kidney disease, stage 3a: Secondary | ICD-10-CM

## 2021-01-12 DIAGNOSIS — I340 Nonrheumatic mitral (valve) insufficiency: Secondary | ICD-10-CM | POA: Diagnosis not present

## 2021-01-12 LAB — BASIC METABOLIC PANEL
BUN/Creatinine Ratio: 19 (ref 12–28)
BUN: 28 mg/dL — ABNORMAL HIGH (ref 8–27)
CO2: 25 mmol/L (ref 20–29)
Calcium: 9.6 mg/dL (ref 8.7–10.3)
Chloride: 100 mmol/L (ref 96–106)
Creatinine, Ser: 1.49 mg/dL — ABNORMAL HIGH (ref 0.57–1.00)
Glucose: 128 mg/dL — ABNORMAL HIGH (ref 70–99)
Potassium: 3.9 mmol/L (ref 3.5–5.2)
Sodium: 144 mmol/L (ref 134–144)
eGFR: 36 mL/min/{1.73_m2} — ABNORMAL LOW (ref >59)

## 2021-01-13 ENCOUNTER — Ambulatory Visit: Payer: Medicare Other | Admitting: Obstetrics & Gynecology

## 2021-01-19 DIAGNOSIS — N1832 Chronic kidney disease, stage 3b: Secondary | ICD-10-CM | POA: Diagnosis not present

## 2021-01-19 DIAGNOSIS — E785 Hyperlipidemia, unspecified: Secondary | ICD-10-CM | POA: Diagnosis not present

## 2021-01-19 DIAGNOSIS — E039 Hypothyroidism, unspecified: Secondary | ICD-10-CM | POA: Diagnosis not present

## 2021-01-19 DIAGNOSIS — R7303 Prediabetes: Secondary | ICD-10-CM | POA: Diagnosis not present

## 2021-01-19 DIAGNOSIS — E559 Vitamin D deficiency, unspecified: Secondary | ICD-10-CM | POA: Diagnosis not present

## 2021-02-25 DIAGNOSIS — L821 Other seborrheic keratosis: Secondary | ICD-10-CM | POA: Diagnosis not present

## 2021-02-25 DIAGNOSIS — L309 Dermatitis, unspecified: Secondary | ICD-10-CM | POA: Diagnosis not present

## 2021-02-25 DIAGNOSIS — D225 Melanocytic nevi of trunk: Secondary | ICD-10-CM | POA: Diagnosis not present

## 2021-02-25 DIAGNOSIS — L814 Other melanin hyperpigmentation: Secondary | ICD-10-CM | POA: Diagnosis not present

## 2021-03-05 DIAGNOSIS — M5410 Radiculopathy, site unspecified: Secondary | ICD-10-CM | POA: Diagnosis not present

## 2021-03-08 ENCOUNTER — Encounter (HOSPITAL_COMMUNITY): Payer: Self-pay | Admitting: Oncology

## 2021-03-08 ENCOUNTER — Emergency Department (HOSPITAL_COMMUNITY): Payer: Medicare Other

## 2021-03-08 ENCOUNTER — Inpatient Hospital Stay (HOSPITAL_COMMUNITY)
Admission: EM | Admit: 2021-03-08 | Discharge: 2021-04-03 | DRG: 871 | Disposition: A | Discharge status: Skilled Nursing Facility | Payer: Medicare Other | Attending: Family Medicine | Admitting: Family Medicine

## 2021-03-08 ENCOUNTER — Other Ambulatory Visit: Payer: Self-pay

## 2021-03-08 DIAGNOSIS — I700 Atherosclerosis of aorta: Secondary | ICD-10-CM | POA: Diagnosis not present

## 2021-03-08 DIAGNOSIS — M25519 Pain in unspecified shoulder: Secondary | ICD-10-CM

## 2021-03-08 DIAGNOSIS — D6489 Other specified anemias: Secondary | ICD-10-CM | POA: Diagnosis present

## 2021-03-08 DIAGNOSIS — G062 Extradural and subdural abscess, unspecified: Secondary | ICD-10-CM | POA: Diagnosis present

## 2021-03-08 DIAGNOSIS — R911 Solitary pulmonary nodule: Secondary | ICD-10-CM | POA: Diagnosis not present

## 2021-03-08 DIAGNOSIS — R739 Hyperglycemia, unspecified: Secondary | ICD-10-CM

## 2021-03-08 DIAGNOSIS — Y92003 Bedroom of unspecified non-institutional (private) residence as the place of occurrence of the external cause: Secondary | ICD-10-CM | POA: Diagnosis not present

## 2021-03-08 DIAGNOSIS — A4101 Sepsis due to Methicillin susceptible Staphylococcus aureus: Secondary | ICD-10-CM | POA: Diagnosis not present

## 2021-03-08 DIAGNOSIS — E1169 Type 2 diabetes mellitus with other specified complication: Secondary | ICD-10-CM | POA: Diagnosis present

## 2021-03-08 DIAGNOSIS — R918 Other nonspecific abnormal finding of lung field: Secondary | ICD-10-CM | POA: Diagnosis not present

## 2021-03-08 DIAGNOSIS — M546 Pain in thoracic spine: Secondary | ICD-10-CM | POA: Diagnosis not present

## 2021-03-08 DIAGNOSIS — B370 Candidal stomatitis: Secondary | ICD-10-CM | POA: Clinically undetermined

## 2021-03-08 DIAGNOSIS — R531 Weakness: Secondary | ICD-10-CM

## 2021-03-08 DIAGNOSIS — N1330 Unspecified hydronephrosis: Secondary | ICD-10-CM | POA: Diagnosis present

## 2021-03-08 DIAGNOSIS — Z20822 Contact with and (suspected) exposure to covid-19: Secondary | ICD-10-CM | POA: Diagnosis not present

## 2021-03-08 DIAGNOSIS — E1122 Type 2 diabetes mellitus with diabetic chronic kidney disease: Secondary | ICD-10-CM | POA: Diagnosis not present

## 2021-03-08 DIAGNOSIS — A419 Sepsis, unspecified organism: Secondary | ICD-10-CM | POA: Diagnosis not present

## 2021-03-08 DIAGNOSIS — E119 Type 2 diabetes mellitus without complications: Secondary | ICD-10-CM

## 2021-03-08 DIAGNOSIS — W19XXXA Unspecified fall, initial encounter: Secondary | ICD-10-CM | POA: Diagnosis not present

## 2021-03-08 DIAGNOSIS — R338 Other retention of urine: Secondary | ICD-10-CM | POA: Diagnosis not present

## 2021-03-08 DIAGNOSIS — F319 Bipolar disorder, unspecified: Secondary | ICD-10-CM | POA: Diagnosis not present

## 2021-03-08 DIAGNOSIS — W06XXXA Fall from bed, initial encounter: Secondary | ICD-10-CM | POA: Diagnosis present

## 2021-03-08 DIAGNOSIS — M6008 Infective myositis, other site: Secondary | ICD-10-CM | POA: Diagnosis not present

## 2021-03-08 DIAGNOSIS — K5900 Constipation, unspecified: Secondary | ICD-10-CM

## 2021-03-08 DIAGNOSIS — R262 Difficulty in walking, not elsewhere classified: Secondary | ICD-10-CM | POA: Diagnosis not present

## 2021-03-08 DIAGNOSIS — Y92009 Unspecified place in unspecified non-institutional (private) residence as the place of occurrence of the external cause: Secondary | ICD-10-CM | POA: Diagnosis not present

## 2021-03-08 DIAGNOSIS — M4626 Osteomyelitis of vertebra, lumbar region: Secondary | ICD-10-CM | POA: Diagnosis present

## 2021-03-08 DIAGNOSIS — E871 Hypo-osmolality and hyponatremia: Secondary | ICD-10-CM | POA: Diagnosis not present

## 2021-03-08 DIAGNOSIS — N183 Chronic kidney disease, stage 3 unspecified: Secondary | ICD-10-CM | POA: Diagnosis not present

## 2021-03-08 DIAGNOSIS — D638 Anemia in other chronic diseases classified elsewhere: Secondary | ICD-10-CM | POA: Diagnosis not present

## 2021-03-08 DIAGNOSIS — R6520 Severe sepsis without septic shock: Secondary | ICD-10-CM | POA: Diagnosis not present

## 2021-03-08 DIAGNOSIS — N179 Acute kidney failure, unspecified: Secondary | ICD-10-CM | POA: Diagnosis present

## 2021-03-08 DIAGNOSIS — G9341 Metabolic encephalopathy: Secondary | ICD-10-CM | POA: Diagnosis not present

## 2021-03-08 DIAGNOSIS — I130 Hypertensive heart and chronic kidney disease with heart failure and stage 1 through stage 4 chronic kidney disease, or unspecified chronic kidney disease: Secondary | ICD-10-CM | POA: Diagnosis not present

## 2021-03-08 DIAGNOSIS — E870 Hyperosmolality and hypernatremia: Secondary | ICD-10-CM | POA: Diagnosis not present

## 2021-03-08 DIAGNOSIS — M25532 Pain in left wrist: Secondary | ICD-10-CM | POA: Diagnosis not present

## 2021-03-08 DIAGNOSIS — K6812 Psoas muscle abscess: Secondary | ICD-10-CM | POA: Diagnosis not present

## 2021-03-08 DIAGNOSIS — R1310 Dysphagia, unspecified: Secondary | ICD-10-CM | POA: Diagnosis not present

## 2021-03-08 DIAGNOSIS — J189 Pneumonia, unspecified organism: Secondary | ICD-10-CM

## 2021-03-08 DIAGNOSIS — R2689 Other abnormalities of gait and mobility: Secondary | ICD-10-CM | POA: Diagnosis not present

## 2021-03-08 DIAGNOSIS — R7881 Bacteremia: Secondary | ICD-10-CM | POA: Diagnosis present

## 2021-03-08 DIAGNOSIS — M25539 Pain in unspecified wrist: Secondary | ICD-10-CM

## 2021-03-08 DIAGNOSIS — Z800 Family history of malignant neoplasm of digestive organs: Secondary | ICD-10-CM

## 2021-03-08 DIAGNOSIS — N1832 Chronic kidney disease, stage 3b: Secondary | ICD-10-CM | POA: Diagnosis not present

## 2021-03-08 DIAGNOSIS — S40012A Contusion of left shoulder, initial encounter: Secondary | ICD-10-CM | POA: Diagnosis present

## 2021-03-08 DIAGNOSIS — M6281 Muscle weakness (generalized): Secondary | ICD-10-CM | POA: Diagnosis not present

## 2021-03-08 DIAGNOSIS — E1165 Type 2 diabetes mellitus with hyperglycemia: Secondary | ICD-10-CM | POA: Diagnosis present

## 2021-03-08 DIAGNOSIS — S199XXA Unspecified injury of neck, initial encounter: Secondary | ICD-10-CM | POA: Diagnosis not present

## 2021-03-08 DIAGNOSIS — R509 Fever, unspecified: Secondary | ICD-10-CM | POA: Diagnosis not present

## 2021-03-08 DIAGNOSIS — L0291 Cutaneous abscess, unspecified: Secondary | ICD-10-CM

## 2021-03-08 DIAGNOSIS — M25531 Pain in right wrist: Secondary | ICD-10-CM | POA: Diagnosis not present

## 2021-03-08 DIAGNOSIS — D649 Anemia, unspecified: Secondary | ICD-10-CM | POA: Diagnosis present

## 2021-03-08 DIAGNOSIS — J690 Pneumonitis due to inhalation of food and vomit: Secondary | ICD-10-CM | POA: Diagnosis not present

## 2021-03-08 DIAGNOSIS — M4620 Osteomyelitis of vertebra, site unspecified: Secondary | ICD-10-CM | POA: Diagnosis not present

## 2021-03-08 DIAGNOSIS — B9561 Methicillin susceptible Staphylococcus aureus infection as the cause of diseases classified elsewhere: Secondary | ICD-10-CM | POA: Diagnosis not present

## 2021-03-08 DIAGNOSIS — R7989 Other specified abnormal findings of blood chemistry: Secondary | ICD-10-CM | POA: Diagnosis present

## 2021-03-08 DIAGNOSIS — M48061 Spinal stenosis, lumbar region without neurogenic claudication: Secondary | ICD-10-CM | POA: Diagnosis not present

## 2021-03-08 DIAGNOSIS — J90 Pleural effusion, not elsewhere classified: Secondary | ICD-10-CM | POA: Diagnosis not present

## 2021-03-08 DIAGNOSIS — Z79899 Other long term (current) drug therapy: Secondary | ICD-10-CM

## 2021-03-08 DIAGNOSIS — M25512 Pain in left shoulder: Secondary | ICD-10-CM | POA: Diagnosis not present

## 2021-03-08 DIAGNOSIS — R296 Repeated falls: Secondary | ICD-10-CM | POA: Diagnosis present

## 2021-03-08 DIAGNOSIS — E118 Type 2 diabetes mellitus with unspecified complications: Secondary | ICD-10-CM | POA: Diagnosis not present

## 2021-03-08 DIAGNOSIS — M4312 Spondylolisthesis, cervical region: Secondary | ICD-10-CM | POA: Diagnosis not present

## 2021-03-08 DIAGNOSIS — M4316 Spondylolisthesis, lumbar region: Secondary | ICD-10-CM | POA: Diagnosis not present

## 2021-03-08 DIAGNOSIS — Z833 Family history of diabetes mellitus: Secondary | ICD-10-CM

## 2021-03-08 DIAGNOSIS — A4189 Other specified sepsis: Secondary | ICD-10-CM | POA: Diagnosis not present

## 2021-03-08 DIAGNOSIS — I5032 Chronic diastolic (congestive) heart failure: Secondary | ICD-10-CM | POA: Diagnosis not present

## 2021-03-08 DIAGNOSIS — D72829 Elevated white blood cell count, unspecified: Secondary | ICD-10-CM | POA: Diagnosis not present

## 2021-03-08 DIAGNOSIS — E039 Hypothyroidism, unspecified: Secondary | ICD-10-CM | POA: Diagnosis present

## 2021-03-08 DIAGNOSIS — G319 Degenerative disease of nervous system, unspecified: Secondary | ICD-10-CM | POA: Diagnosis not present

## 2021-03-08 DIAGNOSIS — J9601 Acute respiratory failure with hypoxia: Secondary | ICD-10-CM | POA: Diagnosis not present

## 2021-03-08 DIAGNOSIS — R279 Unspecified lack of coordination: Secondary | ICD-10-CM | POA: Diagnosis not present

## 2021-03-08 DIAGNOSIS — Z743 Need for continuous supervision: Secondary | ICD-10-CM | POA: Diagnosis not present

## 2021-03-08 DIAGNOSIS — Z86000 Personal history of in-situ neoplasm of breast: Secondary | ICD-10-CM

## 2021-03-08 DIAGNOSIS — E785 Hyperlipidemia, unspecified: Secondary | ICD-10-CM | POA: Diagnosis present

## 2021-03-08 DIAGNOSIS — R7401 Elevation of levels of liver transaminase levels: Secondary | ICD-10-CM | POA: Diagnosis not present

## 2021-03-08 DIAGNOSIS — E559 Vitamin D deficiency, unspecified: Secondary | ICD-10-CM | POA: Diagnosis present

## 2021-03-08 DIAGNOSIS — R0902 Hypoxemia: Secondary | ICD-10-CM | POA: Diagnosis not present

## 2021-03-08 DIAGNOSIS — S0990XA Unspecified injury of head, initial encounter: Secondary | ICD-10-CM | POA: Diagnosis not present

## 2021-03-08 DIAGNOSIS — M549 Dorsalgia, unspecified: Secondary | ICD-10-CM | POA: Diagnosis not present

## 2021-03-08 DIAGNOSIS — E861 Hypovolemia: Secondary | ICD-10-CM | POA: Diagnosis present

## 2021-03-08 DIAGNOSIS — Z6836 Body mass index (BMI) 36.0-36.9, adult: Secondary | ICD-10-CM

## 2021-03-08 LAB — URINALYSIS, ROUTINE W REFLEX MICROSCOPIC
Bacteria, UA: NONE SEEN
Bilirubin Urine: NEGATIVE
Glucose, UA: NEGATIVE mg/dL
Ketones, ur: NEGATIVE mg/dL
Leukocytes,Ua: NEGATIVE
Nitrite: NEGATIVE
Protein, ur: 100 mg/dL — AB
Specific Gravity, Urine: 1.01 (ref 1.005–1.030)
pH: 6 (ref 5.0–8.0)

## 2021-03-08 LAB — CBC WITH DIFFERENTIAL/PLATELET
Abs Immature Granulocytes: 0.17 10*3/uL — ABNORMAL HIGH (ref 0.00–0.07)
Basophils Absolute: 0.1 10*3/uL (ref 0.0–0.1)
Basophils Relative: 0 %
Eosinophils Absolute: 0 10*3/uL (ref 0.0–0.5)
Eosinophils Relative: 0 %
HCT: 31.9 % — ABNORMAL LOW (ref 36.0–46.0)
Hemoglobin: 10.5 g/dL — ABNORMAL LOW (ref 12.0–15.0)
Immature Granulocytes: 1 %
Lymphocytes Relative: 2 %
Lymphs Abs: 0.3 10*3/uL — ABNORMAL LOW (ref 0.7–4.0)
MCH: 31.7 pg (ref 26.0–34.0)
MCHC: 32.9 g/dL (ref 30.0–36.0)
MCV: 96.4 fL (ref 80.0–100.0)
Monocytes Absolute: 1.2 10*3/uL — ABNORMAL HIGH (ref 0.1–1.0)
Monocytes Relative: 7 %
Neutro Abs: 14.5 10*3/uL — ABNORMAL HIGH (ref 1.7–7.7)
Neutrophils Relative %: 90 %
Platelets: 137 10*3/uL — ABNORMAL LOW (ref 150–400)
RBC: 3.31 MIL/uL — ABNORMAL LOW (ref 3.87–5.11)
RDW: 13 % (ref 11.5–15.5)
WBC: 16.2 10*3/uL — ABNORMAL HIGH (ref 4.0–10.5)
nRBC: 0 % (ref 0.0–0.2)

## 2021-03-08 LAB — LACTIC ACID, PLASMA: Lactic Acid, Venous: 1.8 mmol/L (ref 0.5–1.9)

## 2021-03-08 LAB — COMPREHENSIVE METABOLIC PANEL
ALT: 113 U/L — ABNORMAL HIGH (ref 0–44)
AST: 197 U/L — ABNORMAL HIGH (ref 15–41)
Albumin: 2.8 g/dL — ABNORMAL LOW (ref 3.5–5.0)
Alkaline Phosphatase: 64 U/L (ref 38–126)
Anion gap: 11 (ref 5–15)
BUN: 52 mg/dL — ABNORMAL HIGH (ref 8–23)
CO2: 23 mmol/L (ref 22–32)
Calcium: 8.7 mg/dL — ABNORMAL LOW (ref 8.9–10.3)
Chloride: 95 mmol/L — ABNORMAL LOW (ref 98–111)
Creatinine, Ser: 2.47 mg/dL — ABNORMAL HIGH (ref 0.44–1.00)
GFR, Estimated: 20 mL/min — ABNORMAL LOW (ref >60)
Glucose, Bld: 269 mg/dL — ABNORMAL HIGH (ref 70–99)
Potassium: 4 mmol/L (ref 3.5–5.1)
Sodium: 129 mmol/L — ABNORMAL LOW (ref 135–145)
Total Bilirubin: 0.6 mg/dL (ref 0.3–1.2)
Total Protein: 7 g/dL (ref 6.5–8.1)

## 2021-03-08 LAB — RESP PANEL BY RT-PCR (FLU A&B, COVID) ARPGX2
Influenza A by PCR: NEGATIVE
Influenza B by PCR: NEGATIVE
SARS Coronavirus 2 by RT PCR: NEGATIVE

## 2021-03-08 LAB — CK: Total CK: 2237 U/L — ABNORMAL HIGH (ref 38–234)

## 2021-03-08 LAB — GLUCOSE, CAPILLARY: Glucose-Capillary: 202 mg/dL — ABNORMAL HIGH (ref 70–99)

## 2021-03-08 LAB — PROTIME-INR
INR: 1.1 (ref 0.8–1.2)
Prothrombin Time: 14.5 seconds (ref 11.4–15.2)

## 2021-03-08 LAB — APTT: aPTT: 31 seconds (ref 24–36)

## 2021-03-08 LAB — TSH: TSH: 0.429 u[IU]/mL (ref 0.350–4.500)

## 2021-03-08 MED ORDER — OXYCODONE HCL 5 MG PO TABS: 5.0000 mg | ORAL_TABLET | ORAL | Status: DC | PRN

## 2021-03-08 MED ORDER — HEPARIN SODIUM (PORCINE) 5000 UNIT/ML IJ SOLN
5000.0000 [IU] | Freq: Three times a day (TID) | INTRAMUSCULAR | Status: DC
  Administered 2021-03-08 – 2021-04-03 (×77): 5000 [IU] via SUBCUTANEOUS
  Filled 2021-03-08 (×78): qty 1

## 2021-03-08 MED ORDER — ACETAMINOPHEN 325 MG PO TABS
650.0000 mg | ORAL_TABLET | Freq: Once | ORAL | Status: AC
  Administered 2021-03-08: 650 mg via ORAL
  Filled 2021-03-08: qty 2

## 2021-03-08 MED ORDER — SODIUM CHLORIDE 0.9 % IV SOLN: INTRAVENOUS | Status: DC

## 2021-03-08 MED ORDER — SODIUM CHLORIDE 0.9% FLUSH
3.0000 mL | Freq: Two times a day (BID) | INTRAVENOUS | Status: DC
  Administered 2021-03-08 – 2021-04-02 (×37): 3 mL via INTRAVENOUS

## 2021-03-08 MED ORDER — INSULIN ASPART 100 UNIT/ML IJ SOLN
0.0000 [IU] | Freq: Every day | INTRAMUSCULAR | Status: DC
  Administered 2021-03-08 – 2021-03-15 (×4): 2 [IU] via SUBCUTANEOUS
  Filled 2021-03-08: qty 0.05

## 2021-03-08 MED ORDER — METRONIDAZOLE 500 MG/100ML IV SOLN
500.0000 mg | Freq: Once | INTRAVENOUS | Status: AC
Start: 2021-03-08 — End: 2021-03-08
  Administered 2021-03-08: 500 mg via INTRAVENOUS
  Filled 2021-03-08: qty 100

## 2021-03-08 MED ORDER — SODIUM CHLORIDE 0.9 % IV SOLN
2.0000 g | INTRAVENOUS | Status: DC
  Administered 2021-03-08: 2 g via INTRAVENOUS
  Filled 2021-03-08: qty 20

## 2021-03-08 MED ORDER — ACETAMINOPHEN 325 MG PO TABS
650.0000 mg | ORAL_TABLET | Freq: Four times a day (QID) | ORAL | Status: DC | PRN
  Administered 2021-03-15 – 2021-04-02 (×26): 650 mg via ORAL
  Filled 2021-03-08 (×29): qty 2

## 2021-03-08 MED ORDER — SODIUM CHLORIDE 0.9 % IV SOLN
2.0000 g | Freq: Once | INTRAVENOUS | Status: AC
  Administered 2021-03-08: 2 g via INTRAVENOUS
  Filled 2021-03-08: qty 2

## 2021-03-08 MED ORDER — INSULIN ASPART 100 UNIT/ML IJ SOLN
0.0000 [IU] | Freq: Three times a day (TID) | INTRAMUSCULAR | Status: DC
  Administered 2021-03-09: 9 [IU] via SUBCUTANEOUS
  Administered 2021-03-09: 2 [IU] via SUBCUTANEOUS
  Administered 2021-03-09: 3 [IU] via SUBCUTANEOUS
  Administered 2021-03-10: 17:00:00 2 [IU] via SUBCUTANEOUS
  Administered 2021-03-10: 13:00:00 3 [IU] via SUBCUTANEOUS
  Administered 2021-03-10 – 2021-03-12 (×5): 2 [IU] via SUBCUTANEOUS
  Administered 2021-03-12: 1 [IU] via SUBCUTANEOUS
  Administered 2021-03-12: 2 [IU] via SUBCUTANEOUS
  Administered 2021-03-13 (×2): 5 [IU] via SUBCUTANEOUS
  Administered 2021-03-13: 2 [IU] via SUBCUTANEOUS
  Administered 2021-03-14: 1 [IU] via SUBCUTANEOUS
  Administered 2021-03-14 – 2021-03-15 (×2): 3 [IU] via SUBCUTANEOUS
  Administered 2021-03-15 (×2): 1 [IU] via SUBCUTANEOUS
  Administered 2021-03-16: 2 [IU] via SUBCUTANEOUS
  Administered 2021-03-16: 3 [IU] via SUBCUTANEOUS
  Administered 2021-03-17: 2 [IU] via SUBCUTANEOUS
  Administered 2021-03-17: 3 [IU] via SUBCUTANEOUS
  Administered 2021-03-18 (×2): 1 [IU] via SUBCUTANEOUS
  Administered 2021-03-18: 2 [IU] via SUBCUTANEOUS
  Administered 2021-03-19: 1 [IU] via SUBCUTANEOUS
  Administered 2021-03-19 – 2021-03-20 (×3): 3 [IU] via SUBCUTANEOUS
  Administered 2021-03-20: 2 [IU] via SUBCUTANEOUS
  Administered 2021-03-21: 3 [IU] via SUBCUTANEOUS
  Administered 2021-03-22: 1 [IU] via SUBCUTANEOUS
  Administered 2021-03-22: 3 [IU] via SUBCUTANEOUS
  Administered 2021-03-23: 1 [IU] via SUBCUTANEOUS
  Administered 2021-03-24 (×2): 5 [IU] via SUBCUTANEOUS
  Administered 2021-03-25: 1 [IU] via SUBCUTANEOUS
  Administered 2021-03-25: 7 [IU] via SUBCUTANEOUS
  Administered 2021-03-26: 2 [IU] via SUBCUTANEOUS
  Administered 2021-03-26: 1 [IU] via SUBCUTANEOUS
  Administered 2021-03-27: 2 [IU] via SUBCUTANEOUS
  Administered 2021-03-28: 1 [IU] via SUBCUTANEOUS
  Administered 2021-03-28: 2 [IU] via SUBCUTANEOUS
  Administered 2021-03-28 – 2021-03-29 (×3): 1 [IU] via SUBCUTANEOUS
  Administered 2021-03-30: 2 [IU] via SUBCUTANEOUS
  Administered 2021-03-30 (×2): 1 [IU] via SUBCUTANEOUS
  Administered 2021-03-31 – 2021-04-01 (×2): 2 [IU] via SUBCUTANEOUS
  Administered 2021-04-01 (×2): 1 [IU] via SUBCUTANEOUS
  Administered 2021-04-02: 2 [IU] via SUBCUTANEOUS
  Administered 2021-04-02 – 2021-04-03 (×2): 1 [IU] via SUBCUTANEOUS
  Administered 2021-04-03: 2 [IU] via SUBCUTANEOUS
  Filled 2021-03-08: qty 0.09

## 2021-03-08 MED ORDER — METRONIDAZOLE 500 MG/100ML IV SOLN
500.0000 mg | Freq: Two times a day (BID) | INTRAVENOUS | Status: DC
  Administered 2021-03-09: 500 mg via INTRAVENOUS
  Filled 2021-03-08: qty 100

## 2021-03-08 MED ORDER — LACTATED RINGERS IV BOLUS (SEPSIS)
1000.0000 mL | Freq: Once | INTRAVENOUS | Status: AC
  Administered 2021-03-08: 1000 mL via INTRAVENOUS

## 2021-03-08 MED ORDER — ACETAMINOPHEN 650 MG RE SUPP: 650.0000 mg | Freq: Four times a day (QID) | RECTAL | Status: DC | PRN

## 2021-03-08 MED ORDER — NAPHAZOLINE-GLYCERIN 0.012-0.25 % OP SOLN
1.0000 [drp] | Freq: Four times a day (QID) | OPHTHALMIC | Status: DC | PRN
  Administered 2021-03-09: 2 [drp] via OPHTHALMIC
  Administered 2021-04-02: 1 [drp] via OPHTHALMIC
  Filled 2021-03-08 (×2): qty 15

## 2021-03-08 MED ORDER — LACTATED RINGERS IV SOLN: INTRAVENOUS | Status: DC

## 2021-03-08 MED ORDER — FLUTICASONE PROPIONATE 50 MCG/ACT NA SUSP
2.0000 | Freq: Every day | NASAL | Status: DC
Start: 2021-03-09 — End: 2021-03-09

## 2021-03-08 MED ORDER — LEVOTHYROXINE SODIUM 88 MCG PO TABS
88.0000 ug | ORAL_TABLET | Freq: Every day | ORAL | Status: DC
  Administered 2021-03-09 – 2021-04-03 (×23): 88 ug via ORAL
  Filled 2021-03-08 (×24): qty 1

## 2021-03-08 MED ORDER — FENTANYL CITRATE PF 50 MCG/ML IJ SOSY
25.0000 ug | PREFILLED_SYRINGE | Freq: Once | INTRAMUSCULAR | Status: AC
  Administered 2021-03-08: 25 ug via INTRAVENOUS
  Filled 2021-03-08: qty 1

## 2021-03-08 MED ORDER — VANCOMYCIN HCL IN DEXTROSE 1-5 GM/200ML-% IV SOLN
1000.0000 mg | Freq: Once | INTRAVENOUS | Status: AC
  Administered 2021-03-08: 1000 mg via INTRAVENOUS
  Filled 2021-03-08: qty 200

## 2021-03-08 MED ORDER — ORAL CARE MOUTH RINSE
15.0000 mL | Freq: Two times a day (BID) | OROMUCOSAL | Status: DC
  Administered 2021-03-09 – 2021-03-10 (×4): 15 mL via OROMUCOSAL

## 2021-03-08 MED ORDER — SODIUM CHLORIDE 0.9 % IV SOLN
500.0000 mg | INTRAVENOUS | Status: DC
  Administered 2021-03-08: 500 mg via INTRAVENOUS
  Filled 2021-03-08: qty 5

## 2021-03-08 NOTE — Plan of Care (Signed)

## 2021-03-08 NOTE — Assessment & Plan Note (Addendum)
-   TSH, 0.429 - continue Synthroid

## 2021-03-08 NOTE — Assessment & Plan Note (Addendum)
-   Presumed due to underlying pneumonia/aspiration -Patient doing well.  Down to 2 L nasal cannula to 100%.  Attempting to wean off oxygen altogether - See sepsis and bacteremia work-up otherwise - remains on RA

## 2021-03-08 NOTE — ED Triage Notes (Signed)
Pt bib [facility] from home d/t multiple falls.  Initially called out for back pain however upon arrival pt was prone on the floor.  Pt has bruising to face noted.  Pt w/ memory impairment.  Unclear if pt started on hydrocodone recently.  Pt denied neck and back pain to EMS . C-collar in place as precaution.  Pt also denies falling.

## 2021-03-08 NOTE — ED Provider Notes (Signed)
[facility] EMERGENCY DEPARTMENT Provider Note    CSN: [id number] Arrival date & time: 03/08/21 1332  History Chief Complaint  Patient presents with   [name] [name] is a 76 y.o. female here for evaluation after a fall, brought by EMS. History is supplemented by son over the phone who reports she was in her usual state of health 4 days ago when she began complaining of some burning in her back. The following day she reported she was tingling in her legs and needed help walking into her doctors office where she was given Rx for hydrocodone and another med the son is not sure of for 'slipped disk'. She has gotten worse in the last two days with increased weakness, decreased PO intake, inability to walk and falling. She reports she fell out of bed last night while trying to turn off the light. She lives with her husband who has dementia and was not able to help much. She was found on the floor today by EMS but she is unsure how she got there. She denies any pain at the time of my evaluation. She denies cough, fever, SOB. She is foggy on the details of the last two days but is oriented to name, date and location. She has not had any N/V/D or dysuria at home. EMS reported she was hypoxic when they got her, improved with supplemental O2. Does not wear oxygen at home.    Home Medications Prior to Admission medications   Medication Sig Start Date End Date Taking? Authorizing Provider  anastrozole (ARIMIDEX) 1 MG tablet Take 1 tablet (1 mg total) by mouth daily. 08/06/20  Yes Magrinat, Virgie Dad, MD  b complex vitamins tablet Take 1 tablet by mouth daily.   Yes [provider]  Biotin 5000 MCG TABS Take 1 tablet by mouth daily.   Yes [provider]  carbamazepine (TEGRETOL XR) 200 MG 12 hr tablet Take 200 mg by mouth at bedtime.   Yes [provider]  Cholecalciferol (VITAMIN D-3) 1000 UNITS CAPS Take 2,000 Units by mouth daily.   Yes [provider]   Coenzyme Q10 (CO Q-10) 300 MG CAPS Take 300 mg by mouth at bedtime.   Yes [provider]  Cyanocobalamin (B-12) 5000 MCG SUBL Place 1 tablet under the tongue every morning.   Yes [provider]  furosemide (LASIX) 40 MG tablet Take 1 tablet (40 mg total) by mouth daily. 01/01/21  Yes Chandrasekhar, Mahesh A, MD  HYDROcodone-acetaminophen (NORCO) 7.5-325 MG tablet Take 1 tablet by mouth every 6 (six) hours as needed for pain. 03/06/21  Yes [provider]  levothyroxine (SYNTHROID) 88 MCG tablet Take 88 mcg by mouth daily before breakfast.   Yes [provider]  Magnesium 400 MG TABS Take 400 mg by mouth daily.   Yes [provider]  Omega-3 Fatty Acids (FISH OIL) 1200 MG CAPS Take 1,200 mg by mouth daily at 6 (six) AM.   Yes [provider]  rosuvastatin (CRESTOR) 5 MG tablet Take 1 tablet (5 mg total) by mouth daily. 01/01/21  Yes Chandrasekhar, Mahesh A, MD  triamcinolone cream (KENALOG) 0.1 % Apply 1 application topically 2 (two) times daily. 02/22/21  Yes [provider]  hydrocortisone (ANUSOL-HC) 2.5 % rectal cream Place 1 application rectally 2 (two) times daily. Patient not taking: Reported on 03/08/2021 12/02/20   Levin Erp, PA  predniSONE (DELTASONE) 10 MG tablet Take by mouth. 03/05/21   [provider]     Allergies    Patient has no known allergies.   Review of Systems   Review of Systems Please see HPI for pertinent positives and negatives  Physical Exam BP (!) 113/59    Pulse (!) 107    Temp (!) 102.1 F (38.9 C) (Rectal)    Resp 17    SpO2 96%   Physical Exam Vitals and nursing note reviewed.  Constitutional:      Appearance: Normal appearance.  HENT:     Head: Normocephalic.     Comments: Bruising to R temple, appears old    Nose: Nose normal.     Mouth/Throat:     Mouth: Mucous membranes are dry.  Eyes:     Extraocular Movements: Extraocular movements intact.      Conjunctiva/sclera: Conjunctivae normal.  Neck:     Comments: In c-collar Cardiovascular:     Rate and Rhythm: Tachycardia present.  Pulmonary:     Effort: Pulmonary effort is normal.     Breath sounds: Normal breath sounds. No wheezing or rhonchi.  Abdominal:     General: Abdomen is flat.     Palpations: Abdomen is soft.     Tenderness: There is no abdominal tenderness. There is no guarding.  Musculoskeletal:        General: Tenderness (R wrist) present. No swelling. Normal range of motion.  Skin:    General: Skin is warm and dry.  Neurological:     General: No focal deficit present.     Mental Status: She is alert and oriented to person, place, and time.     Cranial Nerves: No cranial nerve deficit.     Sensory: No sensory deficit.     Motor: No weakness.  Psychiatric:        Mood and Affect: Mood normal.    ED Results / Procedures / Treatments   EKG EKG Interpretation  Date/Time:  Sunday March 08 2021 14:09:57 EST Ventricular Rate:  115 PR Interval:  141 QRS Duration: 92 QT Interval:  320 QTC Calculation: 443 R Axis:   5 Text Interpretation: Sinus tachycardia Probable left atrial enlargement Low voltage, precordial leads Suspect limb reversal Rate faster Confirmed by Calvert Cantor [phone]) on 03/08/2021 2:15:49 PM  Procedures Procedures  Medications Ordered in the ED Medications  lactated ringers infusion ( Intravenous New Bag/Given 03/08/21 1549)  lactated ringers bolus 1,000 mL (0 mLs Intravenous Stopped 03/08/21 1531)  ceFEPIme (MAXIPIME) 2 g in sodium chloride 0.9 % 100 mL IVPB (0 g Intravenous Stopped 03/08/21 1544)  metroNIDAZOLE (FLAGYL) IVPB 500 mg (0 mg Intravenous Stopped 03/08/21 1620)  vancomycin (VANCOCIN) IVPB 1000 mg/200 mL premix (0 mg Intravenous Stopped 03/08/21 1708)    Initial Impression and Plan  Patient here with generalized weakness, mild confusion, reported falls. Noted to be hypoxic at home, confirmed here, improved with O2. Also found to  be febrile and tachycardic. Will initiate sepsis order set, broad spectrum Abx for unknown source. She does not have any focal deficits or signs of lumbar radiculopathy by exam. Will also check CT head/Cspine for fall.   ED Course   Clinical Course as of 03/08/21 1745  Sun Mar 08, 2021  1430 CXR is clear.  [CS]  1507 CBC with leukocytosis consistent with her suspected sepsis. Already getting Abx and IVF. HGB is also mildly low. HR improving.  [CS]  5643 Wrist xray neg for acute fracture.  [CS]  3295 Coags normal [CS]  1884 CMP shows moderate hyponatremia, AKI  and mild increase in LFTs. She is hyperglycemic as well. No signs of significant metabolic acidosis.  [CS]  1522 Lactic acid is normal.  [CS]  3710 Covid/Flu are neg.  [CS]  6269 UA is negative for infection. Will send for CT chest to eval occult pneumonia given her tachycardia and hypoxia. She is unable to get contrast due to her AKI so will not be able to check for PE on this scan.  [CS]  1710 CT images and results reviewed. No traumatic findings on head and C-spine. Collar removed.  CT chest shows lower lobe infiltrates, could be source of fever and hypoxia. She is resting comfortably now. More alert than previously. I spoke with Daughter, [name] who is HCPOA living in [place] and discussed findings and plan for admission. Hospitalist paged.  [CS]  4854 Spoke with Dr. Sabino Gasser, Hospitalist who will evaluate for admission. He requests CK to evaluate for rhabdo from being on the floor.  [CS]    Clinical Course User Index [CS] Truddie Hidden, MD     MDM Rules/Calculators/A&P Medical Decision Making Problems Addressed: AKI (acute kidney injury) [facility]): acute illness or injury that poses a threat to life or bodily functions Community acquired pneumonia, unspecified laterality: acute illness or injury that poses a threat to life or bodily functions Fall, initial encounter: acute illness or injury that poses a threat to  life or bodily functions Generalized weakness: acute illness or injury that poses a threat to life or bodily functions Sepsis with acute renal failure without septic shock, due to unspecified organism, unspecified acute renal failure type [facility]): acute illness or injury that poses a threat to life or bodily functions  Amount and/or Complexity of Data Reviewed Labs: ordered. Decision-making details documented in ED Course. Radiology: ordered and independent interpretation performed. Decision-making details documented in ED Course. ECG/medicine tests: ordered and independent interpretation performed. Decision-making details documented in ED Course.  Risk Prescription drug management. Decision regarding hospitalization.    Final Clinical Impression(s) / ED Diagnoses Final diagnoses:  Fall, initial encounter  Generalized weakness  Sepsis with acute renal failure without septic shock, due to unspecified organism, unspecified acute renal failure type ([place])  Community acquired pneumonia, unspecified laterality  AKI (acute kidney injury) [facility])    Rx / DC Orders ED Discharge Orders     None        Truddie Hidden, MD 03/08/21 1745

## 2021-03-08 NOTE — Assessment & Plan Note (Addendum)
-   No S/S exacerbation on exam.  Patient is volume depleted clinically Because of need for D5W, has been getting fluids at 125 cc an hour.  BNP elevated given additional fluids.  Repeat BNP on 1/31 normal.  At this point now, we will stop fluids and start Lasix and follow renal function appropriately. -Last echo reviewed from 11/06/2020: Moderate MR, impaired relaxation, EF 65% - TTE repeated on 1/23, EF 60-65%, mild LVH; trivial MR on read; normal diastology

## 2021-03-08 NOTE — Progress Notes (Signed)
Patient requested a nasal spray. MD notified.

## 2021-03-08 NOTE — Assessment & Plan Note (Addendum)
-   No complaints or signs of bleeding.  No recent lab work other than July 2021 (12.3 g/dL at that time).  Hemoglobin stable since admission.

## 2021-03-08 NOTE — Assessment & Plan Note (Addendum)
-   History of prediabetes, no A1c on file - A1c on admission 6.6% - Treat with sliding scale for now and diet control

## 2021-03-08 NOTE — Assessment & Plan Note (Addendum)
-  patient has history of CKD3b. Baseline creat ~ 1.3, eGFR 38 -Creatinine on admission at 2.47 and with fluids, had improved, down to 1.45 on 1/29.  Since then, mild increase in today at 1.57.  Questioning if this could be some acute heart failure and cardiorenal disease.  We will stop fluids and give Lasix and follow accordingly. - bilateral hydronephrosis noted on MRI.  Patient has a pure wick with ongoing urine output however abdomen remains distended concerning for urine retention and/or constipation - s/p bladder scan on 2/2 with large PVR; foley placed with ~3200 cc immediately out in foley - keep foley in place as she's likely so deconditioned it's going to take time for regaining function

## 2021-03-08 NOTE — Progress Notes (Signed)
Patient requested eye drops for dry eyes. MD notified.

## 2021-03-08 NOTE — ED Notes (Signed)
Call for purple man

## 2021-03-08 NOTE — Progress Notes (Signed)
A consult was received from an ED physician for vancomycin and cefepime per pharmacy dosing.  The patient's profile has been reviewed for ht/wt/allergies/indication/available labs.   A one time order has been placed for vancomycin 1g and cefepime 2g.  Further antibiotics/pharmacy consults should be ordered by admitting physician if indicated.                       Thank you, Peggyann Juba, PharmD, BCPS 03/08/2021  2:15 PM

## 2021-03-08 NOTE — Assessment & Plan Note (Addendum)
Suspect secondary to shock liver from sepsis.  Transaminases slowly improving

## 2021-03-08 NOTE — ED Notes (Signed)
Pt is coherent at this time.  Speaking in full sentences w/ appropriate context.

## 2021-03-08 NOTE — H&P (Signed)
History and Physical    [name] [name]  YIF:[id number]  DOB: 1945-02-24  DOA: 03/08/2021  PCP: [name] Morning, DO Patient coming from: Home  Chief Complaint: found on floor  HPI:  [name] is a 76 yo female with PMH anxiety, dCHF, HLD, hypothyroidism, IBS, mood disorder, prediabetes, CKD 3b who presented to the ER after multiple falls at home.  She was found to be on the floor when EMS arrived.  Patient was a poor historian in the ER and unable to provide significant collateral information.  ER provider was able to speak with son over the phone who provided further information. The patient had been feeling well up until about 4 days ago when she began complaining of some burning pain in her back.  She presented to her PCP office and started on hydrocodone.  Apparently she has been at home with ongoing poor appetite and becoming more weak with less ambulation.  She somehow had fallen out of bed today and EMS was called by her son. On work-up in the ER she was found to be febrile, tachycardic, tachypneic, and hypoxic, and was placed on 4 L oxygen. Notable labs include  WBC 16.2, hemoglobin 10.5, platelets 137 Sodium 129, glucose 269, BUN 52, creatinine 2.47, albumin 2.8, AST 197, ALT 113, total bili 0.6, alk phos 64  Multiple imaging studies performed.  CXR notable for low lung volumes and no acute findings.  CT chest showed bilateral pleural effusions, bilateral lower lobe bronchial wall thickening with consolidations concerning for possible pneumonia.  Possible 7 mm left apical lung nodule. CT cervical spine negative for acute abnormalities.  Does show anterolisthesis C3 on C4 due to degenerative changes, no definitive fracture noted.  Advanced degenerative changes at multiple levels in the cervical spine.  CT head negative for acute abnormalities. Due to concern for possible pneumonia with aspiration she was started on vancomycin, cefepime, Flagyl and admitted for further work-up and  evaluation.  I have personally briefly reviewed patient's old medical records in [facility] and discussed patient with the ER provider when appropriate/indicated.  Assessment/Plan: * Sepsis ([place])- (present on admission) - Febrile, tachycardia, tachypnea, leukocytosis; presumed lung source given opacities on CT chest with possible aspiration - Given vancomycin, cefepime, Flagyl in the ER - No significant risk factors for MDRO.  Continue on Rocephin and azithromycin.  Continue Flagyl for possible aspiration coverage - Follow-up Legionella and strep pneumo urinary antigens -Follow-up blood cultures; urinalysis negative for signs of infection  Acute respiratory failure with hypoxia (HCC) - Presumed due to underlying pneumonia/aspiration -Continue oxygen and wean as able - See sepsis work-up otherwise  Acute renal failure superimposed on stage 3b chronic kidney disease ([place]) - patient has history of CKD3b. Baseline creat ~ 1.3, eGFR 38 - patient presents with increase in creat >0.3 mg/dL above baseline, creat increase >1.5x baseline presumed to have occurred within past 7 days PTA -Creatinine 2.47 on admission - Patient appears overtly volume depleted with dry mucous membranes and reports of poor intake over the past couple days - continue on IVF and repeat BMP in am  Hyponatremia - presumed hypovolemic hyponatremia - continue IVF - BMP in am   Fall at home, initial encounter - Patient found on floor by EMS on their arrival.  Patient states floor is carpeted although she cannot provide any further collateral information regarding her fall or length of time on the floor - Given renal failure on admission which is still likely prerenal, check CK - Multiple imaging  studies on admission including right wrist x-ray, CT head, CT cervical spine, and CT chest: All with no acute abnormalities but does have 4 mm anterolisthesis C3 on C4 - continue pain control - will consult PT once more  stable   Hyperglycemia - History of prediabetes, no A1c on file - Obtain A1c now - Treat with sliding scale for now  Chronic diastolic heart failure ([place])- (present on admission) - No S/S exacerbation on exam.  Patient is volume depleted clinically - Continue fluids -Last echo reviewed from 11/06/2020: Moderate MR, impaired relaxation, EF 65%  Hypothyroidism - Check TSH - Resume home Synthroid  Normocytic anemia - No complaints or signs of bleeding.  No recent lab work other than July 2021 (12.3 g/dL at that time) - Hemoglobin 10.5 g/dL on admission - Trend hemoglobin while on fluids as expect some further dilution as well  Elevated LFTs - differential includes cholestasis of sepsis vs hypoperfusion but not high enough for ischemic range though patient down on ground for short duration - trend LFTs for now; if worsen will further workup      Code Status: Full   DVT Prophylaxis: HSQ     Anticipated disposition is to: pending PT eval  History: Past Medical History:  Diagnosis Date   Anxiety    being weaned off lithium will be off on 06/23/11   Bipolar disorder ([place])    Cancer ([place]) 07/2019   right breast IDC with DCIS   Diastolic heart failure ([place])    Diverticulosis 09/01/2011   mild, left sided   Family history of breast cancer    Family history of colon cancer    Hyperlipidemia    Hypothyroidism    IBS (irritable bowel syndrome)    Leg cramps    Mood disorder ([place])    Morbidly obese (HCC)    Numbness and tingling in left arm    Osteopenia    Prediabetes    Stage 3 chronic kidney disease ([place])    CKD stg 3   Vitamin D deficiency     Past Surgical History:  Procedure Laterality Date   back biopsy     for skin   BREAST LUMPECTOMY WITH RADIOACTIVE SEED AND SENTINEL LYMPH NODE BIOPSY Right 08/29/2019   Procedure: RIGHT BREAST LUMPECTOMY WITH RADIOACTIVE SEED AND SENTINEL LYMPH NODE BIOPSY;  Surgeon: Erroll Luna, MD;  Location: [facility];   Service: General;  Laterality: Right;   COLONOSCOPY  06/09/2006   Dr.Orr   COLONOSCOPY  09/01/2011   DILATATION & CURETTAGE/HYSTEROSCOPY WITH TRUECLEAR N/A 11/12/2013   Procedure: RESECTOSCOPIC POLYPECTOMY Irish Lack, D&C;  Surgeon: Terrance Mass, MD;  Location: [facility];  Service: Gynecology;  Laterality: N/A;   ENDOMETRIAL BIOPSY  09/14/2013   Dr. Uvaldo Rising     reports that she has never smoked. She has never used smokeless tobacco. She reports that she does not drink alcohol and does not use drugs.  No Known Allergies  Family History  Problem Relation Age of Onset   Diabetes Mother    Other Mother        meningioma (multiple)   Colon cancer Father 91   Cancer - Colon Father 62       died 48   Parkinson's disease Sister 75   Alzheimer's disease Maternal Grandfather    Other Daughter        tiny meningioma outside of the brain   Breast cancer Cousin 63       maternal cousin  Ovarian cancer Neg Hx    Colon polyps Neg Hx    Home Medications: Prior to Admission medications   Medication Sig Start Date End Date Taking? Authorizing Provider  anastrozole (ARIMIDEX) 1 MG tablet Take 1 tablet (1 mg total) by mouth daily. 08/06/20  Yes Magrinat, Virgie Dad, MD  b complex vitamins tablet Take 1 tablet by mouth daily.   Yes [provider]  Biotin 5000 MCG TABS Take 1 tablet by mouth daily.   Yes [provider]  carbamazepine (TEGRETOL XR) 200 MG 12 hr tablet Take 200 mg by mouth at bedtime.   Yes [provider]  Cholecalciferol (VITAMIN D-3) 1000 UNITS CAPS Take 2,000 Units by mouth daily.   Yes [provider]  Coenzyme Q10 (CO Q-10) 300 MG CAPS Take 300 mg by mouth at bedtime.   Yes [provider]  Cyanocobalamin (B-12) 5000 MCG SUBL Place 1 tablet under the tongue every [name].   Yes [provider]  furosemide (LASIX) 40 MG tablet Take 1 tablet (40 mg total) by mouth daily. 01/01/21  Yes Chandrasekhar, Mahesh A, MD   HYDROcodone-acetaminophen (NORCO) 7.5-325 MG tablet Take 1 tablet by mouth every 6 (six) hours as needed for pain. 03/06/21  Yes [provider]  levothyroxine (SYNTHROID) 88 MCG tablet Take 88 mcg by mouth daily before breakfast.   Yes [provider]  Magnesium 400 MG TABS Take 400 mg by mouth daily.   Yes [provider]  Omega-3 Fatty Acids (FISH OIL) 1200 MG CAPS Take 1,200 mg by mouth daily at 6 (six) AM.   Yes [provider]  rosuvastatin (CRESTOR) 5 MG tablet Take 1 tablet (5 mg total) by mouth daily. 01/01/21  Yes Chandrasekhar, Mahesh A, MD  triamcinolone cream (KENALOG) 0.1 % Apply 1 application topically 2 (two) times daily. 02/22/21  Yes [provider]  hydrocortisone (ANUSOL-HC) 2.5 % rectal cream Place 1 application rectally 2 (two) times daily. Patient not taking: Reported on 03/08/2021 12/02/20   Levin Erp, PA  predniSONE (DELTASONE) 10 MG tablet Take by mouth. 03/05/21   [provider]    Review of Systems:  Review of systems not obtained due to patient factors. Cognitive impairment   Physical Exam:  Vitals:   03/08/21 1445 03/08/21 1500 03/08/21 1600 03/08/21 1730  BP: 129/70 114/73 (!) 147/69 (!) 113/59  Pulse: (!) 107 (!) 109 (!) 105 (!) 107  Resp: (!) 22 (!) 21 (!) 21 17  Temp:      TempSrc:      SpO2: 97% 98% 100% 96%   Physical Exam Constitutional:      Comments: Ill-appearing elderly woman resting in bed uncomfortable but in no distress and with obvious confusion  HENT:     Head: Normocephalic.     Comments: Bruising noted along right forehead    Mouth/Throat:     Mouth: Mucous membranes are dry.     Comments: Grossly dry mucous membranes noted in oral mucosa Eyes:     Extraocular Movements: Extraocular movements intact.  Cardiovascular:     Rate and Rhythm: Normal rate and regular rhythm.  Pulmonary:     Effort: Pulmonary effort is normal.     Comments: Clear sounds  anteriorly Abdominal:     General: Bowel sounds are normal. There is no distension.     Palpations: Abdomen is soft.     Tenderness: There is no abdominal tenderness.  Musculoskeletal:        General: Normal  range of motion.     Cervical back: Normal range of motion and neck supple.     Comments: 1+ bilateral lower extremity edema  Skin:    General: Skin is warm and dry.  Neurological:     Mental Status: She is disoriented.     Comments: Follows commands and moves all 4 extremities but gross confusion  Psychiatric:        Behavior: Behavior normal.     Labs on Admission:  I have personally reviewed following labs and imaging studies Results for orders placed or performed during the hospital encounter of 03/08/21 (from the past 24 hour(s))  Lactic acid, plasma     Status: None   Collection Time: 03/08/21  2:36 PM  Result Value Ref Range   Lactic Acid, Venous 1.8 0.5 - 1.9 mmol/L  Comprehensive metabolic panel     Status: Abnormal   Collection Time: 03/08/21  2:36 PM  Result Value Ref Range   Sodium 129 (L) 135 - 145 mmol/L   Potassium 4.0 3.5 - 5.1 mmol/L   Chloride 95 (L) 98 - 111 mmol/L   CO2 23 22 - 32 mmol/L   Glucose, Bld 269 (H) 70 - 99 mg/dL   BUN 52 (H) 8 - 23 mg/dL   Creatinine, Ser 2.47 (H) 0.44 - 1.00 mg/dL   Calcium 8.7 (L) 8.9 - 10.3 mg/dL   Total Protein 7.0 6.5 - 8.1 g/dL   Albumin 2.8 (L) 3.5 - 5.0 g/dL   AST 197 (H) 15 - 41 U/L   ALT 113 (H) 0 - 44 U/L   Alkaline Phosphatase 64 38 - 126 U/L   Total Bilirubin 0.6 0.3 - 1.2 mg/dL   GFR, Estimated 20 (L) >60 mL/min   Anion gap 11 5 - 15  CBC WITH DIFFERENTIAL     Status: Abnormal   Collection Time: 03/08/21  2:36 PM  Result Value Ref Range   WBC 16.2 (H) 4.0 - 10.5 K/uL   RBC 3.31 (L) 3.87 - 5.11 MIL/uL   Hemoglobin 10.5 (L) 12.0 - 15.0 g/dL   HCT 31.9 (L) 36.0 - 46.0 %   MCV 96.4 80.0 - 100.0 fL   MCH 31.7 26.0 - 34.0 pg   MCHC 32.9 30.0 - 36.0 g/dL   RDW 13.0 11.5 - 15.5 %   Platelets 137 (L) 150  - 400 K/uL   nRBC 0.0 0.0 - 0.2 %   Neutrophils Relative % 90 %   Neutro Abs 14.5 (H) 1.7 - 7.7 K/uL   Lymphocytes Relative 2 %   Lymphs Abs 0.3 (L) 0.7 - 4.0 K/uL   Monocytes Relative 7 %   Monocytes Absolute 1.2 (H) 0.1 - 1.0 K/uL   Eosinophils Relative 0 %   Eosinophils Absolute 0.0 0.0 - 0.5 K/uL   Basophils Relative 0 %   Basophils Absolute 0.1 0.0 - 0.1 K/uL   WBC Morphology MILD LEFT SHIFT (1-5% METAS, OCC MYELO, OCC BANDS)    Immature Granulocytes 1 %   Abs Immature Granulocytes 0.17 (H) 0.00 - 0.07 K/uL   Dohle Bodies PRESENT   Protime-INR     Status: None   Collection Time: 03/08/21  2:36 PM  Result Value Ref Range   Prothrombin Time 14.5 11.4 - 15.2 seconds   INR 1.1 0.8 - 1.2  APTT     Status: None   Collection Time: 03/08/21  2:36 PM  Result Value Ref Range   aPTT 31 24 - 36 seconds  CK  Status: Abnormal   Collection Time: 03/08/21  2:36 PM  Result Value Ref Range   Total CK 2,237 (H) 38 - 234 U/L  Resp Panel by RT-PCR (Flu A&B, Covid) Nasopharyngeal Swab     Status: None   Collection Time: 03/08/21  3:00 PM   Specimen: Nasopharyngeal Swab; Nasopharyngeal(NP) swabs in vial transport medium  Result Value Ref Range   SARS Coronavirus 2 by RT PCR NEGATIVE NEGATIVE   Influenza A by PCR NEGATIVE NEGATIVE   Influenza B by PCR NEGATIVE NEGATIVE  Urinalysis, Routine w reflex microscopic Urine, Catheterized     Status: Abnormal   Collection Time: 03/08/21  3:41 PM  Result Value Ref Range   Color, Urine YELLOW YELLOW   APPearance HAZY (A) CLEAR   Specific Gravity, Urine 1.010 1.005 - 1.030   pH 6.0 5.0 - 8.0   Glucose, UA NEGATIVE NEGATIVE mg/dL   Hgb urine dipstick LARGE (A) NEGATIVE   Bilirubin Urine NEGATIVE NEGATIVE   Ketones, ur NEGATIVE NEGATIVE mg/dL   Protein, ur 100 (A) NEGATIVE mg/dL   Nitrite NEGATIVE NEGATIVE   Leukocytes,Ua NEGATIVE NEGATIVE   WBC, UA 0-5 0 - 5 WBC/hpf   Bacteria, UA NONE SEEN NONE SEEN     Radiological Exams on Admission: DG  Wrist Complete Right  Result Date: 03/08/2021 CLINICAL DATA:  Patient status post fall.  Right wrist pain. EXAM: RIGHT WRIST - COMPLETE 3+ VIEW COMPARISON:  None. FINDINGS: First MCP joint degenerative changes. Normal anatomic alignment. No evidence for acute fracture or dislocation. IMPRESSION: First MCP joint degenerative changes.  No acute osseous abnormality. Electronically Signed   By: Lovey Newcomer M.D.   On: 03/08/2021 15:03   CT Head Wo Contrast  Result Date: 03/08/2021 CLINICAL DATA:  Head trauma multiple fall EXAM: CT HEAD WITHOUT CONTRAST CT CERVICAL SPINE WITHOUT CONTRAST TECHNIQUE: Multidetector CT imaging of the head and cervical spine was performed following the standard protocol without intravenous contrast. Multiplanar CT image reconstructions of the cervical spine were also generated. RADIATION DOSE REDUCTION: This exam was performed according to the departmental dose-optimization program which includes automated exposure control, adjustment of the mA and/or kV according to patient size and/or use of iterative reconstruction technique. COMPARISON:  None. FINDINGS: CT HEAD FINDINGS Brain: No acute territorial infarction, hemorrhage or intracranial mass. Moderate atrophy. The ventricles are nonenlarged Vascular: No hyperdense vessels.  No unexpected calcification Skull: Normal. Negative for fracture or focal lesion. Sinuses/Orbits: No acute finding. Other: None CT CERVICAL SPINE FINDINGS Alignment: Mild reversal of cervical lordosis. 4 mm anterolisthesis C3 on C4. Facet alignment is maintained. Skull base and vertebrae: No acute fracture. No primary bone lesion or focal pathologic process. Soft tissues and spinal canal: No prevertebral fluid or swelling. No visible canal hematoma. Disc levels: Diffuse degenerative changes throughout the cervical spine. Advanced disc space narrowing and degenerative change C4 through T1. Hypertrophic facet degenerative changes left greater than right with multiple  level foraminal narrowing. Upper chest: Negative. Other: None IMPRESSION: 1. No CT evidence for acute intracranial abnormality.  Atrophy. 2. Anterolisthesis C3 on C4 probably due to degenerative change. No definitive fracture is seen. Advanced degenerative changes at multiple levels in the cervical spine. Electronically Signed   By: Donavan Foil M.D.   On: 03/08/2021 16:38   CT Chest Wo Contrast  Result Date: 03/08/2021 CLINICAL DATA:  Shortness of breath EXAM: CT CHEST WITHOUT CONTRAST TECHNIQUE: Multidetector CT imaging of the chest was performed following the standard protocol without IV contrast. RADIATION DOSE REDUCTION:  This exam was performed according to the departmental dose-optimization program which includes automated exposure control, adjustment of the mA and/or kV according to patient size and/or use of iterative reconstruction technique. COMPARISON:  Chest x-ray 03/08/2021 FINDINGS: Cardiovascular: Limited evaluation without intravenous contrast. Mild aortic atherosclerosis. No aneurysm. Borderline cardiac size. No pericardial effusion Mediastinum/Nodes: Midline trachea. No thyroid mass. No suspicious lymph nodes. Esophagus within normal limits. Lungs/Pleura: Trace bilateral effusions. Mild bilateral lower lobe bronchial wall thickening. Partial consolidations at the lung bases probably due to atelectasis. Mild interstitial densities within the peripheral upper lobes possibly due to chronic disease. Possible 7 mm left apical lung nodule, series 7, image 21. Upper Abdomen: No acute abnormality. Musculoskeletal: No chest wall mass or suspicious bone lesions identified. IMPRESSION: 1. Trace bilateral pleural effusions. Mild bilateral lower lobe bronchial wall thickening which could be due to inflammatory process. Partial consolidations in the lower lobes could reflect atelectasis or mild pneumonia. 2. Possible 7 mm left apical lung nodule. Non-contrast chest CT at 6-12 months is recommended. If the  nodule is stable at time of repeat CT, then future CT at 18-24 months (from today's scan) is considered optional for low-risk patients, but is recommended for high-risk patients. This recommendation follows the consensus statement: Guidelines for Management of Incidental Pulmonary Nodules Detected on CT Images: From the Fleischner Society 2017; Radiology 2017; 284:228-243. Aortic Atherosclerosis (ICD10-I70.0). Electronically Signed   By: Donavan Foil M.D.   On: 03/08/2021 16:46   CT Cervical Spine Wo Contrast  Result Date: 03/08/2021 CLINICAL DATA:  Head trauma multiple fall EXAM: CT HEAD WITHOUT CONTRAST CT CERVICAL SPINE WITHOUT CONTRAST TECHNIQUE: Multidetector CT imaging of the head and cervical spine was performed following the standard protocol without intravenous contrast. Multiplanar CT image reconstructions of the cervical spine were also generated. RADIATION DOSE REDUCTION: This exam was performed according to the departmental dose-optimization program which includes automated exposure control, adjustment of the mA and/or kV according to patient size and/or use of iterative reconstruction technique. COMPARISON:  None. FINDINGS: CT HEAD FINDINGS Brain: No acute territorial infarction, hemorrhage or intracranial mass. Moderate atrophy. The ventricles are nonenlarged Vascular: No hyperdense vessels.  No unexpected calcification Skull: Normal. Negative for fracture or focal lesion. Sinuses/Orbits: No acute finding. Other: None CT CERVICAL SPINE FINDINGS Alignment: Mild reversal of cervical lordosis. 4 mm anterolisthesis C3 on C4. Facet alignment is maintained. Skull base and vertebrae: No acute fracture. No primary bone lesion or focal pathologic process. Soft tissues and spinal canal: No prevertebral fluid or swelling. No visible canal hematoma. Disc levels: Diffuse degenerative changes throughout the cervical spine. Advanced disc space narrowing and degenerative change C4 through T1. Hypertrophic facet  degenerative changes left greater than right with multiple level foraminal narrowing. Upper chest: Negative. Other: None IMPRESSION: 1. No CT evidence for acute intracranial abnormality.  Atrophy. 2. Anterolisthesis C3 on C4 probably due to degenerative change. No definitive fracture is seen. Advanced degenerative changes at multiple levels in the cervical spine. Electronically Signed   By: Donavan Foil M.D.   On: 03/08/2021 16:38   DG Chest Port 1 View  Result Date: 03/08/2021 CLINICAL DATA:  Sepsis. EXAM: PORTABLE CHEST 1 VIEW COMPARISON:  None FINDINGS: Normal heart size. No pleural effusion or edema. Lung volumes are low. No airspace opacities. Visualized osseous structures appear intact. IMPRESSION: 1. Low lung volumes. 2. No acute findings. Electronically Signed   By: Kerby Moors M.D.   On: 03/08/2021 14:21   CT Head Wo Contrast  Final Result  CT Cervical Spine Wo Contrast  Final Result    CT Chest Wo Contrast  Final Result    DG Wrist Complete Right  Final Result    DG Chest Port 1 View  Final Result      Consults called:     EKG: Independently reviewed. Sinus tach, rate 115   Dwyane Dee, MD [facility] 03/08/2021, 6:28 PM

## 2021-03-08 NOTE — Progress Notes (Signed)
Patient was unable to answer all questions associated with admission. Admission completed to the best of the ability provided by the patient.

## 2021-03-08 NOTE — Progress Notes (Signed)
Patient showing yellow MEWS. MD notified.

## 2021-03-08 NOTE — Assessment & Plan Note (Addendum)
-   Febrile, tachycardia, tachypnea, leukocytosis; presumed lung source given opacities on CT chest with possible aspiration however now having worsening left shoulder pain (s/p L deltoid kenalog injection on 03/06/21) and ongoing low back pain.  White count has been steadily improving for several days. - Given vancomycin, cefepime, Flagyl in the ER - No significant risk factors for MDRO, then changed to CTX/azithro after blood cultures grew MSSA>> changed to Unasyn, and now ultimately is on Ancef - continue q72 hour CRP check; if worsening CRP or mentation then possibly will be changed to nafcillin for 2 weeks.  ID following, greatly appreciate assistance

## 2021-03-08 NOTE — Hospital Course (Addendum)
[name] [name] is a 76 year old female with PMHhypothyroidism, diastolic CHF, stage IIIb chronic kidney disease and mood disorder who presented to the ER on 03/08/2021 after multiple falls at home.  Although poor historian, patient is noted to have slow decline.  In the emergency room, found to be hypoxic and septic from pneumonia.  Following admission, blood cultures positive for MSSA.   MRI noted widespread enhancement of the lower thoracic and lumbar regions involving the dura as well as surface of conus and cauda equina nerve roots with some subdural collections in the lumbar spine. Given enhancement of leptomeninges, neurosurgery recommended infection has spread into CNS and dosing antibiotics for CNS infection recommended.  Plan by infectious disease is to continue on Ancef checking CRP every 72 hours and change antibiotics to nafcillin if CRP worsens or mentation worsens.

## 2021-03-08 NOTE — Sepsis Progress Note (Signed)
Sepsis protocol is being monitored by [facility]. 

## 2021-03-08 NOTE — Assessment & Plan Note (Addendum)
-   presumed hypovolemic hyponatremia.  Resolved with IV fluids

## 2021-03-08 NOTE — ED Notes (Signed)
Per Lattie Haw in the lab they will run additional tests off urine specimen in lab.

## 2021-03-08 NOTE — Assessment & Plan Note (Addendum)
-   Patient found on floor by EMS on their arrival.  Patient states floor is carpeted although she cannot provide any further collateral information regarding her fall or length of time on the floor - Given renal failure on admission which is still likely prerenal, check CK - Multiple imaging studies on admission including right wrist x-ray, CT head, CT cervical spine, and CT chest: All with no acute abnormalities but does have 4 mm anterolisthesis C3 on C4 - continue pain control - continue PT/OT

## 2021-03-08 NOTE — ED Notes (Signed)
Both sets of blood cultures drawn prior to antibiotics being started.

## 2021-03-09 ENCOUNTER — Inpatient Hospital Stay (HOSPITAL_COMMUNITY): Payer: Medicare Other

## 2021-03-09 DIAGNOSIS — R509 Fever, unspecified: Secondary | ICD-10-CM

## 2021-03-09 DIAGNOSIS — B9561 Methicillin susceptible Staphylococcus aureus infection as the cause of diseases classified elsewhere: Secondary | ICD-10-CM

## 2021-03-09 DIAGNOSIS — M6281 Muscle weakness (generalized): Secondary | ICD-10-CM

## 2021-03-09 DIAGNOSIS — R7881 Bacteremia: Secondary | ICD-10-CM

## 2021-03-09 DIAGNOSIS — N179 Acute kidney failure, unspecified: Secondary | ICD-10-CM | POA: Diagnosis not present

## 2021-03-09 DIAGNOSIS — A419 Sepsis, unspecified organism: Secondary | ICD-10-CM

## 2021-03-09 DIAGNOSIS — J9601 Acute respiratory failure with hypoxia: Secondary | ICD-10-CM | POA: Diagnosis not present

## 2021-03-09 DIAGNOSIS — D72829 Elevated white blood cell count, unspecified: Secondary | ICD-10-CM

## 2021-03-09 DIAGNOSIS — M25519 Pain in unspecified shoulder: Secondary | ICD-10-CM

## 2021-03-09 LAB — URINE CULTURE: Culture: NO GROWTH

## 2021-03-09 LAB — BLOOD CULTURE ID PANEL (REFLEXED) - BCID2

## 2021-03-09 LAB — ECHOCARDIOGRAM COMPLETE
AR max vel: 2.71 cm2
AV Peak grad: 5 mmHg
Ao pk vel: 1.12 m/s
Area-P 1/2: 5.84 cm2
S' Lateral: 2.6 cm
Weight: 3015.89 oz

## 2021-03-09 LAB — BASIC METABOLIC PANEL
Anion gap: 10 (ref 5–15)
BUN: 42 mg/dL — ABNORMAL HIGH (ref 8–23)
CO2: 23 mmol/L (ref 22–32)
Calcium: 9 mg/dL (ref 8.9–10.3)
Chloride: 102 mmol/L (ref 98–111)
Creatinine, Ser: 1.78 mg/dL — ABNORMAL HIGH (ref 0.44–1.00)
GFR, Estimated: 29 mL/min — ABNORMAL LOW (ref >60)
Glucose, Bld: 194 mg/dL — ABNORMAL HIGH (ref 70–99)
Potassium: 4 mmol/L (ref 3.5–5.1)
Sodium: 135 mmol/L (ref 135–145)

## 2021-03-09 LAB — CBC WITH DIFFERENTIAL/PLATELET
Abs Immature Granulocytes: 0.35 10*3/uL — ABNORMAL HIGH (ref 0.00–0.07)
Basophils Absolute: 0.1 10*3/uL (ref 0.0–0.1)
Basophils Relative: 1 %
Eosinophils Absolute: 0.1 10*3/uL (ref 0.0–0.5)
Eosinophils Relative: 0 %
HCT: 32.4 % — ABNORMAL LOW (ref 36.0–46.0)
Hemoglobin: 10.6 g/dL — ABNORMAL LOW (ref 12.0–15.0)
Immature Granulocytes: 2 %
Lymphocytes Relative: 2 %
Lymphs Abs: 0.4 10*3/uL — ABNORMAL LOW (ref 0.7–4.0)
MCH: 31.9 pg (ref 26.0–34.0)
MCHC: 32.7 g/dL (ref 30.0–36.0)
MCV: 97.6 fL (ref 80.0–100.0)
Monocytes Absolute: 1 10*3/uL (ref 0.1–1.0)
Monocytes Relative: 6 %
Neutro Abs: 16.5 10*3/uL — ABNORMAL HIGH (ref 1.7–7.7)
Neutrophils Relative %: 89 %
Platelets: 142 10*3/uL — ABNORMAL LOW (ref 150–400)
RBC: 3.32 MIL/uL — ABNORMAL LOW (ref 3.87–5.11)
RDW: 13 % (ref 11.5–15.5)
WBC: 18.4 10*3/uL — ABNORMAL HIGH (ref 4.0–10.5)
nRBC: 0 % (ref 0.0–0.2)

## 2021-03-09 LAB — CORTISOL-AM, BLOOD: Cortisol - AM: 54.3 ug/dL — ABNORMAL HIGH (ref 6.7–22.6)

## 2021-03-09 LAB — PROTIME-INR
INR: 1.2 (ref 0.8–1.2)
Prothrombin Time: 15.2 seconds (ref 11.4–15.2)

## 2021-03-09 LAB — GLUCOSE, CAPILLARY
Glucose-Capillary: 194 mg/dL — ABNORMAL HIGH (ref 70–99)
Glucose-Capillary: 391 mg/dL — ABNORMAL HIGH (ref 70–99)
Glucose-Capillary: 215 mg/dL — ABNORMAL HIGH (ref 70–99)
Glucose-Capillary: 209 mg/dL — ABNORMAL HIGH (ref 70–99)

## 2021-03-09 LAB — PROCALCITONIN: Procalcitonin: 30.61 ng/mL

## 2021-03-09 LAB — HEMOGLOBIN A1C
Hgb A1c MFr Bld: 6.6 % — ABNORMAL HIGH (ref 4.8–5.6)
Mean Plasma Glucose: 143 mg/dL

## 2021-03-09 LAB — MAGNESIUM: Magnesium: 2 mg/dL (ref 1.7–2.4)

## 2021-03-09 MED ORDER — FLUTICASONE PROPIONATE 50 MCG/ACT NA SUSP
2.0000 | Freq: Every day | NASAL | Status: AC
  Administered 2021-03-09 – 2021-03-12 (×2): 2 via NASAL
  Filled 2021-03-09: qty 16

## 2021-03-09 MED ORDER — CARBAMAZEPINE ER 200 MG PO TB12
200.0000 mg | ORAL_TABLET | Freq: Every day | ORAL | Status: DC
  Administered 2021-03-09 – 2021-04-03 (×24): 200 mg via ORAL
  Filled 2021-03-09 (×26): qty 1

## 2021-03-09 MED ORDER — ONDANSETRON HCL 4 MG/2ML IJ SOLN
4.0000 mg | Freq: Three times a day (TID) | INTRAMUSCULAR | Status: DC | PRN
  Administered 2021-03-09: 4 mg via INTRAVENOUS
  Filled 2021-03-09: qty 2

## 2021-03-09 MED ORDER — HYDRALAZINE HCL 25 MG PO TABS: 25.0000 mg | ORAL_TABLET | ORAL | Status: DC | PRN

## 2021-03-09 MED ORDER — LIVING WELL WITH DIABETES BOOK
Freq: Once | Status: AC
  Filled 2021-03-09: qty 1

## 2021-03-09 MED ORDER — SODIUM CHLORIDE 0.9 % IV SOLN
3.0000 g | Freq: Two times a day (BID) | INTRAVENOUS | Status: DC
  Administered 2021-03-09 – 2021-03-10 (×3): 3 g via INTRAVENOUS
  Filled 2021-03-09 (×4): qty 8

## 2021-03-09 MED ORDER — LORAZEPAM 2 MG/ML IJ SOLN
2.0000 mg | Freq: Once | INTRAMUSCULAR | Status: AC | PRN
Start: 2021-03-09 — End: 2021-03-09
  Administered 2021-03-09: 2 mg via INTRAVENOUS
  Filled 2021-03-09: qty 1

## 2021-03-09 MED ORDER — LABETALOL HCL 5 MG/ML IV SOLN
10.0000 mg | INTRAVENOUS | Status: DC | PRN
  Administered 2021-03-09 – 2021-03-12 (×6): 10 mg via INTRAVENOUS
  Filled 2021-03-09 (×6): qty 4

## 2021-03-09 NOTE — Assessment & Plan Note (Addendum)
-   4/4 bottles positive on admission (1/22) with MSSA. Differential for etiology at this time includes recent outpt steroid injection on 1/20 to Left deltoid vs vertebral infection (worsening lower back pain) vs spontaneous bacteremia vs translocation from pulmonary source - abx changed to Unasyn which should cover presumed sources for now - obtain MRI left shoulder. ID has ordered MRI T/L spine.  Unfortunately, patient too agitated to stay still, so trying to reorder with general anesthesia.   - TTE on 1/23: some poor images but no obvious vegetations (EF 60-65%, mild LVH).   Blood cultures from 1/24, 1/25 = negative

## 2021-03-09 NOTE — Progress Notes (Signed)
Inpatient Diabetes Program Recommendations  AACE/ADA: New Consensus Statement on Inpatient Glycemic Control (2015)  Target Ranges:  Prepandial:   less than 140 mg/dL      Peak postprandial:   less than 180 mg/dL (1-2 hours)      Critically ill patients:  140 - 180 mg/dL   Lab Results  Component Value Date   GLUCAP 391 (H) 03/09/2021    Review of Glycemic Control  Latest Reference Range & Units 03/09/21 08:14 03/09/21 11:57  Glucose-Capillary 70 - 99 mg/dL 194 (H) 391 (H)  (H): Data is abnormally high  Diabetes history: Pre-DM Outpatient Diabetes medications: None Current orders for Inpatient glycemic control: Novolog 0-9 units TID and 0-5 units QHS  Inpatient Diabetes Program Recommendations:    Semglee 12 units QD (0.15 units/kg) Novolog 2-3 units TID with meals if eats at least 50%   Will continue to follow while inpatient.  Thank you, Reche Dixon, MSN, RN Diabetes Coordinator Inpatient Diabetes Program [phone] (team pager from 8a-5p)

## 2021-03-09 NOTE — TOC Initial Note (Signed)
Transition of Care [facility]) - Initial/Assessment Note    Patient Details  Name: [name] [name] MRN: [id number] Date of Birth: 1945/10/25  Transition of Care [facility]) CM/SW Contact:    Leeroy Cha, RN Phone Number: 03/09/2021, 8:22 AM  Clinical Narrative:                 Bld cul;ture + for GPC, wean dwn from 3l/min to room air.  Following for toc needs  Expected Discharge Plan: Home/Self Care Barriers to Discharge: Continued Medical Work up   Patient Goals and CMS Choice Patient states their goals for this hospitalization and ongoing recovery are:: to go home CMS Medicare.gov Compare Post Acute Care list provided to:: Patient    Expected Discharge Plan and Services Expected Discharge Plan: Home/Self Care   Discharge Planning Services: CM Consult   Living arrangements for the past 2 months: Single Family Home                                      Prior Living Arrangements/Services Living arrangements for the past 2 months: Single Family Home Lives with:: Spouse Patient language and need for interpreter reviewed:: Yes Do you feel safe going back to the place where you live?: Yes            Criminal Activity/Legal Involvement Pertinent to Current Situation/Hospitalization: No - Comment as needed  Activities of Daily Living      Permission Sought/Granted                  Emotional Assessment Appearance:: Appears stated age Attitude/Demeanor/Rapport: Engaged Affect (typically observed): Calm Orientation: : Oriented to Self, Oriented to Place, Oriented to  Time, Oriented to Situation Alcohol / Substance Use: Not Applicable Psych Involvement: No (comment)  Admission diagnosis:  Generalized weakness [R53.1] AKI (acute kidney injury) ([place]) [N17.9] Fall, initial encounter [W19.XXXA] Sepsis ([place]) [A41.9] Community acquired pneumonia, unspecified laterality [J18.9] Sepsis with acute renal failure without septic shock, due to unspecified organism,  unspecified acute renal failure type ([place]) [A41.9, R65.20, N17.9] Patient Active Problem List   Diagnosis Date Noted   Sepsis ([place]) 03/08/2021   Acute renal failure superimposed on stage 3b chronic kidney disease ([place]) 03/08/2021   Hyponatremia 03/08/2021   Elevated LFTs 03/08/2021   Hyperglycemia 03/08/2021   Normocytic anemia 03/08/2021   Fall at home, initial encounter 03/08/2021   Acute respiratory failure with hypoxia ([place]) 03/08/2021   Hypothyroidism 03/08/2021   Chronic diastolic heart failure ([place]) 01/01/2021   Nonrheumatic mitral valve regurgitation 01/01/2021   Stage 3a chronic kidney disease ([place]) 01/01/2021   Genetic testing 08/10/2019   Family history of breast cancer    Family history of colon cancer    Malignant neoplasm of upper-inner quadrant of right breast in female, estrogen receptor positive ([name] [name]) 07/30/2019   Osteopenia 09/10/2016   Vaginal atrophy 09/10/2015   Menopause 09/10/2015   PCP:  Janie Morning, DO Pharmacy:   [facility] [place], [address] AT [address] [place] [address] Phone: [phone] Fax: [phone]     Social Determinants of Health (SDOH) Interventions    Readmission Risk Interventions No flowsheet data found.

## 2021-03-09 NOTE — Progress Notes (Signed)
Progress Note    [name] [name]   BWG:[id number]  DOB: 1945-06-24  DOA: 03/08/2021     1 PCP: Janie Morning, DO  Initial CC: found on floor at home; back pain, L shoulder pain, weakness  Hospital Course: [name] [name] is a 76 yo female with PMH anxiety, dCHF, HLD, hypothyroidism, IBS, mood disorder, prediabetes, CKD 3b who presented to the ER after multiple falls at home.  She was found to be on the floor when EMS arrived.  Patient was a poor historian in the ER and unable to provide significant collateral information.  ER provider was able to speak with son over the phone who provided further information. The patient had been feeling well up until about 4 days ago when she began complaining of some burning pain in her back.  She presented to her PCP office and started on hydrocodone.  Apparently she has been at home with ongoing poor appetite and becoming more weak with less ambulation.  She somehow had fallen out of bed today and EMS was called by her son. On work-up in the ER she was found to be febrile, tachycardic, tachypneic, and hypoxic, and was placed on 4 L oxygen. Notable labs include  WBC 16.2, hemoglobin 10.5, platelets 137 Sodium 129, glucose 269, BUN 52, creatinine 2.47, albumin 2.8, AST 197, ALT 113, total bili 0.6, alk phos 64  Multiple imaging studies performed.  CXR notable for low lung volumes and no acute findings.  CT chest showed bilateral pleural effusions, bilateral lower lobe bronchial wall thickening with consolidations concerning for possible pneumonia.  Possible 7 mm left apical lung nodule. CT cervical spine negative for acute abnormalities.  Does show anterolisthesis C3 on C4 due to degenerative changes, no definitive fracture noted.  Advanced degenerative changes at multiple levels in the cervical spine.  CT head negative for acute abnormalities. Due to concern for possible pneumonia with aspiration she was started on vancomycin, cefepime, Flagyl and admitted for  further work-up and evaluation.  Shortly after admission 4/4 blood cultures became positive for MSSA.  She had noted bruising over her left shoulder with worsening pain and had undergone recent outpatient Kenalog injection in the left deltoid on 03/06/2021.  Interval History:  No events overnight.  Son and husband present this morning.  Son is able to provide further collateral information.  Also called and spoke with her primary care office. Her mentation is a little better today and thought process is slightly more organized but she still has some noted confusion which her son states has been present now for quite a while with mild worsening.  Assessment & Plan: * Sepsis ([place])- (present on admission) - Febrile, tachycardia, tachypnea, leukocytosis; presumed lung source given opacities on CT chest with possible aspiration however now having worsening left shoulder pain (s/p L deltoid kenalog injection on 03/06/21) and ongoing low back pain - Given vancomycin, cefepime, Flagyl in the ER - No significant risk factors for MDRO. She was then changed to CTX/azithro however after blood cultures grew MSSA, will change her to Unasyn for now and monitor further clinically - see bacteremia as well  MSSA Bacteremia - 4/4 bottles positive on admission (1/22) with MSSA. Differential for etiology at this time includes recent outpt steroid injection on 1/20 to Left deltoid vs vertebral infection (worsening lower back pain) vs spontaneous bacteremia vs translocation from pulmonary source - abx changed to Unasyn which should cover presumed sources for now - obtain MRI left shoulder. ID has ordered MRI T/L spine -  obtain echo - repeat blood cultures on 1/24 and monitor for clearance  Acute respiratory failure with hypoxia ([place]) - Presumed due to underlying pneumonia/aspiration -Continue oxygen and wean as able - See sepsis and bacteremia work-up otherwise  Acute renal failure superimposed on stage 3b chronic  kidney disease ([place]) - patient has history of CKD3b. Baseline creat ~ 1.3, eGFR 38 - patient presents with increase in creat >0.3 mg/dL above baseline, creat increase >1.5x baseline presumed to have occurred within past 7 days PTA -Creatinine 2.47 on admission - Patient appears overtly volume depleted with dry mucous membranes and reports of poor intake over the past couple days - continue on IVF and trend BMP  Hyponatremia-resolved as of 03/09/2021 - presumed hypovolemic hyponatremia - continue IVF - BMP in am   Fall at home, initial encounter - Patient found on floor by EMS on their arrival.  Patient states floor is carpeted although she cannot provide any further collateral information regarding her fall or length of time on the floor - Given renal failure on admission which is still likely prerenal, check CK - Multiple imaging studies on admission including right wrist x-ray, CT head, CT cervical spine, and CT chest: All with no acute abnormalities but does have 4 mm anterolisthesis C3 on C4 - continue pain control - will consult PT once more stable   Hyperglycemia - History of prediabetes, no A1c on file - Obtain A1c now - Treat with sliding scale for now  Chronic diastolic heart failure ([place])- (present on admission) - No S/S exacerbation on exam.  Patient is volume depleted clinically - Continue fluids -Last echo reviewed from 11/06/2020: Moderate MR, impaired relaxation, EF 65%  Hypothyroidism - TSH, 0.429 - continue Synthroid  Normocytic anemia - No complaints or signs of bleeding.  No recent lab work other than July 2021 (12.3 g/dL at that time) - Hemoglobin 10.5 g/dL on admission - Trend hemoglobin while on fluids as expect some further dilution as well  Elevated LFTs - differential includes cholestasis of sepsis vs hypoperfusion but not high enough for ischemic range though patient down on ground for short duration - trend LFTs for now; if worsen will further workup      Old records reviewed in assessment of this patient  Antimicrobials: Vanc, cefepime 1/22 x 1 Rocephin 1/22 >> 1/23 Flagyl 1/22 >> 1/23 Unasyn 1/23 >> current  DVT prophylaxis: HSQ  Code Status:   Code Status: Full Code  Disposition Plan:  Home Status is: Inpt  Objective: Blood pressure (!) 170/68, pulse (!) 115, temperature 99.2 F (37.3 C), temperature source Oral, resp. rate 18, weight 85.5 kg, SpO2 93 %.  Examination:  Physical Exam Constitutional:      Comments: Ill-appearing elderly woman resting in bed uncomfortable but NAD; some improvement in mentation  HENT:     Head: Normocephalic.     Comments: Bruising noted along right forehead    Mouth/Throat:     Mouth: Mucous membranes are dry.     Comments: Grossly dry mucous membranes noted in oral mucosa Eyes:     Extraocular Movements: Extraocular movements intact.  Cardiovascular:     Rate and Rhythm: Normal rate and regular rhythm.     Comments: Subtle 2/6 HSM noted in precordium Pulmonary:     Effort: Pulmonary effort is normal.     Comments: Clear sounds anteriorly Abdominal:     General: Bowel sounds are normal. There is no distension.     Palpations: Abdomen is soft.  Tenderness: There is no abdominal tenderness.  Musculoskeletal:     Cervical back: Normal range of motion and neck supple.     Comments: 1+ bilateral lower extremity edema. Decreased passive ROM in left shoulder (large bruise on posterior deltoid) due to pain. Decreased passive ROM in right wrist/elbow  Skin:    General: Skin is warm and dry.  Neurological:     Comments: Follows commands and moves all 4 extremities. Some confusion still appreciated but improved.  Psychiatric:     Comments: Disorganized thought process     Consultants:  ID  Procedures:    Data Reviewed: I have personally reviewed labs and imaging studies    LOS: 1 day  Time spent: Greater than 50% of the 35 minute visit was spent in counseling/coordination  of care for the patient as laid out in the A&P.   Dwyane Dee, MD [facility] 03/09/2021, 2:30 PM

## 2021-03-09 NOTE — Consult Note (Addendum)
[facility] for Infectious Disease       Reason for Consult: bacteremia    Referring Physician: CHAMP autoconsult  Principal Problem:   Sepsis ([place]) Active Problems:   Chronic diastolic heart failure ([place])   Acute renal failure superimposed on stage 3b chronic kidney disease (HCC)   Hyponatremia   Elevated LFTs   Hyperglycemia   Normocytic anemia   Fall at home, initial encounter   Acute respiratory failure with hypoxia (HCC)   Hypothyroidism    fluticasone  2 spray Each Nare Daily   heparin  5,000 Units Subcutaneous Q8H   insulin aspart  0-5 Units Subcutaneous QHS   insulin aspart  0-9 Units Subcutaneous TID WC   levothyroxine  88 mcg Oral Q0600   mouth rinse  15 mL Mouth Rinse BID   sodium chloride flush  3 mL Intravenous Q12H    Recommendations: MRI spine MRI shoulder order noted Blood cultures and TTE ordered Continue with ampicillin/sulbactam  Assessment: She has bacteremia with Staph aureus, leukocytosis and fever associated with lower extremity weakness and shoulder pain concerning for deep spinal infection.  Also general concern for endocarditis.    Antibiotics: Has been on Vancomycin, cefepime, metronidazole  HPI: Wayne Brunker is a 76 y.o. female with a history of dCHF, hyperlipidemia, hypothyroidism, chronic kidney disease who had come in to the ED due to recent falls.  She gives a history of worsening weakness of her lower extremities bilaterally and feeling poorly.   She was not otherwise able to give much history.  She had gone to her PCPs office and given hydrocodone for back pain.  Her initial blood cultures now positive for GPC in 4/4 bottles with Staph aureus on BCID.  Tmax of 102.5 and WBC of 18.4.  her main complaint to me is feeling hot.     Review of Systems:  Unable to be assessed due to patient factors - she could not give further history   Past Medical History:  Diagnosis Date   Anxiety    being weaned off lithium will be off on  06/23/11   Bipolar disorder ([place])    Cancer ([place]) 07/2019   right breast IDC with DCIS   Diastolic heart failure ([place])    Diverticulosis 09/01/2011   mild, left sided   Family history of breast cancer    Family history of colon cancer    Hyperlipidemia    Hypothyroidism    IBS (irritable bowel syndrome)    Leg cramps    Mood disorder ([place])    Morbidly obese (HCC)    Numbness and tingling in left arm    Osteopenia    Prediabetes    Stage 3 chronic kidney disease (HCC)    CKD stg 3   Vitamin D deficiency     Social History   Tobacco Use   Smoking status: Never   Smokeless tobacco: Never  Vaping Use   Vaping Use: Never used  Substance Use Topics   Alcohol use: No    Alcohol/week: 0.0 standard drinks   Drug use: No    Family History  Problem Relation Age of Onset   Diabetes Mother    Other Mother        meningioma (multiple)   Colon cancer Father 51   Cancer - Colon Father 40       died 50   Parkinson's disease Sister 65   Alzheimer's disease Maternal Grandfather    Other Daughter  tiny meningioma outside of the brain   Breast cancer Cousin 76       maternal cousin   Ovarian cancer Neg Hx    Colon polyps Neg Hx     No Known Allergies  Physical Exam: Constitutional: alert, nad Vitals:   03/09/21 0959 03/09/21 1158  BP: (!) 164/66 (!) 162/72  Pulse: (!) 120 (!) 118  Resp: 18 16  Temp:  99.3 F (37.4 C)  SpO2: 94% 94%   EYES: anicteric ENMT: no thrush Cardiovascular: RRR Respiratory: CTA B; normal respiratory effort Musculoskeletal: no pedal edema noted Skin: no rash Neuro: non-focal  Lab Results  Component Value Date   WBC 18.4 (H) 03/09/2021   HGB 10.6 (L) 03/09/2021   HCT 32.4 (L) 03/09/2021   MCV 97.6 03/09/2021   PLT 142 (L) 03/09/2021    Lab Results  Component Value Date   CREATININE 1.78 (H) 03/09/2021   BUN 42 (H) 03/09/2021   NA 135 03/09/2021   K 4.0 03/09/2021   CL 102 03/09/2021   CO2 23 03/09/2021    Lab Results   Component Value Date   ALT 113 (H) 03/08/2021   AST 197 (H) 03/08/2021   ALKPHOS 64 03/08/2021     Microbiology: Recent Results (from the past 240 hour(s))  Blood Culture (routine x 2)     Status: None (Preliminary result)   Collection Time: 03/08/21  2:37 PM   Specimen: BLOOD  Result Value Ref Range Status   Specimen Description   Final    BLOOD LEFT ANTECUBITAL Performed at [facility], [place] [address], [place], [place] [address]    Special Requests   Final    BOTTLES DRAWN AEROBIC AND ANAEROBIC Blood Culture results may not be optimal due to an excessive volume of blood received in culture bottles Performed at [facility] [address], [place], [place] [address]    Culture  Setup Time   Final    GRAM POSITIVE COCCI IN BOTH AEROBIC AND ANAEROBIC BOTTLES CRITICAL RESULT CALLED TO, READ BACK BY AND VERIFIED WITH: M LILLISTON,PHARMD@[id number]  03/09/21 [place]    Culture   Final    NO GROWTH < 24 HOURS Performed at [facility], [place] [address], [place], [place] [address]    Report Status PENDING  Incomplete  Blood Culture ID Panel (Reflexed)     Status: Abnormal   Collection Time: 03/08/21  2:37 PM  Result Value Ref Range Status   Enterococcus faecalis NOT DETECTED NOT DETECTED Final   Enterococcus Faecium NOT DETECTED NOT DETECTED Final   Listeria monocytogenes NOT DETECTED NOT DETECTED Final   Staphylococcus species DETECTED (A) NOT DETECTED Final    Comment: CRITICAL RESULT CALLED TO, READ BACK BY AND VERIFIED WITH: M LILLISTON,PHARMD@[id number]  03/09/21 [place]    Staphylococcus aureus (BCID) DETECTED (A) NOT DETECTED Final    Comment: CRITICAL RESULT CALLED TO, READ BACK BY AND VERIFIED WITH: M LILLISTON,PHARMD@[id number]  03/09/21 [place]    Staphylococcus epidermidis NOT DETECTED NOT DETECTED Final   Staphylococcus lugdunensis NOT DETECTED NOT DETECTED Final   Streptococcus species NOT DETECTED NOT DETECTED Final   Streptococcus agalactiae NOT  DETECTED NOT DETECTED Final   Streptococcus pneumoniae NOT DETECTED NOT DETECTED Final   Streptococcus pyogenes NOT DETECTED NOT DETECTED Final   A.calcoaceticus-baumannii NOT DETECTED NOT DETECTED Final   Bacteroides fragilis NOT DETECTED NOT DETECTED Final   Enterobacterales NOT DETECTED NOT DETECTED Final   Enterobacter cloacae complex NOT DETECTED NOT DETECTED Final  Escherichia coli NOT DETECTED NOT DETECTED Final   Klebsiella aerogenes NOT DETECTED NOT DETECTED Final   Klebsiella oxytoca NOT DETECTED NOT DETECTED Final   Klebsiella pneumoniae NOT DETECTED NOT DETECTED Final   Proteus species NOT DETECTED NOT DETECTED Final   Salmonella species NOT DETECTED NOT DETECTED Final   Serratia marcescens NOT DETECTED NOT DETECTED Final   Haemophilus influenzae NOT DETECTED NOT DETECTED Final   Neisseria meningitidis NOT DETECTED NOT DETECTED Final   Pseudomonas aeruginosa NOT DETECTED NOT DETECTED Final   Stenotrophomonas maltophilia NOT DETECTED NOT DETECTED Final   Candida albicans NOT DETECTED NOT DETECTED Final   Candida auris NOT DETECTED NOT DETECTED Final   Candida glabrata NOT DETECTED NOT DETECTED Final   Candida krusei NOT DETECTED NOT DETECTED Final   Candida parapsilosis NOT DETECTED NOT DETECTED Final   Candida tropicalis NOT DETECTED NOT DETECTED Final   Cryptococcus neoformans/gattii NOT DETECTED NOT DETECTED Final   Meth resistant mecA/C and MREJ NOT DETECTED NOT DETECTED Final    Comment: Performed at [facility], [place] [address], [place], [place] [address]  Resp Panel by RT-PCR (Flu A&B, Covid) Nasopharyngeal Swab     Status: None   Collection Time: 03/08/21  3:00 PM   Specimen: Nasopharyngeal Swab; Nasopharyngeal(NP) swabs in vial transport medium  Result Value Ref Range Status   SARS Coronavirus 2 by RT PCR NEGATIVE NEGATIVE Final    Comment: (NOTE) SARS-CoV-2 target nucleic acids are NOT DETECTED.  The SARS-CoV-2 RNA is generally detectable in upper  respiratory specimens during the acute phase of infection. The lowest concentration of SARS-CoV-2 viral copies this assay can detect is 138 copies/mL. A negative result does not preclude SARS-Cov-2 infection and should not be used as the sole basis for treatment or other patient management decisions. A negative result may occur with  improper specimen collection/handling, submission of specimen other than nasopharyngeal swab, presence of viral mutation(s) within the areas targeted by this assay, and inadequate number of viral copies(<138 copies/mL). A negative result must be combined with clinical observations, patient history, and epidemiological information. The expected result is Negative.  Fact Sheet for Patients:  EntrepreneurPulse.com.au  Fact Sheet for Healthcare Providers:  IncredibleEmployment.be  This test is no t yet approved or cleared by the [place] FDA and  has been authorized for detection and/or diagnosis of SARS-CoV-2 by FDA under an Emergency Use Authorization (EUA). This EUA will remain  in effect (meaning this test can be used) for the duration of the COVID-19 declaration under Section 564(b)(1) of the Act, 21 U.S.C.section 360bbb-3(b)(1), unless the authorization is terminated  or revoked sooner.       Influenza A by PCR NEGATIVE NEGATIVE Final   Influenza B by PCR NEGATIVE NEGATIVE Final    Comment: (NOTE) The Xpert Xpress SARS-CoV-2/FLU/RSV plus assay is intended as an aid in the diagnosis of influenza from Nasopharyngeal swab specimens and should not be used as a sole basis for treatment. Nasal washings and aspirates are unacceptable for Xpert Xpress SARS-CoV-2/FLU/RSV testing.  Fact Sheet for Patients: EntrepreneurPulse.com.au  Fact Sheet for Healthcare Providers: IncredibleEmployment.be  This test is not yet approved or cleared by the [place] FDA and has been  authorized for detection and/or diagnosis of SARS-CoV-2 by FDA under an Emergency Use Authorization (EUA). This EUA will remain in effect (meaning this test can be used) for the duration of the COVID-19 declaration under Section 564(b)(1) of the Act, 21 U.S.C. section 360bbb-3(b)(1), unless the authorization is terminated or  revoked.  Performed at [facility], [place] [address], [place], [place] [address]   Blood Culture (routine x 2)     Status: None (Preliminary result)   Collection Time: 03/08/21  3:00 PM   Specimen: BLOOD  Result Value Ref Range Status   Specimen Description   Final    BLOOD RIGHT ANTECUBITAL Performed at [facility] [address] [place], [place] [address]    Special Requests   Final    BOTTLES DRAWN AEROBIC AND ANAEROBIC Blood Culture adequate volume Performed at [facility] [address] [place], [place] [address]    Culture  Setup Time   Final    GRAM POSITIVE COCCI IN BOTH AEROBIC AND ANAEROBIC BOTTLES CRITICAL VALUE NOTED.  VALUE IS CONSISTENT WITH PREVIOUSLY REPORTED AND CALLED VALUE.    Culture   Final    NO GROWTH < 24 HOURS Performed at [facility], [place] [address], [place], [place] [address]    Report Status PENDING  Incomplete    Thayer Headings, [facility] for Infectious Disease [place] [email or web address] 03/09/2021, 1:11 PM

## 2021-03-09 NOTE — Progress Notes (Incomplete)
Echocardiogram 2D Echocardiogram has been performed.  Jefferey Pica 03/09/2021, 3:26 PM

## 2021-03-09 NOTE — Progress Notes (Signed)
PHARMACY - PHYSICIAN COMMUNICATION CRITICAL VALUE ALERT - BLOOD CULTURE IDENTIFICATION (BCID)  Atticus Clonch is an 76 y.o. female who presented to [facility] on 03/08/2021 with a chief complaint of s/p fall.  Assessment:   Patient reports burning in her back that started ~ 4 days prior to admission & multiple falls at home.  Admit with sepsis & possible PNA per chest CT 1/22 currently on antibiotic coverage for CAP/aspiration PNA.  Fever has resolved.  AKI on admission- improving.  Scr 1.78 this morning (est CrCl ~58ml/min) Blood cx now + GPCC in all 4 bottles; BCID+ MSSA.   Name of physician (or Provider) Contacted: [place]  Current antibiotics: Rocephin + Zithromax + Flagyl  Changes to prescribed antibiotics recommended:  Unasyn 3gm IV q12h for CrCl <30 for PNA + MSSA bacteremia coverage ID service will be auto-consulted; follow for recommendations F/U renal fxn & final cx data  Results for orders placed or performed during the hospital encounter of 03/08/21  Blood Culture ID Panel (Reflexed) (Collected: 03/08/2021  2:37 PM)  Result Value Ref Range   Enterococcus faecalis NOT DETECTED NOT DETECTED   Enterococcus Faecium NOT DETECTED NOT DETECTED   Listeria monocytogenes NOT DETECTED NOT DETECTED   Staphylococcus species DETECTED (A) NOT DETECTED   Staphylococcus aureus (BCID) DETECTED (A) NOT DETECTED   Staphylococcus epidermidis NOT DETECTED NOT DETECTED   Staphylococcus lugdunensis NOT DETECTED NOT DETECTED   Streptococcus species NOT DETECTED NOT DETECTED   Streptococcus agalactiae NOT DETECTED NOT DETECTED   Streptococcus pneumoniae NOT DETECTED NOT DETECTED   Streptococcus pyogenes NOT DETECTED NOT DETECTED   A.calcoaceticus-baumannii NOT DETECTED NOT DETECTED   Bacteroides fragilis NOT DETECTED NOT DETECTED   Enterobacterales NOT DETECTED NOT DETECTED   Enterobacter cloacae complex NOT DETECTED NOT DETECTED   Escherichia coli NOT DETECTED NOT DETECTED   Klebsiella  aerogenes NOT DETECTED NOT DETECTED   Klebsiella oxytoca NOT DETECTED NOT DETECTED   Klebsiella pneumoniae NOT DETECTED NOT DETECTED   Proteus species NOT DETECTED NOT DETECTED   Salmonella species NOT DETECTED NOT DETECTED   Serratia marcescens NOT DETECTED NOT DETECTED   Haemophilus influenzae NOT DETECTED NOT DETECTED   Neisseria meningitidis NOT DETECTED NOT DETECTED   Pseudomonas aeruginosa NOT DETECTED NOT DETECTED   Stenotrophomonas maltophilia NOT DETECTED NOT DETECTED   Candida albicans NOT DETECTED NOT DETECTED   Candida auris NOT DETECTED NOT DETECTED   Candida glabrata NOT DETECTED NOT DETECTED   Candida krusei NOT DETECTED NOT DETECTED   Candida parapsilosis NOT DETECTED NOT DETECTED   Candida tropicalis NOT DETECTED NOT DETECTED   Cryptococcus neoformans/gattii NOT DETECTED NOT DETECTED   Meth resistant mecA/C and MREJ NOT DETECTED NOT DETECTED    Netta Cedars PharmD 03/09/2021  6:24 AM

## 2021-03-10 ENCOUNTER — Inpatient Hospital Stay (HOSPITAL_COMMUNITY): Payer: Medicare Other

## 2021-03-10 DIAGNOSIS — N179 Acute kidney failure, unspecified: Secondary | ICD-10-CM | POA: Diagnosis not present

## 2021-03-10 DIAGNOSIS — R7881 Bacteremia: Secondary | ICD-10-CM

## 2021-03-10 DIAGNOSIS — A419 Sepsis, unspecified organism: Secondary | ICD-10-CM | POA: Diagnosis not present

## 2021-03-10 DIAGNOSIS — E1122 Type 2 diabetes mellitus with diabetic chronic kidney disease: Secondary | ICD-10-CM

## 2021-03-10 DIAGNOSIS — J9601 Acute respiratory failure with hypoxia: Secondary | ICD-10-CM | POA: Diagnosis not present

## 2021-03-10 DIAGNOSIS — W19XXXA Unspecified fall, initial encounter: Secondary | ICD-10-CM | POA: Diagnosis not present

## 2021-03-10 LAB — CBC WITH DIFFERENTIAL/PLATELET
Abs Immature Granulocytes: 1.03 10*3/uL — ABNORMAL HIGH (ref 0.00–0.07)
Basophils Absolute: 0 10*3/uL (ref 0.0–0.1)
Basophils Relative: 0 %
Eosinophils Absolute: 0 10*3/uL (ref 0.0–0.5)
Eosinophils Relative: 0 %
HCT: 31.2 % — ABNORMAL LOW (ref 36.0–46.0)
Hemoglobin: 10.2 g/dL — ABNORMAL LOW (ref 12.0–15.0)
Immature Granulocytes: 5 %
Lymphocytes Relative: 3 %
Lymphs Abs: 0.7 10*3/uL (ref 0.7–4.0)
MCH: 31.7 pg (ref 26.0–34.0)
MCHC: 32.7 g/dL (ref 30.0–36.0)
MCV: 96.9 fL (ref 80.0–100.0)
Monocytes Absolute: 1.1 10*3/uL — ABNORMAL HIGH (ref 0.1–1.0)
Monocytes Relative: 5 %
Neutro Abs: 18.3 10*3/uL — ABNORMAL HIGH (ref 1.7–7.7)
Neutrophils Relative %: 87 %
Platelets: 157 10*3/uL (ref 150–400)
RBC: 3.22 MIL/uL — ABNORMAL LOW (ref 3.87–5.11)
RDW: 13.4 % (ref 11.5–15.5)
WBC: 21.1 10*3/uL — ABNORMAL HIGH (ref 4.0–10.5)
nRBC: 0 % (ref 0.0–0.2)

## 2021-03-10 LAB — COMPREHENSIVE METABOLIC PANEL
ALT: 89 U/L — ABNORMAL HIGH (ref 0–44)
AST: 90 U/L — ABNORMAL HIGH (ref 15–41)
Albumin: 2.2 g/dL — ABNORMAL LOW (ref 3.5–5.0)
Alkaline Phosphatase: 68 U/L (ref 38–126)
Anion gap: 10 (ref 5–15)
BUN: 46 mg/dL — ABNORMAL HIGH (ref 8–23)
CO2: 22 mmol/L (ref 22–32)
Calcium: 8.9 mg/dL (ref 8.9–10.3)
Chloride: 108 mmol/L (ref 98–111)
Creatinine, Ser: 1.67 mg/dL — ABNORMAL HIGH (ref 0.44–1.00)
GFR, Estimated: 32 mL/min — ABNORMAL LOW (ref >60)
Glucose, Bld: 182 mg/dL — ABNORMAL HIGH (ref 70–99)
Potassium: 4.7 mmol/L (ref 3.5–5.1)
Sodium: 140 mmol/L (ref 135–145)
Total Bilirubin: 1.2 mg/dL (ref 0.3–1.2)
Total Protein: 6.2 g/dL — ABNORMAL LOW (ref 6.5–8.1)

## 2021-03-10 LAB — GLUCOSE, CAPILLARY
Glucose-Capillary: 179 mg/dL — ABNORMAL HIGH (ref 70–99)
Glucose-Capillary: 179 mg/dL — ABNORMAL HIGH (ref 70–99)
Glucose-Capillary: 201 mg/dL — ABNORMAL HIGH (ref 70–99)
Glucose-Capillary: 171 mg/dL — ABNORMAL HIGH (ref 70–99)
Glucose-Capillary: 177 mg/dL — ABNORMAL HIGH (ref 70–99)

## 2021-03-10 LAB — MAGNESIUM: Magnesium: 2.3 mg/dL (ref 1.7–2.4)

## 2021-03-10 MED ORDER — HYDRALAZINE HCL 20 MG/ML IJ SOLN
10.0000 mg | INTRAMUSCULAR | Status: DC | PRN
  Administered 2021-03-17: 10 mg via INTRAVENOUS
  Filled 2021-03-10: qty 1

## 2021-03-10 MED ORDER — CEFAZOLIN SODIUM-DEXTROSE 2-4 GM/100ML-% IV SOLN
2.0000 g | Freq: Once | INTRAVENOUS | Status: AC
  Administered 2021-03-10: 17:00:00 2 g via INTRAVENOUS
  Filled 2021-03-10 (×2): qty 100

## 2021-03-10 MED ORDER — HYDRALAZINE HCL 20 MG/ML IJ SOLN
5.0000 mg | Freq: Once | INTRAMUSCULAR | Status: AC
  Administered 2021-03-10: 04:00:00 5 mg via INTRAVENOUS
  Filled 2021-03-10: qty 1

## 2021-03-10 MED ORDER — CEFAZOLIN SODIUM-DEXTROSE 2-4 GM/100ML-% IV SOLN
2.0000 g | Freq: Two times a day (BID) | INTRAVENOUS | Status: DC
  Administered 2021-03-11: 01:00:00 2 g via INTRAVENOUS
  Filled 2021-03-10: qty 100

## 2021-03-10 MED ORDER — HALOPERIDOL LACTATE 5 MG/ML IJ SOLN
5.0000 mg | Freq: Once | INTRAMUSCULAR | Status: AC | PRN
  Administered 2021-03-10: 23:00:00 5 mg via INTRAVENOUS
  Filled 2021-03-10: qty 1

## 2021-03-10 MED ORDER — LORAZEPAM 2 MG/ML IJ SOLN
2.0000 mg | Freq: Once | INTRAMUSCULAR | Status: AC | PRN
  Administered 2021-03-10: 22:00:00 2 mg via INTRAVENOUS
  Filled 2021-03-10: qty 1

## 2021-03-10 NOTE — Progress Notes (Signed)
°Progress Note ° ° ° °[name] [name]   °MRN:[id number]  °DOB: 10/24/1945  °DOA: 03/08/2021     2 °PCP: [name] [name], Dana, DO ° °Initial CC: found on floor at home; back pain, L shoulder pain, weakness ° °Hospital Course: °Ms. [name] [name] is a 75 yo female with PMH anxiety, dCHF, HLD, hypothyroidism, IBS, mood disorder, prediabetes, CKD 3b who presented to the ER after multiple falls at home.  She was found to be on the floor when EMS arrived.  Patient was a poor historian in the ER and unable to provide significant collateral information.  ER provider was able to speak with son over the phone who provided further information. °The patient had been feeling well up until about 4 days ago when she began complaining of some burning pain in her back.  She presented to her PCP office and started on hydrocodone.  Apparently she has been at home with ongoing poor appetite and becoming more weak with less ambulation.  She somehow had fallen out of bed today and EMS was called by her son. °On work-up in the ER she was found to be febrile, tachycardic, tachypneic, and hypoxic, and was placed on 4 L oxygen. °Notable labs include  °WBC 16.2, hemoglobin 10.5, platelets 137 °Sodium 129, glucose 269, BUN 52, creatinine 2.47, albumin 2.8, AST 197, ALT 113, total bili 0.6, alk phos 64 ° °Multiple imaging studies performed.  CXR notable for low lung volumes and no acute findings.  CT chest showed bilateral pleural effusions, bilateral lower lobe bronchial wall thickening with consolidations concerning for possible pneumonia.  Possible 7 mm left apical lung nodule. °CT cervical spine negative for acute abnormalities.  Does show anterolisthesis C3 on C4 due to degenerative changes, no definitive fracture noted.  Advanced degenerative changes at multiple levels in the cervical spine.  CT head negative for acute abnormalities. °Due to concern for possible pneumonia with aspiration she was started on vancomycin, cefepime, Flagyl and admitted for  further work-up and evaluation. ° °Shortly after admission 4/4 blood cultures became positive for MSSA.  She had noted bruising over her left shoulder with worsening pain and had undergone recent outpatient Kenalog injection in the left deltoid on 03/06/2021. ° °Interval History:  °Unable to tolerate MRI yesterday.  Reattempting again today.  °She was lethargic when seen this morning but easily arousable and still about as confused as she has been. ° °Assessment & Plan: °* Sepsis (HCC)- (present on admission) °- Febrile, tachycardia, tachypnea, leukocytosis; presumed lung source given opacities on CT chest with possible aspiration however now having worsening left shoulder pain (s/p L deltoid kenalog injection on 03/06/21) and ongoing low back pain °- Given vancomycin, cefepime, Flagyl in the ER °- No significant risk factors for MDRO. She was then changed to CTX/azithro however after blood cultures grew MSSA, will change her to Unasyn for now and monitor further clinically °- see bacteremia as well ° °MSSA Bacteremia °- 4/4 bottles positive on admission (1/22) with MSSA. Differential for etiology at this time includes recent outpt steroid injection on 1/20 to Left deltoid vs vertebral infection (worsening lower back pain) vs spontaneous bacteremia vs translocation from pulmonary source °- abx changed to Unasyn which should cover presumed sources for now °- obtain MRI left shoulder. ID has ordered MRI T/L spine °- TTE on 1/23: some poor images but no obvious vegetations (EF 60-65%, mild LVH) °- repeated blood cultures on 1/24 but I'm not confident yet they were drawn correctly (follow up in epic once further   status updates) °- follow up MRI results (T/L spine and left shoulder) ° °Acute respiratory failure with hypoxia (HCC) °- Presumed due to underlying pneumonia/aspiration °-Continue oxygen and wean as able °- See sepsis and bacteremia work-up otherwise ° °Acute renal failure superimposed on stage 3b chronic kidney  disease (HCC) °- patient has history of CKD3b. Baseline creat ~ 1.3, eGFR 38 °- patient presents with increase in creat >0.3 mg/dL above baseline, creat increase >1.5x baseline presumed to have occurred within past 7 days PTA °-Creatinine 2.47 on admission °- Patient appears overtly volume depleted with dry mucous membranes and reports of poor intake over the past couple days °- continue on IVF and trend BMP °- creatinine downtrending  ° °Hyponatremia-resolved as of 03/09/2021 °- presumed hypovolemic hyponatremia °- continue IVF °- BMP in am  ° °Fall at home, initial encounter °- Patient found on floor by EMS on their arrival.  Patient states floor is carpeted although she cannot provide any further collateral information regarding her fall or length of time on the floor °- Given renal failure on admission which is still likely prerenal, check CK °- Multiple imaging studies on admission including right wrist x-ray, CT head, CT cervical spine, and CT chest: All with no acute abnormalities but does have 4 mm anterolisthesis C3 on C4 °- continue pain control °- will consult PT once more stable  ° °DMII (diabetes mellitus, type 2) (HCC) °- History of prediabetes, no A1c on file °- A1c on admission 6.6% °- Treat with sliding scale for now and diet control  ° °Chronic diastolic heart failure (HCC)- (present on admission) °- No S/S exacerbation on exam.  Patient is volume depleted clinically °- Continue fluids °-Last echo reviewed from 11/06/2020: Moderate MR, impaired relaxation, EF 65% °- TTE repeated on 1/23, EF 60-65%, mild LVH; trivial MR on read; normal diastology  ° °Hypothyroidism °- TSH, 0.429 °- continue Synthroid ° °Normocytic anemia °- No complaints or signs of bleeding.  No recent lab work other than July 2021 (12.3 g/dL at that time) °- Hemoglobin 10.5 g/dL on admission °- Trend hemoglobin while on fluids as expect some further dilution as well ° °Elevated LFTs °- differential includes cholestasis of sepsis vs  hypoperfusion but not high enough for ischemic range though patient down on ground for short duration °- trend LFTs for now; if worsen will further workup °- continues to downtrend   ° ° ° °Old records reviewed in assessment of this patient ° °Antimicrobials: °Vanc, cefepime 1/22 x 1 °Rocephin 1/22 >> 1/23 °Flagyl 1/22 >> 1/23 °Unasyn 1/23 >> current ° °DVT prophylaxis: HSQ ° °Code Status:   Code Status: Full Code ° °Disposition Plan:  Home °Status is: Inpt ° °Objective: °Blood pressure (!) 148/86, pulse (!) 109, temperature (!) 97.5 °F (36.4 °C), temperature source Oral, resp. rate 20, weight 85.5 kg, SpO2 93 %.  °Examination:  °Physical Exam °Constitutional:   °   Comments: Ill-appearing elderly woman resting in bed uncomfortable but NAD; some improvement in mentation  °HENT:  °   Head: Normocephalic.  °   Comments: Bruising noted along right forehead °   Mouth/Throat:  °   Mouth: Mucous membranes are dry.  °   Comments: Grossly dry mucous membranes noted in oral mucosa °Eyes:  °   Extraocular Movements: Extraocular movements intact.  °Cardiovascular:  °   Rate and Rhythm: Normal rate and regular rhythm.  °   Comments: Subtle 2/6 HSM noted in precordium °Pulmonary:  °   Effort:   Pulmonary effort is normal.  °   Comments: Clear sounds anteriorly °Abdominal:  °   General: Bowel sounds are normal. There is no distension.  °   Palpations: Abdomen is soft.  °   Tenderness: There is no abdominal tenderness.  °Musculoskeletal:  °   Cervical back: Normal range of motion and neck supple.  °   Comments: 1+ bilateral lower extremity edema. Decreased passive ROM in left shoulder (large bruise on posterior deltoid) due to pain. Decreased passive ROM in right wrist/elbow. Ongoing edema in LUE notably hand/wrist  °Skin: °   General: Skin is warm and dry.  °Neurological:  °   Comments: Follows commands and moves all 4 extremities. Some confusion still appreciated but improved.  °Psychiatric:  °   Comments: Disorganized thought  process  °  ° °Consultants:  °ID ° °Procedures:  ° ° °Data Reviewed:  °I have Reviewed nursing notes, Vitals, and Lab results since pt's last encounter. Pertinent lab results WBC 21.1 with 5% bands, Hgb 10.2, creat 1.67 °I have ordered imaging studies MRI left shoulder. °I have reviewed the last note from ID and nursing staff,  °and discussed pt's care plan and test results with them. ° ° LOS: 2 days  ° ° ° , MD °[facility] °03/10/2021, 3:02 PM °

## 2021-03-10 NOTE — Clinical Note (Incomplete)
Patient has remained very lethargic throughout the day.

## 2021-03-10 NOTE — Progress Notes (Signed)
° ° °  OVERNIGHT PROGRESS REPORT  Notified by RN for patient unable to safely take PO med is with elevated BP in spite of medication. IV dose ordered for one administration    Gershon Cull MSNA MSN [place]

## 2021-03-10 NOTE — Progress Notes (Signed)
2nd attempt patient had Ativan and Haldol still combative, motion.unable to scan

## 2021-03-10 NOTE — Progress Notes (Signed)
Pharmacy Antibiotic Note  [name] [name] is a 76 y.o. female admitted on 03/08/2021 with MSSA bacteremia.  Pharmacy has been consulted for Cefazolin dosing.  The patient's renal function is at borderline for q12h and q8h dosing. AKI noted on admission (SCr 2.47) now resolving - SCr down to 1.67, known baseline appears to be 1.3-1.5.  Plan: - Cefazolin 2g IV x 1 dose now, then next dose in 8 hours followed by 2g IV every 12 hour standing doses until AM BMET - then hopefully will be able to transition to q8h - Will continue to follow renal function, ID work-up, and LOT plans.  Weight: 85.5 kg (188 lb 7.9 oz)  Temp (24hrs), Avg:98.8 F (37.1 C), Min:97.5 F (36.4 C), Max:99.5 F (37.5 C)  Recent Labs  Lab 03/08/21 1436 03/09/21 0351 03/10/21 0355  WBC 16.2* 18.4* 21.1*  CREATININE 2.47* 1.78* 1.67*  LATICACIDVEN 1.8  --   --     Estimated Creatinine Clearance: 28.3 mL/min (A) (by C-G formula based on SCr of 1.67 mg/dL (H)).    No Known Allergies  Antimicrobials this admission: Vancomycin 1/22 x 1 Metronidazole 1/22 >> 1/23 Cefepime 1/22 x 1 Ceftriaxone 1/22 x 1 Unasyn 1/23 >> 1/24 Cefazolin 1/24 >>  Dose adjustments this admission: N/a  Microbiology results: 1/22 COVID/flu >> neg 1/22 BCx >> Staph Aureus (BCID MSSA) 1/14 BCx >>  Thank you for allowing pharmacy to be a part of this patients care.  Alycia Rossetti, PharmD, BCPS Infectious Diseases Clinical Pharmacist 03/10/2021 4:10 PM   **Pharmacist phone directory can now be found on [email or web address] ([email or web address]).  Listed under [place].

## 2021-03-10 NOTE — Progress Notes (Signed)
PT Cancellation Note  Patient Details Name: Ogechi Kuehnel MRN: [id number] DOB: 06-12-1945   Cancelled Treatment:    Reason Eval/Treat Not Completed: Fatigue/lethargy limiting ability to participate, Nursing  with patient , reporting that patient is mumbling, not very alert. Will check back tomorrow.[place] Pager [phone] Office [phone]    [name] 03/10/2021, 5:07 PM

## 2021-03-10 NOTE — Progress Notes (Signed)
Patient back from MRI. MRI tech says they were unable to get a good MRI and it showed the same way it did yesterday. MD notified.

## 2021-03-10 NOTE — Progress Notes (Signed)
°    [facility] for Infectious Disease   Reason for visit: Follow up on bacteremia  Interval History: unable to get MRIs; fever curve trending down.    Physical Exam: Constitutional:  Vitals:   03/10/21 0643 03/10/21 1249  BP: 125/80 (!) 148/86  Pulse: (!) 103 (!) 109  Resp: 20 20  Temp:  (!) 97.5 F (36.4 C)  SpO2: 94% 93%   patient appears in NAD  Impression: bacteremia.  No issues with oxygenation.  Will stop ampicillin/sulbactam and change to cefazolin.    Plan: 1.  Cefazolin 2. MRIs when able 3. Repeat blood cultures  TEE not absolutely indicated if MRI shows a deep infection or unable to be done. I would then recommend prolonged treatment regardless.

## 2021-03-10 NOTE — Plan of Care (Signed)

## 2021-03-10 NOTE — Progress Notes (Signed)
Patient was given one time dose PRN Ativan prior to going down for MRI. MRI called and said that patient was not able to get through the first set of MRI's due to agitation even with the Ativan. Patient came back up from MRI and has been lethargic since then. Notified on call provider about this due to patient needing more PRN BP medication for patient's high BP and patient would not wake up enough to take the oral PRN hydralazine. On call provider gave a one time dose for IV hydralazine. Reached back out to the on call provider about patient not waking up enough to take routine morning Synthroid. Notified Charge RN to assess patient. Patient was able to state name and date of birth, and responded to painful stimuli. Checked CBG to be sure and it was 179. Passed along to dayshift nurse.

## 2021-03-11 DIAGNOSIS — G9341 Metabolic encephalopathy: Secondary | ICD-10-CM | POA: Diagnosis not present

## 2021-03-11 DIAGNOSIS — R7881 Bacteremia: Secondary | ICD-10-CM | POA: Diagnosis not present

## 2021-03-11 DIAGNOSIS — A4101 Sepsis due to Methicillin susceptible Staphylococcus aureus: Principal | ICD-10-CM

## 2021-03-11 DIAGNOSIS — N179 Acute kidney failure, unspecified: Secondary | ICD-10-CM | POA: Diagnosis not present

## 2021-03-11 LAB — CBC WITH DIFFERENTIAL/PLATELET
Abs Immature Granulocytes: 1.7 10*3/uL — ABNORMAL HIGH (ref 0.00–0.07)
Basophils Absolute: 0.1 10*3/uL (ref 0.0–0.1)
Basophils Relative: 1 %
Eosinophils Absolute: 0 10*3/uL (ref 0.0–0.5)
Eosinophils Relative: 0 %
HCT: 30.8 % — ABNORMAL LOW (ref 36.0–46.0)
Hemoglobin: 10.1 g/dL — ABNORMAL LOW (ref 12.0–15.0)
Immature Granulocytes: 8 %
Lymphocytes Relative: 4 %
Lymphs Abs: 1 10*3/uL (ref 0.7–4.0)
MCH: 31.5 pg (ref 26.0–34.0)
MCHC: 32.8 g/dL (ref 30.0–36.0)
MCV: 96 fL (ref 80.0–100.0)
Monocytes Absolute: 0.7 10*3/uL (ref 0.1–1.0)
Monocytes Relative: 3 %
Neutro Abs: 18.9 10*3/uL — ABNORMAL HIGH (ref 1.7–7.7)
Neutrophils Relative %: 84 %
Platelets: 204 10*3/uL (ref 150–400)
RBC: 3.21 MIL/uL — ABNORMAL LOW (ref 3.87–5.11)
RDW: 13.9 % (ref 11.5–15.5)
WBC: 22.4 10*3/uL — ABNORMAL HIGH (ref 4.0–10.5)
nRBC: 0 % (ref 0.0–0.2)

## 2021-03-11 LAB — COMPREHENSIVE METABOLIC PANEL
ALT: 94 U/L — ABNORMAL HIGH (ref 0–44)
AST: 106 U/L — ABNORMAL HIGH (ref 15–41)
Albumin: 1.9 g/dL — ABNORMAL LOW (ref 3.5–5.0)
Alkaline Phosphatase: 77 U/L (ref 38–126)
Anion gap: 10 (ref 5–15)
BUN: 54 mg/dL — ABNORMAL HIGH (ref 8–23)
CO2: 23 mmol/L (ref 22–32)
Calcium: 8.9 mg/dL (ref 8.9–10.3)
Chloride: 112 mmol/L — ABNORMAL HIGH (ref 98–111)
Creatinine, Ser: 1.41 mg/dL — ABNORMAL HIGH (ref 0.44–1.00)
GFR, Estimated: 39 mL/min — ABNORMAL LOW (ref >60)
Glucose, Bld: 188 mg/dL — ABNORMAL HIGH (ref 70–99)
Potassium: 3.6 mmol/L (ref 3.5–5.1)
Sodium: 145 mmol/L (ref 135–145)
Total Bilirubin: 0.9 mg/dL (ref 0.3–1.2)
Total Protein: 5.9 g/dL — ABNORMAL LOW (ref 6.5–8.1)

## 2021-03-11 LAB — BLOOD GAS, ARTERIAL
Acid-Base Excess: 1.2 mmol/L (ref 0.0–2.0)
Allens test (pass/fail): POSITIVE — AB
Bicarbonate: 23.8 mmol/L (ref 20.0–28.0)
Drawn by: 29503
O2 Saturation: 88.1 %
Patient temperature: 98.6
pCO2 arterial: 31.8 mmHg — ABNORMAL LOW (ref 32.0–48.0)
pH, Arterial: 7.487 — ABNORMAL HIGH (ref 7.350–7.450)
pO2, Arterial: 57.2 mmHg — ABNORMAL LOW (ref 83.0–108.0)

## 2021-03-11 LAB — GLUCOSE, CAPILLARY
Glucose-Capillary: 184 mg/dL — ABNORMAL HIGH (ref 70–99)
Glucose-Capillary: 159 mg/dL — ABNORMAL HIGH (ref 70–99)
Glucose-Capillary: 156 mg/dL — ABNORMAL HIGH (ref 70–99)
Glucose-Capillary: 170 mg/dL — ABNORMAL HIGH (ref 70–99)

## 2021-03-11 LAB — CULTURE, BLOOD (ROUTINE X 2): Special Requests: ADEQUATE

## 2021-03-11 LAB — AMMONIA: Ammonia: 36 umol/L — ABNORMAL HIGH (ref 9–35)

## 2021-03-11 LAB — MAGNESIUM: Magnesium: 2.2 mg/dL (ref 1.7–2.4)

## 2021-03-11 LAB — BRAIN NATRIURETIC PEPTIDE: B Natriuretic Peptide: 167.3 pg/mL — ABNORMAL HIGH (ref 0.0–100.0)

## 2021-03-11 MED ORDER — CHLORHEXIDINE GLUCONATE 0.12 % MT SOLN
15.0000 mL | Freq: Two times a day (BID) | OROMUCOSAL | Status: DC
  Administered 2021-03-11 – 2021-04-02 (×39): 15 mL via OROMUCOSAL
  Filled 2021-03-11 (×41): qty 15

## 2021-03-11 MED ORDER — CEFAZOLIN SODIUM-DEXTROSE 2-4 GM/100ML-% IV SOLN
2.0000 g | Freq: Three times a day (TID) | INTRAVENOUS | Status: DC
  Administered 2021-03-11 – 2021-03-14 (×10): 2 g via INTRAVENOUS
  Filled 2021-03-11 (×10): qty 100

## 2021-03-11 MED ORDER — ORAL CARE MOUTH RINSE
15.0000 mL | Freq: Two times a day (BID) | OROMUCOSAL | Status: DC
  Administered 2021-03-11 – 2021-04-02 (×34): 15 mL via OROMUCOSAL

## 2021-03-11 NOTE — Plan of Care (Signed)
°  Problem: Education: Goal: Knowledge of General Education information will improve Description: Including pain rating scale, medication(s)/side effects and non-pharmacologic comfort measures Outcome: Progressing   Problem: Health Behavior/Discharge Planning: Goal: Ability to manage health-related needs will improve Outcome: Progressing   Problem: Clinical Measurements: Goal: Ability to maintain clinical measurements within normal limits will improve Outcome: Progressing Goal: Will remain free from infection Outcome: Progressing   Problem: Activity: Goal: Risk for activity intolerance will decrease Outcome: Progressing   Problem: Nutrition: Goal: Adequate nutrition will be maintained Outcome: Progressing   Problem: Coping: Goal: Level of anxiety will decrease Outcome: Progressing   Problem: Elimination: Goal: Will not experience complications related to bowel motility Outcome: Progressing Goal: Will not experience complications related to urinary retention Outcome: Progressing   Problem: Pain Managment: Goal: General experience of comfort will improve Outcome: Progressing   Problem: Safety: Goal: Ability to remain free from injury will improve Outcome: Progressing   Problem: Skin Integrity: Goal: Risk for impaired skin integrity will decrease Outcome: Progressing   Problem: Fluid Volume: Goal: Hemodynamic stability will improve Outcome: Progressing   Problem: Clinical Measurements: Goal: Diagnostic test results will improve Outcome: Progressing Goal: Signs and symptoms of infection will decrease Outcome: Progressing   Problem: Respiratory: Goal: Ability to maintain adequate ventilation will improve Outcome: Progressing

## 2021-03-11 NOTE — Progress Notes (Signed)
Patient alert to voice but lethargic.  Occasionally opens eyes when asked.  Opens mouth for oral hygiene.  Asks for liquids to drink, but unable to suck hard enough at straw to drink.  Attempt made to give PO fluids with cup, but patient immediately began choking.  MD aware, SLP consult placed.  Patient has had no PO intake for breakfast nor lunch.  Facial grimacing noticed.  Asked patient if she was in pain, and patient said, "No."  Patient repeatedly calls out for "Emily."  Angie Fava, RN

## 2021-03-11 NOTE — Assessment & Plan Note (Signed)
Meets criteria for BMI greater than 35+ comorbidities of heart failure and diabetes and hypertension

## 2021-03-11 NOTE — TOC Progression Note (Signed)
Transition of Care [facility]) - Progression Note    Patient Details  Name: [name] [name] MRN: [id number] Date of Birth: 17-Apr-1945  [facility] CM/SW Contact  Leeroy Cha, RN Phone Number: 03/11/2021, 7:53 AM  Clinical Narrative:    Positive bld cultures, iv ancef, wbc 22.4, following for toc needs   Expected Discharge Plan: Home/Self Care Barriers to Discharge: Continued Medical Work up  Expected Discharge Plan and Services Expected Discharge Plan: Home/Self Care   Discharge Planning Services: CM Consult   Living arrangements for the past 2 months: Single Family Home                                       Social Determinants of Health (SDOH) Interventions    Readmission Risk Interventions No flowsheet data found.

## 2021-03-11 NOTE — Progress Notes (Signed)
[facility] Progress Note  Patient: [name] [name]    KDT:[id number]  DOA: 03/08/2021    Date of Service: the patient was seen and examined on 03/11/2021  Brief hospital course: 76 year old female with past medical history of hypothyroidism, diastolic CHF, stage IIIb chronic kidney disease and mood disorder presented to the emergency room on 03/08/2021 after multiple falls at home.  Although poor historian, patient is noted to have slow decline.  In the emergency room, found to be hypoxic and septic from pneumonia.  Following admission, blood cultures positive for MSSA.   Assessment and Plan: * Sepsis ([place])- (present on admission) - Febrile, tachycardia, tachypnea, leukocytosis; presumed lung source given opacities on CT chest with possible aspiration however now having worsening left shoulder pain (s/p L deltoid kenalog injection on 03/06/21) and ongoing low back pain - Given vancomycin, cefepime, Flagyl in the ER - No significant risk factors for MDRO. She was then changed to CTX/azithro however after blood cultures grew MSSA, will change her to Unasyn for now and monitor further clinically - see bacteremia as well  Bacteremia- (present on admission) - 4/4 bottles positive on admission (1/22) with MSSA. Differential for etiology at this time includes recent outpt steroid injection on 1/20 to Left deltoid vs vertebral infection (worsening lower back pain) vs spontaneous bacteremia vs translocation from pulmonary source - abx changed to Unasyn which should cover presumed sources for now - obtain MRI left shoulder. ID has ordered MRI T/L spine.  Unfortunately, patient not somnolent to stay still - TTE on 1/23: some poor images but no obvious vegetations (EF 60-65%, mild LVH). Blood cultures from 1/24 1/25 with no growth to date  Acute respiratory failure with hypoxia ([place])- (present on admission) - Presumed due to underlying pneumonia/aspiration -Continue oxygen and wean as able - See  sepsis and bacteremia work-up otherwise  Acute renal failure superimposed on stage 3b chronic kidney disease ([place])- (present on admission) - patient has history of CKD3b. Baseline creat ~ 1.3, eGFR 38 -Creatinine on admission at 2.47 and with fluids has been slowly improving.  Down to 1.47 today.   Acute metabolic encephalopathy Patient more somnolent now.  Unclear etiology.  Will review sedating medications.  Check ammonia level.  Awake enough to move around so that unable to get MRI.  We will also check ABG.  Chronic diastolic heart failure ([place])- (present on admission) - No S/S exacerbation on exam.  Patient is volume depleted clinically - Continue fluids -Last echo reviewed from 11/06/2020: Moderate MR, impaired relaxation, EF 65% - TTE repeated on 1/23, EF 60-65%, mild LVH; trivial MR on read; normal diastology   Hypothyroidism- (present on admission) - TSH, 0.429 - continue Synthroid  DMII (diabetes mellitus, type 2) (HCC) - History of prediabetes, no A1c on file - A1c on admission 6.6% - Treat with sliding scale for now and diet control   Elevated LFTs Suspect secondary to shock liver from sepsis.  Transaminases slowly improving  Hyponatremia-resolved as of 03/09/2021, (present on admission) - presumed hypovolemic hyponatremia.  Resolved with IV fluids  Morbid obesity ([place])- (present on admission) Meets criteria for BMI greater than 35+ comorbidities of heart failure and diabetes and hypertension  Fall at home, initial encounter - Patient found on floor by EMS on their arrival.  Patient states floor is carpeted although she cannot provide any further collateral information regarding her fall or length of time on the floor - Given renal failure on admission which is still likely prerenal, check CK - Multiple imaging  studies on admission including right wrist x-ray, CT head, CT cervical spine, and CT chest: All with no acute abnormalities but does have 4 mm anterolisthesis C3  on C4 - continue pain control - will consult PT once more stable   Normocytic anemia - No complaints or signs of bleeding.  No recent lab work other than July 2021 (12.3 g/dL at that time).  Hemoglobin stable since admission.    Body mass index is 36.81 kg/m.        Consultants: Infectious disease  Procedures: Echocardiogram  Antimicrobials: Continue IV Ancef  Code Status: Full code   Subjective: Left message for son  Objective: Noted increased tachypnea, elevations in blood pressure Vitals:   03/11/21 1237 03/11/21 1629  BP: (!) 152/68 (!) 161/76  Pulse: 97 99  Resp: (!) 32 (!) 33  Temp: 98.7 F (37.1 C) 98.4 F (36.9 C)  SpO2: 95% 100%    Intake/Output Summary (Last 24 hours) at 03/11/2021 1749 Last data filed at 03/11/2021 1252 Gross per 24 hour  Intake 1223 ml  Output 2100 ml  Net -877 ml   Filed Weights   03/08/21 2041  Weight: 85.5 kg   Body mass index is 36.81 kg/m.  Exam:  General: Somnolent, lethargic HEENT: Normocephalic and atraumatic, mucous membranes slightly dry Cardiovascular: Regular rate and rhythm, S1-S2 Respiratory: Poor inspiratory effort, decreased breath sounds bibasilar Abdomen: Soft,?  Nontender, nondistended, hypoactive bowel sounds Musculoskeletal: No clubbing or cyanosis or edema Skin: No skin breaks, tears or lesions Psychiatry: [facility]: No focal deficits  Data Reviewed: Improving creatinine, down to 1.41 with continued poor albumin.  Transaminases slightly increased from previous day although improving overall.  White blood cell count increased to 22.4  Disposition:  Status is: Inpatient  Remains inpatient appropriate because: Continued work-up for infection    Family Communication: Left message for son DVT Prophylaxis: heparin injection 5,000 Units Start: 03/08/21 2200    Author: Annita Brod ,MD 03/11/2021 5:49 PM  To reach On-call, see care teams to locate the attending and reach out  via [email or web address]. Between 7PM-7AM, please contact night-coverage If you still have difficulty reaching the attending provider, please page the [facility] (Director on Call) for [facility] on amion for assistance.

## 2021-03-11 NOTE — Evaluation (Addendum)
Physical Therapy Evaluation Patient Details Name: [name] [name] MRN: [id number] DOB: 1945/05/09 Today's Date: 03/11/2021  History of Present Illness  76 yo female presents to ED on 1/22 with multiple falls, min confusion, hypoxia; workup for sepsis, ARF. CT chest showed bilateral pleural effusions, bilateral lower lobe bronchial wall thickening with consolidations concerning for possible pneumonia, possible 7 mm left apical lung nodule.  recent outpatient Kenalog injection in the left deltoid on 03/06/2021. PMH anxiety, dCHF, HLD, hypothyroidism, IBS, mood disorder, prediabetes, CKD 3b.  Clinical Impression   Pt presents with generalized weakness, impaired mentation with limited command following, poor sitting balance with recent history of multiple falls, impaired activity tolerance vs baseline. Pt to benefit from acute PT to address deficits. Pt requiring max +2 to move to/from EOB, pt vocalizing in pain especially when moving supine>sit but pt unable to localize when asked repeatedly. Pt wincing and flinching whenever PT or RN touched her R arm, back also appears to be sore based on when pt vocalizing during pivot to EOB. At baseline, pt is ambulatory and cares for her husband with dementia, recommending ST-SNF to return to PLOF. Recommending SLP consult given aspirating drink during session and recent history of aspiration PNA. PT to progress mobility as tolerated, and will continue to follow acutely.         Recommendations for follow up therapy are one component of a multi-disciplinary discharge planning process, led by the attending physician.  Recommendations may be updated based on patient status, additional functional criteria and insurance authorization.  Follow Up Recommendations Skilled nursing-short term rehab (<3 hours/day)    Assistance Recommended at Discharge Frequent or constant Supervision/Assistance  Patient can return home with the following  Two people to help with walking  and/or transfers;Two people to help with bathing/dressing/bathroom;Assistance with feeding;Assistance with cooking/housework;Assist for transportation;Help with stairs or ramp for entrance    Equipment Recommendations Other (comment) (tbd)  Recommendations for Other Services       Functional Status Assessment Patient has had a recent decline in their functional status and demonstrates the ability to make significant improvements in function in a reasonable and predictable amount of time.     Precautions / Restrictions Precautions Precautions: Fall Restrictions Weight Bearing Restrictions: No      Mobility  Bed Mobility Overal bed mobility: Needs Assistance Bed Mobility: Supine to Sit, Sit to Supine     Supine to sit: Max assist, +2 for physical assistance Sit to supine: Max assist, +2 for physical assistance   General bed mobility comments: max +2 for trunk and LE management, boost up in bed upon return to supine. Step-by-step cues, pt calling out in pain especially when transitioning to EOB but unable to state where pain is when asked.    Transfers                   General transfer comment: NT - waxing and waning mentation, unable to tolerate sitting >2 mins    Ambulation/Gait               General Gait Details: nt  Financial trader Rankin (Stroke Patients Only)       Balance Overall balance assessment: Needs assistance Sitting-balance support: Bilateral upper extremity supported, Feet supported Sitting balance-Leahy Scale: Poor Sitting balance - Comments: requires mod posterior assist to maintain upright sitting Postural control: Posterior lean  Pertinent Vitals/Pain Pain Assessment Pain Assessment: Faces Faces Pain Scale: Hurts even more Pain Descriptors / Indicators: Moaning, Grimacing, Guarding Pain Intervention(s): Limited activity within patient's  tolerance, Monitored during session, Repositioned    Home Living Family/patient expects to be discharged to:: Private residence Living Arrangements: Spouse/significant other;Children;Other (Comment) Available Help at Discharge: Family Type of Home: House         Home Layout: One level        Prior Function Prior Level of Function : Patient poor historian/Family not available             Mobility Comments: unsure of pt baseline mobility, per chart review pt with increased falls and weakness over the course of 4 days prior to arrival to ED ADLs Comments: Per RN Yong Channel, pt assists her husband with dementia     Hand Dominance   Dominant Hand: Right    Extremity/Trunk Assessment   Upper Extremity Assessment Upper Extremity Assessment: Defer to OT evaluation    Lower Extremity Assessment Lower Extremity Assessment: Generalized weakness;Difficult to assess due to impaired cognition    Cervical / Trunk Assessment Cervical / Trunk Assessment: Kyphotic  Communication   Communication: Expressive difficulties  Cognition Arousal/Alertness: Lethargic Behavior During Therapy: Restless Overall Cognitive Status: No family/caregiver present to determine baseline cognitive functioning Area of Impairment: Attention, Memory, Following commands, Safety/judgement, Awareness, Problem solving                   Current Attention Level: Focused Memory: Decreased short-term memory Following Commands: Follows one step commands inconsistently Safety/Judgement: Decreased awareness of deficits, Decreased awareness of safety Awareness: Intellectual Problem Solving: Slow processing, Requires verbal cues, Requires tactile cues, Difficulty sequencing General Comments: pt has difficulty answering subjective questions, inconsistently follows commands given waxing and waning mentation        General Comments General comments (skin integrity, edema, etc.): + aspiration event with small  sip of juice, RN present and PT recommending SLP consult especially given aspiration pna    Exercises     Assessment/Plan    PT Assessment Patient needs continued PT services  PT Problem List Decreased strength;Decreased mobility;Decreased activity tolerance;Decreased balance;Decreased knowledge of use of DME;Pain;Decreased cognition;Cardiopulmonary status limiting activity;Obesity;Decreased safety awareness       PT Treatment Interventions DME instruction;Therapeutic activities;Gait training;Therapeutic exercise;Patient/family education;Balance training;Functional mobility training;Neuromuscular re-education    PT Goals (Current goals can be found in the Care Plan section)  Acute Rehab PT Goals Patient Stated Goal: none stated PT Goal Formulation: Patient unable to participate in goal setting Time For Goal Achievement: 03/25/21 Potential to Achieve Goals: Fair    Frequency Min 2X/week     Co-evaluation               AM-PAC PT "6 Clicks" Mobility  Outcome Measure Help needed turning from your back to your side while in a flat bed without using bedrails?: A Lot Help needed moving from lying on your back to sitting on the side of a flat bed without using bedrails?: A Lot Help needed moving to and from a bed to a chair (including a wheelchair)?: Total Help needed standing up from a chair using your arms (e.g., wheelchair or bedside chair)?: Total Help needed to walk in hospital room?: Total Help needed climbing 3-5 steps with a railing? : Total 6 Click Score: 8    End of Session Equipment Utilized During Treatment: Oxygen Activity Tolerance: Patient limited by fatigue;Patient limited by lethargy Patient left: in bed;with call bell/phone within reach;with  bed alarm set;with nursing/sitter in room Nurse Communication: Mobility status PT Visit Diagnosis: Other abnormalities of gait and mobility (R26.89);Muscle weakness (generalized) (M62.81)    Time: 8483-5075 PT Time  Calculation (min) (ACUTE ONLY): 20 min   Charges:   PT Evaluation $PT Eval Low Complexity: 1 Low         Caley Ciaramitaro S, PT DPT Acute Rehabilitation Services Pager [phone]  Office [phone]   [place] E Ruffin Pyo 03/11/2021, 1:04 PM

## 2021-03-11 NOTE — Evaluation (Signed)
Clinical/Bedside Swallow Evaluation Patient Details  Name: [name] [name] MRN: [id number] Date of Birth: [birth year]  Today's Date: 03/11/2021 Time: SLP Start Time (ACUTE ONLY): 4259 SLP Stop Time (ACUTE ONLY): 1525 SLP Time Calculation (min) (ACUTE ONLY): 20 min  Past Medical History:  Past Medical History:  Diagnosis Date   Anxiety    being weaned off lithium will be off on 06/23/11   Bipolar disorder ([place])    Cancer ([place]) 07/2019   right breast IDC with DCIS   Diastolic heart failure ([place])    Diverticulosis 09/01/2011   mild, left sided   Family history of breast cancer    Family history of colon cancer    Hyperlipidemia    Hypothyroidism    IBS (irritable bowel syndrome)    Leg cramps    Mood disorder ([place])    Morbidly obese (HCC)    Numbness and tingling in left arm    Osteopenia    Prediabetes    Stage 3 chronic kidney disease ([place])    CKD stg 3   Vitamin D deficiency    Past Surgical History:  Past Surgical History:  Procedure Laterality Date   back biopsy     for skin   BREAST LUMPECTOMY WITH RADIOACTIVE SEED AND SENTINEL LYMPH NODE BIOPSY Right 08/29/2019   Procedure: RIGHT BREAST LUMPECTOMY WITH RADIOACTIVE SEED AND SENTINEL LYMPH NODE BIOPSY;  Surgeon: Erroll Luna, MD;  Location: [place];  Service: General;  Laterality: Right;   COLONOSCOPY  06/09/2006   Dr.Orr   COLONOSCOPY  09/01/2011   DILATATION & CURETTAGE/HYSTEROSCOPY WITH TRUECLEAR N/A 11/12/2013   Procedure: RESECTOSCOPIC POLYPECTOMY Irish Lack, D&C;  Surgeon: Terrance Mass, MD;  Location: [facility];  Service: Gynecology;  Laterality: N/A;   ENDOMETRIAL BIOPSY  09/14/2013   Dr. Uvaldo Rising   HPI:  Pt is a 76 yo female adm 3 days ago with weakness after being found down by her son.  Pt with PMH + for anxiety, mood disorder - weaned from litihium in 2013, morbidly obese, colon cancer, recurrent falls, recent back pain, decreased po intake, breast cancer.  CT chest showed partial  consolidations in the lower lobes could reflect atelectasis or mild pneumonia.  Per PT, pt was given water during her therapy session resulting in pt overtly coughing with intake.  Swallow evaluation was ordered.  She is currently on 3 liters oxygen via nasal cannula.  Swallow eval ordered.    Assessment / Plan / Recommendation  Clinical Impression  Pt currently lethargic but did awaken enough to answer basic yes/no questions and accept oral care.  After oral care, SLP provided her withpo trials including single ice chips and tsps nectar liquids.  Pt with delayed swallow across all consistencies followed by consistent throat clearing and nonproductive cough x1.  She is severely dysarthric with unintelligible speech.   Pt desired po intake- repeatedly opening her mouth.  No family present to establish baseline but a friend arrived and stated pt is much different than baseline.  Throat clearing noted before, during and after minimal po intake - without adequate clearance.  Pt is a high aspiration risk at this time - hopefully mentation is largest barrier- ? some baseline dysphagia given pt's decreased po intake and pna. Recommend NPO x single ice chips - no po medications and SLP will follow up next date am.  Of note, pt's friend passed SLP in the hallway and reported she gave the pt ice chips - which SLP strongly advised against  due to needing staff to position pt and assure pt adequately alert and tolerating ice chips. Informed RN of recommendations and posted swallow precaution sign. SLP Visit Diagnosis: Dysphagia, oropharyngeal phase (R13.12)    Aspiration Risk  Severe aspiration risk;Risk for inadequate nutrition/hydration    Diet Recommendation NPO;Ice chips PRN after oral care   Medication Administration: Via alternative means    Other  Recommendations Oral Care Recommendations: Oral care QID    Recommendations for follow up therapy are one component of a multi-disciplinary discharge planning  process, led by the attending physician.  Recommendations may be updated based on patient status, additional functional criteria and insurance authorization.  Follow up Recommendations Skilled nursing-short term rehab (<3 hours/day)      Assistance Recommended at Discharge Frequent or constant Supervision/AssistanceFull assist  Functional Status Assessment Patient has had a recent decline in their functional status and demonstrates the ability to make significant improvements in function in a reasonable and predictable amount of time.  Frequency and Duration min 2x/week  2 weeks       Prognosis Prognosis for Safe Diet Advancement: Guarded      Swallow Study   General Date of Onset: 03/11/21 HPI: Pt is a 76 yo female adm 3 days ago with weakness after being found down by her son.  Pt with PMH + for anxiety, mood disorder - weaned from litihium in 2013, morbidly obese, colon cancer, recurrent falls, recent back pain, decreased po intake, breast cancer.  CT chest showed partial consolidations in the lower lobes could reflect atelectasis or mild pneumonia.  Per PT, pt was given water during her therapy session resulting in pt overtly coughing with intake.  Swallow evaluation was ordered.  She is currently on 3 liters oxygen via nasal cannula.  Swallow eval ordered. Type of Study: Bedside Swallow Evaluation Previous Swallow Assessment: none Diet Prior to this Study: Regular;Thin liquids Temperature Spikes Noted: No Respiratory Status: Room air History of Recent Intubation: No Behavior/Cognition: Lethargic/Drowsy Oral Cavity Assessment: Dry Oral Care Completed by SLP: Yes Oral Cavity - Dentition: Adequate natural dentition Vision: Impaired for self-feeding Self-Feeding Abilities: Total assist Patient Positioning: Upright in bed Baseline Vocal Quality: Low vocal intensity;Breathy Volitional Cough: Cognitively unable to elicit Volitional Swallow: Unable to elicit    Oral/Motor/Sensory  Function Overall Oral Motor/Sensory Function: Generalized oral weakness   Ice Chips Ice chips: Impaired Presentation: Spoon Oral Phase Impairments: Poor awareness of bolus Oral Phase Functional Implications: Prolonged oral transit Pharyngeal Phase Impairments: Suspected delayed Swallow;Multiple swallows;Throat Clearing - Immediate;Throat Clearing - Delayed   Thin Liquid Thin Liquid: Not tested    Nectar Thick Nectar Thick Liquid: Impaired Presentation: Spoon Oral Phase Impairments: Reduced lingual movement/coordination Oral phase functional implications: Prolonged oral transit Pharyngeal Phase Impairments: Suspected delayed Swallow;Throat Clearing - Delayed;Throat Clearing - Immediate   Honey Thick Honey Thick Liquid: Not tested   Puree Puree: Not tested   Solid     Solid: Not tested      Macario Golds 03/11/2021,4:01 PM   Kathleen Lime, MS [place] Office [phone] Cell [phone]

## 2021-03-11 NOTE — Progress Notes (Signed)
[facility]   Reason for visit: Follow up on bacteremia  Interval History: repeat blood culture sent yersterday and one set today and no growth to date.  WBC up to 22.4, fever curve though has trended down.   Day 4 total antibiotics  Physical Exam: Constitutional:  Vitals:   03/11/21 1048 03/11/21 1237  BP: (!) 158/85 (!) 152/68  Pulse: 97 97  Resp: (!) 34 (!) 32  Temp: 98.4 F (36.9 C) 98.7 F (37.1 C)  SpO2: 94% 95%   patient appears in NAD Respiratory: Normal respiratory effort; CTA B Cardiovascular: RRR Neuro: confused  Review of Systems: Unable to be assessed due to mental status  Lab Results  Component Value Date   WBC 22.4 (H) 03/11/2021   HGB 10.1 (L) 03/11/2021   HCT 30.8 (L) 03/11/2021   MCV 96.0 03/11/2021   PLT 204 03/11/2021    Lab Results  Component Value Date   CREATININE 1.41 (H) 03/11/2021   BUN 54 (H) 03/11/2021   NA 145 03/11/2021   K 3.6 03/11/2021   CL 112 (H) 03/11/2021   CO2 23 03/11/2021    Lab Results  Component Value Date   ALT 94 (H) 03/11/2021   AST 106 (H) 03/11/2021   ALKPHOS 77 03/11/2021     Microbiology: Recent Results (from the past 240 hour(s))  Blood Culture (routine x 2)     Status: Abnormal   Collection Time: 03/08/21  2:37 PM   Specimen: BLOOD  Result Value Ref Range Status   Specimen Description   Final    BLOOD LEFT ANTECUBITAL Performed at [facility], [place] [address], [place], [place] [address]    Special Requests   Final    BOTTLES DRAWN AEROBIC AND ANAEROBIC Blood Culture results may not be optimal due to an excessive volume of blood received in culture bottles Performed at [facility] [address] [place], [place] [address]    Culture  Setup Time   Final    GRAM POSITIVE COCCI IN BOTH AEROBIC AND ANAEROBIC BOTTLES CRITICAL RESULT CALLED TO, READ BACK BY AND VERIFIED WITH: M LILLISTON,PHARMD@[id number]  03/09/21 [place] Performed at [facility], [place] [address] [place], [place] [address]    Culture STAPHYLOCOCCUS AUREUS (A)  Final   Report Status 03/11/2021 FINAL  Final   Organism ID, Bacteria STAPHYLOCOCCUS AUREUS  Final      Susceptibility   Staphylococcus aureus - MIC*    CIPROFLOXACIN <=0.5 SENSITIVE Sensitive     ERYTHROMYCIN <=0.25 SENSITIVE Sensitive     GENTAMICIN <=0.5 SENSITIVE Sensitive     OXACILLIN 0.5 SENSITIVE Sensitive     TETRACYCLINE <=1 SENSITIVE Sensitive     VANCOMYCIN <=0.5 SENSITIVE Sensitive     TRIMETH/SULFA <=10 SENSITIVE Sensitive     CLINDAMYCIN <=0.25 SENSITIVE Sensitive     RIFAMPIN <=0.5 SENSITIVE Sensitive     Inducible Clindamycin NEGATIVE Sensitive     * STAPHYLOCOCCUS AUREUS  Blood Culture ID Panel (Reflexed)     Status: Abnormal   Collection Time: 03/08/21  2:37 PM  Result Value Ref Range Status   Enterococcus faecalis NOT DETECTED NOT DETECTED Final   Enterococcus Faecium NOT DETECTED NOT DETECTED Final   Listeria monocytogenes NOT DETECTED NOT DETECTED Final   Staphylococcus species DETECTED (A) NOT DETECTED Final    Comment: CRITICAL RESULT CALLED TO, READ BACK BY AND VERIFIED WITH: M LILLISTON,PHARMD@[id number]  03/09/21 [place]    Staphylococcus aureus (BCID) DETECTED (A)  NOT DETECTED Final    Comment: CRITICAL RESULT CALLED TO, READ BACK BY AND VERIFIED WITH: M LILLISTON,PHARMD@[id number]  03/09/21 [place]    Staphylococcus epidermidis NOT DETECTED NOT DETECTED Final   Staphylococcus lugdunensis NOT DETECTED NOT DETECTED Final   Streptococcus species NOT DETECTED NOT DETECTED Final   Streptococcus agalactiae NOT DETECTED NOT DETECTED Final   Streptococcus pneumoniae NOT DETECTED NOT DETECTED Final   Streptococcus pyogenes NOT DETECTED NOT DETECTED Final   A.calcoaceticus-baumannii NOT DETECTED NOT DETECTED Final   Bacteroides fragilis NOT DETECTED NOT DETECTED Final   Enterobacterales NOT DETECTED NOT DETECTED Final   Enterobacter cloacae complex NOT DETECTED NOT DETECTED Final    Escherichia coli NOT DETECTED NOT DETECTED Final   Klebsiella aerogenes NOT DETECTED NOT DETECTED Final   Klebsiella oxytoca NOT DETECTED NOT DETECTED Final   Klebsiella pneumoniae NOT DETECTED NOT DETECTED Final   Proteus species NOT DETECTED NOT DETECTED Final   Salmonella species NOT DETECTED NOT DETECTED Final   Serratia marcescens NOT DETECTED NOT DETECTED Final   Haemophilus influenzae NOT DETECTED NOT DETECTED Final   Neisseria meningitidis NOT DETECTED NOT DETECTED Final   Pseudomonas aeruginosa NOT DETECTED NOT DETECTED Final   Stenotrophomonas maltophilia NOT DETECTED NOT DETECTED Final   Candida albicans NOT DETECTED NOT DETECTED Final   Candida auris NOT DETECTED NOT DETECTED Final   Candida glabrata NOT DETECTED NOT DETECTED Final   Candida krusei NOT DETECTED NOT DETECTED Final   Candida parapsilosis NOT DETECTED NOT DETECTED Final   Candida tropicalis NOT DETECTED NOT DETECTED Final   Cryptococcus neoformans/gattii NOT DETECTED NOT DETECTED Final   Meth resistant mecA/C and MREJ NOT DETECTED NOT DETECTED Final    Comment: Performed at [facility], [address] [place], [place] [address]  Resp Panel by RT-PCR (Flu A&B, Covid) Nasopharyngeal Swab     Status: None   Collection Time: 03/08/21  3:00 PM   Specimen: Nasopharyngeal Swab; Nasopharyngeal(NP) swabs in vial transport medium  Result Value Ref Range Status   SARS Coronavirus 2 by RT PCR NEGATIVE NEGATIVE Final    Comment: (NOTE) SARS-CoV-2 target nucleic acids are NOT DETECTED.  The SARS-CoV-2 RNA is generally detectable in upper respiratory specimens during the acute phase of infection. The lowest concentration of SARS-CoV-2 viral copies this assay can detect is 138 copies/mL. A negative result does not preclude SARS-Cov-2 infection and should not be used as the sole basis for treatment or other patient management decisions. A negative result may occur with  improper specimen collection/handling,  submission of specimen other than nasopharyngeal swab, presence of viral mutation(s) within the areas targeted by this assay, and inadequate number of viral copies(<138 copies/mL). A negative result must be combined with clinical observations, patient history, and epidemiological information. The expected result is Negative.  Fact Sheet for Patients:  EntrepreneurPulse.com.au  Fact Sheet for Healthcare Providers:  IncredibleEmployment.be  This test is no t yet approved or cleared by the [place] FDA and  has been authorized for detection and/or diagnosis of SARS-CoV-2 by FDA under an Emergency Use Authorization (EUA). This EUA will remain  in effect (meaning this test can be used) for the duration of the COVID-19 declaration under Section 564(b)(1) of the Act, 21 U.S.C.section 360bbb-3(b)(1), unless the authorization is terminated  or revoked sooner.       Influenza A by PCR NEGATIVE NEGATIVE Final   Influenza B by PCR NEGATIVE NEGATIVE Final    Comment: (NOTE) The Xpert Xpress SARS-CoV-2/FLU/RSV plus assay is intended as  an aid in the diagnosis of influenza from Nasopharyngeal swab specimens and should not be used as a sole basis for treatment. Nasal washings and aspirates are unacceptable for Xpert Xpress SARS-CoV-2/FLU/RSV testing.  Fact Sheet for Patients: EntrepreneurPulse.com.au  Fact Sheet for Healthcare Providers: IncredibleEmployment.be  This test is not yet approved or cleared by the [place] FDA and has been authorized for detection and/or diagnosis of SARS-CoV-2 by FDA under an Emergency Use Authorization (EUA). This EUA will remain in effect (meaning this test can be used) for the duration of the COVID-19 declaration under Section 564(b)(1) of the Act, 21 U.S.C. section 360bbb-3(b)(1), unless the authorization is terminated or revoked.  Performed at [facility], [place] [address], [place], [place] [address]   Blood Culture (routine x 2)     Status: Abnormal   Collection Time: 03/08/21  3:00 PM   Specimen: BLOOD  Result Value Ref Range Status   Specimen Description   Final    BLOOD RIGHT ANTECUBITAL Performed at [facility] [address] [place], [place] [address]    Special Requests   Final    BOTTLES DRAWN AEROBIC AND ANAEROBIC Blood Culture adequate volume Performed at [facility] [address], [place], [place] [address]    Culture  Setup Time   Final    GRAM POSITIVE COCCI IN BOTH AEROBIC AND ANAEROBIC BOTTLES CRITICAL VALUE NOTED.  VALUE IS CONSISTENT WITH PREVIOUSLY REPORTED AND CALLED VALUE.    Culture (A)  Final    STAPHYLOCOCCUS AUREUS SUSCEPTIBILITIES PERFORMED ON PREVIOUS CULTURE WITHIN THE LAST 5 DAYS. Performed at [facility], [place] [address] [place], [place] [address]    Report Status 03/11/2021 FINAL  Final  Urine Culture     Status: None   Collection Time: 03/08/21  3:41 PM   Specimen: In/Out Cath Urine  Result Value Ref Range Status   Specimen Description   Final    IN/OUT CATH URINE Performed at [facility] [address] [place], [place] [address]    Special Requests   Final    NONE Performed at [facility], [place] [address] [place], [place] [address]    Culture   Final    NO GROWTH Performed at [facility], [place] [address] [place], [place] [address]    Report Status 03/09/2021 FINAL  Final  Culture, blood (routine x 2)     Status: None (Preliminary result)   Collection Time: 03/10/21  3:48 AM   Specimen: BLOOD LEFT HAND  Result Value Ref Range Status   Specimen Description   Final    BLOOD LEFT HAND Performed at [facility] [address], [place], [place] [address]    Special Requests   Final    BOTTLES DRAWN AEROBIC AND ANAEROBIC Blood Culture adequate volume Performed at  [facility] [address] [place], [place] [address]    Culture  Setup Time PENDING  Incomplete   Culture   Final    NO GROWTH 1 DAY Performed at [facility], [place] [address], [place], [place] [address]    Report Status PENDING  Incomplete  Culture, blood (routine x 2)     Status: None (Preliminary result)   Collection Time: 03/10/21  3:55 AM   Specimen: BLOOD LEFT FOREARM  Result Value Ref Range Status   Specimen Description   Final    BLOOD LEFT FOREARM Performed at [facility], [place] [address] [place], [place] [address]    Special Requests   Final    BOTTLES DRAWN AEROBIC ONLY Blood Culture results may not be optimal due to an inadequate volume of blood received in culture bottles Performed at [facility] [address], [place], [place] [address]    Culture   Final    NO GROWTH 1 DAY Performed at [facility], [place] [address] [place], [place] [address]    Report Status PENDING  Incomplete    Impression/Plan:  1. MSSA bacteremia - 4/4 blood cultures positive and on cefazolin.  Has had back pain, bilateral extremity weakness and I am concerned with epidural abscess but unable to get MRI to date. She did have a CT of the head and no obvious sign of CNS emboli.  Work up for her has been difficult with the confusion.  Will continue with cefazolin.  May consider CT scan of her lumbar and thoracic spine but with her underlying renal disease, would want to avoid contrast, so not as useful as an MRI.    2.  Confusion - unclear etiology and exacerbated with medications given to try to get the MRI.  There was no concern on CT head for emboli   3.  Renal insufficiency - looks to be at about her baseline with a creat today of 1.41.    4.  Transaminitis - present on admission and improved some.  Continue to monitor.

## 2021-03-11 NOTE — Progress Notes (Signed)
Pharmacy Antibiotic Note  [name] [name] is a 76 y.o. female admitted on 03/08/2021 with MSSA bacteremia.  Pharmacy has been consulted for Cefazolin dosing.  The patient's renal function has continued to improve with SCr down to 1.41 this AM and appears to be at the patient's known baseline of 1.3-1.5, will adjust Cefazolin dose accordingly.   Plan: - Adjust Cefazolin to 2g IV every 8 hours - Will continue to follow renal function, ID work-up, and LOT plans.  Weight: 85.5 kg (188 lb 7.9 oz)  Temp (24hrs), Avg:98.3 F (36.8 C), Min:97.5 F (36.4 C), Max:98.7 F (37.1 C)  Recent Labs  Lab 03/08/21 1436 03/09/21 0351 03/10/21 0355 03/11/21 0046  WBC 16.2* 18.4* 21.1* 22.4*  CREATININE 2.47* 1.78* 1.67* 1.41*  LATICACIDVEN 1.8  --   --   --      Estimated Creatinine Clearance: 33.5 mL/min (A) (by C-G formula based on SCr of 1.41 mg/dL (H)).    No Known Allergies  Antimicrobials this admission: Vancomycin 1/22 x 1 Metronidazole 1/22 >> 1/23 Cefepime 1/22 x 1 Ceftriaxone 1/22 x 1 Unasyn 1/23 >> 1/24 Cefazolin 1/24 >>  Dose adjustments this admission: N/a  Microbiology results: 1/22 COVID/flu >> neg 1/22 BCx >> Staph Aureus (BCID MSSA) 1/14 BCx >>  Thank you for allowing pharmacy to be a part of this patients care.  Alycia Rossetti, PharmD, BCPS Infectious Diseases Clinical Pharmacist 03/11/2021 8:03 AM   **Pharmacist phone directory can now be found on [email or web address] ([email or web address]).  Listed under [place].

## 2021-03-11 NOTE — Progress Notes (Signed)
Patient's BP 180/130. MD notified.

## 2021-03-11 NOTE — Assessment & Plan Note (Addendum)
-   awake but train of thought still intermittently abnormal

## 2021-03-12 DIAGNOSIS — N179 Acute kidney failure, unspecified: Secondary | ICD-10-CM | POA: Diagnosis not present

## 2021-03-12 DIAGNOSIS — G9341 Metabolic encephalopathy: Secondary | ICD-10-CM | POA: Diagnosis not present

## 2021-03-12 DIAGNOSIS — E870 Hyperosmolality and hypernatremia: Secondary | ICD-10-CM | POA: Diagnosis not present

## 2021-03-12 DIAGNOSIS — A4101 Sepsis due to Methicillin susceptible Staphylococcus aureus: Secondary | ICD-10-CM | POA: Diagnosis not present

## 2021-03-12 DIAGNOSIS — B370 Candidal stomatitis: Secondary | ICD-10-CM | POA: Clinically undetermined

## 2021-03-12 DIAGNOSIS — R7881 Bacteremia: Secondary | ICD-10-CM | POA: Diagnosis not present

## 2021-03-12 LAB — COMPREHENSIVE METABOLIC PANEL
ALT: 43 U/L (ref 0–44)
AST: 77 U/L — ABNORMAL HIGH (ref 15–41)
Albumin: 2 g/dL — ABNORMAL LOW (ref 3.5–5.0)
Alkaline Phosphatase: 77 U/L (ref 38–126)
Anion gap: 11 (ref 5–15)
BUN: 61 mg/dL — ABNORMAL HIGH (ref 8–23)
CO2: 21 mmol/L — ABNORMAL LOW (ref 22–32)
Calcium: 8.9 mg/dL (ref 8.9–10.3)
Chloride: 120 mmol/L — ABNORMAL HIGH (ref 98–111)
Creatinine, Ser: 1.43 mg/dL — ABNORMAL HIGH (ref 0.44–1.00)
GFR, Estimated: 38 mL/min — ABNORMAL LOW (ref >60)
Glucose, Bld: 190 mg/dL — ABNORMAL HIGH (ref 70–99)
Potassium: 3.7 mmol/L (ref 3.5–5.1)
Sodium: 152 mmol/L — ABNORMAL HIGH (ref 135–145)
Total Bilirubin: 1 mg/dL (ref 0.3–1.2)
Total Protein: 6.3 g/dL — ABNORMAL LOW (ref 6.5–8.1)

## 2021-03-12 LAB — CBC WITH DIFFERENTIAL/PLATELET
Abs Immature Granulocytes: 2.65 10*3/uL — ABNORMAL HIGH (ref 0.00–0.07)
Basophils Absolute: 0.2 10*3/uL — ABNORMAL HIGH (ref 0.0–0.1)
Basophils Relative: 1 %
Eosinophils Absolute: 0 10*3/uL (ref 0.0–0.5)
Eosinophils Relative: 0 %
HCT: 31.6 % — ABNORMAL LOW (ref 36.0–46.0)
Hemoglobin: 10.3 g/dL — ABNORMAL LOW (ref 12.0–15.0)
Immature Granulocytes: 12 %
Lymphocytes Relative: 5 %
Lymphs Abs: 1.2 10*3/uL (ref 0.7–4.0)
MCH: 31.9 pg (ref 26.0–34.0)
MCHC: 32.6 g/dL (ref 30.0–36.0)
MCV: 97.8 fL (ref 80.0–100.0)
Monocytes Absolute: 0.9 10*3/uL (ref 0.1–1.0)
Monocytes Relative: 4 %
Neutro Abs: 17.7 10*3/uL — ABNORMAL HIGH (ref 1.7–7.7)
Neutrophils Relative %: 78 %
Platelets: 255 10*3/uL (ref 150–400)
RBC: 3.23 MIL/uL — ABNORMAL LOW (ref 3.87–5.11)
RDW: 14.5 % (ref 11.5–15.5)
WBC: 22.6 10*3/uL — ABNORMAL HIGH (ref 4.0–10.5)
nRBC: 0 % (ref 0.0–0.2)

## 2021-03-12 LAB — BASIC METABOLIC PANEL
Anion gap: 9 (ref 5–15)
BUN: 65 mg/dL — ABNORMAL HIGH (ref 8–23)
CO2: 21 mmol/L — ABNORMAL LOW (ref 22–32)
Calcium: 8.8 mg/dL — ABNORMAL LOW (ref 8.9–10.3)
Chloride: 127 mmol/L — ABNORMAL HIGH (ref 98–111)
Creatinine, Ser: 1.52 mg/dL — ABNORMAL HIGH (ref 0.44–1.00)
GFR, Estimated: 36 mL/min — ABNORMAL LOW (ref >60)
Glucose, Bld: 175 mg/dL — ABNORMAL HIGH (ref 70–99)
Potassium: 5.1 mmol/L (ref 3.5–5.1)
Sodium: 157 mmol/L — ABNORMAL HIGH (ref 135–145)

## 2021-03-12 LAB — GLUCOSE, CAPILLARY
Glucose-Capillary: 158 mg/dL — ABNORMAL HIGH (ref 70–99)
Glucose-Capillary: 144 mg/dL — ABNORMAL HIGH (ref 70–99)
Glucose-Capillary: 158 mg/dL — ABNORMAL HIGH (ref 70–99)
Glucose-Capillary: 172 mg/dL — ABNORMAL HIGH (ref 70–99)
Glucose-Capillary: 165 mg/dL — ABNORMAL HIGH (ref 70–99)

## 2021-03-12 LAB — MAGNESIUM: Magnesium: 2.5 mg/dL — ABNORMAL HIGH (ref 1.7–2.4)

## 2021-03-12 LAB — CARBAMAZEPINE, FREE AND TOTAL
Carbamazepine, Free: 2.7 ug/mL (ref 0.6–4.2)
Carbamazepine, Total: 8.1 ug/mL (ref 4.0–12.0)

## 2021-03-12 MED ORDER — LACTULOSE 10 GM/15ML PO SOLN
20.0000 g | Freq: Once | ORAL | Status: AC
  Administered 2021-03-12: 20 g via ORAL
  Filled 2021-03-12: qty 30

## 2021-03-12 MED ORDER — BISACODYL 10 MG RE SUPP
10.0000 mg | Freq: Every day | RECTAL | Status: DC | PRN
  Administered 2021-03-12 – 2021-03-29 (×2): 10 mg via RECTAL
  Filled 2021-03-12 (×2): qty 1

## 2021-03-12 MED ORDER — FLEET ENEMA 7-19 GM/118ML RE ENEM
1.0000 | ENEMA | Freq: Once | RECTAL | Status: AC
  Administered 2021-03-12: 1 via RECTAL
  Filled 2021-03-12: qty 1

## 2021-03-12 MED ORDER — FLUCONAZOLE IN SODIUM CHLORIDE 200-0.9 MG/100ML-% IV SOLN
200.0000 mg | INTRAVENOUS | Status: DC
  Administered 2021-03-12 – 2021-03-17 (×6): 200 mg via INTRAVENOUS
  Filled 2021-03-12 (×7): qty 100

## 2021-03-12 MED ORDER — DEXTROSE 5 % IV SOLN: INTRAVENOUS | Status: DC

## 2021-03-12 MED ORDER — SODIUM CHLORIDE 0.45 % IV SOLN: INTRAVENOUS | Status: DC

## 2021-03-12 NOTE — Progress Notes (Signed)
Speech Language Pathology Treatment: Dysphagia  Patient Details Name: [name] [name] MRN: [id number] DOB: 1945-11-26 Today's Date: 03/12/2021 Time: 1275-1700 SLP Time Calculation (min) (ACUTE ONLY): 30 min  Assessment / Plan / Recommendation Clinical Impression  Second visit to determine readiness for po advancement after ID saw pt.  Pt very willing to consume po intake with SLP.  After extensive oral care, frequent chronic throat clearing noted with intake and with oral care.  Suspect this is mostly due to secretion/? candidiasis retention in pharynx.  Pt consumed thin water, nectar thick applejuice and applesauce with total assist - throat clearing noted with pt attempting to "Hock" and expectorate but not successful.   Despite throat clearing, pt did not demonstrate discomfort, increased WOB.  Pt requires total cues to swallow and attend to tasks but is demonstrating desire to eat. Pt able to seal lips on straw, cup and spoon.  Swallow appears timely overall despite mentation but throat clearing coontinues.  Cannot rule out component of aspiration, however given pt's severe constipation - would not advise MBS due to barium.  Recommend if MD approves, clear liquid diet with strict precautions - only being fed by staff and ceasing intake if pt coughing.  Pt has made significant progress in terms of mentation today - RN informed of recommendations.    HPI HPI: Pt is a 76 yo female adm 3 days ago with weakness after being found down by her son.  Pt with PMH + for anxiety, mood disorder - weaned from litihium in 2013, morbidly obese, colon cancer, recurrent falls, recent back pain, decreased po intake, breast cancer.  CT chest showed partial consolidations in the lower lobes could reflect atelectasis or mild pneumonia.  Per PT, pt was given water during her therapy session resulting in pt overtly coughing with intake.  Swallow evaluation was ordered.  She is currently on 3 liters oxygen via nasal cannula.   Swallow eval completed on 03/11/2021 and she was made NPO x ice chips.      SLP Plan  Continue with current plan of care      Recommendations for follow up therapy are one component of a multi-disciplinary discharge planning process, led by the attending physician.  Recommendations may be updated based on patient status, additional functional criteria and insurance authorization.    Recommendations  Diet recommendations: Thin liquid Liquids provided via: Cup Medication Administration: Crushed with puree Supervision: Full supervision/cueing for compensatory strategies Compensations: Other (Comment);Slow rate;Small sips/bites (oral suction after po if needed) Postural Changes and/or Swallow Maneuvers: Seated upright 90 degrees;Upright 30-60 min after meal                Oral Care Recommendations: Oral care QID Follow Up Recommendations: Skilled nursing-short term rehab (<3 hours/day) Assistance recommended at discharge: Frequent or constant Supervision/Assistance SLP Visit Diagnosis: Dysphagia, oropharyngeal phase (R13.12) Plan: Continue with current plan of care         Kathleen Lime, MS [place] Cell [phone]   Macario Golds  03/12/2021, 11:59 AM

## 2021-03-12 NOTE — Progress Notes (Signed)
Per patient's family, patient has not had BM in >5 days.  Patient's abdomen taut and distended.  MD notified, suppository ordered.  Prior to administration, a medium amount of brown Type 1 stool balls moved.  Suppository administered.  Pt tolerated well.  Angie Fava, RN

## 2021-03-12 NOTE — Progress Notes (Signed)
[facility] Progress Note  Patient: [name] [name]    TDH:[id number]  DOA: 03/08/2021    Date of Service: the patient was seen and examined on 03/12/2021  Brief hospital course: 76 year old female with past medical history of hypothyroidism, diastolic CHF, stage IIIb chronic kidney disease and mood disorder presented to the emergency room on 03/08/2021 after multiple falls at home.  Although poor historian, patient is noted to have slow decline.  In the emergency room, found to be hypoxic and septic from pneumonia.  Following admission, blood cultures positive for MSSA.  Work-up ongoing to ensure source.  ID plans for TEE next week.  Assessment and Plan: * Sepsis ([place])- (present on admission) - Febrile, tachycardia, tachypnea, leukocytosis; presumed lung source given opacities on CT chest with possible aspiration however now having worsening left shoulder pain (s/p L deltoid kenalog injection on 03/06/21) and ongoing low back pain - Given vancomycin, cefepime, Flagyl in the ER - No significant risk factors for MDRO, then changed to CTX/azithro after blood cultures grew MSSA, will change her to Unasyn.  No further fevers times greater than 24 hours.  White blood cell count still staying elevated.  ID following.  Plan for TEE next week.  We will attempt to get MRI under general sedation  Bacteremia- (present on admission) - 4/4 bottles positive on admission (1/22) with MSSA. Differential for etiology at this time includes recent outpt steroid injection on 1/20 to Left deltoid vs vertebral infection (worsening lower back pain) vs spontaneous bacteremia vs translocation from pulmonary source - abx changed to Unasyn which should cover presumed sources for now - obtain MRI left shoulder. ID has ordered MRI T/L spine.  Unfortunately, patient too agitated to stay still.  Will likely need to get MRI with general anesthesia. - TTE on 1/23: some poor images but no obvious vegetations (EF 60-65%, mild LVH).   TEE next week Blood cultures from 1/24 1/25 with no growth to date  Acute respiratory failure with hypoxia ([place])- (present on admission) - Presumed due to underlying pneumonia/aspiration -Continue oxygen and wean as able - See sepsis and bacteremia work-up otherwise  Hypernatremia Due to poor p.o. intake from altered mentation.  Have changed IV fluids to half-normal saline.  Recheck basic metabolic panel this afternoon and if it is still elevated, changed to D5W.  Acute renal failure superimposed on stage 3b chronic kidney disease ([place])- (present on admission) - patient has history of CKD3b. Baseline creat ~ 1.3, eGFR 38 -Creatinine on admission at 2.47 and with fluids has been slowly improving.  Staying around 1.4, but with elevated BUN, have increased IV fluids.  Acute metabolic encephalopathy Patient more somnolent now.  Unclear etiology.  Would expect uremia to improve as her renal function does.  No sedating medications.  ABG notes no hypercarbia.  Ammonia level only at 36.  Noted increase in sodium to 152.  This may be contributing factor.  She is more awake today although still confused.  Chronic diastolic heart failure ([place])- (present on admission) - No S/S exacerbation on exam.  Patient is volume depleted clinically - Continue fluids.  Noted mildly elevated BNP.  Continue fluids and will recheck BMP in the morning. -Last echo reviewed from 11/06/2020: Moderate MR, impaired relaxation, EF 65% - TTE repeated on 1/23, EF 60-65%, mild LVH; trivial MR on read; normal diastology   Hypothyroidism- (present on admission) - TSH, 0.429 - continue Synthroid  DMII (diabetes mellitus, type 2) (HCC) - History of prediabetes, no A1c on file -  A1c on admission 6.6% - Treat with sliding scale for now and diet control   Elevated LFTs- (present on admission) Suspect secondary to shock liver from sepsis.  Transaminases slowly improving  Hyponatremia-resolved as of 03/09/2021, (present on  admission) - presumed hypovolemic hyponatremia.  Resolved with IV fluids  Morbid obesity ([place])- (present on admission) Meets criteria for BMI greater than 35+ comorbidities of heart failure and diabetes and hypertension  Thrush, oral Started on fluconazole.  Fall at home, initial encounter - Patient found on floor by EMS on their arrival.  Patient states floor is carpeted although she cannot provide any further collateral information regarding her fall or length of time on the floor - Given renal failure on admission which is still likely prerenal, check CK - Multiple imaging studies on admission including right wrist x-ray, CT head, CT cervical spine, and CT chest: All with no acute abnormalities but does have 4 mm anterolisthesis C3 on C4 - continue pain control - will consult PT once more stable   Normocytic anemia - No complaints or signs of bleeding.  No recent lab work other than July 2021 (12.3 g/dL at that time).  Hemoglobin stable since admission.    Body mass index is 36.81 kg/m.        Consultants: Infectious disease  Procedures: Echocardiogram-no vegetations noted Plan for TEE  Antimicrobials: Continue IV Ancef  Code Status: Full code   Subjective: Patient more awake and interactive.  Still somewhat confused and anxious.  Denies any pain other than in her right wrist  Objective: Noted increased tachypnea, elevations in blood pressure Vitals:   03/12/21 1308 03/12/21 1526  BP:  (!) 158/83  Pulse: 98 96  Resp: 20 (!) 40  Temp: 99.1 F (37.3 C) 98.8 F (37.1 C)  SpO2: 95% 95%    Intake/Output Summary (Last 24 hours) at 03/12/2021 1638 Last data filed at 03/12/2021 1420 Gross per 24 hour  Intake 1530.15 ml  Output 3250 ml  Net -1719.85 ml    Filed Weights   03/08/21 2041  Weight: 85.5 kg   Body mass index is 36.81 kg/m.  Exam:  General: Awake, anxious HEENT: Normocephalic and atraumatic, mucous membranes slightly dry, noted oral thrush on  her tongue Cardiovascular: Regular rate and rhythm, S1-S2 Respiratory: Poor inspiratory effort, decreased breath sounds bibasilar Abdomen: Soft,?  Nontender, nondistended, hypoactive bowel sounds Musculoskeletal: No clubbing or cyanosis or edema.  Right wrist tender to flexion Skin: No skin breaks, tears or lesions Psychiatry: [place] Neurology: No focal deficits  Data Reviewed: Creatinine unchanged from previous day.  Sodium up to 152.  White blood cell count also unchanged staying around 22.  Disposition:  Status is: Inpatient  Remains inpatient appropriate because: Continued work-up for infection    Family Communication: Left message for daughter DVT Prophylaxis: heparin injection 5,000 Units Start: 03/08/21 2200    Author: Annita Brod ,MD 03/12/2021 4:38 PM  To reach On-call, see care teams to locate the attending and reach out via [email or web address]. Between 7PM-7AM, please contact night-coverage If you still have difficulty reaching the attending provider, please page the [facility] (Director on Call) for [facility] on amion for assistance.

## 2021-03-12 NOTE — Progress Notes (Signed)
[place] for Infectious Disease   Reason for visit: Follow up on bacteremia   Interval History: some improvement in her confusion today; WBC stable at 22.6; some dysphagia noted  Day 5 total antibiotics  Physical Exam: Constitutional:  Vitals:   03/12/21 0420 03/12/21 1308  BP: (!) 163/89   Pulse: (!) 101 98  Resp: 19 20  Temp: 98.4 F (36.9 C) 99.1 F (37.3 C)  SpO2: 95% 95%   patient appears in NAD Eyes: anicteric HENT: + thrush on tongue and pharynx Respiratory: Normal respiratory effort Neuro: moving all 4 extemities; some pain with palpation of her right wrist   Review of Systems: Constitutional: negative for fevers and chills Gastrointestinal: negative for nausea and diarrhea  Lab Results  Component Value Date   WBC 22.6 (H) 03/12/2021   HGB 10.3 (L) 03/12/2021   HCT 31.6 (L) 03/12/2021   MCV 97.8 03/12/2021   PLT 255 03/12/2021    Lab Results  Component Value Date   CREATININE 1.43 (H) 03/12/2021   BUN 61 (H) 03/12/2021   NA 152 (H) 03/12/2021   K 3.7 03/12/2021   CL 120 (H) 03/12/2021   CO2 21 (L) 03/12/2021    Lab Results  Component Value Date   ALT 43 03/12/2021   AST 77 (H) 03/12/2021   ALKPHOS 77 03/12/2021     Microbiology: Recent Results (from the past 240 hour(s))  Blood Culture (routine x 2)     Status: Abnormal   Collection Time: 03/08/21  2:37 PM   Specimen: BLOOD  Result Value Ref Range Status   Specimen Description   Final    BLOOD LEFT ANTECUBITAL Performed at [facility], [place] [address], [place], [place] [address]    Special Requests   Final    BOTTLES DRAWN AEROBIC AND ANAEROBIC Blood Culture results may not be optimal due to an excessive volume of blood received in culture bottles Performed at [place] [address] [place], [place] [address]    Culture  Setup Time   Final    GRAM POSITIVE COCCI IN BOTH AEROBIC AND ANAEROBIC BOTTLES CRITICAL RESULT CALLED TO, READ  BACK BY AND VERIFIED WITH: M LILLISTON,PHARMD@[id number]  03/09/21 [place] Performed at [facility], [place] [address], [place], [place] [address]    Culture STAPHYLOCOCCUS AUREUS (A)  Final   Report Status 03/11/2021 FINAL  Final   Organism ID, Bacteria STAPHYLOCOCCUS AUREUS  Final      Susceptibility   Staphylococcus aureus - MIC*    CIPROFLOXACIN <=0.5 SENSITIVE Sensitive     ERYTHROMYCIN <=0.25 SENSITIVE Sensitive     GENTAMICIN <=0.5 SENSITIVE Sensitive     OXACILLIN 0.5 SENSITIVE Sensitive     TETRACYCLINE <=1 SENSITIVE Sensitive     VANCOMYCIN <=0.5 SENSITIVE Sensitive     TRIMETH/SULFA <=10 SENSITIVE Sensitive     CLINDAMYCIN <=0.25 SENSITIVE Sensitive     RIFAMPIN <=0.5 SENSITIVE Sensitive     Inducible Clindamycin NEGATIVE Sensitive     * STAPHYLOCOCCUS AUREUS  Blood Culture ID Panel (Reflexed)     Status: Abnormal   Collection Time: 03/08/21  2:37 PM  Result Value Ref Range Status   Enterococcus faecalis NOT DETECTED NOT DETECTED Final   Enterococcus Faecium NOT DETECTED NOT DETECTED Final   Listeria monocytogenes NOT DETECTED NOT DETECTED Final   Staphylococcus species DETECTED (A) NOT DETECTED Final    Comment: CRITICAL RESULT CALLED TO, READ BACK BY AND VERIFIED WITH: M LILLISTON,PHARMD@[id number]  03/09/21 [place]  Staphylococcus aureus (BCID) DETECTED (A) NOT DETECTED Final    Comment: CRITICAL RESULT CALLED TO, READ BACK BY AND VERIFIED WITH: M LILLISTON,PHARMD@[id number]  03/09/21 [place]    Staphylococcus epidermidis NOT DETECTED NOT DETECTED Final   Staphylococcus lugdunensis NOT DETECTED NOT DETECTED Final   Streptococcus species NOT DETECTED NOT DETECTED Final   Streptococcus agalactiae NOT DETECTED NOT DETECTED Final   Streptococcus pneumoniae NOT DETECTED NOT DETECTED Final   Streptococcus pyogenes NOT DETECTED NOT DETECTED Final   A.calcoaceticus-baumannii NOT DETECTED NOT DETECTED Final   Bacteroides fragilis NOT DETECTED NOT DETECTED Final   Enterobacterales NOT DETECTED NOT  DETECTED Final   Enterobacter cloacae complex NOT DETECTED NOT DETECTED Final   Escherichia coli NOT DETECTED NOT DETECTED Final   Klebsiella aerogenes NOT DETECTED NOT DETECTED Final   Klebsiella oxytoca NOT DETECTED NOT DETECTED Final   Klebsiella pneumoniae NOT DETECTED NOT DETECTED Final   Proteus species NOT DETECTED NOT DETECTED Final   Salmonella species NOT DETECTED NOT DETECTED Final   Serratia marcescens NOT DETECTED NOT DETECTED Final   Haemophilus influenzae NOT DETECTED NOT DETECTED Final   Neisseria meningitidis NOT DETECTED NOT DETECTED Final   Pseudomonas aeruginosa NOT DETECTED NOT DETECTED Final   Stenotrophomonas maltophilia NOT DETECTED NOT DETECTED Final   Candida albicans NOT DETECTED NOT DETECTED Final   Candida auris NOT DETECTED NOT DETECTED Final   Candida glabrata NOT DETECTED NOT DETECTED Final   Candida krusei NOT DETECTED NOT DETECTED Final   Candida parapsilosis NOT DETECTED NOT DETECTED Final   Candida tropicalis NOT DETECTED NOT DETECTED Final   Cryptococcus neoformans/gattii NOT DETECTED NOT DETECTED Final   Meth resistant mecA/C and MREJ NOT DETECTED NOT DETECTED Final    Comment: Performed at [facility], [address], [place], [place] [address]  Resp Panel by RT-PCR (Flu A&B, Covid) Nasopharyngeal Swab     Status: None   Collection Time: 03/08/21  3:00 PM   Specimen: Nasopharyngeal Swab; Nasopharyngeal(NP) swabs in vial transport medium  Result Value Ref Range Status   SARS Coronavirus 2 by RT PCR NEGATIVE NEGATIVE Final    Comment: (NOTE) SARS-CoV-2 target nucleic acids are NOT DETECTED.  The SARS-CoV-2 RNA is generally detectable in upper respiratory specimens during the acute phase of infection. The lowest concentration of SARS-CoV-2 viral copies this assay can detect is 138 copies/mL. A negative result does not preclude SARS-Cov-2 infection and should not be used as the sole basis for treatment or other patient management decisions.  A negative result may occur with  improper specimen collection/handling, submission of specimen other than nasopharyngeal swab, presence of viral mutation(s) within the areas targeted by this assay, and inadequate number of viral copies(<138 copies/mL). A negative result must be combined with clinical observations, patient history, and epidemiological information. The expected result is Negative.  Fact Sheet for Patients:  EntrepreneurPulse.com.au  Fact Sheet for Healthcare Providers:  IncredibleEmployment.be  This test is no t yet approved or cleared by the [place] FDA and  has been authorized for detection and/or diagnosis of SARS-CoV-2 by FDA under an Emergency Use Authorization (EUA). This EUA will remain  in effect (meaning this test can be used) for the duration of the COVID-19 declaration under Section 564(b)(1) of the Act, 21 U.S.C.section 360bbb-3(b)(1), unless the authorization is terminated  or revoked sooner.       Influenza A by PCR NEGATIVE NEGATIVE Final   Influenza B by PCR NEGATIVE NEGATIVE Final    Comment: (NOTE) The Xpert Xpress SARS-CoV-2/FLU/RSV  plus assay is intended as an aid in the diagnosis of influenza from Nasopharyngeal swab specimens and should not be used as a sole basis for treatment. Nasal washings and aspirates are unacceptable for Xpert Xpress SARS-CoV-2/FLU/RSV testing.  Fact Sheet for Patients: EntrepreneurPulse.com.au  Fact Sheet for Healthcare Providers: IncredibleEmployment.be  This test is not yet approved or cleared by the [place] FDA and has been authorized for detection and/or diagnosis of SARS-CoV-2 by FDA under an Emergency Use Authorization (EUA). This EUA will remain in effect (meaning this test can be used) for the duration of the COVID-19 declaration under Section 564(b)(1) of the Act, 21 U.S.C. section 360bbb-3(b)(1), unless the authorization  is terminated or revoked.  Performed at [facility], [place] [address] [place], [place] [address]   Blood Culture (routine x 2)     Status: Abnormal   Collection Time: 03/08/21  3:00 PM   Specimen: BLOOD  Result Value Ref Range Status   Specimen Description   Final    BLOOD RIGHT ANTECUBITAL Performed at [place] [address] [place], [place] [address]    Special Requests   Final    BOTTLES DRAWN AEROBIC AND ANAEROBIC Blood Culture adequate volume Performed at [place] [address], [place], [place] [address]    Culture  Setup Time   Final    GRAM POSITIVE COCCI IN BOTH AEROBIC AND ANAEROBIC BOTTLES CRITICAL VALUE NOTED.  VALUE IS CONSISTENT WITH PREVIOUSLY REPORTED AND CALLED VALUE.    Culture (A)  Final    STAPHYLOCOCCUS AUREUS SUSCEPTIBILITIES PERFORMED ON PREVIOUS CULTURE WITHIN THE LAST 5 DAYS. Performed at [facility], [place] [address] [place], [place] [address]    Report Status 03/11/2021 FINAL  Final  Urine Culture     Status: None   Collection Time: 03/08/21  3:41 PM   Specimen: In/Out Cath Urine  Result Value Ref Range Status   Specimen Description   Final    IN/OUT CATH URINE Performed at [place] [address], [place], [place] [address]    Special Requests   Final    NONE Performed at [facility], [place] [address], [place], [place] [address]    Culture   Final    NO GROWTH Performed at [facility], [place] [address], [place], [place] [address]    Report Status 03/09/2021 FINAL  Final  Culture, blood (routine x 2)     Status: None (Preliminary result)   Collection Time: 03/10/21  3:48 AM   Specimen: BLOOD LEFT HAND  Result Value Ref Range Status   Specimen Description   Final    BLOOD LEFT HAND Performed at [place] [address] [place], [place] [address]    Special Requests   Final    BOTTLES DRAWN  AEROBIC AND ANAEROBIC Blood Culture adequate volume Performed at [place] [address] [place], [place] [address]    Culture  Setup Time PENDING  Incomplete   Culture   Final    NO GROWTH 2 DAYS Performed at [facility], [place] [address] [place], [place] [address]    Report Status PENDING  Incomplete  Culture, blood (routine x 2)     Status: None (Preliminary result)   Collection Time: 03/10/21  3:55 AM   Specimen: BLOOD LEFT FOREARM  Result Value Ref Range Status   Specimen Description   Final    BLOOD  LEFT FOREARM Performed at [facility], [place] [address] [place], [place] [address]    Special Requests   Final    BOTTLES DRAWN AEROBIC ONLY Blood Culture results may not be optimal due to an inadequate volume of blood received in culture bottles Performed at [place] [address] [place], [place] [address]    Culture   Final    NO GROWTH 2 DAYS Performed at [place] [address] [place], [place] [address]    Report Status PENDING  Incomplete  Culture, blood (routine x 2)     Status: None (Preliminary result)   Collection Time: 03/11/21  3:52 AM   Specimen: BLOOD LEFT HAND  Result Value Ref Range Status   Specimen Description   Final    BLOOD LEFT HAND Performed at [facility] [address], [place], [place] [address]    Special Requests   Final    BOTTLES DRAWN AEROBIC ONLY Blood Culture adequate volume Performed at [facility] [address], [place], [place] [address]    Culture   Final    NO GROWTH 1 DAY Performed at [facility], [place] [address] [place], [place] [address]    Report Status PENDING  Incomplete    Impression/Plan:  1. Bacteremia - 4/4 bottles positive with Staph aureus, methicillin-sensitive.  Repeat cultures ngtd.  TTE without vegetation.  I do think she will need a TEE at some point as she recovers.  I have requested a TEE,  which will be next week.  She denies any back pain at this time now that she is more alert and no significant neuro deficits on limited exam so less concern with disc-related infection.    2.  Confusion - improved today and able to converse.  Will continue to monitor.    3.  Thrush - unclear reason but clinically looks like thrush. Had recently been on prednisone for back pain.  I will check HIV for completeness.   Starting fluconazole, renally dosed.

## 2021-03-12 NOTE — Progress Notes (Signed)
Patient alert and interactive today, but with rambling, incoherent speech.  Visual hallucinations noted: repeatedly asks staff to remove things off of her bed that aren't there, asks about things dripping down the walls.  Refers to staff by the names of her children and granddaughter.  Patient given some juice with a straw under full supervision, but began coughing after drinking ~120 mL.  Angie Fava, RN

## 2021-03-12 NOTE — Assessment & Plan Note (Addendum)
Due to poor p.o. intake from altered mentation.  Have changed IV fluids to half-normal saline.  Started on D5W after half-normal saline not improving sodium.  Peaking at 157, sodium has since improved with D5W.

## 2021-03-12 NOTE — Progress Notes (Signed)
FLEET enema administered.  Patient had small Type 1 brown stool prior to administration.  No BM following enema.  MD notified.  Angie Fava, RN

## 2021-03-12 NOTE — Assessment & Plan Note (Addendum)
Started on fluconazole.  Slowly improving. - complete 14 day course

## 2021-03-12 NOTE — Progress Notes (Signed)
Speech Language Pathology Treatment: Dysphagia  Patient Details Name: [name] [name] MRN: [id number] DOB: September 17, 1945 Today's Date: 03/12/2021 Time: 1035-1100 SLP Time Calculation (min) (ACUTE ONLY): 25 min  Assessment / Plan / Recommendation Clinical Impression  Pt seen today to assess readiness for po diet - She was severely dysarthic upon entrance to room. She was able to state her full name but not location, condition.  She is significantly distracted and requires total cues to participate in oral care.  SLP Provided oral care over approx 30 minutes to aid in clearance of dried white tinged secretions of which pt was not aware or attempted to clear independently-= concerning for potential candidiasis.  Pt is very distracted and requires total cues and extra time to participate and allow oral care as she prefers to conduct her own oral care - but is too weak for it to be effective.  Pt's articulation improved significantly after oral care - - but she remains confused with disorientation, impaired attention, receptive and expressive language deficits.  Chronic throat clearing noted with oral care, concerning for secretions and/or water from oral care infiltrating larynx/trachea.  Infection control MD arrived to room, thus session was stopped. Pt is making progress re: mentation but remains confused, ? Encephalopathic.     HPI HPI: Pt is a 76 yo female adm 3 days ago with weakness after being found down by her son.  Pt with PMH + for anxiety, mood disorder - weaned from litihium in 2013, morbidly obese, colon cancer, recurrent falls, recent back pain, decreased po intake, breast cancer.  CT chest showed partial consolidations in the lower lobes could reflect atelectasis or mild pneumonia.  Per PT, pt was given water during her therapy session resulting in pt overtly coughing with intake.  Swallow evaluation was ordered.  She is currently on 3 liters oxygen via nasal cannula.  Swallow eval completed on  03/11/2021 and she was made NPO x ice chips.      SLP Plan  Continue with current plan of care      Recommendations for follow up therapy are one component of a multi-disciplinary discharge planning process, led by the attending physician.  Recommendations may be updated based on patient status, additional functional criteria and insurance authorization.    Recommendations  Diet recommendations: NPO (ice chips) Medication Administration: Via alternative means                Oral Care Recommendations: Oral care QID Follow Up Recommendations: Skilled nursing-short term rehab (<3 hours/day) Assistance recommended at discharge: Frequent or constant Supervision/Assistance SLP Visit Diagnosis: Dysphagia, oropharyngeal phase (R13.12) Plan: Continue with current plan of care           Vertell Limber, [place] Office [phone] Cell [phone]  03/12/2021, 11:40 AM

## 2021-03-13 DIAGNOSIS — G9341 Metabolic encephalopathy: Secondary | ICD-10-CM | POA: Diagnosis not present

## 2021-03-13 DIAGNOSIS — R7881 Bacteremia: Secondary | ICD-10-CM | POA: Diagnosis not present

## 2021-03-13 DIAGNOSIS — N179 Acute kidney failure, unspecified: Secondary | ICD-10-CM | POA: Diagnosis not present

## 2021-03-13 DIAGNOSIS — A4101 Sepsis due to Methicillin susceptible Staphylococcus aureus: Secondary | ICD-10-CM | POA: Diagnosis not present

## 2021-03-13 DIAGNOSIS — E870 Hyperosmolality and hypernatremia: Secondary | ICD-10-CM | POA: Diagnosis not present

## 2021-03-13 LAB — CBC WITH DIFFERENTIAL/PLATELET
Abs Immature Granulocytes: 2.63 10*3/uL — ABNORMAL HIGH (ref 0.00–0.07)
Basophils Absolute: 0.1 10*3/uL (ref 0.0–0.1)
Basophils Relative: 1 %
Eosinophils Absolute: 0.1 10*3/uL (ref 0.0–0.5)
Eosinophils Relative: 0 %
HCT: 31.8 % — ABNORMAL LOW (ref 36.0–46.0)
Hemoglobin: 10 g/dL — ABNORMAL LOW (ref 12.0–15.0)
Immature Granulocytes: 13 %
Lymphocytes Relative: 6 %
Lymphs Abs: 1.1 10*3/uL (ref 0.7–4.0)
MCH: 31.6 pg (ref 26.0–34.0)
MCHC: 31.4 g/dL (ref 30.0–36.0)
MCV: 100.6 fL — ABNORMAL HIGH (ref 80.0–100.0)
Monocytes Absolute: 1 10*3/uL (ref 0.1–1.0)
Monocytes Relative: 5 %
Neutro Abs: 14.6 10*3/uL — ABNORMAL HIGH (ref 1.7–7.7)
Neutrophils Relative %: 75 %
Platelets: 276 10*3/uL (ref 150–400)
RBC: 3.16 MIL/uL — ABNORMAL LOW (ref 3.87–5.11)
RDW: 14.8 % (ref 11.5–15.5)
WBC: 19.6 10*3/uL — ABNORMAL HIGH (ref 4.0–10.5)
nRBC: 0 % (ref 0.0–0.2)

## 2021-03-13 LAB — COMPREHENSIVE METABOLIC PANEL
ALT: 32 U/L (ref 0–44)
AST: 89 U/L — ABNORMAL HIGH (ref 15–41)
Albumin: 2 g/dL — ABNORMAL LOW (ref 3.5–5.0)
Alkaline Phosphatase: 87 U/L (ref 38–126)
Anion gap: 11 (ref 5–15)
BUN: 63 mg/dL — ABNORMAL HIGH (ref 8–23)
CO2: 22 mmol/L (ref 22–32)
Calcium: 8.8 mg/dL — ABNORMAL LOW (ref 8.9–10.3)
Chloride: 122 mmol/L — ABNORMAL HIGH (ref 98–111)
Creatinine, Ser: 1.57 mg/dL — ABNORMAL HIGH (ref 0.44–1.00)
GFR, Estimated: 34 mL/min — ABNORMAL LOW (ref >60)
Glucose, Bld: 233 mg/dL — ABNORMAL HIGH (ref 70–99)
Potassium: 3.7 mmol/L (ref 3.5–5.1)
Sodium: 155 mmol/L — ABNORMAL HIGH (ref 135–145)
Total Bilirubin: 0.7 mg/dL (ref 0.3–1.2)
Total Protein: 6.4 g/dL — ABNORMAL LOW (ref 6.5–8.1)

## 2021-03-13 LAB — GLUCOSE, CAPILLARY
Glucose-Capillary: 168 mg/dL — ABNORMAL HIGH (ref 70–99)
Glucose-Capillary: 299 mg/dL — ABNORMAL HIGH (ref 70–99)
Glucose-Capillary: 209 mg/dL — ABNORMAL HIGH (ref 70–99)
Glucose-Capillary: 205 mg/dL — ABNORMAL HIGH (ref 70–99)

## 2021-03-13 LAB — MAGNESIUM: Magnesium: 2.7 mg/dL — ABNORMAL HIGH (ref 1.7–2.4)

## 2021-03-13 LAB — BRAIN NATRIURETIC PEPTIDE: B Natriuretic Peptide: 103.7 pg/mL — ABNORMAL HIGH (ref 0.0–100.0)

## 2021-03-13 NOTE — Progress Notes (Signed)
Speech Language Pathology Treatment: Dysphagia  Patient Details Name: [name] [name] MRN: [id number] DOB: 1945/08/19 Today's Date: 03/13/2021 Time: 7425-9563 SLP Time Calculation (min) (ACUTE ONLY): 25 min  Assessment / Plan / Recommendation Clinical Impression  [place] pt seen for dysphagia goals - including least restrictive diet tolerance with compensation strategies and max cues.  Pt alert but disoriented with poor sustained attention during session. She is distracted by intrinsic concerns during entire session.  She was wiling to attempt advanced textures including consuming bites of sandwich, graham crackers, applesauce and thin liquids. Pt is so distracted despite total cues that she speaks while holding food in her mouth without awarnees of need to clear.  Prolonged masticaiton with right oral pocketing present and did not clear despite pt taking several boluses (x8) of applesauce and consuming liquids.  She is more alert today and did not demonstrate any coughing with all po intake - including all thin liquids *as RN observed yesterday afternoon.  RN today also denies pt coughing with liquid intake.  At this time, recommend to advance to full liquids to allow for higher nutrition foods and SlP will follow up for readiness for dietary advancement.  Anticipate  pt will tolerate advanced textures with significantly improved mentation.  Pt may benefit from intermittent supervision to allow her to self feed as able as this may decrease pressure for her to speak to staff. [name]    HPI HPI: Pt is a 76 yo female adm 3 days ago with weakness after being found down by her son.  Pt with PMH + for anxiety, mood disorder - weaned from litihium in 2013, morbidly obese, colon cancer, recurrent falls, recent back pain, decreased po intake, breast cancer.  CT chest showed partial consolidations in the lower lobes could reflect atelectasis or mild pneumonia.  Per PT, pt was given water during her therapy session  resulting in pt overtly coughing with intake.  Swallow evaluation was ordered.  She is currently on 3 liters oxygen via nasal cannula.  Swallow eval completed on 03/11/2021 and she was made NPO x ice chips.  Pt was placed on a clear liquid diet following dysphagia treatment session.  Management of dysphagia during hospital coarse has been ongoing.      SLP Plan  Continue with current plan of care      Recommendations for follow up therapy are one component of a multi-disciplinary discharge planning process, led by the attending physician.  Recommendations may be updated based on patient status, additional functional criteria and insurance authorization.    Recommendations  Diet recommendations: Thin liquid (full liquids) Liquids provided via: Cup;Straw Medication Administration: Crushed with puree Supervision: Intermittent supervision to cue for compensatory strategies Compensations: Other (Comment);Slow rate;Small sips/bites (oral suction after po if needed) Postural Changes and/or Swallow Maneuvers: Seated upright 90 degrees;Upright 30-60 min after meal                Oral Care Recommendations: Oral care QID Follow Up Recommendations: Skilled nursing-short term rehab (<3 hours/day) Assistance recommended at discharge: Frequent or constant Supervision/Assistance SLP Visit Diagnosis: Dysphagia, oropharyngeal phase (R13.12) Plan: Continue with current plan of care          [name] [name], [place] Cell [phone]  [name] [name]  03/13/2021, 4:57 PM

## 2021-03-13 NOTE — Plan of Care (Signed)
  Problem: Nutrition: Goal: Adequate nutrition will be maintained Outcome: Progressing   Problem: Coping: Goal: Level of anxiety will decrease Outcome: Progressing   Problem: Pain Managment: Goal: General experience of comfort will improve Outcome: Progressing   

## 2021-03-13 NOTE — Progress Notes (Signed)
° ° °  [facility] has been requested to perform a transesophageal echocardiogram on this patient for bacteremia. Chart reviewed. Notably patient was diagnosed with thrush yesterday and has had issues with dysphagia. She continues to have confusion. I visited patient at bedside to briefly introduce myself before calling her family to consent her as she is not consentable. She is awake and alert, receiving a bath, and is adamant that she does not want any procedures done at this time. I reviewed clinical scenario with Dr. Sallyanne Kuster. He feels she is presently a poor candidate to proceed with TEE on Monday and would recommend giving time for her to clinically improve from her thrush, dysphagia, and mental status before re-scheduling. [facility] aware to cancel TEE for Monday. I sent message to [facility] notifying them of this update and requested they reach out to [place] on Monday for update on how patient is doing clinically so that it can be decided at that time whether she is ready for rescheduling.  Charlie Pitter, PA-C 03/13/2021 11:37 AM

## 2021-03-13 NOTE — Progress Notes (Signed)
[facility] Progress Note  Patient: [name] [name]    TXM:[id number]  DOA: 03/08/2021    Date of Service: the patient was seen and examined on 03/13/2021  Brief hospital course: 76 year old female with past medical history of hypothyroidism, diastolic CHF, stage IIIb chronic kidney disease and mood disorder presented to the emergency room on 03/08/2021 after multiple falls at home.  Although poor historian, patient is noted to have slow decline.  In the emergency room, found to be hypoxic and septic from pneumonia.  Following admission, blood cultures positive for MSSA.  Work-up ongoing to ensure source.  ID plans for TEE next week.  Although patient less somnolent, remains confused.  Poor p.o. intake and sodium has been trending upward.  Assessment and Plan: * Sepsis ([place])- (present on admission) - Febrile, tachycardia, tachypnea, leukocytosis; presumed lung source given opacities on CT chest with possible aspiration however now having worsening left shoulder pain (s/p L deltoid kenalog injection on 03/06/21) and ongoing low back pain - Given vancomycin, cefepime, Flagyl in the ER - No significant risk factors for MDRO, then changed to CTX/azithro after blood cultures grew MSSA, will change her to Unasyn.  No further fevers times greater than 24 hours.  White blood cell count finally starting to come down, perhaps in part due to hypernatremia?  ID following.  Plan for TEE next week.  Attempting to get MRI of thoracic/lumbar spine under general sedation  Bacteremia- (present on admission) - 4/4 bottles positive on admission (1/22) with MSSA. Differential for etiology at this time includes recent outpt steroid injection on 1/20 to Left deltoid vs vertebral infection (worsening lower back pain) vs spontaneous bacteremia vs translocation from pulmonary source - abx changed to Unasyn which should cover presumed sources for now - obtain MRI left shoulder. ID has ordered MRI T/L spine.  Unfortunately,  patient too agitated to stay still, so trying to reorder with general anesthesia.   - TTE on 1/23: some poor images but no obvious vegetations (EF 60-65%, mild LVH).  TEE next week Blood cultures from 1/24 1/25 with no growth to date  Acute respiratory failure with hypoxia ([place])- (present on admission) - Presumed due to underlying pneumonia/aspiration -Continue oxygen and wean as able - [name] sepsis and bacteremia work-up otherwise  Hypernatremia Due to poor p.o. intake from altered mentation.  Have changed IV fluids to half-normal saline.  Started on D5W after half-normal saline not improving sodium.  Sodium down from 157 to 155.  Continue D5W, rechecking sodium this afternoon  Acute renal failure superimposed on stage 3b chronic kidney disease ([place])- (present on admission) - patient has history of CKD3b. Baseline creat ~ 1.3, eGFR 38 -Creatinine on admission at 2.47 and with fluids has been slowly improving.  Has been trending upward slightly due to poor p.o. intake  Acute metabolic encephalopathy Patient more awake now.  Some of this may have been due to uremia and hypernatremia.  Chronic diastolic heart failure ([place])- (present on admission) - No S/S exacerbation on exam.  Patient is volume depleted clinically - Continue fluids.  Noted mildly elevated BNP.  Continue fluids and will recheck BMP in the morning. -Last echo reviewed from 11/06/2020: Moderate MR, impaired relaxation, EF 65% - TTE repeated on 1/23, EF 60-65%, mild LVH; trivial MR on read; normal diastology   Hypothyroidism- (present on admission) - TSH, 0.429 - continue Synthroid  DMII (diabetes mellitus, type 2) (HCC) - History of prediabetes, no A1c on file - A1c on admission 6.6% - Treat with  sliding scale for now and diet control  ° °Elevated LFTs- (present on admission) °Suspect secondary to shock liver from sepsis.  Transaminases slowly improving ° °Hyponatremia-resolved as of 03/09/2021, (present on admission) °-  presumed hypovolemic hyponatremia.  Resolved with IV fluids ° °Morbid obesity (HCC)- (present on admission) °Meets criteria for BMI greater than 35+ comorbidities of heart failure and diabetes and hypertension ° °Thrush, oral °Started on fluconazole. ° °Fall at home, initial encounter °- Patient found on floor by EMS on their arrival.  Patient states floor is carpeted although she cannot provide any further collateral information regarding her fall or length of time on the floor °- Given renal failure on admission which is still likely prerenal, check CK °- Multiple imaging studies on admission including right wrist x-ray, CT head, CT cervical spine, and CT chest: All with no acute abnormalities but does have 4 mm anterolisthesis C3 on C4 °- continue pain control °- will consult PT once more stable  ° °Normocytic anemia- (present on admission) °- No complaints or signs of bleeding.  No recent lab work other than July 2021 (12.3 g/dL at that time).  Hemoglobin stable since admission. ° ° ° °Body mass index is 36.81 kg/m².  °  °   ° °Consultants: °Infectious disease ° °Procedures: °Echocardiogram-no vegetations noted °Plan for TEE next week if patient amenable ° °Antimicrobials: °Continue IV Ancef ° °Code Status: Full code ° ° °Subjective: Patient more awake and interactive, still confused.  States breathing is okay. ° °Objective: °Noted increased tachypnea, elevations in blood pressure °Vitals:  ° 03/13/21 0137 03/13/21 1242  °BP: (!) 159/65 (!) 151/82  °Pulse: 86 95  °Resp: 20 16  °Temp: 98.2 °F (36.8 °C) 98.2 °F (36.8 °C)  °SpO2: 98% 99%  ° ° °Intake/Output Summary (Last 24 hours) at 03/13/2021 1417 °Last data filed at 03/13/2021 1300 °Gross per 24 hour  °Intake 2150.92 ml  °Output 1100 ml  °Net 1050.92 ml  ° ° °Filed Weights  ° 03/08/21 2041  °Weight: 85.5 kg  ° °Body mass index is 36.81 kg/m². ° °Exam: ° °General: Awake, anxious, fatigued °HEENT: Normocephalic and atraumatic, mucous membranes slightly dry, noted  oral thrush on her tongue °Cardiovascular: Regular rate and rhythm, S1-S2 °Respiratory: Poor inspiratory effort, decreased breath sounds bibasilar °Abdomen: Soft,?  Nontender, nondistended, hypoactive bowel sounds °Musculoskeletal: No clubbing or cyanosis or edema.  Right wrist tender to flexion °Skin: No skin breaks, tears or lesions °Psychiatry: Lethargic °Neurology: No focal deficits ° °Data Reviewed: °Noted sodium at 157 yesterday afternoon down to 155 today.  Creatinine slightly trending upward at 1.57.  White blood cell count with slight improvement down to 19.6 from 22 yesterday ° °Disposition:  °Status is: Inpatient ° °Remains inpatient appropriate because: Continued work-up and treatment for infection ° ° ° °Family Communication: Left message for daughter °DVT Prophylaxis: °heparin injection 5,000 Units Start: 03/08/21 2200 ° ° ° °Author: ° K  ,MD °03/13/2021 2:17 PM ° °To reach On-call, [name] care teams to locate the attending and reach out via [email or web address]. °Between 7PM-7AM, please contact night-coverage °If you still have difficulty reaching the attending provider, please page the DOC (Director on Call) for [facility] on amion for assistance. ° °

## 2021-03-13 NOTE — Anesthesia Preprocedure Evaluation (Addendum)
Anesthesia Evaluation  Patient identified by MRN, date of birth, ID band Patient awake    Reviewed: Allergy & Precautions, H&P , NPO status , Patient's Chart, lab work & pertinent test results  Airway Mallampati: III  TM Distance: >3 FB Neck ROM: Full    Dental  (+) Dental Advisory Given, Teeth Intact   Pulmonary neg pulmonary ROS,    Pulmonary exam normal breath sounds clear to auscultation       Cardiovascular Normal cardiovascular exam Rhythm:Regular Rate:Normal  Echo 03/09/2021 1. Left ventricular ejection fraction, by estimation, is 60 to 65%. The left ventricle has normal function. The left ventricle has no regional wall motion abnormalities. There is mild left ventricular hypertrophy. Left ventricular diastolic parameters were normal.  2. Right ventricular systolic function is normal. The right ventricular size is normal.  3. The mitral valve is normal in structure. Trivial mitral valve regurgitation. No evidence of mitral stenosis.  4. The aortic valve is normal in structure. Aortic valve regurgitation is not visualized. No aortic stenosis is present.  5. The inferior vena cava is normal in size with greater than 50% respiratory variability, suggesting right atrial pressure of 3 mmHg.    Neuro/Psych PSYCHIATRIC DISORDERS Anxiety Bipolar Disorder negative neurological ROS     GI/Hepatic negative GI ROS, Neg liver ROS,   Endo/Other  diabetesHypothyroidism   Renal/GU Renal InsufficiencyRenal disease     Musculoskeletal   Abdominal (+) + obese,   Peds  Hematology negative hematology ROS (+) Blood dyscrasia, anemia ,   Anesthesia Other Findings Breast Cancer  Reproductive/Obstetrics negative OB ROS                           Anesthesia Physical  Anesthesia Plan  ASA: 3  Anesthesia Plan: General   Post-op Pain Management:  Regional for Post-op pain and Minimal or no pain anticipated    Induction: Intravenous  PONV Risk Score and Plan: 3 and Ondansetron, Dexamethasone, Midazolam and Treatment may vary due to age or medical condition  Airway Management Planned: LMA and Oral ETT  Additional Equipment: None  Intra-op Plan:   Post-operative Plan: Extubation in OR  Informed Consent: I have reviewed the patients History and Physical, chart, labs and discussed the procedure including the risks, benefits and alternatives for the proposed anesthesia with the patient or authorized representative who has indicated his/her understanding and acceptance.     Dental advisory given  Plan Discussed with: CRNA  Anesthesia Plan Comments:       Anesthesia Quick Evaluation

## 2021-03-13 NOTE — Evaluation (Signed)
Occupational Therapy Evaluation Patient Details Name: [name] [name] MRN: [id number] DOB: Jul 03, 1945 Today's Date: 03/13/2021   History of Present Illness 76 yo female presents to ED on 1/22 with multiple falls, min confusion, hypoxia; workup for sepsis, ARF. CT chest showed bilateral pleural effusions, bilateral lower lobe bronchial wall thickening with consolidations concerning for possible pneumonia, possible 7 mm left apical lung nodule.  recent outpatient Kenalog injection in the left deltoid on 03/06/2021. PMH anxiety, dCHF, HLD, hypothyroidism, IBS, mood disorder, prediabetes, CKD 3b.   Clinical Impression   Patient is A/O x3 however very tangential needing max cues to redirect and follow through with 1 step directions. Patient presenting with global weakness and impaired L UE use with AAROM shoulder flexion ~130, unable to maintain without external support. Patient also needing min A to take sip of water from cup due to poor grip strength in dominant R hand having to use both hands to assist with stabilizing cup. Attempted rolling toward edge of bed with max cues for technique however patient minimally able to perform needing total A to complete. Recommend continued acute OT services to maximize patient safety and independence with self care in order to facilitate D/C to venue listed below.     Recommendations for follow up therapy are one component of a multi-disciplinary discharge planning process, led by the attending physician.  Recommendations may be updated based on patient status, additional functional criteria and insurance authorization.   Follow Up Recommendations  Skilled nursing-short term rehab (<3 hours/day)    Assistance Recommended at Discharge Frequent or constant Supervision/Assistance  Patient can return home with the following Two people to help with walking and/or transfers;Two people to help with bathing/dressing/bathroom;Assistance with cooking/housework;Assistance  with feeding;Direct supervision/assist for medications management;Direct supervision/assist for financial management;Assist for transportation;Help with stairs or ramp for entrance    Functional Status Assessment  Patient has had a recent decline in their functional status and demonstrates the ability to make significant improvements in function in a reasonable and predictable amount of time.  Equipment Recommendations  Other (comment) (unsure of home DME)       Precautions / Restrictions Precautions Precautions: Fall Restrictions Weight Bearing Restrictions: No      Mobility Bed Mobility Overal bed mobility: Needs Assistance Bed Mobility: Rolling Rolling: Total assist         General bed mobility comments: Patient needing max cues to stay on task and follow directions to assess rolling. Patient minimally able to use R UE on bed rail or slide legs towards edge of bed needing total A    Transfers                   General transfer comment: NT- difficulty with direction following and needing total A for rolling          ADL either performed or assessed with clinical judgement   ADL Overall ADL's : Needs assistance/impaired Eating/Feeding: Minimal assistance;Bed level Eating/Feeding Details (indicate cue type and reason): to bring cup closer to face, patient having difficulty gripping onto cup with dominant hand needing to use both hands Grooming: Moderate assistance;Bed level   Upper Body Bathing: Moderate assistance;Bed level   Lower Body Bathing: Total assistance;Bed level   Upper Body Dressing : Maximal assistance;Bed level   Lower Body Dressing: Total assistance;Bed level     Toilet Transfer Details (indicate cue type and reason): unable, needing total A to roll Toileting- Clothing Manipulation and Hygiene: Total assistance;Bed level  General ADL Comments: unsure of patient's baseline however needing significant assistance with UB/LB ADLs due  to weakness, cognition, decreased activity tolerance     Vision Baseline Vision/History: 1 Wears glasses              Pertinent Vitals/Pain Pain Assessment Pain Assessment: Faces Faces Pain Scale: Hurts little more Pain Location: R hand Pain Descriptors / Indicators: Aching, Grimacing Pain Intervention(s): Monitored during session     Hand Dominance Right   Extremity/Trunk Assessment Upper Extremity Assessment Upper Extremity Assessment: RUE deficits/detail;LUE deficits/detail RUE Deficits / Details: AROM WFL, elbow strength 4-/5 LUE Deficits / Details: limited AROM, with AAROM patient able to raise shoulder ~130 unable to maintain against gravity without external assist   Lower Extremity Assessment Lower Extremity Assessment: Defer to PT evaluation       Communication Communication Communication: No difficulties   Cognition Arousal/Alertness: Awake/alert Behavior During Therapy: WFL for tasks assessed/performed Overall Cognitive Status: No family/caregiver present to determine baseline cognitive functioning                                 General Comments: Patient is A/O x3 however very tangential needing frequent cues to redirect back to task                Home Living Family/patient expects to be discharged to:: Private residence Living Arrangements: Spouse/significant other;Children;Other (Comment) Available Help at Discharge: Family Type of Home: House       Home Layout: One level         Bathroom Toilet: Standard         Additional Comments: Info taken from PT eval, patient unreliable narrator      Prior Functioning/Environment Prior Level of Function : Patient poor historian/Family not available             Mobility Comments: unsure of pt baseline mobility, per chart review pt with increased falls and weakness over the course of 4 days prior to arrival to ED ADLs Comments: Per RN Yong Channel, pt assists her husband with  dementia        OT Problem List: Decreased strength;Decreased range of motion;Decreased activity tolerance;Impaired balance (sitting and/or standing);Decreased cognition;Decreased safety awareness;Pain;Obesity;Impaired UE functional use      OT Treatment/Interventions: Self-care/ADL training;Therapeutic exercise;DME and/or AE instruction;Therapeutic activities;Cognitive remediation/compensation;Patient/family education;Balance training    OT Goals(Current goals can be found in the care plan section) Acute Rehab OT Goals Patient Stated Goal: "use bathroom" OT Goal Formulation: With patient Time For Goal Achievement: 03/27/21 Potential to Achieve Goals: Good  OT Frequency: Min 2X/week       AM-PAC OT "6 Clicks" Daily Activity     Outcome Measure Help from another person eating meals?: A Little Help from another person taking care of personal grooming?: A Little Help from another person toileting, which includes using toliet, bedpan, or urinal?: Total Help from another person bathing (including washing, rinsing, drying)?: A Lot Help from another person to put on and taking off regular upper body clothing?: A Lot Help from another person to put on and taking off regular lower body clothing?: Total 6 Click Score: 12   End of Session Nurse Communication: Mobility status  Activity Tolerance: Patient limited by fatigue Patient left: in bed;with call bell/phone within reach;with bed alarm set  OT Visit Diagnosis: Unsteadiness on feet (R26.81);Other abnormalities of gait and mobility (R26.89);Muscle weakness (generalized) (M62.81);Other symptoms and signs involving cognitive function  Time: 0735-4301 OT Time Calculation (min): 16 min Charges:  OT General Charges $OT Visit: 1 Visit OT Evaluation $OT Eval Low Complexity: [place] OT OT pager: [place] 03/13/2021, 12:28 PM

## 2021-03-13 NOTE — Progress Notes (Signed)
PT Cancellation Note  Patient Details Name: [name] [name] MRN: [id number] DOB: Jul 14, 1945   Cancelled Treatment:     attempted twice to see pt today.  Pt has been evaluated with rec for SNF.  Will continue to follow   Nathanial Rancher 03/13/2021, 4:22 PM

## 2021-03-13 NOTE — Progress Notes (Addendum)
°    [facility]   Reason for visit: Follow up on bacteremia  Interval History: TEE requested but with dysphagia, will postpone until next week and reassess MRI under sedation plans noted Repeat blood cultures remain ngtd  Physical Exam: Constitutional:  Vitals:   03/13/21 0137 03/13/21 1242  BP: (!) 159/65 (!) 151/82  Pulse: 86 95  Resp: 20 16  Temp: 98.2 F (36.8 C) 98.2 F (36.8 C)  SpO2: 98% 99%   patient appears in NAD  Impression: bacteremia, confusion, thrush  Plan: 1.  MRI planned 2.  Continue cefazolin 3.  Continue fluconazole 4.  TEE when able  Dr. Candiss Norse will monitor results over the weekend Dr. Gale Journey on Monday

## 2021-03-14 ENCOUNTER — Ambulatory Visit (HOSPITAL_COMMUNITY)
Admit: 2021-03-14 | Discharge: 2021-03-14 | Disposition: A | Discharge status: Home / Self Care | Payer: Medicare Other | Attending: Internal Medicine | Admitting: Internal Medicine

## 2021-03-14 ENCOUNTER — Inpatient Hospital Stay (HOSPITAL_COMMUNITY): Payer: Medicare Other | Admitting: Anesthesiology

## 2021-03-14 ENCOUNTER — Encounter (HOSPITAL_COMMUNITY): Payer: Self-pay | Admitting: Internal Medicine

## 2021-03-14 ENCOUNTER — Encounter (HOSPITAL_COMMUNITY)
Admission: EM | Disposition: A | Discharge status: Skilled Nursing Facility | Payer: Self-pay | Source: Home / Self Care | Attending: Internal Medicine

## 2021-03-14 DIAGNOSIS — N179 Acute kidney failure, unspecified: Secondary | ICD-10-CM | POA: Diagnosis not present

## 2021-03-14 DIAGNOSIS — A4101 Sepsis due to Methicillin susceptible Staphylococcus aureus: Secondary | ICD-10-CM | POA: Diagnosis not present

## 2021-03-14 DIAGNOSIS — M546 Pain in thoracic spine: Secondary | ICD-10-CM | POA: Diagnosis not present

## 2021-03-14 DIAGNOSIS — G9341 Metabolic encephalopathy: Secondary | ICD-10-CM | POA: Diagnosis not present

## 2021-03-14 DIAGNOSIS — M4316 Spondylolisthesis, lumbar region: Secondary | ICD-10-CM | POA: Diagnosis not present

## 2021-03-14 DIAGNOSIS — M48061 Spinal stenosis, lumbar region without neurogenic claudication: Secondary | ICD-10-CM | POA: Diagnosis not present

## 2021-03-14 DIAGNOSIS — E870 Hyperosmolality and hypernatremia: Secondary | ICD-10-CM | POA: Diagnosis not present

## 2021-03-14 HISTORY — PX: RADIOLOGY WITH ANESTHESIA: SHX6223

## 2021-03-14 LAB — COMPREHENSIVE METABOLIC PANEL
ALT: 14 U/L (ref 0–44)
AST: 44 U/L — ABNORMAL HIGH (ref 15–41)
Albumin: 2.1 g/dL — ABNORMAL LOW (ref 3.5–5.0)
Alkaline Phosphatase: 76 U/L (ref 38–126)
Anion gap: 9 (ref 5–15)
BUN: 59 mg/dL — ABNORMAL HIGH (ref 8–23)
CO2: 19 mmol/L — ABNORMAL LOW (ref 22–32)
Calcium: 8.4 mg/dL — ABNORMAL LOW (ref 8.9–10.3)
Chloride: 114 mmol/L — ABNORMAL HIGH (ref 98–111)
Creatinine, Ser: 1.67 mg/dL — ABNORMAL HIGH (ref 0.44–1.00)
GFR, Estimated: 32 mL/min — ABNORMAL LOW (ref >60)
Glucose, Bld: 233 mg/dL — ABNORMAL HIGH (ref 70–99)
Potassium: 3.8 mmol/L (ref 3.5–5.1)
Sodium: 142 mmol/L (ref 135–145)
Total Bilirubin: 0.5 mg/dL (ref 0.3–1.2)
Total Protein: 6.1 g/dL — ABNORMAL LOW (ref 6.5–8.1)

## 2021-03-14 LAB — CBC
HCT: 31 % — ABNORMAL LOW (ref 36.0–46.0)
Hemoglobin: 10.1 g/dL — ABNORMAL LOW (ref 12.0–15.0)
MCH: 31.9 pg (ref 26.0–34.0)
MCHC: 32.6 g/dL (ref 30.0–36.0)
MCV: 97.8 fL (ref 80.0–100.0)
Platelets: 276 10*3/uL (ref 150–400)
RBC: 3.17 MIL/uL — ABNORMAL LOW (ref 3.87–5.11)
RDW: 14.6 % (ref 11.5–15.5)
WBC: 17.9 10*3/uL — ABNORMAL HIGH (ref 4.0–10.5)
nRBC: 0 % (ref 0.0–0.2)

## 2021-03-14 LAB — GLUCOSE, CAPILLARY
Glucose-Capillary: 195 mg/dL — ABNORMAL HIGH (ref 70–99)
Glucose-Capillary: 208 mg/dL — ABNORMAL HIGH (ref 70–99)
Glucose-Capillary: 147 mg/dL — ABNORMAL HIGH (ref 70–99)
Glucose-Capillary: 132 mg/dL — ABNORMAL HIGH (ref 70–99)
Glucose-Capillary: 121 mg/dL — ABNORMAL HIGH (ref 70–99)

## 2021-03-14 LAB — BRAIN NATRIURETIC PEPTIDE: B Natriuretic Peptide: 388.7 pg/mL — ABNORMAL HIGH (ref 0.0–100.0)

## 2021-03-14 LAB — HIV ANTIBODY (ROUTINE TESTING W REFLEX): HIV Screen 4th Generation wRfx: NONREACTIVE

## 2021-03-14 SURGERY — MRI WITH ANESTHESIA
Anesthesia: General

## 2021-03-14 MED ORDER — PHENYLEPHRINE 40 MCG/ML (10ML) SYRINGE FOR IV PUSH (FOR BLOOD PRESSURE SUPPORT)
PREFILLED_SYRINGE | INTRAVENOUS | Status: DC | PRN
  Administered 2021-03-14: 80 ug via INTRAVENOUS
  Administered 2021-03-14: 40 ug via INTRAVENOUS

## 2021-03-14 MED ORDER — LACTATED RINGERS IV SOLN: INTRAVENOUS | Status: DC | PRN

## 2021-03-14 MED ORDER — CEFAZOLIN SODIUM-DEXTROSE 2-4 GM/100ML-% IV SOLN
2.0000 g | Freq: Two times a day (BID) | INTRAVENOUS | Status: DC
  Administered 2021-03-14 – 2021-03-15 (×2): 2 g via INTRAVENOUS
  Filled 2021-03-14 (×3): qty 100

## 2021-03-14 MED ORDER — PROPOFOL 10 MG/ML IV BOLUS
INTRAVENOUS | Status: DC | PRN
  Administered 2021-03-14: 80 mg via INTRAVENOUS
  Administered 2021-03-14: 20 mg via INTRAVENOUS

## 2021-03-14 MED ORDER — ONDANSETRON HCL 4 MG/2ML IJ SOLN
INTRAMUSCULAR | Status: DC | PRN
Start: 2021-03-14 — End: 2021-03-14
  Administered 2021-03-14: 4 mg via INTRAVENOUS

## 2021-03-14 MED ORDER — ROCURONIUM BROMIDE 10 MG/ML (PF) SYRINGE
PREFILLED_SYRINGE | INTRAVENOUS | Status: DC | PRN
  Administered 2021-03-14: 50 mg via INTRAVENOUS

## 2021-03-14 MED ORDER — GADOBUTROL 1 MMOL/ML IV SOLN
8.5000 mL | Freq: Once | INTRAVENOUS | Status: AC | PRN
  Administered 2021-03-14: 8.5 mL via INTRAVENOUS

## 2021-03-14 MED ORDER — SUGAMMADEX SODIUM 200 MG/2ML IV SOLN
INTRAVENOUS | Status: DC | PRN
  Administered 2021-03-14 (×2): 100 mg via INTRAVENOUS

## 2021-03-14 MED ORDER — LIDOCAINE 2% (20 MG/ML) 5 ML SYRINGE
INTRAMUSCULAR | Status: DC | PRN
  Administered 2021-03-14: 70 mg via INTRAVENOUS

## 2021-03-14 MED ORDER — PHENYLEPHRINE HCL-NACL 20-0.9 MG/250ML-% IV SOLN
INTRAVENOUS | Status: DC | PRN
  Administered 2021-03-14: 30 ug/min via INTRAVENOUS

## 2021-03-14 NOTE — Progress Notes (Signed)
Patient returning to [facility] via care[facility]. Patient alert and disoriented at  baseline. Report given to Maryruth Eve, RN.

## 2021-03-14 NOTE — Progress Notes (Signed)
[facility] Progress Note  Patient: [name] [name]    NGE:[id number]  DOA: 03/08/2021    Date of Service: the patient was seen and examined on 03/14/2021  Brief hospital course: 76 year old female with past medical history of hypothyroidism, diastolic CHF, stage IIIb chronic kidney disease and mood disorder presented to the emergency room on 03/08/2021 after multiple falls at home.  Although poor historian, patient is noted to have slow decline.  In the emergency room, found to be hypoxic and septic from pneumonia.  Following admission, blood cultures positive for MSSA.  Work-up ongoing to ensure source.  ID plans for TEE next week.  Although patient less somnolent, remains confused.  Due to poor p.o. intake, patient developed hypernatremia which was treated with D5W.  Patient going for MRI of thoracic/lumbar spine today under general anesthesia.  Assessment and Plan: * Sepsis ([place])- (present on admission) - Febrile, tachycardia, tachypnea, leukocytosis; presumed lung source given opacities on CT chest with possible aspiration however now having worsening left shoulder pain (s/p L deltoid kenalog injection on 03/06/21) and ongoing low back pain.  White count has been steadily improving for several days. - Given vancomycin, cefepime, Flagyl in the ER - No significant risk factors for MDRO, then changed to CTX/azithro after blood cultures grew MSSA, will change her to Unasyn.  No further fevers times greater than 24 hours.  ID following.  Plan for TEE next week.  Attempting to get MRI of thoracic/lumbar spine today under general sedation  Bacteremia- (present on admission) - 4/4 bottles positive on admission (1/22) with MSSA. Differential for etiology at this time includes recent outpt steroid injection on 1/20 to Left deltoid vs vertebral infection (worsening lower back pain) vs spontaneous bacteremia vs translocation from pulmonary source - abx changed to Unasyn which should cover presumed sources  for now - obtain MRI left shoulder. ID has ordered MRI T/L spine.  Unfortunately, patient too agitated to stay still, so trying to reorder with general anesthesia.   - TTE on 1/23: some poor images but no obvious vegetations (EF 60-65%, mild LVH).  TEE next week Blood cultures from 1/24 1/25 with no growth to date  Acute respiratory failure with hypoxia (HCC)- (present on admission) - Presumed due to underlying pneumonia/aspiration -Continue oxygen and wean as able - See sepsis and bacteremia work-up otherwise  Acute renal failure superimposed on stage 3b chronic kidney disease ([place])- (present on admission) - patient has history of CKD3b. Baseline creat ~ 1.3, eGFR 38 -Creatinine on admission at 2.47 and with fluids has been slowly improving.  Has been trending upward slightly due to poor p.o. intake  Hypernatremia-resolved as of 03/14/2021 Due to poor p.o. intake from altered mentation.  Have changed IV fluids to half-normal saline.  Started on D5W after half-normal saline not improving sodium.  Peaking at 157, sodium has since improved with D5W.  Acute metabolic encephalopathy Patient more awake now.  Some of this may have been due to uremia and hypernatremia.  Chronic diastolic heart failure ([place])- (present on admission) - No S/S exacerbation on exam.  Patient is volume depleted clinically Because of need for D5W, has been getting fluids at 125 cc an hour.  BNP elevated given additional fluids.  We will recheck labs in the morning and then stop fluids and then start Lasix. -Last echo reviewed from 11/06/2020: Moderate MR, impaired relaxation, EF 65% - TTE repeated on 1/23, EF 60-65%, mild LVH; trivial MR on read; normal diastology   Hypothyroidism- (present on admission) -  TSH, 0.429 - continue Synthroid  DMII (diabetes mellitus, type 2) (HCC) - History of prediabetes, no A1c on file - A1c on admission 6.6% - Treat with sliding scale for now and diet control  CBGs have been  trending higher in the low 200s due to being on D5W  Elevated LFTs- (present on admission) Suspect secondary to shock liver from sepsis.  Transaminases slowly improving  Hyponatremia-resolved as of 03/09/2021, (present on admission) - presumed hypovolemic hyponatremia.  Resolved with IV fluids  Morbid obesity ([place])- (present on admission) Meets criteria for BMI greater than 35+ comorbidities of heart failure and diabetes and hypertension  Thrush, oral Started on fluconazole.  Improved from previous day  Fall at home, initial encounter - Patient found on floor by EMS on their arrival.  Patient states floor is carpeted although she cannot provide any further collateral information regarding her fall or length of time on the floor - Given renal failure on admission which is still likely prerenal, check CK - Multiple imaging studies on admission including right wrist x-ray, CT head, CT cervical spine, and CT chest: All with no acute abnormalities but does have 4 mm anterolisthesis C3 on C4 - continue pain control - will consult PT once more stable   Normocytic anemia- (present on admission) - No complaints or signs of bleeding.  No recent lab work other than July 2021 (12.3 g/dL at that time).  Hemoglobin stable since admission.    Body mass index is 36.81 kg/m.        Consultants: Infectious disease  Procedures: Echocardiogram-no vegetations noted Plan for TEE next week if patient amenable MRI under general anesthesia planned 1/28  Antimicrobials: Continue IV Ancef  Code Status: Full code   Subjective: Patient more awake.  Complains of dry tongue (thrush) and wanting to eat or drink something. (She is n.p.o. for MRI)  Objective: Noted increased tachypnea, elevations in blood pressure Vitals:   03/14/21 0529 03/14/21 1243  BP: (!) 147/64 (!) 143/75  Pulse: 88 87  Resp: 20 18  Temp: 98.3 F (36.8 C) 98.8 F (37.1 C)  SpO2: 98% 100%    Intake/Output Summary (Last  24 hours) at 03/14/2021 1340 Last data filed at 03/14/2021 0700 Gross per 24 hour  Intake 4087.29 ml  Output 1000 ml  Net 3087.29 ml    Filed Weights   03/08/21 2041  Weight: 85.5 kg   Body mass index is 36.81 kg/m.  Exam:  General: Awake, anxious, fatigued HEENT: Normocephalic and atraumatic, mucous membranes slightly dry, noted oral thrush on her tongue-improved from previous day Cardiovascular: Regular rate and rhythm, S1-S2 Respiratory: Poor inspiratory effort, decreased breath sounds bibasilar Abdomen: Soft,?  Nontender, nondistended, hypoactive bowel sounds Musculoskeletal: No clubbing or cyanosis or edema.  Right wrist tender to flexion Skin: No skin breaks, tears or lesions Psychiatry: [place] Neurology: No focal deficits  Data Reviewed: Labs reviewed.  Noted resolution of hypernatremia.  Improving white count.  Creatinine slightly increased from previous day  Disposition:  Status is: Inpatient  Remains inpatient appropriate because: Continued work-up and treatment for infection    Family Communication: Left message for daughter DVT Prophylaxis: heparin injection 5,000 Units Start: 03/08/21 2200    Author: Annita Brod ,MD 03/14/2021 1:40 PM  To reach On-call, see care teams to locate the attending and reach out via [email or web address]. Between 7PM-7AM, please contact night-coverage If you still have difficulty reaching the attending provider, please page the [facility] (Director on Call) for [facility] on amion for  assistance.

## 2021-03-14 NOTE — Anesthesia Postprocedure Evaluation (Signed)
Anesthesia Post Note  Patient: [name] [name]  Procedure(s) Performed: MRI THORASIC AND LUMBAR WITH AND WITHOUT CONTRAST WITH ANESTHESIA     Patient location during evaluation: PACU Anesthesia Type: General Level of consciousness: sedated and patient cooperative Pain management: pain level controlled Vital Signs Assessment: post-procedure vital signs reviewed and stable Respiratory status: spontaneous breathing Cardiovascular status: stable Anesthetic complications: no   No notable events documented.  Last Vitals:  Vitals:   03/14/21 1824 03/14/21 1836  BP: 107/61 (!) 135/51  Pulse: 92 96  Resp: (!) 31 (!) 22  Temp:  36.8 C  SpO2: 95% 92%    Last Pain:  Vitals:   03/14/21 1836  TempSrc:   PainSc: 0-No pain                 Nolon Nations

## 2021-03-14 NOTE — Progress Notes (Signed)
PHARMACY NOTE:  ANTIMICROBIAL RENAL DOSAGE ADJUSTMENT  Current antimicrobial regimen includes a mismatch between antimicrobial dosage and estimated renal function.  As per policy approved by the Pharmacy & Therapeutics and Medical Executive Committees, the antimicrobial dosage will be adjusted accordingly.  Current antimicrobial dosage:  Cefazolin 2 g IV q8h  Indication: MSSA Bacteremia  Renal Function:  Estimated Creatinine Clearance: 28.3 mL/min (A) (by C-G formula based on SCr of 1.67 mg/dL (H)). []      On intermittent HD, scheduled: []      On CRRT    Antimicrobial dosage has been changed to:  Cefazolin 2 g IV q12h  Lenis Noon, PharmD 03/14/21 12:24 PM

## 2021-03-14 NOTE — Plan of Care (Signed)
°  Problem: Education: Goal: Knowledge of General Education information will improve Description: Including pain rating scale, medication(s)/side effects and non-pharmacologic comfort measures Outcome: Not Progressing   Problem: Health Behavior/Discharge Planning: Goal: Ability to manage health-related needs will improve Outcome: Progressing   Problem: Clinical Measurements: Goal: Ability to maintain clinical measurements within normal limits will improve Outcome: Progressing Goal: Will remain free from infection Outcome: Progressing   Problem: Activity: Goal: Risk for activity intolerance will decrease Outcome: Progressing   Problem: Nutrition: Goal: Adequate nutrition will be maintained Outcome: Progressing   Problem: Coping: Goal: Level of anxiety will decrease Outcome: Not Progressing   Problem: Elimination: Goal: Will not experience complications related to bowel motility Outcome: Progressing Goal: Will not experience complications related to urinary retention Outcome: Progressing   Problem: Pain Managment: Goal: General experience of comfort will improve Outcome: Progressing   Problem: Safety: Goal: Ability to remain free from injury will improve Outcome: Progressing   Problem: Skin Integrity: Goal: Risk for impaired skin integrity will decrease Outcome: Progressing   Problem: Fluid Volume: Goal: Hemodynamic stability will improve Outcome: Progressing   Problem: Clinical Measurements: Goal: Diagnostic test results will improve Outcome: Progressing Goal: Signs and symptoms of infection will decrease Outcome: Progressing   Problem: Respiratory: Goal: Ability to maintain adequate ventilation will improve Outcome: Progressing

## 2021-03-14 NOTE — Plan of Care (Signed)
?  Problem: Nutrition: ?Goal: Adequate nutrition will be maintained ?Outcome: Progressing ?  ?Problem: Coping: ?Goal: Level of anxiety will decrease ?Outcome: Progressing ?  ?Problem: Elimination: ?Goal: Will not experience complications related to urinary retention ?Outcome: Progressing ?  ?

## 2021-03-14 NOTE — Transfer of Care (Signed)
Immediate Anesthesia Transfer of Care Note  Patient: [name] [name]  Procedure(s) Performed: MRI THORASIC AND LUMBAR WITH AND WITHOUT CONTRAST WITH ANESTHESIA  Patient Location: PACU  Anesthesia Type:General  Level of Consciousness: awake, alert  and oriented  Airway & Oxygen Therapy: Patient Spontanous Breathing and Patient connected to face mask oxygen  Post-op Assessment: Report given to RN and Post -op Vital signs reviewed and stable  Post vital signs: Reviewed and stable  Last Vitals:  Vitals Value Taken Time  BP 95/42 03/14/21 1821  Temp    Pulse 97 03/14/21 1821  Resp 32 03/14/21 1821  SpO2 92 % 03/14/21 1821  Vitals shown include unvalidated device data.  Last Pain:  Vitals:   03/14/21 1410  TempSrc: Oral  PainSc: 5       Patients Stated Pain Goal: 2 (03/00/92 3300)  Complications: No notable events documented.

## 2021-03-14 NOTE — Anesthesia Procedure Notes (Signed)
Procedure Name: Intubation Date/Time: 03/14/2021 4:29 PM Performed by: Trinna Post., CRNA Pre-anesthesia Checklist: Patient identified, Emergency Drugs available, Suction available, Patient being monitored and Timeout performed Patient Re-evaluated:Patient Re-evaluated prior to induction Oxygen Delivery Method: Circle system utilized Preoxygenation: Pre-oxygenation with 100% oxygen Induction Type: IV induction Ventilation: Mask ventilation without difficulty Laryngoscope Size: Glidescope and 3 Grade View: Grade I Tube type: Oral Tube size: 7.0 mm Number of attempts: 1 Airway Equipment and Method: Rigid stylet and Video-laryngoscopy Placement Confirmation: ETT inserted through vocal cords under direct vision, positive ETCO2 and breath sounds checked- equal and bilateral Secured at: 22 cm Tube secured with: Tape Dental Injury: Teeth and Oropharynx as per pre-operative assessment

## 2021-03-15 ENCOUNTER — Ambulatory Visit (HOSPITAL_COMMUNITY): Payer: Medicare Other

## 2021-03-15 DIAGNOSIS — D649 Anemia, unspecified: Secondary | ICD-10-CM

## 2021-03-15 DIAGNOSIS — R7401 Elevation of levels of liver transaminase levels: Secondary | ICD-10-CM

## 2021-03-15 DIAGNOSIS — G9341 Metabolic encephalopathy: Secondary | ICD-10-CM | POA: Diagnosis not present

## 2021-03-15 DIAGNOSIS — N179 Acute kidney failure, unspecified: Secondary | ICD-10-CM | POA: Diagnosis not present

## 2021-03-15 DIAGNOSIS — R7881 Bacteremia: Secondary | ICD-10-CM | POA: Diagnosis not present

## 2021-03-15 DIAGNOSIS — A4101 Sepsis due to Methicillin susceptible Staphylococcus aureus: Secondary | ICD-10-CM | POA: Diagnosis not present

## 2021-03-15 LAB — CULTURE, BLOOD (ROUTINE X 2)
Culture: NO GROWTH
Culture: NO GROWTH
Special Requests: ADEQUATE

## 2021-03-15 LAB — GLUCOSE, CAPILLARY
Glucose-Capillary: 137 mg/dL — ABNORMAL HIGH (ref 70–99)
Glucose-Capillary: 217 mg/dL — ABNORMAL HIGH (ref 70–99)
Glucose-Capillary: 141 mg/dL — ABNORMAL HIGH (ref 70–99)
Glucose-Capillary: 212 mg/dL — ABNORMAL HIGH (ref 70–99)

## 2021-03-15 LAB — CBC
HCT: 28.5 % — ABNORMAL LOW (ref 36.0–46.0)
Hemoglobin: 9 g/dL — ABNORMAL LOW (ref 12.0–15.0)
MCH: 31.5 pg (ref 26.0–34.0)
MCHC: 31.6 g/dL (ref 30.0–36.0)
MCV: 99.7 fL (ref 80.0–100.0)
Platelets: 239 10*3/uL (ref 150–400)
RBC: 2.86 MIL/uL — ABNORMAL LOW (ref 3.87–5.11)
RDW: 14.5 % (ref 11.5–15.5)
WBC: 14.5 10*3/uL — ABNORMAL HIGH (ref 4.0–10.5)
nRBC: 0 % (ref 0.0–0.2)

## 2021-03-15 LAB — BASIC METABOLIC PANEL
Anion gap: 8 (ref 5–15)
BUN: 50 mg/dL — ABNORMAL HIGH (ref 8–23)
CO2: 20 mmol/L — ABNORMAL LOW (ref 22–32)
Calcium: 8.4 mg/dL — ABNORMAL LOW (ref 8.9–10.3)
Chloride: 116 mmol/L — ABNORMAL HIGH (ref 98–111)
Creatinine, Ser: 1.45 mg/dL — ABNORMAL HIGH (ref 0.44–1.00)
GFR, Estimated: 38 mL/min — ABNORMAL LOW (ref >60)
Glucose, Bld: 170 mg/dL — ABNORMAL HIGH (ref 70–99)
Potassium: 4.3 mmol/L (ref 3.5–5.1)
Sodium: 144 mmol/L (ref 135–145)

## 2021-03-15 MED ORDER — CEFAZOLIN SODIUM-DEXTROSE 2-4 GM/100ML-% IV SOLN
2.0000 g | Freq: Three times a day (TID) | INTRAVENOUS | Status: DC
  Administered 2021-03-15 – 2021-04-03 (×57): 2 g via INTRAVENOUS
  Filled 2021-03-15 (×58): qty 100

## 2021-03-15 NOTE — Progress Notes (Signed)
[facility] Progress Note  Patient: [name] [name]    UUV:[id number]  DOA: 03/08/2021    Date of Service: the patient was seen and examined on 03/15/2021  Brief hospital course: 76 year old female with past medical history of hypothyroidism, diastolic CHF, stage IIIb chronic kidney disease and mood disorder presented to the emergency room on 03/08/2021 after multiple falls at home.  Although poor historian, patient is noted to have slow decline.  In the emergency room, found to be hypoxic and septic from pneumonia.  Following admission, blood cultures positive for MSSA.  Work-up ongoing to ensure source.  ID plans for TEE next week.  Although patient less somnolent, remains confused.  Due to poor p.o. intake, patient developed hypernatremia which was treated with D5W.  Patient is status post MRI of thoracic/lumbar spine with general anesthesia on 1/28.  Assessment and Plan: * Sepsis ([place])- (present on admission) - Febrile, tachycardia, tachypnea, leukocytosis; presumed lung source given opacities on CT chest with possible aspiration however now having worsening left shoulder pain (s/p L deltoid kenalog injection on 03/06/21) and ongoing low back pain.  White count has been steadily improving for several days. - Given vancomycin, cefepime, Flagyl in the ER - No significant risk factors for MDRO, then changed to CTX/azithro after blood cultures grew MSSA, will change her to Unasyn.  No further fevers times greater than 24 hours.  ID following.  Plan for TEE next week.  Status post MRI of thoracic/lumbar spine 1/28, results are pending  Bacteremia- (present on admission) - 4/4 bottles positive on admission (1/22) with MSSA. Differential for etiology at this time includes recent outpt steroid injection on 1/20 to Left deltoid vs vertebral infection (worsening lower back pain) vs spontaneous bacteremia vs translocation from pulmonary source - abx changed to Unasyn which should cover presumed sources  for now - obtain MRI left shoulder. ID has ordered MRI T/L spine.  Unfortunately, patient too agitated to stay still, so trying to reorder with general anesthesia.   - TTE on 1/23: some poor images but no obvious vegetations (EF 60-65%, mild LVH).  TEE next week Blood cultures from 1/24 1/25 with no growth to date  Acute respiratory failure with hypoxia ([place])- (present on admission) - Presumed due to underlying pneumonia/aspiration -Patient doing well.  We will attempt to wean down oxygen.  Last check at 100% on 3 L. - See sepsis and bacteremia work-up otherwise  Acute renal failure superimposed on stage 3b chronic kidney disease ([place])- (present on admission) - patient has history of CKD3b. Baseline creat ~ 1.3, eGFR 38 -Creatinine on admission at 2.47 and with fluids has been slowly improving.  Labs from today pending.  Creatinine on 1/28 1.67.  Hypernatremia-resolved as of 03/14/2021 Due to poor p.o. intake from altered mentation.  Have changed IV fluids to half-normal saline.  Started on D5W after half-normal saline not improving sodium.  Peaking at 157, sodium has since improved with D5W.  Acute metabolic encephalopathy Patient more awake now.  Some of this may have been due to uremia and hypernatremia.  Significantly improved, patient today is alert and oriented K7-4  Chronic diastolic heart failure ([place])- (present on admission) - No S/S exacerbation on exam.  Patient is volume depleted clinically Because of need for D5W, has been getting fluids at 125 cc an hour.  BNP elevated given additional fluids.  Waiting for 1/29 labs and then can stop fluids and then start Lasix. -Last echo reviewed from 11/06/2020: Moderate MR, impaired relaxation, EF 65% -  TTE repeated on 1/23, EF 60-65%, mild LVH; trivial MR on read; normal diastology   Hypothyroidism- (present on admission) - TSH, 0.429 - continue Synthroid  DMII (diabetes mellitus, type 2) (HCC) - History of prediabetes, no A1c on  file - A1c on admission 6.6% - Treat with sliding scale for now and diet control  CBGs have been trending higher in the low 200s due to being on D5W  Elevated LFTs- (present on admission) Suspect secondary to shock liver from sepsis.  Transaminases slowly improving  Hyponatremia-resolved as of 03/09/2021, (present on admission) - presumed hypovolemic hyponatremia.  Resolved with IV fluids  Morbid obesity ([place])- (present on admission) Meets criteria for BMI greater than 35+ comorbidities of heart failure and diabetes and hypertension  Thrush, oral Started on fluconazole.  Slowly improving.  Fall at home, initial encounter - Patient found on floor by EMS on their arrival.  Patient states floor is carpeted although she cannot provide any further collateral information regarding her fall or length of time on the floor - Given renal failure on admission which is still likely prerenal, check CK - Multiple imaging studies on admission including right wrist x-ray, CT head, CT cervical spine, and CT chest: All with no acute abnormalities but does have 4 mm anterolisthesis C3 on C4 - continue pain control - will consult PT once more stable   Normocytic anemia- (present on admission) - No complaints or signs of bleeding.  No recent lab work other than July 2021 (12.3 g/dL at that time).  Hemoglobin stable since admission.    Body mass index is 36.81 kg/m.        Consultants: Infectious disease  Procedures: Echocardiogram-no vegetations noted Plan for TEE next week if patient amenable MRI under general anesthesia planned 1/28  Antimicrobials: Continue IV Ancef  Code Status: Full code   Subjective: Patient much more awake and alert.  Complains of some generalized pain which she had following moving around on stretcher for MRI.  Have good awareness of what happened yesterday.  Complains of some dry mouth/thrush on her tongue  Objective: Noted increased tachypnea, elevations in  blood pressure Vitals:   03/15/21 0600 03/15/21 1255  BP: (!) 142/58 (!) 131/48  Pulse:  96  Resp:  16  Temp:  98.2 F (36.8 C)  SpO2:  100%    Intake/Output Summary (Last 24 hours) at 03/15/2021 1616 Last data filed at 03/15/2021 1347 Gross per 24 hour  Intake 2324.91 ml  Output 950 ml  Net 1374.91 ml    Filed Weights   03/08/21 2041  Weight: 85.5 kg   Body mass index is 36.81 kg/m.  Exam:  General: Awake, no acute distress, alert and oriented x2-3 HEENT: Normocephalic and atraumatic, mucous membranes slightly dry, noted oral thrush on her tongue-continues to improve Cardiovascular: Regular rate and rhythm, S1-S2 Respiratory: Good inspiratory effort, decreased breath sounds throughout in part due to body habitus Abdomen: Soft,?  Mild nonspecific tenderness, nondistended, hypoactive bowel sounds Musculoskeletal: No clubbing or cyanosis or edema.  Right wrist minimal tenderness to flexion Skin: No skin breaks, tears or lesions Psychiatry: Appropriate, no evidence of psychoses Neurology: No focal deficits  Data Reviewed: Labs reviewed.  No labs from today due to lab error.  Labs to be redrawn and are pending.  Disposition:  Status is: Inpatient  Remains inpatient appropriate because: Continued work-up and treatment for infection    Family Communication: Left message for daughter DVT Prophylaxis: heparin injection 5,000 Units Start: 03/08/21 2200  Author: Annita Brod ,MD 03/15/2021 4:16 PM  To reach On-call, see care teams to locate the attending and reach out via [email or web address]. Between 7PM-7AM, please contact night-coverage If you still have difficulty reaching the attending provider, please page the [facility] (Director on Call) for [facility] on amion for assistance.

## 2021-03-15 NOTE — Plan of Care (Signed)
  Problem: Clinical Measurements: Goal: Ability to maintain clinical measurements within normal limits will improve Outcome: Progressing   Problem: Coping: Goal: Level of anxiety will decrease Outcome: Progressing   Problem: Safety: Goal: Ability to remain free from injury will improve Outcome: Progressing   

## 2021-03-15 NOTE — Plan of Care (Signed)
  Problem: Education: Goal: Knowledge of General Education information will improve Description: Including pain rating scale, medication(s)/side effects and non-pharmacologic comfort measures Outcome: Progressing   Problem: Nutrition: Goal: Adequate nutrition will be maintained Outcome: Progressing   Problem: Coping: Goal: Level of anxiety will decrease Outcome: Progressing   Problem: Elimination: Goal: Will not experience complications related to urinary retention Outcome: Progressing   

## 2021-03-15 NOTE — Progress Notes (Signed)
PHARMACY NOTE:  ANTIMICROBIAL RENAL DOSAGE ADJUSTMENT  Current antimicrobial regimen includes a mismatch between antimicrobial dosage and estimated renal function.  As per policy approved by the Pharmacy & Therapeutics and Medical Executive Committees, the antimicrobial dosage will be adjusted accordingly.  Current antimicrobial dosage:  Cefazolin 2 g IV q12h  Indication: MSSA Bacteremia  Renal Function:  Estimated Creatinine Clearance: 32.5 mL/min (A) (by C-G formula based on SCr of 1.45 mg/dL (H)). []      On intermittent HD, scheduled: []      On CRRT    Antimicrobial dosage has been changed to:  Cefazolin 2 g IV q8h  Lenis Noon, PharmD 03/15/21 6:53 PM

## 2021-03-16 ENCOUNTER — Encounter (HOSPITAL_COMMUNITY): Payer: Self-pay | Admitting: Radiology

## 2021-03-16 ENCOUNTER — Encounter (HOSPITAL_COMMUNITY)
Admission: EM | Disposition: A | Discharge status: Skilled Nursing Facility | Payer: Self-pay | Source: Home / Self Care | Attending: Internal Medicine

## 2021-03-16 DIAGNOSIS — R7881 Bacteremia: Secondary | ICD-10-CM | POA: Diagnosis not present

## 2021-03-16 DIAGNOSIS — G9341 Metabolic encephalopathy: Secondary | ICD-10-CM | POA: Diagnosis not present

## 2021-03-16 DIAGNOSIS — B370 Candidal stomatitis: Secondary | ICD-10-CM

## 2021-03-16 DIAGNOSIS — N179 Acute kidney failure, unspecified: Secondary | ICD-10-CM | POA: Diagnosis not present

## 2021-03-16 DIAGNOSIS — A4101 Sepsis due to Methicillin susceptible Staphylococcus aureus: Secondary | ICD-10-CM | POA: Diagnosis not present

## 2021-03-16 LAB — CULTURE, BLOOD (ROUTINE X 2)
Culture: NO GROWTH
Special Requests: ADEQUATE

## 2021-03-16 LAB — BASIC METABOLIC PANEL
Anion gap: 8 (ref 5–15)
BUN: 50 mg/dL — ABNORMAL HIGH (ref 8–23)
CO2: 21 mmol/L — ABNORMAL LOW (ref 22–32)
Calcium: 8.7 mg/dL — ABNORMAL LOW (ref 8.9–10.3)
Chloride: 113 mmol/L — ABNORMAL HIGH (ref 98–111)
Creatinine, Ser: 1.49 mg/dL — ABNORMAL HIGH (ref 0.44–1.00)
GFR, Estimated: 36 mL/min — ABNORMAL LOW (ref >60)
Glucose, Bld: 146 mg/dL — ABNORMAL HIGH (ref 70–99)
Potassium: 5.4 mmol/L — ABNORMAL HIGH (ref 3.5–5.1)
Sodium: 142 mmol/L (ref 135–145)

## 2021-03-16 LAB — CBC
HCT: 32.9 % — ABNORMAL LOW (ref 36.0–46.0)
Hemoglobin: 10.5 g/dL — ABNORMAL LOW (ref 12.0–15.0)
MCH: 31.3 pg (ref 26.0–34.0)
MCHC: 31.9 g/dL (ref 30.0–36.0)
MCV: 98.2 fL (ref 80.0–100.0)
Platelets: 297 10*3/uL (ref 150–400)
RBC: 3.35 MIL/uL — ABNORMAL LOW (ref 3.87–5.11)
RDW: 14.5 % (ref 11.5–15.5)
WBC: 17.2 10*3/uL — ABNORMAL HIGH (ref 4.0–10.5)
nRBC: 0 % (ref 0.0–0.2)

## 2021-03-16 LAB — GLUCOSE, CAPILLARY
Glucose-Capillary: 187 mg/dL — ABNORMAL HIGH (ref 70–99)
Glucose-Capillary: 204 mg/dL — ABNORMAL HIGH (ref 70–99)
Glucose-Capillary: 118 mg/dL — ABNORMAL HIGH (ref 70–99)
Glucose-Capillary: 159 mg/dL — ABNORMAL HIGH (ref 70–99)

## 2021-03-16 LAB — PROCALCITONIN: Procalcitonin: 1.24 ng/mL

## 2021-03-16 SURGERY — ECHOCARDIOGRAM, TRANSESOPHAGEAL
Anesthesia: Monitor Anesthesia Care

## 2021-03-16 MED ORDER — SODIUM CHLORIDE 0.45 % IV SOLN: INTRAVENOUS | Status: DC

## 2021-03-16 NOTE — Plan of Care (Signed)
  Problem: Coping: Goal: Level of anxiety will decrease Outcome: Progressing   Problem: Pain Managment: Goal: General experience of comfort will improve Outcome: Progressing   Problem: Safety: Goal: Ability to remain free from injury will improve Outcome: Progressing   

## 2021-03-16 NOTE — Progress Notes (Signed)
Speech Language Pathology Treatment: Dysphagia  Patient Details Name: [name] [name] MRN: [id number] DOB: 1946/01/29 Today's Date: 03/16/2021 Time: 1250-1305 SLP Time Calculation (min) (ACUTE ONLY): 15 min  Assessment / Plan / Recommendation Clinical Impression  Patient seen by SLP at lunch time to assess her toleration of recently upgraded PO consistencies (from full liquids to dys 3 solids). Patient tolerated dys 3 solids with SLP feeding her first bite and her feeding herself second bite. Prolonged mastication but otherwise no significant difficulty and no overt s/s aspiration or penetration. She consumed thin liquids via straw sips without any observed difficulty. Patient was distracted by things on tray and commenting at the large amount of food on her plate. After a couple bites of food patient declined any further PO's. SLP discussed briefly with patient's RN and informed her that patient will require some assistance and encouragement for adequate PO intake. SLP to continue to follow patient for swallow function goals.   HPI HPI: Pt is a 76 yo female adm 3 days ago with weakness after being found down by her son.  Pt with PMH + for anxiety, mood disorder - weaned from litihium in 2013, morbidly obese, colon cancer, recurrent falls, recent back pain, decreased po intake, breast cancer.  CT chest showed partial consolidations in the lower lobes could reflect atelectasis or mild pneumonia.  Per PT, pt was given water during her therapy session resulting in pt overtly coughing with intake.  Swallow evaluation was ordered.  She is currently on 3 liters oxygen via nasal cannula.  Swallow eval completed on 03/11/2021 and she was made NPO x ice chips.  Pt was placed on a clear liquid diet following dysphagia treatment session.  Management of dysphagia during hospital course has been ongoing.      SLP Plan  Continue with current plan of care      Recommendations for follow up therapy are one  component of a multi-disciplinary discharge planning process, led by the attending physician.  Recommendations may be updated based on patient status, additional functional criteria and insurance authorization.    Recommendations  Diet recommendations: Dysphagia 3 (mechanical soft);Thin liquid Liquids provided via: Cup;Straw Medication Administration: Whole meds with puree Supervision: Intermittent supervision to cue for compensatory strategies Compensations: Other (Comment);Slow rate;Small sips/bites Postural Changes and/or Swallow Maneuvers: Seated upright 90 degrees;Upright 30-60 min after meal                Oral Care Recommendations: Oral care BID;Staff/trained caregiver to provide oral care Follow Up Recommendations: Skilled nursing-short term rehab (<3 hours/day) Assistance recommended at discharge: Frequent or constant Supervision/Assistance SLP Visit Diagnosis: Dysphagia, oropharyngeal phase (R13.12) Plan: Continue with current plan of care           Sonia Baller, MA, CCC-SLP Speech Therapy

## 2021-03-16 NOTE — Progress Notes (Signed)
Physical Therapy Treatment Patient Details Name: [name] [name] MRN: [id number] DOB: January 19, 1946 Today's Date: 03/16/2021   History of Present Illness 76 yo female presents to ED on 1/22 with multiple falls, min confusion, hypoxia; workup for sepsis, ARF. CT chest showed bilateral pleural effusions, bilateral lower lobe bronchial wall thickening with consolidations concerning for possible pneumonia, possible 7 mm left apical lung nodule.  recent outpatient Kenalog injection in the left deltoid on 03/06/2021. PMH anxiety, dCHF, HLD, hypothyroidism, IBS, mood disorder, prediabetes, CKD 3b.    PT Comments    Progressing slowly with mobility. Continues to require +2 assist for mobility. Mod encouragement required. Pt still requires cues to stay on task. Continue to recommend ST SNF for rehab.    Recommendations for follow up therapy are one component of a multi-disciplinary discharge planning process, led by the attending physician.  Recommendations may be updated based on patient status, additional functional criteria and insurance authorization.  Follow Up Recommendations  Skilled nursing-short term rehab (<3 hours/day)     Assistance Recommended at Discharge Frequent or constant Supervision/Assistance  Patient can return home with the following Two people to help with walking and/or transfers;Two people to help with bathing/dressing/bathroom;Assistance with feeding;Assistance with cooking/housework;Assist for transportation;Help with stairs or ramp for entrance   Equipment Recommendations       Recommendations for Other Services       Precautions / Restrictions Precautions Precautions: Fall Restrictions Weight Bearing Restrictions: No     Mobility  Bed Mobility Overal bed mobility: Needs Assistance Bed Mobility: Supine to Sit     Supine to sit: Mod assist, +2 for physical assistance, +2 for safety/equipment, HOB elevated     General bed mobility comments: Mod verbal cues to  keep pt on task and for completion of mobility task. Assist for trunk and bil LEs. Utilized bedpad to aid with scooting, positioning. Increased time.    Transfers Overall transfer level: Needs assistance Equipment used: Rolling walker (2 wheels) Transfers: Sit to/from Stand, Bed to chair/wheelchair/BSC Sit to Stand: Mod assist, +2 physical assistance, +2 safety/equipment           General transfer comment: Stood x 3 (once from bed with RW, once from bed with STEDY, once from [facility]). Assist to power up, stabilize, control descent. Multimodal cueing required. Increased time. Used STEDY to transfer pt to recliner. 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)       Balance Overall balance assessment: Needs assistance Sitting-balance support: Bilateral upper extremity supported, Feet supported Sitting balance-Leahy Scale: Fair     Standing balance support: Bilateral upper extremity supported Standing balance-Leahy Scale: Poor                              Cognition Arousal/Alertness: Awake/alert Behavior During Therapy: WFL for tasks assessed/performed Overall Cognitive Status: No family/caregiver present to determine baseline cognitive functioning Area of Impairment: Problem solving, Safety/judgement, Following commands                       Following Commands: Follows one step commands inconsistently Safety/Judgement: Decreased awareness of deficits, Decreased awareness of safety   Problem Solving: Slow processing, Requires verbal cues, Requires tactile  cues, Difficulty sequencing General Comments: Patient is A/O x3 however very tangential needing frequent cues to redirect back to task        Exercises      General Comments        Pertinent Vitals/Pain Pain Assessment Pain  Assessment: Faces Faces Pain Scale: Hurts even more Pain Location: neck, back Pain Descriptors / Indicators: Discomfort, Sore, Aching Pain Intervention(s): Limited activity within patient's tolerance, Monitored during session, Repositioned    Home Living                          Prior Function            PT Goals (current goals can now be found in the care plan section) Progress towards PT goals: Progressing toward goals    Frequency    Min 2X/week      PT Plan Current plan remains appropriate    Co-evaluation              AM-PAC PT "6 Clicks" Mobility   Outcome Measure  Help needed turning from your back to your side while in a flat bed without using bedrails?: A Lot Help needed moving from lying on your back to sitting on the side of a flat bed without using bedrails?: A Lot Help needed moving to and from a bed to a chair (including a wheelchair)?: Total Help needed standing up from a chair using your arms (e.g., wheelchair or bedside chair)?: Total Help needed to walk in hospital room?: Total Help needed climbing 3-5 steps with a railing? : Total 6 Click Score: 8    End of Session Equipment Utilized During Treatment: Oxygen Activity Tolerance: Patient limited by fatigue Patient left: in chair;with call bell/phone within reach;with chair alarm set   PT Visit Diagnosis: Other abnormalities of gait and mobility (R26.89);Muscle weakness (generalized) (M62.81)     Time: 4970-2637 PT Time Calculation (min) (ACUTE ONLY): 32 min  Charges:  $Therapeutic Activity: 23-37 mins                         Doreatha Massed, PT Acute Rehabilitation  Office: [phone] Pager: [phone]

## 2021-03-16 NOTE — Plan of Care (Signed)
  Problem: Activity: Goal: Risk for activity intolerance will decrease Outcome: Progressing   

## 2021-03-16 NOTE — TOC Progression Note (Signed)
Transition of Care [facility]) - Progression Note    Patient Details  Name: Neftaly Inzunza MRN: [id number] Date of Birth: 1945-10-26  [facility] CM/SW Contact  Leeroy Cha, RN Phone Number: 03/16/2021, 9:52 AM  Clinical Narrative:    FL2 SENT OUT TO AREA SNF'S    Expected Discharge Plan: Home/Self Care Barriers to Discharge: Continued Medical Work up  Expected Discharge Plan and Services Expected Discharge Plan: Home/Self Care   Discharge Planning Services: CM Consult   Living arrangements for the past 2 months: Single Family Home                                       Social Determinants of Health (SDOH) Interventions    Readmission Risk Interventions No flowsheet data found.

## 2021-03-16 NOTE — Progress Notes (Signed)
Speech Language Pathology Treatment: Dysphagia  Patient Details Name: [name] [name] MRN: [id number] DOB: April 13, 1945 Today's Date: 03/16/2021 Time: 1000-1020 SLP Time Calculation (min) (ACUTE ONLY): 20 min  Assessment / Plan / Recommendation Clinical Impression  Patient seen by SLP to address dysphagia goals. She had just gotten into recliner with assitance from PT and rehab tech. She was awake, alert and although she was somewhat fixated on neck, back pain and asking SLP about recent medical testing, her mentation(as compared to recent SLP notes) appears improved. Patient consumed successive straw sips of thin liquids (water and gatorade) and did not exhibit any immediate s/s aspiration or penetration. She did exhibit intermittent throat clearing and although suspect this is related to pharyngeal secetions/phlegm, cannot r/o penetration of PO liquids consumed. Patient's voice remained clear and strong. SLP is recommending to upgrade patient's diet textures from full liquids to Dys 3 solids. Although she could be intermittent for supervision level related to swallow safety, she may need full assistance with self-feeding. SLP to continue to follow for toleration of PO's.    HPI HPI: Pt is a 76 yo female adm 3 days ago with weakness after being found down by her son.  Pt with PMH + for anxiety, mood disorder - weaned from litihium in 2013, morbidly obese, colon cancer, recurrent falls, recent back pain, decreased po intake, breast cancer.  CT chest showed partial consolidations in the lower lobes could reflect atelectasis or mild pneumonia.  Per PT, pt was given water during her therapy session resulting in pt overtly coughing with intake.  Swallow evaluation was ordered.  She is currently on 3 liters oxygen via nasal cannula.  Swallow eval completed on 03/11/2021 and she was made NPO x ice chips.  Pt was placed on a clear liquid diet following dysphagia treatment session.  Management of dysphagia during  hospital coarse has been ongoing.      SLP Plan  Continue with current plan of care      Recommendations for follow up therapy are one component of a multi-disciplinary discharge planning process, led by the attending physician.  Recommendations may be updated based on patient status, additional functional criteria and insurance authorization.    Recommendations  Diet recommendations: Dysphagia 3 (mechanical soft);Thin liquid Liquids provided via: Cup;Straw Medication Administration: Whole meds with puree Supervision: Intermittent supervision to cue for compensatory strategies Compensations: Other (Comment);Slow rate;Small sips/bites Postural Changes and/or Swallow Maneuvers: Seated upright 90 degrees;Upright 30-60 min after meal                Oral Care Recommendations: Oral care BID;Staff/trained caregiver to provide oral care Follow Up Recommendations: Skilled nursing-short term rehab (<3 hours/day) Assistance recommended at discharge: Frequent or constant Supervision/Assistance SLP Visit Diagnosis: Dysphagia, oropharyngeal phase (R13.12) Plan: Continue with current plan of care           Sonia Baller, MA, CCC-SLP Speech Therapy

## 2021-03-16 NOTE — Progress Notes (Signed)
[place] for Infectious Disease  Date of Admission:  03/08/2021     Abx: 1/24-c cefazolin 1/26-c fluconazole  1/22-24 vanc/cefepime/flagyl --> amp/sulb  ASSESSMENT: Mssa bacteremia community acquired Severe sepsis thrush Back pain Metabolic encephalopathy resolved AKI on Ckd3  76 yo female admitted 1/23 after slidding off her bed unable to get up, found to have severe sepsis/mssa bacteremia and metabolic encephalopathy  4/13 and 1/25 repeat bcx negative 1/22 bcx 2 of 2 set mssa (S tetra, bactrim)  Mentating much better Initial dysphagia in setting metabolic encephalopathy cleared by speech for oral intake  Initial left shoulder pain no longer present -- exam 1/30 without shoulder joint tenderness or decreased passive rom  Nonspecific mid/upper back pain awaiting read on thoracic and lumbar mri from 1/28  Tte no obvious vegetation; tee refused last week as dysphagic. Pending mri read will see if tee needed  On thrush treatment -- resolving; ?hospital onset. Hiv negative. Not on any steroid inhaler previously and doesn't appear to be severely malnutritious and no recent abx outside of what is being given here  PLAN: F/u mri read Continue cefazolin for now [facility] decision pending mri read -- bcx had cleared Finish 10 day thrush treatment on 2/05 -- oral fluconazole should be fine Discussed with primary team   Principal Problem:   Sepsis ([place]) Active Problems:   Chronic diastolic heart failure ([place])   Acute renal failure superimposed on stage 3b chronic kidney disease ([place])   Elevated LFTs   DMII (diabetes mellitus, type 2) (HCC)   Normocytic anemia   Fall at home, initial encounter   Acute respiratory failure with hypoxia ([place])   Hypothyroidism   Bacteremia   Morbid obesity ([place])   Acute metabolic encephalopathy   Thrush, oral   No Known Allergies  Scheduled Meds:  carbamazepine  200 mg Oral QHS   chlorhexidine  15 mL Mouth Rinse BID    heparin  5,000 Units Subcutaneous Q8H   insulin aspart  0-5 Units Subcutaneous QHS   insulin aspart  0-9 Units Subcutaneous TID WC   levothyroxine  88 mcg Oral Q0600   mouth rinse  15 mL Mouth Rinse q12n4p   sodium chloride flush  3 mL Intravenous Q12H   Continuous Infusions:   ceFAZolin (ANCEF) IV 2 g (03/16/21 2440)   dextrose 50 mL/hr at 03/15/21 1812   fluconazole (DIFLUCAN) IV 200 mg (03/15/21 1243)   PRN Meds:.acetaminophen **OR** acetaminophen, bisacodyl, hydrALAZINE, labetalol, naphazoline-glycerin, ondansetron (ZOFRAN) IV   SUBJECTIVE: Complains of nonspecific back discomfort No shoulder pain Remembered what happened and why she is here Cleared for oral intake No f/c Wbc moderately elevated stable Mri from 1/28 pending read No n/v/diarrhea/rash   Review of Systems: ROS All other ROS was negative, except mentioned above     OBJECTIVE: Vitals:   03/15/21 0600 03/15/21 1255 03/15/21 2047 03/16/21 0444  BP: (!) 142/58 (!) 131/48 133/73 (!) 158/66  Pulse:  96 93 87  Resp:  16 18 20   Temp:  98.2 F (36.8 C) 98.6 F (37 C) 99.2 F (37.3 C)  TempSrc:   Oral Oral  SpO2:  100% 99% 100%  Weight:       Body mass index is 36.81 kg/m.  Physical Exam  General/constitutional: no distress, pleasant -- fully conversant; sitting in chair HEENT: Normocephalic, PER, Conj Clear, EOMI, Oropharynx clear Neck supple CV: rrr no mrg Lungs: clear to auscultation, normal respiratory effort Abd: Soft, Nontender Ext: no edema  Skin: No Rash Neuro: nonfocal MSK: nontender bilateral shoulder palpation and active/passive rom. No peripheral joint swelling/tenderness/redness  Lab Results Lab Results  Component Value Date   WBC 17.2 (H) 03/16/2021   HGB 10.5 (L) 03/16/2021   HCT 32.9 (L) 03/16/2021   MCV 98.2 03/16/2021   PLT 297 03/16/2021    Lab Results  Component Value Date   CREATININE 1.49 (H) 03/16/2021   BUN 50 (H) 03/16/2021   NA 142 03/16/2021   K 5.4 (H)  03/16/2021   CL 113 (H) 03/16/2021   CO2 21 (L) 03/16/2021    Lab Results  Component Value Date   ALT 14 03/14/2021   AST 44 (H) 03/14/2021   ALKPHOS 76 03/14/2021   BILITOT 0.5 03/14/2021      Microbiology: Recent Results (from the past 240 hour(s))  Blood Culture (routine x 2)     Status: Abnormal   Collection Time: 03/08/21  2:37 PM   Specimen: BLOOD  Result Value Ref Range Status   Specimen Description   Final    BLOOD LEFT ANTECUBITAL Performed at [facility], [place] [address] [place], [place] [address]    Special Requests   Final    BOTTLES DRAWN AEROBIC AND ANAEROBIC Blood Culture results may not be optimal due to an excessive volume of blood received in culture bottles Performed at [place] [address], [place], [place] [address]    Culture  Setup Time   Final    GRAM POSITIVE COCCI IN BOTH AEROBIC AND ANAEROBIC BOTTLES CRITICAL RESULT CALLED TO, READ BACK BY AND VERIFIED WITH: M LILLISTON,PHARMD@[id number]  03/09/21 [place] Performed at [facility], [place] [address] [place], [place] [address]    Culture STAPHYLOCOCCUS AUREUS (A)  Final   Report Status 03/11/2021 FINAL  Final   Organism ID, Bacteria STAPHYLOCOCCUS AUREUS  Final      Susceptibility   Staphylococcus aureus - MIC*    CIPROFLOXACIN <=0.5 SENSITIVE Sensitive     ERYTHROMYCIN <=0.25 SENSITIVE Sensitive     GENTAMICIN <=0.5 SENSITIVE Sensitive     OXACILLIN 0.5 SENSITIVE Sensitive     TETRACYCLINE <=1 SENSITIVE Sensitive     VANCOMYCIN <=0.5 SENSITIVE Sensitive     TRIMETH/SULFA <=10 SENSITIVE Sensitive     CLINDAMYCIN <=0.25 SENSITIVE Sensitive     RIFAMPIN <=0.5 SENSITIVE Sensitive     Inducible Clindamycin NEGATIVE Sensitive     * STAPHYLOCOCCUS AUREUS  Blood Culture ID Panel (Reflexed)     Status: Abnormal   Collection Time: 03/08/21  2:37 PM  Result Value Ref Range Status   Enterococcus faecalis NOT DETECTED NOT DETECTED Final   Enterococcus Faecium  NOT DETECTED NOT DETECTED Final   Listeria monocytogenes NOT DETECTED NOT DETECTED Final   Staphylococcus species DETECTED (A) NOT DETECTED Final    Comment: CRITICAL RESULT CALLED TO, READ BACK BY AND VERIFIED WITH: M LILLISTON,PHARMD@[id number]  03/09/21 [place]    Staphylococcus aureus (BCID) DETECTED (A) NOT DETECTED Final    Comment: CRITICAL RESULT CALLED TO, READ BACK BY AND VERIFIED WITH: M LILLISTON,PHARMD@[id number]  03/09/21 [place]    Staphylococcus epidermidis NOT DETECTED NOT DETECTED Final   Staphylococcus lugdunensis NOT DETECTED NOT DETECTED Final   Streptococcus species NOT DETECTED NOT DETECTED Final   Streptococcus agalactiae NOT DETECTED NOT DETECTED Final   Streptococcus pneumoniae NOT DETECTED NOT DETECTED Final   Streptococcus pyogenes NOT DETECTED NOT DETECTED Final   A.calcoaceticus-baumannii NOT DETECTED NOT DETECTED Final   Bacteroides fragilis NOT DETECTED NOT DETECTED Final  Enterobacterales NOT DETECTED NOT DETECTED Final   Enterobacter cloacae complex NOT DETECTED NOT DETECTED Final   Escherichia coli NOT DETECTED NOT DETECTED Final   Klebsiella aerogenes NOT DETECTED NOT DETECTED Final   Klebsiella oxytoca NOT DETECTED NOT DETECTED Final   Klebsiella pneumoniae NOT DETECTED NOT DETECTED Final   Proteus species NOT DETECTED NOT DETECTED Final   Salmonella species NOT DETECTED NOT DETECTED Final   Serratia marcescens NOT DETECTED NOT DETECTED Final   Haemophilus influenzae NOT DETECTED NOT DETECTED Final   Neisseria meningitidis NOT DETECTED NOT DETECTED Final   Pseudomonas aeruginosa NOT DETECTED NOT DETECTED Final   Stenotrophomonas maltophilia NOT DETECTED NOT DETECTED Final   Candida albicans NOT DETECTED NOT DETECTED Final   Candida auris NOT DETECTED NOT DETECTED Final   Candida glabrata NOT DETECTED NOT DETECTED Final   Candida krusei NOT DETECTED NOT DETECTED Final   Candida parapsilosis NOT DETECTED NOT DETECTED Final   Candida tropicalis NOT DETECTED NOT DETECTED  Final   Cryptococcus neoformans/gattii NOT DETECTED NOT DETECTED Final   Meth resistant mecA/C and MREJ NOT DETECTED NOT DETECTED Final    Comment: Performed at [facility], [place] [address], [place], [place] [address]  Resp Panel by RT-PCR (Flu A&B, Covid) Nasopharyngeal Swab     Status: None   Collection Time: 03/08/21  3:00 PM   Specimen: Nasopharyngeal Swab; Nasopharyngeal(NP) swabs in vial transport medium  Result Value Ref Range Status   SARS Coronavirus 2 by RT PCR NEGATIVE NEGATIVE Final    Comment: (NOTE) SARS-CoV-2 target nucleic acids are NOT DETECTED.  The SARS-CoV-2 RNA is generally detectable in upper respiratory specimens during the acute phase of infection. The lowest concentration of SARS-CoV-2 viral copies this assay can detect is 138 copies/mL. A negative result does not preclude SARS-Cov-2 infection and should not be used as the sole basis for treatment or other patient management decisions. A negative result may occur with  improper specimen collection/handling, submission of specimen other than nasopharyngeal swab, presence of viral mutation(s) within the areas targeted by this assay, and inadequate number of viral copies(<138 copies/mL). A negative result must be combined with clinical observations, patient history, and epidemiological information. The expected result is Negative.  Fact Sheet for Patients:  EntrepreneurPulse.com.au  Fact Sheet for Healthcare Providers:  IncredibleEmployment.be  This test is no t yet approved or cleared by the [place] FDA and  has been authorized for detection and/or diagnosis of SARS-CoV-2 by FDA under an Emergency Use Authorization (EUA). This EUA will remain  in effect (meaning this test can be used) for the duration of the COVID-19 declaration under Section 564(b)(1) of the Act, 21 U.S.C.section 360bbb-3(b)(1), unless the authorization is terminated  or revoked sooner.        Influenza A by PCR NEGATIVE NEGATIVE Final   Influenza B by PCR NEGATIVE NEGATIVE Final    Comment: (NOTE) The Xpert Xpress SARS-CoV-2/FLU/RSV plus assay is intended as an aid in the diagnosis of influenza from Nasopharyngeal swab specimens and should not be used as a sole basis for treatment. Nasal washings and aspirates are unacceptable for Xpert Xpress SARS-CoV-2/FLU/RSV testing.  Fact Sheet for Patients: EntrepreneurPulse.com.au  Fact Sheet for Healthcare Providers: IncredibleEmployment.be  This test is not yet approved or cleared by the [place] FDA and has been authorized for detection and/or diagnosis of SARS-CoV-2 by FDA under an Emergency Use Authorization (EUA). This EUA will remain in effect (meaning this test can be used) for the duration of the  COVID-19 declaration under Section 564(b)(1) of the Act, 21 U.S.C. section 360bbb-3(b)(1), unless the authorization is terminated or revoked.  Performed at [facility], [place] [address], [place], [place] [address]   Blood Culture (routine x 2)     Status: Abnormal   Collection Time: 03/08/21  3:00 PM   Specimen: BLOOD  Result Value Ref Range Status   Specimen Description   Final    BLOOD RIGHT ANTECUBITAL Performed at [place] [address] [place], [place] [address]    Special Requests   Final    BOTTLES DRAWN AEROBIC AND ANAEROBIC Blood Culture adequate volume Performed at [place] [address] [place], [place] [address]    Culture  Setup Time   Final    GRAM POSITIVE COCCI IN BOTH AEROBIC AND ANAEROBIC BOTTLES CRITICAL VALUE NOTED.  VALUE IS CONSISTENT WITH PREVIOUSLY REPORTED AND CALLED VALUE.    Culture (A)  Final    STAPHYLOCOCCUS AUREUS SUSCEPTIBILITIES PERFORMED ON PREVIOUS CULTURE WITHIN THE LAST 5 DAYS. Performed at [facility], [place] [address] [place], [place] [address]    Report Status  03/11/2021 FINAL  Final  Urine Culture     Status: None   Collection Time: 03/08/21  3:41 PM   Specimen: In/Out Cath Urine  Result Value Ref Range Status   Specimen Description   Final    IN/OUT CATH URINE Performed at [place] [address] [place], [place] [address]    Special Requests   Final    NONE Performed at [facility], [place] [address] [place], [place] [address]    Culture   Final    NO GROWTH Performed at [facility], [place] [address] [place], [place] [address]    Report Status 03/09/2021 FINAL  Final  Culture, blood (routine x 2)     Status: None   Collection Time: 03/10/21  3:48 AM   Specimen: BLOOD LEFT HAND  Result Value Ref Range Status   Specimen Description   Final    BLOOD LEFT HAND Performed at [place] [address], [place], [place] [address]    Special Requests   Final    BOTTLES DRAWN AEROBIC AND ANAEROBIC Blood Culture adequate volume Performed at [facility] [address], [place], [place] [address]    Culture   Final    NO GROWTH 5 DAYS Performed at [facility], [place] [address], [place], [place] [address]    Report Status 03/15/2021 FINAL  Final  Culture, blood (routine x 2)     Status: None   Collection Time: 03/10/21  3:55 AM   Specimen: BLOOD LEFT FOREARM  Result Value Ref Range Status   Specimen Description   Final    BLOOD LEFT FOREARM Performed at [facility], [place] [address] [place], [place] [address]    Special Requests   Final    BOTTLES DRAWN AEROBIC ONLY Blood Culture results may not be optimal due to an inadequate volume of blood received in culture bottles Performed at [place] [address], [place], [place] [address]    Culture   Final    NO GROWTH 5 DAYS Performed at [facility], [place] [address], [place], [place] [address]    Report Status 03/15/2021 FINAL  Final  Culture, blood  (routine x 2)     Status: None (Preliminary result)  Collection Time: 03/11/21  3:52 AM   Specimen: BLOOD LEFT HAND  Result Value Ref Range Status   Specimen Description   Final    BLOOD LEFT HAND Performed at [place] [address] [place], [place] [address]    Special Requests   Final    BOTTLES DRAWN AEROBIC ONLY Blood Culture adequate volume Performed at [facility] [address], [place], [place] [address]    Culture   Final    NO GROWTH 4 DAYS Performed at [facility], [place] [address], [place], [place] [address]    Report Status PENDING  Incomplete     Serology:   Imaging: If present, new imagings (plain films, ct scans, and mri) have been personally visualized and interpreted; radiology reports have been reviewed. Decision making incorporated into the Impression / Recommendations.  1/28 MRI LUMBAR AND THORACIC SPINE wwo contrast Pending read  1/23 left shoulder xray No acute osseous abnormality identified.  1/23 tte  1. Left ventricular ejection fraction, by estimation, is 60 to 65%. The  left ventricle has normal function. The left ventricle has no regional  wall motion abnormalities. There is mild left ventricular hypertrophy.  Left ventricular diastolic parameters  were normal.   2. Right ventricular systolic function is normal. The right ventricular  size is normal.   3. The mitral valve is normal in structure. Trivial mitral valve  regurgitation. No evidence of mitral stenosis.   4. The aortic valve is normal in structure. Aortic valve regurgitation is  not visualized. No aortic stenosis is present.   5. The inferior vena cava is normal in size with greater than 50%  respiratory variability, suggesting right atrial pressure of 3 mmHg.    1/22 ct chest 1. Trace bilateral pleural effusions. Mild bilateral lower lobe bronchial wall thickening which could be due to inflammatory process. Partial  consolidations in the lower lobes could reflect atelectasis or mild pneumonia. 2. Possible 7 mm left apical lung nodule. Non-contrast chest CT at 6-12 months is recommended. If the nodule is stable at time of repeat CT, then future CT at 18-24 months (from today's scan) is considered optional for low-risk patients, but is recommended for high-risk patients  1/22 ct cspine 1. No CT evidence for acute intracranial abnormality.  Atrophy. 2. Anterolisthesis C3 on C4 probably due to degenerative change. No definitive fracture is seen. Advanced degenerative changes at multiple levels in the cervical spine.   Jabier Mutton, [place] for Infectious [place] [phone] pager    03/16/2021, 12:33 PM

## 2021-03-16 NOTE — Progress Notes (Signed)
Physical Therapy Treatment Patient Details Name: [name] [name] MRN: [id number] DOB: 07/02/1945 Today's Date: 03/16/2021   History of Present Illness 76 yo female presents to ED on 1/22 with multiple falls, min confusion, hypoxia; workup for sepsis, ARF. CT chest showed bilateral pleural effusions, bilateral lower lobe bronchial wall thickening with consolidations concerning for possible pneumonia, possible 7 mm left apical lung nodule.  recent outpatient Kenalog injection in the left deltoid on 03/06/2021. PMH anxiety, dCHF, HLD, hypothyroidism, IBS, mood disorder, prediabetes, CKD 3b.    PT Comments    Nursing requested assistance to get pt back to bed. +3 assist to get pt out of recliner using STEDY. Transferred pt back to bed using STEDY then stood from [facility] before sitting back on bed. Nursing then took over care. Pt will likely need to have a lift pad in the recliner for nursing to use to assist her back to bed in the future.     Recommendations for follow up therapy are one component of a multi-disciplinary discharge planning process, led by the attending physician.  Recommendations may be updated based on patient status, additional functional criteria and insurance authorization.  Follow Up Recommendations  Skilled nursing-short term rehab (<3 hours/day)     Assistance Recommended at Discharge Frequent or constant Supervision/Assistance  Patient can return home with the following Two people to help with walking and/or transfers;Two people to help with bathing/dressing/bathroom;Assistance with feeding;Assistance with cooking/housework;Assist for transportation;Help with stairs or ramp for entrance   Equipment Recommendations       Recommendations for Other Services       Precautions / Restrictions Precautions Precautions: Fall Restrictions Weight Bearing Restrictions: No     Mobility  Bed Mobility Overal bed mobility: Needs Assistance Bed Mobility: Supine to Sit      Supine to sit: Mod assist, +2 for physical assistance, +2 for safety/equipment, HOB elevated     General bed mobility comments: Mod verbal cues to keep pt on task and for completion of mobility task. Assist for trunk and bil LEs. Utilized bedpad to aid with scooting, positioning. Increased time.    Transfers Overall transfer level: Needs assistance Equipment used: Rolling walker (2 wheels) Transfers: Sit to/from Stand Sit to Stand: Mod assist, +2 physical assistance, +2 safety/equipment           General transfer comment: +3 to rise from recliner using STEDY. Multimodal cueing for pt. Used STEDY to transfer pt back to bed. Increased time. 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)       Balance Overall balance assessment: Needs assistance Sitting-balance support: Bilateral upper extremity supported, Feet supported Sitting balance-Leahy Scale: Fair     Standing balance support: Bilateral upper extremity supported Standing balance-Leahy Scale: Poor                              Cognition Arousal/Alertness: Awake/alert Behavior During Therapy: WFL for tasks assessed/performed Overall Cognitive Status: No family/caregiver present to determine baseline cognitive functioning Area of Impairment: Problem solving, Safety/judgement, Following commands                       Following Commands: Follows one step commands inconsistently  Safety/Judgement: Decreased awareness of deficits, Decreased awareness of safety   Problem Solving: Slow processing, Requires verbal cues, Requires tactile cues, Difficulty sequencing General Comments: Patient is A/O x3 however very tangential needing frequent cues to redirect back to task        Exercises      General Comments        Pertinent  Vitals/Pain Pain Assessment Pain Assessment: Faces Faces Pain Scale: Hurts even more Pain Location: neck, back Pain Descriptors / Indicators: Discomfort, Sore, Aching Pain Intervention(s): Limited activity within patient's tolerance, Monitored during session, Repositioned    Home Living                          Prior Function            PT Goals (current goals can now be found in the care plan section) Progress towards PT goals: Progressing toward goals    Frequency    Min 2X/week      PT Plan Current plan remains appropriate    Co-evaluation              AM-PAC PT "6 Clicks" Mobility   Outcome Measure  Help needed turning from your back to your side while in a flat bed without using bedrails?: A Lot Help needed moving from lying on your back to sitting on the side of a flat bed without using bedrails?: A Lot Help needed moving to and from a bed to a chair (including a wheelchair)?: Total Help needed standing up from a chair using your arms (e.g., wheelchair or bedside chair)?: Total Help needed to walk in hospital room?: Total Help needed climbing 3-5 steps with a railing? : Total 6 Click Score: 8    End of Session Equipment Utilized During Treatment: Oxygen Activity Tolerance: Patient limited by fatigue Patient left:  (sitting EOB with nursing take over care)   PT Visit Diagnosis: Other abnormalities of gait and mobility (R26.89);Muscle weakness (generalized) (M62.81)     Time: 4656-8127 PT Time Calculation (min) (ACUTE ONLY): 8 min  Charges:  $Therapeutic Activity: 8-22 mins                        Doreatha Massed, PT Acute Rehabilitation  Office: [phone] Pager: [phone]

## 2021-03-16 NOTE — NC FL2 (Signed)
[place] LEVEL OF CARE SCREENING TOOL     IDENTIFICATION  Patient Name: [name] [name] Birthdate: 02-Sep-1945 Sex: female Admission Date (Current Location): 03/08/2021  [facility] and [place] Number:  Herbalist and Address:  [facility],  [place] [place], [place]      Provider Number: [id number]  Attending Physician Name and Address:  Annita Brod, MD  Relative Name and Phone Number:       Current Level of Care: Hospital Recommended Level of Care: [place] Prior Approval Number:    Date Approved/Denied:   PASRR Number: [id number]  Discharge Plan: SNF    Current Diagnoses: Patient Active Problem List   Diagnosis Date Noted   Ritta Slot, oral 03/12/2021   Morbid obesity ([place]) 71/69/6789   Acute metabolic encephalopathy 38/11/1749   Bacteremia 03/09/2021   Sepsis ([place]) 03/08/2021   Acute renal failure superimposed on stage 3b chronic kidney disease ([place]) 03/08/2021   Elevated LFTs 03/08/2021   DMII (diabetes mellitus, type 2) ([place]) 03/08/2021   Normocytic anemia 03/08/2021   Fall at home, initial encounter 03/08/2021   Acute respiratory failure with hypoxia ([place]) 03/08/2021   Hypothyroidism 03/08/2021   Chronic diastolic heart failure ([place]) 01/01/2021   Nonrheumatic mitral valve regurgitation 01/01/2021   Stage 3a chronic kidney disease ([place]) 01/01/2021   Genetic testing 08/10/2019   Family history of breast cancer    Family history of colon cancer    Malignant neoplasm of upper-inner quadrant of right breast in female, estrogen receptor positive ([place]) 07/30/2019   Osteopenia 09/10/2016   Vaginal atrophy 09/10/2015   Menopause 09/10/2015    Orientation RESPIRATION BLADDER Height & Weight     Self, Time, Situation, Place  Normal Continent Weight: 85.5 kg Height:     BEHAVIORAL SYMPTOMS/MOOD NEUROLOGICAL BOWEL NUTRITION STATUS      Continent Diet (REGULAR)  AMBULATORY STATUS COMMUNICATION OF  NEEDS Skin   Extensive Assist Verbally Normal                       Personal Care Assistance Level of Assistance  Bathing, Feeding, Dressing Bathing Assistance: Limited assistance Feeding assistance: Limited assistance Dressing Assistance: Limited assistance     Functional Limitations Info  Sight, Hearing, Speech Sight Info: Adequate Hearing Info: Adequate Speech Info: Adequate    SPECIAL CARE FACTORS FREQUENCY  PT (By licensed PT), OT (By licensed OT)     PT Frequency: 5 X WEEKLY OT Frequency: 5 X WEEKLY            Contractures Contractures Info: Not present    Additional Factors Info  Code Status Code Status Info: FULL             Current Medications (03/16/2021):  This is the current hospital active medication list Current Facility-Administered Medications  Medication Dose Route Frequency Provider Last Rate Last Admin   acetaminophen (TYLENOL) tablet 650 mg  650 mg Oral Q6H PRN Dwyane Dee, MD   650 mg at 03/16/21 0813   Or   acetaminophen (TYLENOL) suppository 650 mg  650 mg Rectal Q6H PRN Dwyane Dee, MD       bisacodyl (DULCOLAX) suppository 10 mg  10 mg Rectal Daily PRN Annita Brod, MD   10 mg at 03/12/21 0258   carbamazepine (TEGRETOL XR) 12 hr tablet 200 mg  200 mg Oral Standley Brooking, MD   200 mg at 03/15/21 2122   ceFAZolin (ANCEF) IVPB 2g/100 mL premix  2 g Intravenous Q8H Lenis Noon, [place] 200 mL/hr at 03/16/21 4132 2 g at 03/16/21 4401   chlorhexidine (PERIDEX) 0.12 % solution 15 mL  15 mL Mouth Rinse BID Annita Brod, MD   15 mL at 03/16/21 0811   dextrose 5 % solution   Intravenous Continuous Annita Brod, MD 50 mL/hr at 03/15/21 1812 New Bag at 03/15/21 1812   fluconazole (DIFLUCAN) IVPB 200 mg  200 mg Intravenous Q24H Annita Brod, MD 100 mL/hr at 03/15/21 1243 200 mg at 03/15/21 1243   heparin injection 5,000 Units  5,000 Units Subcutaneous Lenise Arena, MD   5,000 Units at 03/16/21 0272    hydrALAZINE (APRESOLINE) injection 10 mg  10 mg Intravenous Q4H PRN Dwyane Dee, MD       insulin aspart (novoLOG) injection 0-5 Units  0-5 Units Subcutaneous Standley Brooking, MD   2 Units at 03/15/21 2122   insulin aspart (novoLOG) injection 0-9 Units  0-9 Units Subcutaneous TID WC Dwyane Dee, MD   2 Units at 03/16/21 0810   labetalol (NORMODYNE) injection 10 mg  10 mg Intravenous Q4H PRN Dwyane Dee, MD   10 mg at 03/12/21 2010   levothyroxine (SYNTHROID) tablet 88 mcg  88 mcg Oral Q0600 Dwyane Dee, MD   88 mcg at 03/16/21 5366   MEDLINE mouth rinse  15 mL Mouth Rinse q12n4p Annita Brod, MD   15 mL at 03/15/21 1600   naphazoline-glycerin (CLEAR EYES REDNESS) ophth solution 1-2 drop  1-2 drop Both Eyes QID PRN Lovey Newcomer T, NP   2 drop at 03/09/21 2056   ondansetron (ZOFRAN) injection 4 mg  4 mg Intravenous Q8H PRN Lovey Newcomer T, NP   4 mg at 03/09/21 0417   sodium chloride flush (NS) 0.9 % injection 3 mL  3 mL Intravenous Eddie Candle, MD   3 mL at 03/16/21 4403     Discharge Medications: Please see discharge summary for a list of discharge medications.  Relevant Imaging Results:  Relevant Lab Results:   Additional Information SSN:[id number]  Leeroy Cha, RN

## 2021-03-16 NOTE — Progress Notes (Signed)
[facility] Progress Note  Patient: [name] [name]    ZOX:[id number]  DOA: 03/08/2021    Date of Service: the patient was seen and examined on 03/16/2021  Brief hospital course: 76 year old female with past medical history of hypothyroidism, diastolic CHF, stage IIIb chronic kidney disease and mood disorder presented to the emergency room on 03/08/2021 after multiple falls at home.  Although poor historian, patient is noted to have slow decline.  In the emergency room, found to be hypoxic and septic from pneumonia.  Following admission, blood cultures positive for MSSA.  Work-up ongoing to ensure source.  ID plans for TEE next week.  Although patient less somnolent, remains confused.  Due to poor p.o. intake, patient developed hypernatremia which was treated with D5W.  Patient is status post MRI of thoracic/lumbar spine with general anesthesia on 1/28.  Assessment and Plan: * Sepsis ([place])- (present on admission) - Febrile, tachycardia, tachypnea, leukocytosis; presumed lung source given opacities on CT chest with possible aspiration however now having worsening left shoulder pain (s/p L deltoid kenalog injection on 03/06/21) and ongoing low back pain.  White count has been steadily improving for several days. - Given vancomycin, cefepime, Flagyl in the ER - No significant risk factors for MDRO, then changed to CTX/azithro after blood cultures grew MSSA, will change her to Unasyn.  No further fevers times greater than 24 hours.  ID following.  Status post MRI of thoracic/lumbar spine 1/28, results are pending  Bacteremia- (present on admission) - 4/4 bottles positive on admission (1/22) with MSSA. Differential for etiology at this time includes recent outpt steroid injection on 1/20 to Left deltoid vs vertebral infection (worsening lower back pain) vs spontaneous bacteremia vs translocation from pulmonary source - abx changed to Unasyn which should cover presumed sources for now - obtain MRI left  shoulder. ID has ordered MRI T/L spine.  Unfortunately, patient too agitated to stay still, so trying to reorder with general anesthesia.   - TTE on 1/23: some poor images but no obvious vegetations (EF 60-65%, mild LVH).   Blood cultures from 1/24 1/25 with no growth to date  Acute respiratory failure with hypoxia ([place])- (present on admission) - Presumed due to underlying pneumonia/aspiration -Patient doing well.  Down to 2 L nasal cannula to 100%.  Attempting to wean off oxygen altogether - See sepsis and bacteremia work-up otherwise  Acute renal failure superimposed on stage 3b chronic kidney disease ([place])- (present on admission) - patient has history of CKD3b. Baseline creat ~ 1.3, eGFR 38 -Creatinine on admission at 2.47 and with fluids has been slowly improving.  Labs from today pending.  Creatinine down to 1.49.  Hypernatremia-resolved as of 03/14/2021 Due to poor p.o. intake from altered mentation.  Have changed IV fluids to half-normal saline.  Started on D5W after half-normal saline not improving sodium.  Peaking at 157, sodium has since improved with D5W.  Acute metabolic encephalopathy Patient more awake now.  Some of this may have been due to uremia and hypernatremia.  Significantly improved, patient today is alert and oriented W1-1  Chronic diastolic heart failure ([place])- (present on admission) - No S/S exacerbation on exam.  Patient is volume depleted clinically Because of need for D5W, has been getting fluids at 125 cc an hour.  BNP elevated given additional fluids.  Waiting for 1/29 labs and then can stop fluids and then start Lasix. -Last echo reviewed from 11/06/2020: Moderate MR, impaired relaxation, EF 65% - TTE repeated on 1/23, EF 60-65%, mild LVH;  trivial MR on read; normal diastology   Hypothyroidism- (present on admission) - TSH, 0.429 - continue Synthroid  DMII (diabetes mellitus, type 2) (HCC) - History of prediabetes, no A1c on file - A1c on admission 6.6% -  Treat with sliding scale for now and diet control  CBGs have been trending higher in the low 200s due to being on D5W  Elevated LFTs- (present on admission) Suspect secondary to shock liver from sepsis.  Transaminases slowly improving  Hyponatremia-resolved as of 03/09/2021, (present on admission) - presumed hypovolemic hyponatremia.  Resolved with IV fluids  Morbid obesity ([place])- (present on admission) Meets criteria for BMI greater than 35+ comorbidities of heart failure and diabetes and hypertension  Thrush, oral Started on fluconazole.  Slowly improving.  Fall at home, initial encounter - Patient found on floor by EMS on their arrival.  Patient states floor is carpeted although she cannot provide any further collateral information regarding her fall or length of time on the floor - Given renal failure on admission which is still likely prerenal, check CK - Multiple imaging studies on admission including right wrist x-ray, CT head, CT cervical spine, and CT chest: All with no acute abnormalities but does have 4 mm anterolisthesis C3 on C4 - continue pain control - will consult PT once more stable   Normocytic anemia- (present on admission) - No complaints or signs of bleeding.  No recent lab work other than July 2021 (12.3 g/dL at that time).  Hemoglobin stable since admission.    Body mass index is 36.81 kg/m.        Consultants: Infectious disease  Procedures: Echocardiogram-no vegetations noted MRI under general anesthesia 1/28  Antimicrobials: Continue IV Ancef  Code Status: Full code   Subjective: Patient much more awake and alert.  Some dried tongue secondary to thrush. Objective: Noted some minimal tachycardia labs reviewed noting slightly increasing white count to 17 and improvement in renal function with creatinine down to 1.47. Vitals:   03/16/21 0444 03/16/21 1407  BP: (!) 158/66 102/87  Pulse: 87 (!) 106  Resp: 20 18  Temp: 99.2 F (37.3 C) 98.7 F  (37.1 C)  SpO2: 100% 100%    Intake/Output Summary (Last 24 hours) at 03/16/2021 1524 Last data filed at 03/16/2021 1304 Gross per 24 hour  Intake 1487.49 ml  Output 1450 ml  Net 37.49 ml    Filed Weights   03/08/21 2041  Weight: 85.5 kg   Body mass index is 36.81 kg/m.  Exam:  General: Awake, no acute distress, alert and oriented x2-3 HEENT: Normocephalic and atraumatic, mucous membranes slightly dry, noted oral thrush on her tongue-continues to improve Cardiovascular: Regular rate and rhythm, S1-S2 Respiratory: Good inspiratory effort, decreased breath sounds throughout in part due to body habitus Abdomen: Soft,?  Mild nonspecific tenderness, nondistended, hypoactive bowel sounds Musculoskeletal: No clubbing or cyanosis or edema.  Right wrist minimal tenderness to flexion Skin: No skin breaks, tears or lesions Psychiatry: Appropriate, no evidence of psychoses Neurology: No focal deficits  Data Reviewed: Labs reviewed.  No labs from today due to lab error.  Labs to be redrawn and are pending.  Disposition:  Status is: Inpatient  Remains inpatient appropriate because: Continued work-up and treatment for infection    Family Communication: Left message for daughter DVT Prophylaxis: heparin injection 5,000 Units Start: 03/08/21 2200    Author: Annita Brod ,MD 03/16/2021 3:24 PM  To reach On-call, see care teams to locate the attending and reach out via [email or web address].  Between 7PM-7AM, please contact night-coverage If you still have difficulty reaching the attending provider, please page the [facility] (Director on Call) for [facility] on amion for assistance.

## 2021-03-17 DIAGNOSIS — I5032 Chronic diastolic (congestive) heart failure: Secondary | ICD-10-CM | POA: Diagnosis not present

## 2021-03-17 DIAGNOSIS — A419 Sepsis, unspecified organism: Secondary | ICD-10-CM | POA: Diagnosis not present

## 2021-03-17 DIAGNOSIS — A4189 Other specified sepsis: Secondary | ICD-10-CM

## 2021-03-17 DIAGNOSIS — M4620 Osteomyelitis of vertebra, site unspecified: Secondary | ICD-10-CM | POA: Diagnosis not present

## 2021-03-17 DIAGNOSIS — N179 Acute kidney failure, unspecified: Secondary | ICD-10-CM | POA: Diagnosis not present

## 2021-03-17 DIAGNOSIS — G062 Extradural and subdural abscess, unspecified: Secondary | ICD-10-CM | POA: Diagnosis not present

## 2021-03-17 DIAGNOSIS — R6520 Severe sepsis without septic shock: Secondary | ICD-10-CM

## 2021-03-17 DIAGNOSIS — R7881 Bacteremia: Secondary | ICD-10-CM | POA: Diagnosis not present

## 2021-03-17 DIAGNOSIS — B370 Candidal stomatitis: Secondary | ICD-10-CM | POA: Diagnosis not present

## 2021-03-17 DIAGNOSIS — M546 Pain in thoracic spine: Secondary | ICD-10-CM

## 2021-03-17 DIAGNOSIS — R1310 Dysphagia, unspecified: Secondary | ICD-10-CM

## 2021-03-17 DIAGNOSIS — A4101 Sepsis due to Methicillin susceptible Staphylococcus aureus: Secondary | ICD-10-CM | POA: Diagnosis not present

## 2021-03-17 DIAGNOSIS — N183 Chronic kidney disease, stage 3 unspecified: Secondary | ICD-10-CM

## 2021-03-17 LAB — BASIC METABOLIC PANEL
Anion gap: 8 (ref 5–15)
BUN: 43 mg/dL — ABNORMAL HIGH (ref 8–23)
CO2: 21 mmol/L — ABNORMAL LOW (ref 22–32)
Calcium: 9 mg/dL (ref 8.9–10.3)
Chloride: 113 mmol/L — ABNORMAL HIGH (ref 98–111)
Creatinine, Ser: 1.57 mg/dL — ABNORMAL HIGH (ref 0.44–1.00)
GFR, Estimated: 34 mL/min — ABNORMAL LOW (ref >60)
Glucose, Bld: 175 mg/dL — ABNORMAL HIGH (ref 70–99)
Potassium: 4 mmol/L (ref 3.5–5.1)
Sodium: 142 mmol/L (ref 135–145)

## 2021-03-17 LAB — CBC
HCT: 31.7 % — ABNORMAL LOW (ref 36.0–46.0)
Hemoglobin: 9.9 g/dL — ABNORMAL LOW (ref 12.0–15.0)
MCH: 31.5 pg (ref 26.0–34.0)
MCHC: 31.2 g/dL (ref 30.0–36.0)
MCV: 101 fL — ABNORMAL HIGH (ref 80.0–100.0)
Platelets: 308 10*3/uL (ref 150–400)
RBC: 3.14 MIL/uL — ABNORMAL LOW (ref 3.87–5.11)
RDW: 14.3 % (ref 11.5–15.5)
WBC: 19.3 10*3/uL — ABNORMAL HIGH (ref 4.0–10.5)
nRBC: 0 % (ref 0.0–0.2)

## 2021-03-17 LAB — GLUCOSE, CAPILLARY
Glucose-Capillary: 217 mg/dL — ABNORMAL HIGH (ref 70–99)
Glucose-Capillary: 142 mg/dL — ABNORMAL HIGH (ref 70–99)
Glucose-Capillary: 162 mg/dL — ABNORMAL HIGH (ref 70–99)
Glucose-Capillary: 127 mg/dL — ABNORMAL HIGH (ref 70–99)

## 2021-03-17 LAB — BRAIN NATRIURETIC PEPTIDE: B Natriuretic Peptide: 98.5 pg/mL (ref 0.0–100.0)

## 2021-03-17 MED ORDER — FLUCONAZOLE 100 MG PO TABS
200.0000 mg | ORAL_TABLET | Freq: Every day | ORAL | Status: AC
  Administered 2021-03-18 – 2021-03-25 (×8): 200 mg via ORAL
  Filled 2021-03-17 (×8): qty 2

## 2021-03-17 MED ORDER — FUROSEMIDE 10 MG/ML IJ SOLN
20.0000 mg | Freq: Two times a day (BID) | INTRAMUSCULAR | Status: DC
  Administered 2021-03-17 – 2021-03-21 (×9): 20 mg via INTRAVENOUS
  Filled 2021-03-17 (×9): qty 2

## 2021-03-17 NOTE — TOC Progression Note (Signed)
Transition of Care [facility]) - Progression Note    Patient Details  Name: [name] [name] MRN: [id number] Date of Birth: [birth year]  [facility] CM/SW Contact  Leeroy Cha, RN Phone Number: 03/17/2021, 10:17 AM  Clinical Narrative:    Patient is to go to snf post hospital stay.  Has chosen to go to [employer] [facility].  Will let [facility] know and start insurance auth.   Expected Discharge Plan: [place] Barriers to Discharge: Continued Medical Work up  Expected Discharge Plan and Services Expected Discharge Plan: [place]   Discharge Planning Services: CM Consult   Living arrangements for the past 2 months: Single Family Home                                       Social Determinants of Health (SDOH) Interventions    Readmission Risk Interventions No flowsheet data found.

## 2021-03-17 NOTE — Progress Notes (Signed)
[facility] Progress Note  Patient: [name] [name]    ATF:[id number]  DOA: 03/08/2021    Date of Service: the patient was seen and examined on 03/17/2021  Brief hospital course: 76 year old female with past medical history of hypothyroidism, diastolic CHF, stage IIIb chronic kidney disease and mood disorder presented to the emergency room on 03/08/2021 after multiple falls at home.  Although poor historian, patient is noted to have slow decline.  In the emergency room, found to be hypoxic and septic from pneumonia.  Following admission, blood cultures positive for MSSA.  Work-up ongoing to ensure source.  ID plans for TEE next week.  Although patient less somnolent, remains confused.  Due to poor p.o. intake, patient developed hypernatremia which was treated with D5W.  Patient is status post MRI of thoracic/lumbar spine with general anesthesia on 1/28.  MRI noted surprising results noting widespread enhancement of the lower thoracic and lumbar regions involving the dura as well as surface of conus and cauda equina nerve roots with some subdural collections in the lumbar spine.  Discussed case with neurosurgery and after reviewing studies, they do not see any findings for frank epidural abscess in these regions, and therefore no role for open surgical intervention.  Given enhancement of leptomeninges, neurosurgery recommended infection has spread into CNS and dosing antibiotics for CNS infection recommended.  Plan by infectious disease is to continue on Ancef checking CRP every 72 hours and change antibiotics to nafcillin if CRP worsens or mentation worsens.  Assessment and Plan: Vertebral osteomyelitis ([place])- (present on admission) Appreciate infectious disease and neurosurgery assistance.  For now on IV Ancef.  Follow CRP and procalcitonin.  Patient declines TEE.  Sepsis ([place])- (present on admission) - Febrile, tachycardia, tachypnea, leukocytosis; presumed lung source given opacities on CT chest  with possible aspiration however now having worsening left shoulder pain (s/p L deltoid kenalog injection on 03/06/21) and ongoing low back pain.  White count has been steadily improving for several days. - Given vancomycin, cefepime, Flagyl in the ER - No significant risk factors for MDRO, then changed to CTX/azithro after blood cultures grew MSSA, will change her to Unasyn.  No further fevers times greater than 24 hours.  ID following.  Status post MRI of thoracic/lumbar spine 1/28, results are pending  Bacteremia- (present on admission) - 4/4 bottles positive on admission (1/22) with MSSA. Differential for etiology at this time includes recent outpt steroid injection on 1/20 to Left deltoid vs vertebral infection (worsening lower back pain) vs spontaneous bacteremia vs translocation from pulmonary source - abx changed to Unasyn which should cover presumed sources for now - obtain MRI left shoulder. ID has ordered MRI T/L spine.  Unfortunately, patient too agitated to stay still, so trying to reorder with general anesthesia.   - TTE on 1/23: some poor images but no obvious vegetations (EF 60-65%, mild LVH).   Blood cultures from 1/24 1/25 with no growth to date  Acute renal failure superimposed on stage 3b chronic kidney disease ([place])- (present on admission) - patient has history of CKD3b. Baseline creat ~ 1.3, eGFR 38 -Creatinine on admission at 2.47 and with fluids, had improved, down to 1.45 on 1/29.  Since then, mild increase in today at 1.57.  Questioning if this could be some acute heart failure and cardiorenal disease.  We will stop fluids and give Lasix and follow accordingly.  In review of her MRI, it notes bilateral hydronephrosis.  Patient has a pure wick catheter and is able to urinate.  Will check in and out catheterization and see if this helps.  If renal function worsens, consider renal ultrasound and indwelling Foley catheter.  Chronic diastolic heart failure ([place])- (present on  admission) - No S/S exacerbation on exam.  Patient is volume depleted clinically Because of need for D5W, has been getting fluids at 125 cc an hour.  BNP elevated given additional fluids.  Repeat BNP on 1/31 normal.  At this point now, we will stop fluids and start Lasix and follow renal function appropriately. -Last echo reviewed from 11/06/2020: Moderate MR, impaired relaxation, EF 65% - TTE repeated on 1/23, EF 60-65%, mild LVH; trivial MR on read; normal diastology   DMII (diabetes mellitus, type 2) (HCC) - History of prediabetes, no A1c on file - A1c on admission 6.6% - Treat with sliding scale for now and diet control  CBGs have been trending higher in the low 200s due to being on D5W  Hypernatremia-resolved as of 03/14/2021 Due to poor p.o. intake from altered mentation.  Have changed IV fluids to half-normal saline.  Started on D5W after half-normal saline not improving sodium.  Peaking at 157, sodium has since improved with D5W.  Hypothyroidism- (present on admission) - TSH, 0.429 - continue Synthroid  Acute metabolic encephalopathy-resolved as of 03/17/2021 Patient more awake now.  Some of this may have been due to initial sepsis, uremia and hypernatremia.  Appears to have resolved.  Patient now is alert and oriented x3.  Acute respiratory failure with hypoxia (HCC)-resolved as of 03/17/2021, (present on admission) - Presumed due to underlying pneumonia/aspiration -Patient doing well.  Down to 2 L nasal cannula to 100%.  Attempting to wean off oxygen altogether - See sepsis and bacteremia work-up otherwise  Elevated LFTs- (present on admission) Suspect secondary to shock liver from sepsis.  Transaminases slowly improving  Hyponatremia-resolved as of 03/09/2021, (present on admission) - presumed hypovolemic hyponatremia.  Resolved with IV fluids  Morbid obesity ([place])- (present on admission) Meets criteria for BMI greater than 35+ comorbidities of heart failure and diabetes and  hypertension  Thrush, oral Started on fluconazole.  Slowly improving.  Fall at home, initial encounter - Patient found on floor by EMS on their arrival.  Patient states floor is carpeted although she cannot provide any further collateral information regarding her fall or length of time on the floor - Given renal failure on admission which is still likely prerenal, check CK - Multiple imaging studies on admission including right wrist x-ray, CT head, CT cervical spine, and CT chest: All with no acute abnormalities but does have 4 mm anterolisthesis C3 on C4 - continue pain control - will consult PT once more stable   Normocytic anemia- (present on admission) - No complaints or signs of bleeding.  No recent lab work other than July 2021 (12.3 g/dL at that time).  Hemoglobin stable since admission.    Body mass index is 36.81 kg/m.        Consultants: Infectious disease Neurosurgery  Procedures: Echocardiogram-no vegetations noted MRI under general anesthesia 1/28  Antimicrobials: Continue IV Ancef  Code Status: Full code   Subjective: Patient awake and alert x3, no acute distress Objective: Noted elevated blood pressures. Vitals:   03/17/21 0559 03/17/21 1501  BP: (!) 155/60 (!) 170/58  Pulse: 100 (!) 108  Resp: 17   Temp: 98.7 F (37.1 C) 99 F (37.2 C)  SpO2: 94% 100%    Intake/Output Summary (Last 24 hours) at 03/17/2021 1739 Last data filed at 03/17/2021 1600 Gross  per 24 hour  Intake 2439.44 ml  Output 2750 ml  Net -310.56 ml    Filed Weights   03/08/21 2041  Weight: 85.5 kg   Body mass index is 36.81 kg/m.  Exam:  General: Awake, no acute distress, alert and oriented x3 HEENT: Normocephalic and atraumatic, minimal thrush Cardiovascular: Regular rate and rhythm, S1-S2 Respiratory: Good inspiratory effort, decreased breath sounds throughout in part due to body habitus Abdomen: Soft, nontender, nondistended, positive bowel sounds Musculoskeletal:  No clubbing or cyanosis trace pitting edema Skin: No skin breaks, tears or lesions Psychiatry: Appropriate, no evidence of psychoses Neurology: No focal deficits  Data Reviewed: Labs reviewed.  Noted slightly worsening creatinine, normal BNP, improving procalcitonin and white blood cell count which is continued to trend upward.  Disposition:  Status is: Inpatient  Remains inpatient appropriate because: Continued work-up and treatment for unusual spinal infection    Family Communication: Left message for daughter DVT Prophylaxis: heparin injection 5,000 Units Start: 03/08/21 2200    Author: Annita Brod ,MD 03/17/2021 5:39 PM  To reach On-call, see care teams to locate the attending and reach out via [email or web address]. Between 7PM-7AM, please contact night-coverage If you still have difficulty reaching the attending provider, please page the [facility] (Director on Call) for [facility] on amion for assistance.

## 2021-03-17 NOTE — Plan of Care (Signed)
  Problem: Clinical Measurements: Goal: Ability to maintain clinical measurements within normal limits will improve Outcome: Progressing   Problem: Nutrition: Goal: Adequate nutrition will be maintained Outcome: Progressing   Problem: Pain Managment: Goal: General experience of comfort will improve Outcome: Progressing   Problem: Safety: Goal: Ability to remain free from injury will improve Outcome: Progressing   

## 2021-03-17 NOTE — Progress Notes (Signed)
[facility] for Infectious Disease  Date of Admission:  03/08/2021     Abx: 1/24-c cefazolin 1/26-c fluconazole  1/22-24 vanc/cefepime/flagyl --> amp/sulb  ASSESSMENT: Mssa bacteremia community acquired Epidural abscess/phlegmon Severe sepsis thrush Back pain Metabolic encephalopathy resolved AKI on Ckd3  75 yo female admitted 1/23 after slidding off her bed unable to get up, found to have severe sepsis/mssa bacteremia and metabolic encephalopathy  3/76 and 1/25 repeat bcx negative 1/22 bcx 2 of 2 set mssa (S tetra, bactrim)  Mentating much better Initial dysphagia in setting metabolic encephalopathy cleared by speech for oral intake  Initial left shoulder pain no longer present -- exam 1/30 without shoulder joint tenderness or decreased passive rom  Nonspecific mid/upper back pain awaiting read on thoracic and lumbar mri from 1/28  Tte no obvious vegetation; tee refused last week as dysphagic. Pending mri read will see if tee needed  On thrush treatment -- resolving; ?hospital onset. Hiv negative. Not on any steroid inhaler previously and doesn't appear to be severely malnutritious and no recent abx outside of what is being given here  -------- 1/31 id assessment Mentation vastly improved since admission and stable well the past 24 hours No sign of sepsis Extensive involvement on lumbar/thoracic mri. Query true intracranial process vs parameningeal changes. She has no meningeal or sign of inctracranial involvement clinically.  Typically nafcillin or oxacillin is used for intracranial involvement or high innoculum not responding to cefazolin. It has high incidence of AIN, transaminitis, and allergy response than cefazolin otherwise so would avoid unless absolute indication.   Will closely follow and trend crp. A repeat mri would be needed in 1-2 weeks  Given the t1-2 facet joint involvement, I worry she could have higher involvement but she refused c-spine  mri at this time. And tee will not affect management at this time so will defer   PLAN: Continue cefazolin Q72 hours crp If worsening crp or mentation or mri finding consider changing to nafcillin for 2 weeks then switch back to cefazolin Previous id assessment concerned for esophageal involvement with thrush so will do full 14 days to finish on 2/09 with fluconazole Discussed with primary team  I spent more than 35 minute reviewing data/chart, and coordinating care and >50% direct face to face time providing counseling/discussing diagnostics/treatment plan with patient    Principal Problem:   Sepsis ([place]) Active Problems:   Chronic diastolic heart failure ([place])   Acute renal failure superimposed on stage 3b chronic kidney disease ([employer])   Elevated LFTs   DMII (diabetes mellitus, type 2) ([place])   Normocytic anemia   Fall at home, initial encounter   Acute respiratory failure with hypoxia ([place])   Hypothyroidism   Bacteremia   Morbid obesity ([place])   Acute metabolic encephalopathy   Thrush, oral   No Known Allergies  Scheduled Meds:  carbamazepine  200 mg Oral QHS   chlorhexidine  15 mL Mouth Rinse BID   heparin  5,000 Units Subcutaneous Q8H   insulin aspart  0-5 Units Subcutaneous QHS   insulin aspart  0-9 Units Subcutaneous TID WC   levothyroxine  88 mcg Oral Q0600   mouth rinse  15 mL Mouth Rinse q12n4p   sodium chloride flush  3 mL Intravenous Q12H   Continuous Infusions:  sodium chloride 75 mL/hr at 03/17/21 1244    ceFAZolin (ANCEF) IV 2 g (03/17/21 0605)   fluconazole (DIFLUCAN) IV 200 mg (03/17/21 1308)   PRN Meds:.acetaminophen **OR**  acetaminophen, bisacodyl, hydrALAZINE, labetalol, naphazoline-glycerin, ondansetron (ZOFRAN) IV   SUBJECTIVE: Stable generalized back discomfort without neck pain other joint pain or LE radiculopathic sx No f/c NSG evaluated -- no surgical intervention No n/v/diarrhea Mentating well  Doesn't want another mri at this  time   Review of Systems: ROS All other ROS was negative, except mentioned above     OBJECTIVE: Vitals:   03/16/21 1407 03/16/21 2014 03/16/21 2200 03/17/21 0559  BP: 102/87 (!) 188/74 (!) 156/62 (!) 155/60  Pulse: (!) 106 (!) 105  100  Resp: 18 18  17   Temp: 98.7 F (37.1 C) 98.7 F (37.1 C)  98.7 F (37.1 C)  TempSrc: Oral Oral  Oral  SpO2: 100% 97%  94%  Weight:       Body mass index is 36.81 kg/m.  Physical Exam  General/constitutional: no distress, pleasant; conversant HEENT: Normocephalic, PER, Conj Clear, EOMI, Oropharynx clear Neck supple CV: rrr no mrg Lungs: clear to auscultation, normal respiratory effort Abd: Soft, Nontender Ext: no edema Skin: No Rash Neuro: generalized weakness  MSK: no peripheral joint swelling/tenderness/warmth   Lab Results Lab Results  Component Value Date   WBC 19.3 (H) 03/17/2021   HGB 9.9 (L) 03/17/2021   HCT 31.7 (L) 03/17/2021   MCV 101.0 (H) 03/17/2021   PLT 308 03/17/2021    Lab Results  Component Value Date   CREATININE 1.57 (H) 03/17/2021   BUN 43 (H) 03/17/2021   NA 142 03/17/2021   K 4.0 03/17/2021   CL 113 (H) 03/17/2021   CO2 21 (L) 03/17/2021    Lab Results  Component Value Date   ALT 14 03/14/2021   AST 44 (H) 03/14/2021   ALKPHOS 76 03/14/2021   BILITOT 0.5 03/14/2021      Microbiology: Recent Results (from the past 240 hour(s))  Blood Culture (routine x 2)     Status: Abnormal   Collection Time: 03/08/21  2:37 PM   Specimen: BLOOD  Result Value Ref Range Status   Specimen Description   Final    BLOOD LEFT ANTECUBITAL Performed at [facility], [place] [address], [place], [place] [address]    Special Requests   Final    BOTTLES DRAWN AEROBIC AND ANAEROBIC Blood Culture results may not be optimal due to an excessive volume of blood received in culture bottles Performed at [facility] [address] [place], [place] [address]    Culture  Setup Time    Final    GRAM POSITIVE COCCI IN BOTH AEROBIC AND ANAEROBIC BOTTLES CRITICAL RESULT CALLED TO, READ BACK BY AND VERIFIED WITH: M LILLISTON,[id number]  03/09/21 [place] Performed at [facility], [place] [address], [place], [place] [address]    Culture STAPHYLOCOCCUS AUREUS (A)  Final   Report Status 03/11/2021 FINAL  Final   Organism ID, Bacteria STAPHYLOCOCCUS AUREUS  Final      Susceptibility   Staphylococcus aureus - MIC*    CIPROFLOXACIN <=0.5 SENSITIVE Sensitive     ERYTHROMYCIN <=0.25 SENSITIVE Sensitive     GENTAMICIN <=0.5 SENSITIVE Sensitive     OXACILLIN 0.5 SENSITIVE Sensitive     TETRACYCLINE <=1 SENSITIVE Sensitive     VANCOMYCIN <=0.5 SENSITIVE Sensitive     TRIMETH/SULFA <=10 SENSITIVE Sensitive     CLINDAMYCIN <=0.25 SENSITIVE Sensitive     RIFAMPIN <=0.5 SENSITIVE Sensitive     Inducible Clindamycin NEGATIVE Sensitive     * STAPHYLOCOCCUS AUREUS  Blood Culture ID Panel (Reflexed)  Status: Abnormal   Collection Time: 03/08/21  2:37 PM  Result Value Ref Range Status   Enterococcus faecalis NOT DETECTED NOT DETECTED Final   Enterococcus Faecium NOT DETECTED NOT DETECTED Final   Listeria monocytogenes NOT DETECTED NOT DETECTED Final   Staphylococcus species DETECTED (A) NOT DETECTED Final    Comment: CRITICAL RESULT CALLED TO, READ BACK BY AND VERIFIED WITH: M LILLISTON,[id number]  03/09/21 [place]    Staphylococcus aureus (BCID) DETECTED (A) NOT DETECTED Final    Comment: CRITICAL RESULT CALLED TO, READ BACK BY AND VERIFIED WITH: M LILLISTON,[email or web address]@[id number]  03/09/21 [place]    Staphylococcus epidermidis NOT DETECTED NOT DETECTED Final   Staphylococcus lugdunensis NOT DETECTED NOT DETECTED Final   Streptococcus species NOT DETECTED NOT DETECTED Final   Streptococcus agalactiae NOT DETECTED NOT DETECTED Final   Streptococcus pneumoniae NOT DETECTED NOT DETECTED Final   Streptococcus pyogenes NOT DETECTED NOT DETECTED Final   A.calcoaceticus-baumannii NOT DETECTED NOT DETECTED  Final   Bacteroides fragilis NOT DETECTED NOT DETECTED Final   Enterobacterales NOT DETECTED NOT DETECTED Final   Enterobacter cloacae complex NOT DETECTED NOT DETECTED Final   Escherichia coli NOT DETECTED NOT DETECTED Final   Klebsiella aerogenes NOT DETECTED NOT DETECTED Final   Klebsiella oxytoca NOT DETECTED NOT DETECTED Final   Klebsiella pneumoniae NOT DETECTED NOT DETECTED Final   Proteus species NOT DETECTED NOT DETECTED Final   Salmonella species NOT DETECTED NOT DETECTED Final   Serratia marcescens NOT DETECTED NOT DETECTED Final   Haemophilus influenzae NOT DETECTED NOT DETECTED Final   Neisseria meningitidis NOT DETECTED NOT DETECTED Final   Pseudomonas aeruginosa NOT DETECTED NOT DETECTED Final   Stenotrophomonas maltophilia NOT DETECTED NOT DETECTED Final   Candida albicans NOT DETECTED NOT DETECTED Final   Candida auris NOT DETECTED NOT DETECTED Final   Candida glabrata NOT DETECTED NOT DETECTED Final   Candida krusei NOT DETECTED NOT DETECTED Final   Candida parapsilosis NOT DETECTED NOT DETECTED Final   Candida tropicalis NOT DETECTED NOT DETECTED Final   Cryptococcus neoformans/gattii NOT DETECTED NOT DETECTED Final   Meth resistant mecA/C and MREJ NOT DETECTED NOT DETECTED Final    Comment: Performed at [facility], [address], [place], [place] [address]  Resp Panel by RT-PCR (Flu A&B, Covid) Nasopharyngeal Swab     Status: None   Collection Time: 03/08/21  3:00 PM   Specimen: Nasopharyngeal Swab; Nasopharyngeal(NP) swabs in vial transport medium  Result Value Ref Range Status   SARS Coronavirus 2 by RT PCR NEGATIVE NEGATIVE Final    Comment: (NOTE) SARS-CoV-2 target nucleic acids are NOT DETECTED.  The SARS-CoV-2 RNA is generally detectable in upper respiratory specimens during the acute phase of infection. The lowest concentration of SARS-CoV-2 viral copies this assay can detect is 138 copies/mL. A negative result does not preclude  SARS-Cov-2 infection and should not be used as the sole basis for treatment or other patient management decisions. A negative result may occur with  improper specimen collection/handling, submission of specimen other than nasopharyngeal swab, presence of viral mutation(s) within the areas targeted by this assay, and inadequate number of viral copies(<138 copies/mL). A negative result must be combined with clinical observations, patient history, and epidemiological information. The expected result is Negative.  Fact Sheet for Patients:  EntrepreneurPulse.com.au  Fact Sheet for Healthcare Providers:  IncredibleEmployment.be  This test is no t yet approved or cleared by the [place] FDA and  has been authorized for detection and/or diagnosis of SARS-CoV-2 by FDA under an  Emergency Use Authorization (EUA). This EUA will remain  in effect (meaning this test can be used) for the duration of the COVID-19 declaration under Section 564(b)(1) of the Act, 21 U.S.C.section 360bbb-3(b)(1), unless the authorization is terminated  or revoked sooner.       Influenza A by PCR NEGATIVE NEGATIVE Final   Influenza B by PCR NEGATIVE NEGATIVE Final    Comment: (NOTE) The Xpert Xpress SARS-CoV-2/FLU/RSV plus assay is intended as an aid in the diagnosis of influenza from Nasopharyngeal swab specimens and should not be used as a sole basis for treatment. Nasal washings and aspirates are unacceptable for Xpert Xpress SARS-CoV-2/FLU/RSV testing.  Fact Sheet for Patients: EntrepreneurPulse.com.au  Fact Sheet for Healthcare Providers: IncredibleEmployment.be  This test is not yet approved or cleared by the [place] FDA and has been authorized for detection and/or diagnosis of SARS-CoV-2 by FDA under an Emergency Use Authorization (EUA). This EUA will remain in effect (meaning this test can be used) for the duration of  the COVID-19 declaration under Section 564(b)(1) of the Act, 21 U.S.C. section 360bbb-3(b)(1), unless the authorization is terminated or revoked.  Performed at [facility], [place] [address], [place], [place] [address]   Blood Culture (routine x 2)     Status: Abnormal   Collection Time: 03/08/21  3:00 PM   Specimen: BLOOD  Result Value Ref Range Status   Specimen Description   Final    BLOOD RIGHT ANTECUBITAL Performed at [place] [address], [place], [place] [address]    Special Requests   Final    BOTTLES DRAWN AEROBIC AND ANAEROBIC Blood Culture adequate volume Performed at [facility] [address], [place], [place] [address]    Culture  Setup Time   Final    GRAM POSITIVE COCCI IN BOTH AEROBIC AND ANAEROBIC BOTTLES CRITICAL VALUE NOTED.  VALUE IS CONSISTENT WITH PREVIOUSLY REPORTED AND CALLED VALUE.    Culture (A)  Final    STAPHYLOCOCCUS AUREUS SUSCEPTIBILITIES PERFORMED ON PREVIOUS CULTURE WITHIN THE LAST 5 DAYS. Performed at [facility], [place] [address], [place], [place] [address]    Report Status 03/11/2021 FINAL  Final  Urine Culture     Status: None   Collection Time: 03/08/21  3:41 PM   Specimen: In/Out Cath Urine  Result Value Ref Range Status   Specimen Description   Final    IN/OUT CATH URINE Performed at [facility] [address], [place], [place] [address]    Special Requests   Final    NONE Performed at [facility], [place] [address] [place], [place] [address]    Culture   Final    NO GROWTH Performed at [facility], [place] [address], [place], [place] [address]    Report Status 03/09/2021 FINAL  Final  Culture, blood (routine x 2)     Status: None   Collection Time: 03/10/21  3:48 AM   Specimen: BLOOD LEFT HAND  Result Value Ref Range Status   Specimen Description   Final    BLOOD LEFT HAND Performed at [place] [address], [place], [place] [address]    Special Requests   Final    BOTTLES DRAWN AEROBIC AND ANAEROBIC Blood Culture adequate volume Performed at [facility] [address], [place],  [address]    Culture   Final    NO GROWTH 5 DAYS Performed at [facility], [place] [address], [place], [place] [address]    Report Status 03/15/2021 FINAL  Final  Culture, blood (routine x 2)     Status: None   Collection Time: 03/10/21  3:55 AM   Specimen: BLOOD LEFT FOREARM  Result Value Ref Range Status   Specimen Description   Final    BLOOD LEFT FOREARM Performed at [facility], [place] [address], [place], [place] [address]    Special Requests   Final    BOTTLES DRAWN AEROBIC ONLY Blood Culture results may not be optimal due to an inadequate volume of blood received in culture bottles Performed at [facility] [place] [address] [place], [place] [address]    Culture   Final    NO GROWTH 5 DAYS Performed at [facility], [place] [address] [place], [place] [address]    Report Status 03/15/2021 FINAL  Final  Culture, blood (routine x 2)     Status: None   Collection Time: 03/11/21  3:52 AM   Specimen: BLOOD LEFT HAND  Result Value Ref Range Status   Specimen Description   Final    BLOOD LEFT HAND Performed at [facility] [address], [place], [place] [address]    Special Requests   Final    BOTTLES DRAWN AEROBIC ONLY Blood Culture adequate volume Performed at [place] [address] [place], [place] [address]    Culture   Final    NO GROWTH 5 DAYS Performed at [facility], [place] [address] [place], [place] [address]    Report Status 03/16/2021 FINAL  Final     Serology:   Imaging: If present, new imagings (plain films, ct scans, and mri) have been personally visualized and interpreted; radiology reports have been reviewed. Decision making incorporated  into the Impression / Recommendations.  1/28 MRI LUMBAR AND THORACIC SPINE wwo contrast 1. Abnormal epidural enhancement throughout the lumbar spine with cranial extension into the midthoracic spine consistent with infection. Suspected associated loculated subdural fluid collections in the lumbar spine as well as leptomeningeal/subarachnoid space infection in the lumbar and thoracic spine. 2. Abscesses involving the right-sided lumbar posterior paraspinal musculature and left psoas muscle. 3. Potential septic arthritis of multiple lumbar facet joints, most suspiciously right L3-4 and L4-5. 4. Septic versus degenerative arthritis of the left T1-2 facet joint. 5. Innumerable small nonenhancing/cystic foci throughout both kidneys. Renal septic emboli not excluded. 6. Marked distention of the bladder with moderate bilateral hydronephrosis.  1/23 left shoulder xray No acute osseous abnormality identified.  1/23 tte  1. Left ventricular ejection fraction, by estimation, is 60 to 65%. The  left ventricle has normal function. The left ventricle has no regional  wall motion abnormalities. There is mild left ventricular hypertrophy.  Left ventricular diastolic parameters  were normal.   2. Right ventricular systolic function is normal. The right ventricular  size is normal.   3. The mitral valve is normal in structure. Trivial mitral valve  regurgitation. No evidence of mitral stenosis.   4. The aortic valve is normal in structure. Aortic valve regurgitation is  not visualized. No aortic stenosis is present.   5. The inferior vena cava is normal in size with greater than 50%  respiratory variability, suggesting right atrial pressure of 3 mmHg.    1/22 ct chest 1. Trace bilateral pleural effusions. Mild bilateral lower lobe bronchial wall thickening which could be due to inflammatory process. Partial consolidations in  the lower lobes could reflect atelectasis or mild pneumonia. 2.  Possible 7 mm left apical lung nodule. Non-contrast chest CT at 6-12 months is recommended. If the nodule is stable at time of repeat CT, then future CT at 18-24 months (from today's scan) is considered optional for low-risk patients, but is recommended for high-risk patients  1/22 ct cspine 1. No CT evidence for acute intracranial abnormality.  Atrophy. 2. Anterolisthesis C3 on C4 probably due to degenerative change. No definitive fracture is seen. Advanced degenerative changes at multiple levels in the cervical spine.   Jabier Mutton, [facility] [place] [phone] pager    03/17/2021, 2:14 PM

## 2021-03-17 NOTE — Progress Notes (Signed)
°  NEUROSURGERY PROGRESS NOTE   I was called by the hospitalist service after MRI T/L spine w/w/o contrast was obtained. I have reviewed the imaging personally and discussed the case with neuroradiology. There appears to be widespread enhancement of the lower thoracic and lumbar regions involving the dura, as well as the surface of the conus and cauda equina nerve roots. There also appears to be some subdural collections in the lumbar spine. I do not see any frank epidural abscess in these regions. Although the appearance is somewhat unusual for infectious process, in the absence of a compressive epidural abscess I do not think there is any role for open surgical intervention.  Given the enhancement of the leptomeninges, it suggests this infection has spread into the CNS and could consider dosing antibiotics for CNS infection if not already done.  Consuella Lose, MD [facility]

## 2021-03-17 NOTE — Assessment & Plan Note (Addendum)
Appreciate infectious disease and neurosurgery assistance.  For now on IV Ancef.  Follow CRP.  Patient declines TEE however management would not change at this point  - needs repeat MRI spine this week (to include C/T/L this time). Might need to be under anesthesia again. Her affect is too odd still

## 2021-03-17 NOTE — Plan of Care (Signed)
  Problem: Coping: Goal: Level of anxiety will decrease Outcome: Progressing   Problem: Elimination: Goal: Will not experience complications related to bowel motility Outcome: Progressing   Problem: Pain Managment: Goal: General experience of comfort will improve Outcome: Progressing   

## 2021-03-17 NOTE — Progress Notes (Addendum)
Occupational Therapy Treatment Patient Details Name: [name] [name] MRN: [id number] DOB: 06/20/1945 Today's Date: 03/17/2021   History of present illness 76 yo female presents to ED on 1/22 with multiple falls, min confusion, hypoxia; workup for sepsis, ARF. CT chest showed bilateral pleural effusions, bilateral lower lobe bronchial wall thickening with consolidations concerning for possible pneumonia, possible 7 mm left apical lung nodule.  recent outpatient Kenalog injection in the left deltoid on 03/06/2021. PMH anxiety, dCHF, HLD, hypothyroidism, IBS, mood disorder, prediabetes, CKD 3b.   OT comments  Patient was educated on adaptive cup and foam handles to improve ability to engage in self feeding tasks. Patient demonstrated ability to pick up bilateral handled cup with RUE and bring to mouth with no spillage noted. Patient reported liking cup.Patient was also educated on using red foam on utensils with patient reporting increased comfort with use. Nurse tech was educated on carryover of compensatory strategies. Nurse tech verbalized understanding. Session limited today with patients Rabbi stopping in to visit. Patient would continue to benefit from skilled OT services at this time while admitted and after d/c to address noted deficits in order to improve overall safety and independence in ADLs.      Recommendations for follow up therapy are one component of a multi-disciplinary discharge planning process, led by the attending physician.  Recommendations may be updated based on patient status, additional functional criteria and insurance authorization.    Follow Up Recommendations  Skilled nursing-short term rehab (<3 hours/day)    Assistance Recommended at Discharge Frequent or constant Supervision/Assistance  Patient can return home with the following  Two people to help with walking and/or transfers;Two people to help with bathing/dressing/bathroom;Assistance with  cooking/housework;Assistance with feeding;Direct supervision/assist for medications management;Direct supervision/assist for financial management;Assist for transportation;Help with stairs or ramp for entrance   Equipment Recommendations  Other (comment) (defer to next venue)    Recommendations for Other Services      Precautions / Restrictions Precautions Precautions: Fall Restrictions Weight Bearing Restrictions: No       Mobility Bed Mobility                    Transfers                         Balance                                           ADL either performed or assessed with clinical judgement   ADL Overall ADL's : Needs assistance/impaired Eating/Feeding: Set up;Bed level;Cueing for compensatory techinques;With adaptive utensils Eating/Feeding Details (indicate cue type and reason): patient reported having difficulty with self feeding tasks on this date especially holding foam cups. patient was provided with bilateral handled cup to better be able to participate in self feeding tasks. patient was able to tpick up cup with RUE and bring to mouth with no spillage. patient reported liking cup. patient was also trialed using red foam handles to hold utensils easier. patient was able to scoop ice out of cup and bring to mouth with no spillage noted. nurse tech was educated on carryover of compensatory strategies on this date. nurse tech verbalized unerstanding.  Extremity/Trunk Assessment              Vision       Perception     Praxis      Cognition Arousal/Alertness: Awake/alert Behavior During Therapy: WFL for tasks assessed/performed Overall Cognitive Status: No family/caregiver present to determine baseline cognitive functioning                                          Exercises      Shoulder Instructions       General Comments       Pertinent Vitals/ Pain       Pain Assessment Pain Assessment: Faces Faces Pain Scale: Hurts little more Pain Location: neck, back Pain Descriptors / Indicators: Discomfort, Sore, Aching Pain Intervention(s): Limited activity within patient's tolerance, Monitored during session  Home Living                                          Prior Functioning/Environment              Frequency  Min 2X/week        Progress Toward Goals  OT Goals(current goals can now be found in the care plan section)  Progress towards OT goals: Progressing toward goals     Plan Discharge plan remains appropriate    Co-evaluation                 AM-PAC OT "6 Clicks" Daily Activity     Outcome Measure   Help from another person eating meals?: A Little Help from another person taking care of personal grooming?: A Little Help from another person toileting, which includes using toliet, bedpan, or urinal?: Total Help from another person bathing (including washing, rinsing, drying)?: A Lot Help from another person to put on and taking off regular upper body clothing?: A Lot Help from another person to put on and taking off regular lower body clothing?: Total 6 Click Score: 12    End of Session    OT Visit Diagnosis: Unsteadiness on feet (R26.81);Other abnormalities of gait and mobility (R26.89);Muscle weakness (generalized) (M62.81);Other symptoms and signs involving cognitive function   Activity Tolerance Patient limited by fatigue   Patient Left in bed;with call bell/phone within reach;with bed alarm set   Nurse Communication Other (comment) (ok to participate in therapy)        Time: 1610-9604 OT Time Calculation (min): 16 min  Charges: OT General Charges $OT Visit: 1 Visit OT Treatments $Self Care/Home Management : 8-22 mins  Jackelyn Poling OTR/L, MS Acute Rehabilitation Department Office# [phone] Pager# [phone]   Marcellina Millin 03/17/2021,  2:53 PM

## 2021-03-18 DIAGNOSIS — M4620 Osteomyelitis of vertebra, site unspecified: Secondary | ICD-10-CM | POA: Diagnosis not present

## 2021-03-18 DIAGNOSIS — R7881 Bacteremia: Secondary | ICD-10-CM | POA: Diagnosis not present

## 2021-03-18 DIAGNOSIS — L0291 Cutaneous abscess, unspecified: Secondary | ICD-10-CM | POA: Diagnosis not present

## 2021-03-18 LAB — CBC
HCT: 30.1 % — ABNORMAL LOW (ref 36.0–46.0)
Hemoglobin: 9.5 g/dL — ABNORMAL LOW (ref 12.0–15.0)
MCH: 32.2 pg (ref 26.0–34.0)
MCHC: 31.6 g/dL (ref 30.0–36.0)
MCV: 102 fL — ABNORMAL HIGH (ref 80.0–100.0)
Platelets: 305 10*3/uL (ref 150–400)
RBC: 2.95 MIL/uL — ABNORMAL LOW (ref 3.87–5.11)
RDW: 14.2 % (ref 11.5–15.5)
WBC: 17.3 10*3/uL — ABNORMAL HIGH (ref 4.0–10.5)
nRBC: 0 % (ref 0.0–0.2)

## 2021-03-18 LAB — BASIC METABOLIC PANEL
Anion gap: 10 (ref 5–15)
BUN: 43 mg/dL — ABNORMAL HIGH (ref 8–23)
CO2: 21 mmol/L — ABNORMAL LOW (ref 22–32)
Calcium: 9.5 mg/dL (ref 8.9–10.3)
Chloride: 112 mmol/L — ABNORMAL HIGH (ref 98–111)
Creatinine, Ser: 1.58 mg/dL — ABNORMAL HIGH (ref 0.44–1.00)
GFR, Estimated: 34 mL/min — ABNORMAL LOW (ref >60)
Glucose, Bld: 136 mg/dL — ABNORMAL HIGH (ref 70–99)
Potassium: 3.9 mmol/L (ref 3.5–5.1)
Sodium: 143 mmol/L (ref 135–145)

## 2021-03-18 LAB — PROCALCITONIN: Procalcitonin: 0.81 ng/mL

## 2021-03-18 LAB — GLUCOSE, CAPILLARY
Glucose-Capillary: 126 mg/dL — ABNORMAL HIGH (ref 70–99)
Glucose-Capillary: 175 mg/dL — ABNORMAL HIGH (ref 70–99)
Glucose-Capillary: 132 mg/dL — ABNORMAL HIGH (ref 70–99)
Glucose-Capillary: 167 mg/dL — ABNORMAL HIGH (ref 70–99)

## 2021-03-18 LAB — C-REACTIVE PROTEIN: CRP: 23 mg/dL — ABNORMAL HIGH (ref <1.0)

## 2021-03-18 MED ORDER — LIP MEDEX EX OINT
1.0000 "application " | TOPICAL_OINTMENT | CUTANEOUS | Status: DC | PRN
  Administered 2021-03-18: 1 via TOPICAL
  Filled 2021-03-18: qty 7

## 2021-03-18 NOTE — Plan of Care (Signed)
°  Problem: Health Behavior/Discharge Planning: Goal: Ability to manage health-related needs will improve Outcome: Progressing   Problem: Clinical Measurements: Goal: Ability to maintain clinical measurements within normal limits will improve Outcome: Progressing   Problem: Clinical Measurements: Goal: Ability to maintain clinical measurements within normal limits will improve Outcome: Progressing Goal: Will remain free from infection Outcome: Progressing   Problem: Nutrition: Goal: Adequate nutrition will be maintained Outcome: Progressing

## 2021-03-18 NOTE — Progress Notes (Signed)
Progress Note    Phoebie Shad   GXQ:[id number]  DOB: 1945/11/13  DOA: 03/08/2021     10 PCP: Janie Morning, DO  Initial CC: back pain, fall  Hospital Course: Ms. Meenach is a 76 year old female with PMHhypothyroidism, diastolic CHF, stage IIIb chronic kidney disease and mood disorder who presented to the ER on 03/08/2021 after multiple falls at home.  Although poor historian, patient is noted to have slow decline.  In the emergency room, found to be hypoxic and septic from pneumonia.  Following admission, blood cultures positive for MSSA.   MRI noted widespread enhancement of the lower thoracic and lumbar regions involving the dura as well as surface of conus and cauda equina nerve roots with some subdural collections in the lumbar spine.  Discussed case with neurosurgery and after reviewing studies, they do not see any findings for frank epidural abscess in these regions, and therefore no role for open surgical intervention.  Given enhancement of leptomeninges, neurosurgery recommended infection has spread into CNS and dosing antibiotics for CNS infection recommended.  Plan by infectious disease is to continue on Ancef checking CRP every 72 hours and change antibiotics to nafcillin if CRP worsens or mentation worsens.  Interval History:  No events overnight.  Resting in bed comfortably this morning getting changed by staff as well.  Left shoulder pain has improved since the last time I met her as well as pain and swelling in her right upper extremity.  Her mentation seemed also improved.  Assessment and Plan: MSSA bacteremia- (present on admission) - 4/4 bottles positive on admission (1/22) with MSSA. Differential for etiology at this time includes recent outpt steroid injection on 1/20 to Left deltoid vs vertebral infection (worsening lower back pain) vs spontaneous bacteremia vs translocation from pulmonary source - abx changed to Unasyn which should cover presumed sources for now -  obtain MRI left shoulder. ID has ordered MRI T/L spine.  Unfortunately, patient too agitated to stay still, so trying to reorder with general anesthesia.   - TTE on 1/23: some poor images but no obvious vegetations (EF 60-65%, mild LVH).   Blood cultures from 1/24, 1/25 with no growth to date  Sepsis [facility])- (present on admission) - Febrile, tachycardia, tachypnea, leukocytosis; presumed lung source given opacities on CT chest with possible aspiration however now having worsening left shoulder pain (s/p L deltoid kenalog injection on 03/06/21) and ongoing low back pain.  White count has been steadily improving for several days. - Given vancomycin, cefepime, Flagyl in the ER - No significant risk factors for MDRO, then changed to CTX/azithro after blood cultures grew MSSA>> changed to Unasyn, and now ultimately is on Ancef - continue q72 hour CRP check; if worsening CRP or mentation then possibly will be changed to nafcillin for 2 weeks.  ID following, greatly appreciate assistance  Acute renal failure superimposed on stage 3b chronic kidney disease ([place])- (present on admission) - patient has history of CKD3b. Baseline creat ~ 1.3, eGFR 38 -Creatinine on admission at 2.47 and with fluids, had improved, down to 1.45 on 1/29.  Since then, mild increase in today at 1.57.  Questioning if this could be some acute heart failure and cardiorenal disease.  We will stop fluids and give Lasix and follow accordingly. - bilateral hydronephrosis noted on MRI.  Patient has a pure wick catheter and is able to urinate.  Will check in and out catheterization and see if this helps.  If renal function worsens, consider renal ultrasound and indwelling Foley  catheter.  Acute respiratory failure with hypoxia (HCC)-resolved as of 03/17/2021, (present on admission) - Presumed due to underlying pneumonia/aspiration -Patient doing well.  Down to 2 L nasal cannula to 100%.  Attempting to wean off oxygen altogether - See sepsis  and bacteremia work-up otherwise - remains on RA  Hyponatremia-resolved as of 03/09/2021, (present on admission) - presumed hypovolemic hyponatremia.  Resolved with IV fluids  Fall at home, initial encounter - Patient found on floor by EMS on their arrival.  Patient states floor is carpeted although she cannot provide any further collateral information regarding her fall or length of time on the floor - Given renal failure on admission which is still likely prerenal, check CK - Multiple imaging studies on admission including right wrist x-ray, CT head, CT cervical spine, and CT chest: All with no acute abnormalities but does have 4 mm anterolisthesis C3 on C4 - continue pain control - will consult PT once more stable   DMII (diabetes mellitus, type 2) (HCC) - History of prediabetes, no A1c on file - A1c on admission 6.6% - Treat with sliding scale for now and diet control   Chronic diastolic heart failure ([place])- (present on admission) - No S/S exacerbation on exam.  Patient is volume depleted clinically Because of need for D5W, has been getting fluids at 125 cc an hour.  BNP elevated given additional fluids.  Repeat BNP on 1/31 normal.  At this point now, we will stop fluids and start Lasix and follow renal function appropriately. -Last echo reviewed from 11/06/2020: Moderate MR, impaired relaxation, EF 65% - TTE repeated on 1/23, EF 60-65%, mild LVH; trivial MR on read; normal diastology   Vertebral osteomyelitis ([place])- (present on admission) Appreciate infectious disease and neurosurgery assistance.  For now on IV Ancef.  Follow CRP and procalcitonin.  Patient declines TEE however management would not change at this point   [place], oral Started on fluconazole.  Slowly improving. - complete 14 day course  Morbid obesity ([place])- (present on admission) Meets criteria for BMI greater than 35+ comorbidities of heart failure and diabetes and hypertension  Hypothyroidism- (present on  admission) - TSH, 0.429 - continue Synthroid  Normocytic anemia- (present on admission) - No complaints or signs of bleeding.  No recent lab work other than July 2021 (12.3 g/dL at that time).  Hemoglobin stable since admission.  Elevated LFTs- (present on admission) Suspect secondary to shock liver from sepsis.  Transaminases slowly improving  Hypernatremia-resolved as of 03/14/2021 Due to poor p.o. intake from altered mentation.  Have changed IV fluids to half-normal saline.  Started on D5W after half-normal saline not improving sodium.  Peaking at 157, sodium has since improved with D5W.  Acute metabolic encephalopathy-resolved as of 03/17/2021 Patient more awake now.  Some of this may have been due to initial sepsis, uremia and hypernatremia.  Appears to have resolved.  Patient now is alert and oriented x3.     Old records reviewed in assessment of this patient  Antimicrobials: Vanc, cefepime 1/22 x 1 Rocephin 1/22 >> 1/23 Flagyl 1/22 >> 1/23 Unasyn 1/23 >> 1/24 Ancef 1/24 >> current  DVT prophylaxis: HSQ  Code Status:   Code Status: Full Code  Disposition Plan:  Rehab Status is: Inpt  Objective: Blood pressure (!) 128/55, pulse (!) 105, temperature 97.9 F (36.6 C), temperature source Oral, resp. rate (!) 21, height 5' 2"  (1.575 m), weight 85.5 kg, SpO2 96 %.  Examination:  Physical Exam Constitutional:      Comments:  More awake, alert, NAD  HENT:     Head: Normocephalic.     Comments: Bruising noted along right forehead    Mouth/Throat:     Mouth: Mucous membranes are moist.     Comments: Grossly dry mucous membranes noted in oral mucosa Eyes:     Extraocular Movements: Extraocular movements intact.  Cardiovascular:     Rate and Rhythm: Normal rate and regular rhythm.     Comments: Subtle 2/6 HSM noted in precordium Pulmonary:     Effort: Pulmonary effort is normal.     Breath sounds: Normal breath sounds.  Abdominal:     General: Bowel sounds are normal.  There is no distension.     Palpations: Abdomen is soft.     Tenderness: There is no abdominal tenderness.  Musculoskeletal:     Cervical back: Normal range of motion and neck supple.     Comments: Improved ROM in bilateral upper extremities in all joints  Skin:    General: Skin is warm and dry.  Neurological:     General: No focal deficit present.     Comments: Some slight confusion still appreciated but overall significantly improved  Psychiatric:        Mood and Affect: Mood normal.     Consultants:  ID  Procedures:    Data Reviewed: I have Reviewed nursing notes, Vitals, and Lab results since pt's last encounter. Pertinent lab results creat 1.58, WBC 17.3, Hgb 9.5, CRP 23    LOS: 10 days   Dwyane Dee, MD [facility] 03/18/2021, 4:49 PM

## 2021-03-18 NOTE — Care Management Important Message (Signed)
Important Message  Patient Details IM Letter given to the Patient. Name: [name] [name] MRN: [id number] Date of Birth: 1945/10/27   Medicare Important Message Given:  Yes     Kerin Salen 03/18/2021, 11:15 AM

## 2021-03-18 NOTE — Progress Notes (Signed)
PT Cancellation Note  Patient Details Name: [name] [name] MRN: [id number] DOB: November 07, 1945   Cancelled Treatment:    Reason Eval/Treat Not Completed: Other (comment)just set up to eat. Will check back another time. [place] Pager [phone] Office [phone]     [name] 03/18/2021, 2:54 PM

## 2021-03-18 NOTE — Progress Notes (Signed)
[facility]  Date of Admission:  03/08/2021     Abx: 1/24-c cefazolin 1/26-c fluconazole  1/22-24 vanc/cefepime/flagyl --> amp/sulb  ASSESSMENT: Mssa bacteremia community acquired Epidural abscess/phlegmon -- thoracolumbar vertebral om Left psoas abscess Right paralumbar muscle abscess Severe sepsis thrush Back pain Metabolic encephalopathy resolved AKI on Ckd3  75 yo female admitted 1/23 after slidding off her bed unable to get up, found to have severe sepsis/mssa bacteremia and metabolic encephalopathy  0/25 and 1/25 repeat bcx negative 1/22 bcx 2 of 2 set mssa (S tetra, bactrim)  Mentating much better on cefazolin Initial dysphagia in setting metabolic encephalopathy cleared by speech for oral intake  Initial left shoulder pain no longer present -- exam 1/30 without shoulder joint tenderness or decreased passive rom Nonspecific mid/upper back pain awaiting read on thoracic and lumbar mri from 1/28 extensive involvement epidural phlegmon/dural thickening thoracolumbar, along with left psoas and right paraspinal abscess. Mention of T1-2 facet changes --> query higher involvement but patient refused mri cspine Tte no obvious vegetation. Patient currently refused. No change in management at this time and not absolutelly need  On thrush treatment -- resolving; ?hospital onset. Hiv negative. Not on any steroid inhaler previously and doesn't appear to be severely malnutritious and no recent abx outside of what is being given here  -------- 2/01 assessment Continued clinical stability/improvement No LE deficit or bowel/bladder control issue   As previously discussed, typically nafcillin or oxacillin is used for intracranial involvement or high innoculum not responding to cefazolin. It has high incidence of AIN, transaminitis, and allergy response than cefazolin otherwise so would avoid unless absolute indication.   Crp: 2/01     23   Will closely follow and trend crp. A repeat mri would be needed in 1-2 weeks   PLAN: Continue cefazolin Continue to trend crp If worsening crp or mentation or mri finding consider changing to nafcillin for 2 weeks then switch back to cefazolin Previous id assessment concerned for esophageal involvement with thrush so will do full 14 days to finish on 2/09 with fluconazole Discussed with primary team   I spent more than 35 minute reviewing data/chart, and coordinating care and >50% direct face to face time providing counseling/discussing diagnostics/treatment plan with patient   Active Problems:   Chronic diastolic heart failure ([place])   Sepsis ([place])   Acute renal failure superimposed on stage 3b chronic kidney disease ([place])   Elevated LFTs   DMII (diabetes mellitus, type 2) (HCC)   Normocytic anemia   Fall at home, initial encounter   Hypothyroidism   MSSA bacteremia   Morbid obesity ([place])   Thrush, oral   Vertebral osteomyelitis ([place])   No Known Allergies  Scheduled Meds:  carbamazepine  200 mg Oral QHS   chlorhexidine  15 mL Mouth Rinse BID   fluconazole  200 mg Oral Daily   furosemide  20 mg Intravenous BID   heparin  5,000 Units Subcutaneous Q8H   insulin aspart  0-5 Units Subcutaneous QHS   insulin aspart  0-9 Units Subcutaneous TID WC   levothyroxine  88 mcg Oral Q0600   mouth rinse  15 mL Mouth Rinse q12n4p   sodium chloride flush  3 mL Intravenous Q12H   Continuous Infusions:   ceFAZolin (ANCEF) IV 2 g (03/18/21 1420)   PRN Meds:.acetaminophen **OR** acetaminophen, bisacodyl, hydrALAZINE, labetalol, naphazoline-glycerin, ondansetron (ZOFRAN) IV   SUBJECTIVE: Appetite intact but doesn't want to eat, however aksing for help  for someone to help her eat No f/c Crp high No n/v/diarrhea No bowel/bladder control issue No numbness/focal weakness in LE   Review of Systems: ROS All other ROS was negative, except mentioned  above     OBJECTIVE: Vitals:   03/18/21 1111 03/18/21 1231 03/18/21 1300 03/18/21 1400  BP: (!) 128/55     Pulse: (!) 105     Resp: (!) 22 (!) 30 (!) 28 (!) 21  Temp: 97.9 F (36.6 C)     TempSrc: Oral     SpO2: 96%     Weight:      Height:  5\' 2"  (1.575 m)     Body mass index is 34.48 kg/m.  Physical Exam  General/constitutional: no distress, pleasant HEENT: Normocephalic, PER, Conj Clear, EOMI, Oropharynx clear Neck supple CV: rrr no mrg Lungs: clear to auscultation, normal respiratory effort Abd: Soft, Nontender Ext: no edema Skin: No Rash Neuro: generalized weakness; no clonus MSK: no peripheral joint swelling/tenderness/warmth  Lab Results Lab Results  Component Value Date   WBC 17.3 (H) 03/18/2021   HGB 9.5 (L) 03/18/2021   HCT 30.1 (L) 03/18/2021   MCV 102.0 (H) 03/18/2021   PLT 305 03/18/2021    Lab Results  Component Value Date   CREATININE 1.58 (H) 03/18/2021   BUN 43 (H) 03/18/2021   NA 143 03/18/2021   K 3.9 03/18/2021   CL 112 (H) 03/18/2021   CO2 21 (L) 03/18/2021    Lab Results  Component Value Date   ALT 14 03/14/2021   AST 44 (H) 03/14/2021   ALKPHOS 76 03/14/2021   BILITOT 0.5 03/14/2021      Microbiology: Recent Results (from the past 240 hour(s))  Culture, blood (routine x 2)     Status: None   Collection Time: 03/10/21  3:48 AM   Specimen: BLOOD LEFT HAND  Result Value Ref Range Status   Specimen Description   Final    BLOOD LEFT HAND Performed at [facility], [address], [place], [place] [address]    Special Requests   Final    BOTTLES DRAWN AEROBIC AND ANAEROBIC Blood Culture adequate volume Performed at [facility] [address], [place], [place] [address]    Culture   Final    NO GROWTH 5 DAYS Performed at [facility], [place] [address], [place], [place] [address]    Report Status 03/15/2021 FINAL  Final  Culture, blood (routine x 2)     Status: None   Collection  Time: 03/10/21  3:55 AM   Specimen: BLOOD LEFT FOREARM  Result Value Ref Range Status   Specimen Description   Final    BLOOD LEFT FOREARM Performed at [facility], [place] [address], [place], [place] [address]    Special Requests   Final    BOTTLES DRAWN AEROBIC ONLY Blood Culture results may not be optimal due to an inadequate volume of blood received in culture bottles Performed at [facility] [address], [place], [place] [address]    Culture   Final    NO GROWTH 5 DAYS Performed at [facility], [place] [address], [place],  [address]    Report Status 03/15/2021 FINAL  Final  Culture, blood (routine x 2)     Status: None   Collection Time: 03/11/21  3:52 AM   Specimen: BLOOD LEFT HAND  Result Value Ref Range Status   Specimen Description   Final  BLOOD LEFT HAND Performed at [facility] [address], [place], [place] [address]    Special Requests   Final    BOTTLES DRAWN AEROBIC ONLY Blood Culture adequate volume Performed at [facility] [address], [place], [place] [address]    Culture   Final    NO GROWTH 5 DAYS Performed at [facility], [place] [address] [place], [place] [address]    Report Status 03/16/2021 FINAL  Final     Serology:   Imaging: If present, new imagings (plain films, ct scans, and mri) have been personally visualized and interpreted; radiology reports have been reviewed. Decision making incorporated into the Impression / Recommendations.  1/28 MRI LUMBAR AND THORACIC SPINE wwo contrast 1. Abnormal epidural enhancement throughout the lumbar spine with cranial extension into the midthoracic spine consistent with infection. Suspected associated loculated subdural fluid collections in the lumbar spine as well as leptomeningeal/subarachnoid space infection in the lumbar and thoracic spine. 2. Abscesses involving the right-sided lumbar posterior  paraspinal musculature and left psoas muscle. 3. Potential septic arthritis of multiple lumbar facet joints, most suspiciously right L3-4 and L4-5. 4. Septic versus degenerative arthritis of the left T1-2 facet joint. 5. Innumerable small nonenhancing/cystic foci throughout both kidneys. Renal septic emboli not excluded. 6. Marked distention of the bladder with moderate bilateral hydronephrosis.  1/23 left shoulder xray No acute osseous abnormality identified.  1/23 tte  1. Left ventricular ejection fraction, by estimation, is 60 to 65%. The  left ventricle has normal function. The left ventricle has no regional  wall motion abnormalities. There is mild left ventricular hypertrophy.  Left ventricular diastolic parameters  were normal.   2. Right ventricular systolic function is normal. The right ventricular  size is normal.   3. The mitral valve is normal in structure. Trivial mitral valve  regurgitation. No evidence of mitral stenosis.   4. The aortic valve is normal in structure. Aortic valve regurgitation is  not visualized. No aortic stenosis is present.   5. The inferior vena cava is normal in size with greater than 50%  respiratory variability, suggesting right atrial pressure of 3 mmHg.    1/22 ct chest 1. Trace bilateral pleural effusions. Mild bilateral lower lobe bronchial wall thickening which could be due to inflammatory process. Partial consolidations in the lower lobes could reflect atelectasis or mild pneumonia. 2. Possible 7 mm left apical lung nodule. Non-contrast chest CT at 6-12 months is recommended. If the nodule is stable at time of repeat CT, then future CT at 18-24 months (from today's scan) is considered optional for low-risk patients, but is recommended for high-risk patients  1/22 ct cspine 1. No CT evidence for acute intracranial abnormality.  Atrophy. 2. Anterolisthesis C3 on C4 probably due to degenerative change. No definitive fracture is  seen. Advanced degenerative changes at multiple levels in the cervical spine.   Jabier Mutton, [facility] for Infectious [place] [phone] pager    03/18/2021, 5:18 PM

## 2021-03-19 ENCOUNTER — Inpatient Hospital Stay (HOSPITAL_COMMUNITY): Payer: Medicare Other

## 2021-03-19 DIAGNOSIS — L0291 Cutaneous abscess, unspecified: Secondary | ICD-10-CM | POA: Diagnosis not present

## 2021-03-19 DIAGNOSIS — R338 Other retention of urine: Secondary | ICD-10-CM

## 2021-03-19 DIAGNOSIS — B370 Candidal stomatitis: Secondary | ICD-10-CM

## 2021-03-19 DIAGNOSIS — M4620 Osteomyelitis of vertebra, site unspecified: Secondary | ICD-10-CM | POA: Diagnosis not present

## 2021-03-19 LAB — CBC WITH DIFFERENTIAL/PLATELET
Abs Immature Granulocytes: 0.18 10*3/uL — ABNORMAL HIGH (ref 0.00–0.07)
Basophils Absolute: 0 10*3/uL (ref 0.0–0.1)
Basophils Relative: 0 %
Eosinophils Absolute: 0.1 10*3/uL (ref 0.0–0.5)
Eosinophils Relative: 0 %
HCT: 27.4 % — ABNORMAL LOW (ref 36.0–46.0)
Hemoglobin: 8.6 g/dL — ABNORMAL LOW (ref 12.0–15.0)
Immature Granulocytes: 1 %
Lymphocytes Relative: 8 %
Lymphs Abs: 1 10*3/uL (ref 0.7–4.0)
MCH: 31.3 pg (ref 26.0–34.0)
MCHC: 31.4 g/dL (ref 30.0–36.0)
MCV: 99.6 fL (ref 80.0–100.0)
Monocytes Absolute: 0.7 10*3/uL (ref 0.1–1.0)
Monocytes Relative: 5 %
Neutro Abs: 11.5 10*3/uL — ABNORMAL HIGH (ref 1.7–7.7)
Neutrophils Relative %: 86 %
Platelets: UNDETERMINED 10*3/uL (ref 150–400)
RBC: 2.75 MIL/uL — ABNORMAL LOW (ref 3.87–5.11)
RDW: 14.4 % (ref 11.5–15.5)
WBC: 13.4 10*3/uL — ABNORMAL HIGH (ref 4.0–10.5)
nRBC: 0 % (ref 0.0–0.2)

## 2021-03-19 LAB — GLUCOSE, CAPILLARY
Glucose-Capillary: 131 mg/dL — ABNORMAL HIGH (ref 70–99)
Glucose-Capillary: 238 mg/dL — ABNORMAL HIGH (ref 70–99)
Glucose-Capillary: 209 mg/dL — ABNORMAL HIGH (ref 70–99)
Glucose-Capillary: 155 mg/dL — ABNORMAL HIGH (ref 70–99)

## 2021-03-19 LAB — COMPREHENSIVE METABOLIC PANEL
ALT: 10 U/L (ref 0–44)
AST: 38 U/L (ref 15–41)
Albumin: 2.1 g/dL — ABNORMAL LOW (ref 3.5–5.0)
Alkaline Phosphatase: 63 U/L (ref 38–126)
Anion gap: 9 (ref 5–15)
BUN: 41 mg/dL — ABNORMAL HIGH (ref 8–23)
CO2: 23 mmol/L (ref 22–32)
Calcium: 9.2 mg/dL (ref 8.9–10.3)
Chloride: 109 mmol/L (ref 98–111)
Creatinine, Ser: 1.53 mg/dL — ABNORMAL HIGH (ref 0.44–1.00)
GFR, Estimated: 35 mL/min — ABNORMAL LOW (ref >60)
Glucose, Bld: 134 mg/dL — ABNORMAL HIGH (ref 70–99)
Potassium: 3.6 mmol/L (ref 3.5–5.1)
Sodium: 141 mmol/L (ref 135–145)
Total Bilirubin: 0.5 mg/dL (ref 0.3–1.2)
Total Protein: 6.7 g/dL (ref 6.5–8.1)

## 2021-03-19 LAB — MAGNESIUM: Magnesium: 2 mg/dL (ref 1.7–2.4)

## 2021-03-19 LAB — C-REACTIVE PROTEIN: CRP: 19.6 mg/dL — ABNORMAL HIGH (ref <1.0)

## 2021-03-19 MED ORDER — SENNOSIDES-DOCUSATE SODIUM 8.6-50 MG PO TABS
1.0000 | ORAL_TABLET | Freq: Two times a day (BID) | ORAL | Status: DC
  Administered 2021-03-19 – 2021-04-03 (×24): 1 via ORAL
  Filled 2021-03-19 (×26): qty 1

## 2021-03-19 MED ORDER — CHLORHEXIDINE GLUCONATE CLOTH 2 % EX PADS
6.0000 | MEDICATED_PAD | Freq: Every day | CUTANEOUS | Status: DC
  Administered 2021-03-20 – 2021-03-31 (×12): 6 via TOPICAL

## 2021-03-19 MED ORDER — LACTULOSE 10 GM/15ML PO SOLN
10.0000 g | Freq: Three times a day (TID) | ORAL | Status: DC
  Administered 2021-03-19 – 2021-04-03 (×27): 10 g via ORAL
  Filled 2021-03-19: qty 15
  Filled 2021-03-19 (×18): qty 30
  Filled 2021-03-19 (×2): qty 15
  Filled 2021-03-19 (×8): qty 30
  Filled 2021-03-19: qty 15
  Filled 2021-03-19 (×4): qty 30
  Filled 2021-03-19: qty 15
  Filled 2021-03-19 (×2): qty 30

## 2021-03-19 MED ORDER — POLYETHYLENE GLYCOL 3350 17 G PO PACK
17.0000 g | PACK | Freq: Two times a day (BID) | ORAL | Status: DC
  Administered 2021-03-19 – 2021-04-03 (×18): 17 g via ORAL
  Filled 2021-03-19 (×23): qty 1

## 2021-03-19 NOTE — Progress Notes (Signed)
Pt up in chair by Nursing staff, 2-3 total maximum assistance to position pt in chair; stress incontinence noted during the entire activity. Pt  helped very little even when encouraged and coached. Encouraged pt to feed self to maintain upper body strength. Will continue to monitor. SRP,RN

## 2021-03-19 NOTE — Assessment & Plan Note (Addendum)
-   Differential for etiology includes severe deconditioning as evidenced by requiring 3+ person assist for bed transfers vs spinal cord involvement due to underlying infection - for now deconditioning higher on differential and foley is now in place as of 2/2 - keep foley in place until patient significantly improved physically and mentally as low chance of passing a voiding trial anytime soon - will follow up repeat MRIs next week to re-evaluate spinal cord involvement as well

## 2021-03-19 NOTE — Progress Notes (Signed)
Chaplain tried to engage in an initial visit with [name] [name] or her daughter.  [name] was sleeping this morning when she arrived and Chaplain did not want to wake her.  Chaplain will follow-up.  Chaplain also tried to reach [name]'s daughter by phone to follow-up with Advanced Directive request.  Chaplain left a voicemail.    Chaplain will follow-up.    03/19/21 1000  Clinical Encounter Type  Visited With Patient not available  Visit Type Initial;Social support

## 2021-03-19 NOTE — Progress Notes (Signed)
Progress Note   Patient: [name] [name] IRW:[id number] DOB: [birth year] DOA: 03/08/2021     11 DOS: the patient was seen and examined on 03/19/2021   Brief hospital course: [name] is a 76 year old female with PMHhypothyroidism, diastolic CHF, stage IIIb chronic kidney disease and mood disorder who presented to the ER on 03/08/2021 after multiple falls at home.  Although poor historian, patient is noted to have slow decline.  In the emergency room, found to be hypoxic and septic from pneumonia.  Following admission, blood cultures positive for MSSA.   MRI noted widespread enhancement of the lower thoracic and lumbar regions involving the dura as well as surface of conus and cauda equina nerve roots with some subdural collections in the lumbar spine.  Discussed case with neurosurgery and after reviewing studies, they do not see any findings for frank epidural abscess in these regions, and therefore no role for open surgical intervention.  Given enhancement of leptomeninges, neurosurgery recommended infection has spread into CNS and dosing antibiotics for CNS infection recommended.  Plan by infectious disease is to continue on Ancef checking CRP every 72 hours and change antibiotics to nafcillin if CRP worsens or mentation worsens.  Assessment and Plan: MSSA bacteremia- (present on admission) - 4/4 bottles positive on admission (1/22) with MSSA. Differential for etiology at this time includes recent outpt steroid injection on 1/20 to Left deltoid vs vertebral infection (worsening lower back pain) vs spontaneous bacteremia vs translocation from pulmonary source - abx changed to Unasyn which should cover presumed sources for now - obtain MRI left shoulder. ID has ordered MRI T/L spine.  Unfortunately, patient too agitated to stay still, so trying to reorder with general anesthesia.   - TTE on 1/23: some poor images but no obvious vegetations (EF 60-65%, mild LVH).   Blood cultures from 1/24, 1/25 with  no growth to date  Sepsis [facility])- (present on admission) - Febrile, tachycardia, tachypnea, leukocytosis; presumed lung source given opacities on CT chest with possible aspiration however now having worsening left shoulder pain (s/p L deltoid kenalog injection on 03/06/21) and ongoing low back pain.  White count has been steadily improving for several days. - Given vancomycin, cefepime, Flagyl in the ER - No significant risk factors for MDRO, then changed to CTX/azithro after blood cultures grew MSSA>> changed to Unasyn, and now ultimately is on Ancef - continue q72 hour CRP check; if worsening CRP or mentation then possibly will be changed to nafcillin for 2 weeks.  ID following, greatly appreciate assistance  Acute renal failure superimposed on stage 3b chronic kidney disease ([place])- (present on admission) - patient has history of CKD3b. Baseline creat ~ 1.3, eGFR 38 -Creatinine on admission at 2.47 and with fluids, had improved, down to 1.45 on 1/29.  Since then, mild increase in today at 1.57.  Questioning if this could be some acute heart failure and cardiorenal disease.  We will stop fluids and give Lasix and follow accordingly. - bilateral hydronephrosis noted on MRI.  Patient has a pure wick with ongoing urine output however abdomen remains distended concerning for urine retention and/or constipation - s/p bladder scan on 2/2 with large PVR; foley placed with ~3200 cc immediately out in foley - keep foley in place as she's likely so deconditioned it's going to take time for regaining function  Acute respiratory failure with hypoxia (HCC)-resolved as of 03/17/2021, (present on admission) - Presumed due to underlying pneumonia/aspiration -Patient doing well.  Down to 2 L nasal cannula to 100%.  Attempting to wean off oxygen altogether - See sepsis and bacteremia work-up otherwise - remains on RA  Hyponatremia-resolved as of 03/09/2021, (present on admission) - presumed hypovolemic  hyponatremia.  Resolved with IV fluids  Acute urinary retention - Differential for etiology includes severe deconditioning as evidenced by requiring 3+ person assist for bed transfers vs spinal cord involvement due to underlying infection - for now deconditioning higher on differential and foley is now in place as of 2/2 - will follow up repeat MRIs next week to re-evaluate spinal cord involvement as well   Fall at home, initial encounter - Patient found on floor by EMS on their arrival.  Patient states floor is carpeted although she cannot provide any further collateral information regarding her fall or length of time on the floor - Given renal failure on admission which is still likely prerenal, check CK - Multiple imaging studies on admission including right wrist x-ray, CT head, CT cervical spine, and CT chest: All with no acute abnormalities but does have 4 mm anterolisthesis C3 on C4 - continue pain control - continue PT/OT  DMII (diabetes mellitus, type 2) (HCC) - History of prediabetes, no A1c on file - A1c on admission 6.6% - Treat with sliding scale for now and diet control   Chronic diastolic heart failure ([place])- (present on admission) - No S/S exacerbation on exam.  Patient is volume depleted clinically Because of need for D5W, has been getting fluids at 125 cc an hour.  BNP elevated given additional fluids.  Repeat BNP on 1/31 normal.  At this point now, we will stop fluids and start Lasix and follow renal function appropriately. -Last echo reviewed from 11/06/2020: Moderate MR, impaired relaxation, EF 65% - TTE repeated on 1/23, EF 60-65%, mild LVH; trivial MR on read; normal diastology   Vertebral osteomyelitis ([place])- (present on admission) Appreciate infectious disease and neurosurgery assistance.  For now on IV Ancef.  Follow CRP and procalcitonin.  Patient declines TEE however management would not change at this point   [place], oral Started on fluconazole.  Slowly  improving. - complete 14 day course  Morbid obesity ([place])- (present on admission) Meets criteria for BMI greater than 35+ comorbidities of heart failure and diabetes and hypertension  Hypothyroidism- (present on admission) - TSH, 0.429 - continue Synthroid  Normocytic anemia- (present on admission) - No complaints or signs of bleeding.  No recent lab work other than July 2021 (12.3 g/dL at that time).  Hemoglobin stable since admission.  Elevated LFTs- (present on admission) Suspect secondary to shock liver from sepsis.  Transaminases slowly improving  Hypernatremia-resolved as of 03/14/2021 Due to poor p.o. intake from altered mentation.  Have changed IV fluids to half-normal saline.  Started on D5W after half-normal saline not improving sodium.  Peaking at 157, sodium has since improved with D5W.  Acute metabolic encephalopathy-resolved as of 03/17/2021 Patient more awake now.  Some of this may have been due to initial sepsis, uremia and hypernatremia.  Appears to have resolved.  Patient now is alert and oriented x3.      Subjective: No events overnight.  Sitting in recliner when seen this morning but already complaining of feeling uncomfortable.  This was prior to Foley catheter insertion as her abdomen remained significantly distended.  Physical Exam: Vitals:   03/18/21 2030 03/19/21 0517 03/19/21 0857 03/19/21 1523  BP: (!) 135/56 122/78 134/81 (!) 135/50  Pulse: 99 92 (!) 107 93  Resp: 20 20  18   Temp: 98.3 F (36.8  C) 98.1 F (36.7 C)  97.7 F (36.5 C)  TempSrc: Oral Oral    SpO2: 98% 98% 98% 99%  Weight:      Height:      Physical Exam Constitutional:      Comments: More awake, alert, NAD  HENT:     Head: Normocephalic.     Comments: Bruising noted along right forehead    Mouth/Throat:     Mouth: Mucous membranes are moist.     Comments: Grossly dry mucous membranes noted in oral mucosa Eyes:     Extraocular Movements: Extraocular movements intact.   Cardiovascular:     Rate and Rhythm: Normal rate and regular rhythm.     Comments: Subtle 2/6 HSM noted in precordium Pulmonary:     Effort: Pulmonary effort is normal.     Breath sounds: Normal breath sounds.  Abdominal:     General: Bowel sounds are normal. There is distension.     Tenderness: There is no abdominal tenderness.     Comments: More firm to palpation  Musculoskeletal:     Cervical back: Normal range of motion and neck supple.     Comments: Improved ROM in bilateral upper extremities in all joints  Skin:    General: Skin is warm and dry.  Neurological:     General: No focal deficit present.     Comments: Some slight confusion still appreciated but overall significantly improved  Psychiatric:        Mood and Affect: Mood normal.     Data Reviewed:  I have Reviewed nursing notes, Vitals, and Lab results since pt's last encounter. Pertinent lab results creat 1.52, CRP 19.6  I have ordered test including BMP, CBC, Mg I have reviewed the last note from all staff,  I have discussed pt's care plan and test results with nursing staff, CM.   Family Communication:    Code Status: Full Code  Disposition: Status is: Inpatient Remains inpatient appropriate because: ongoing treatment of osteomyelitis      Planned Discharge Destination: Skilled nursing facility DVT ppx: HSQ     Author: Dwyane Dee, MD 03/19/2021 3:34 PM  For on call review [email or web address].

## 2021-03-19 NOTE — Progress Notes (Signed)
Pt c/o of abd discomfort, pt abd distended and taut. Pt noted to have urinated large volume after getting up to chair this am. Pt continued to c/o of abd discomfort. Bladder scanned, pt noted 999 times 3. MD updated, foley order received. SRP, RN

## 2021-03-19 NOTE — Progress Notes (Signed)
[facility] for Infectious Disease  Date of Admission:  03/08/2021     Abx: 1/24-c cefazolin 1/26-c fluconazole  1/22-24 vanc/cefepime/flagyl --> amp/sulb  ASSESSMENT: Mssa bacteremia community acquired Epidural abscess/phlegmon -- thoracolumbar vertebral om Left psoas abscess Right paralumbar muscle abscess Severe sepsis thrush Back pain Metabolic encephalopathy resolved AKI on Ckd3  75 yo female admitted 1/23 after slidding off her bed unable to get up, found to have severe sepsis/mssa bacteremia and metabolic encephalopathy  6/19 and 1/25 repeat bcx negative 1/22 bcx 2 of 2 set mssa (S tetra, bactrim)  Mentating much better on cefazolin Initial dysphagia in setting metabolic encephalopathy cleared by speech for oral intake  Initial left shoulder pain no longer present -- exam 1/30 without shoulder joint tenderness or decreased passive rom Nonspecific mid/upper back pain awaiting read on thoracic and lumbar mri from 1/28 extensive involvement epidural phlegmon/dural thickening thoracolumbar, along with left psoas and right paraspinal abscess. Mention of T1-2 facet changes --> query higher involvement but patient refused mri cspine Tte no obvious vegetation. Patient currently refused. No change in management at this time and not absolutelly need  On thrush treatment -- resolving; ?hospital onset. Hiv negative. Not on any steroid inhaler previously and doesn't appear to be severely malnutritious and no recent abx outside of what is being given here  -------- 2/02 assessment Crp and cbc showed improvement  Clinically has been improving  Continue to have no LE neurologic deficit otherwise, but albeit with severe deconditioning over all  As previously discussed, typically nafcillin or oxacillin is used for intracranial involvement or high innoculum not responding to cefazolin. It has high incidence of AIN, transaminitis, and allergy response than cefazolin  otherwise so would avoid unless absolute indication.   Crp: 2/02    19.6 2/01    23   PLAN: Continue cefazolin Trend crp If worsening crp or mentation or mri finding consider changing to nafcillin for 2 weeks then switch back to cefazolin Finish 14 days fluconazole on 2/09 Consider repeat mri spine in around 2 weeks Discussed with primary team   I spent more than 35 minute reviewing data/chart, and coordinating care and >50% direct face to face time providing counseling/discussing diagnostics/treatment plan with patient   Active Problems:   Chronic diastolic heart failure (HCC)   Sepsis ([place])   Acute renal failure superimposed on stage 3b chronic kidney disease ([place])   Elevated LFTs   DMII (diabetes mellitus, type 2) ([place])   Normocytic anemia   Fall at home, initial encounter   Hypothyroidism   MSSA bacteremia   Morbid obesity ([place])   Thrush, oral   Vertebral osteomyelitis ([place])   Abscess   No Known Allergies  Scheduled Meds:  carbamazepine  200 mg Oral QHS   chlorhexidine  15 mL Mouth Rinse BID   Chlorhexidine Gluconate Cloth  6 each Topical Daily   fluconazole  200 mg Oral Daily   furosemide  20 mg Intravenous BID   heparin  5,000 Units Subcutaneous Q8H   insulin aspart  0-5 Units Subcutaneous QHS   insulin aspart  0-9 Units Subcutaneous TID WC   lactulose  10 g Oral TID   levothyroxine  88 mcg Oral Q0600   mouth rinse  15 mL Mouth Rinse q12n4p   polyethylene glycol  17 g Oral BID   senna-docusate  1 tablet Oral BID   sodium chloride flush  3 mL Intravenous Q12H   Continuous Infusions:   ceFAZolin (ANCEF)  IV 2 g (03/19/21 0536)   PRN Meds:.acetaminophen **OR** acetaminophen, bisacodyl, hydrALAZINE, labetalol, lip balm, naphazoline-glycerin, ondansetron (ZOFRAN) IV   SUBJECTIVE: No f/c Wbc/crp down No LE neurologic deficit/bowel/bladder incontinence No complaint otherwise Very weak    Review of Systems: ROS All other ROS was negative, except  mentioned above     OBJECTIVE: Vitals:   03/18/21 1732 03/18/21 2030 03/19/21 0517 03/19/21 0857  BP: 121/64 (!) 135/56 122/78 134/81  Pulse: (!) 101 99 92 (!) 107  Resp: 19 20 20    Temp:  98.3 F (36.8 C) 98.1 F (36.7 C)   TempSrc:  Oral Oral   SpO2:  98% 98% 98%  Weight:      Height:       Body mass index is 34.48 kg/m.  Physical Exam  General/constitutional: no distress, pleasant HEENT: Normocephalic, PER, Conj Clear, EOMI, Oropharynx clear Neck supple CV: rrr no mrg Lungs: clear to auscultation, normal respiratory effort Abd: Soft, Nontender Ext: no edema Skin: No Rash Neuro: generalized weakness; no clonus MSK: no peripheral joint swelling/tenderness/warmth  Lab Results Lab Results  Component Value Date   WBC 13.4 (H) 03/19/2021   HGB 8.6 (L) 03/19/2021   HCT 27.4 (L) 03/19/2021   MCV 99.6 03/19/2021   PLT PLATELET CLUMPS NOTED ON SMEAR, UNABLE TO ESTIMATE 03/19/2021    Lab Results  Component Value Date   CREATININE 1.53 (H) 03/19/2021   BUN 41 (H) 03/19/2021   NA 141 03/19/2021   K 3.6 03/19/2021   CL 109 03/19/2021   CO2 23 03/19/2021    Lab Results  Component Value Date   ALT 10 03/19/2021   AST 38 03/19/2021   ALKPHOS 63 03/19/2021   BILITOT 0.5 03/19/2021      Microbiology: Recent Results (from the past 240 hour(s))  Culture, blood (routine x 2)     Status: None   Collection Time: 03/10/21  3:48 AM   Specimen: BLOOD LEFT HAND  Result Value Ref Range Status   Specimen Description   Final    BLOOD LEFT HAND Performed at [facility], [place] [address], [place], [place] [address]    Special Requests   Final    BOTTLES DRAWN AEROBIC AND ANAEROBIC Blood Culture adequate volume Performed at [facility] [address] [place], [place] [address]    Culture   Final    NO GROWTH 5 DAYS Performed at [facility], [place] [address], [place], [place] [address]    Report Status 03/15/2021 FINAL   Final  Culture, blood (routine x 2)     Status: None   Collection Time: 03/10/21  3:55 AM   Specimen: BLOOD LEFT FOREARM  Result Value Ref Range Status   Specimen Description   Final    BLOOD LEFT FOREARM Performed at [facility], [place] [address] [place], [place] [address]    Special Requests   Final    BOTTLES DRAWN AEROBIC ONLY Blood Culture results may not be optimal due to an inadequate volume of blood received in culture bottles Performed at [facility] [address] [place], [place] [address]    Culture   Final    NO GROWTH 5 DAYS Performed at [facility], [place] [address] [place], [place] [address]    Report Status 03/15/2021 FINAL  Final  Culture, blood (routine x 2)     Status: None   Collection Time: 03/11/21  3:52 AM   Specimen:  BLOOD LEFT HAND  Result Value Ref Range Status   Specimen Description   Final    BLOOD LEFT HAND Performed at [facility] [address] [place], [place] [address]    Special Requests   Final    BOTTLES DRAWN AEROBIC ONLY Blood Culture adequate volume Performed at [facility] [address] [place], [place] [address]    Culture   Final    NO GROWTH 5 DAYS Performed at [facility], [place] [address], [place], [place] [address]    Report Status 03/16/2021 FINAL  Final     Serology:   Imaging: If present, new imagings (plain films, ct scans, and mri) have been personally visualized and interpreted; radiology reports have been reviewed. Decision making incorporated into the Impression / Recommendations.  1/28 MRI LUMBAR AND THORACIC SPINE wwo contrast 1. Abnormal epidural enhancement throughout the lumbar spine with cranial extension into the midthoracic spine consistent with infection. Suspected associated loculated subdural fluid collections in the lumbar spine as well as leptomeningeal/subarachnoid space infection in the lumbar and thoracic spine. 2.  Abscesses involving the right-sided lumbar posterior paraspinal musculature and left psoas muscle. 3. Potential septic arthritis of multiple lumbar facet joints, most suspiciously right L3-4 and L4-5. 4. Septic versus degenerative arthritis of the left T1-2 facet joint. 5. Innumerable small nonenhancing/cystic foci throughout both kidneys. Renal septic emboli not excluded. 6. Marked distention of the bladder with moderate bilateral hydronephrosis.  1/23 left shoulder xray No acute osseous abnormality identified.  1/23 tte  1. Left ventricular ejection fraction, by estimation, is 60 to 65%. The  left ventricle has normal function. The left ventricle has no regional  wall motion abnormalities. There is mild left ventricular hypertrophy.  Left ventricular diastolic parameters  were normal.   2. Right ventricular systolic function is normal. The right ventricular  size is normal.   3. The mitral valve is normal in structure. Trivial mitral valve  regurgitation. No evidence of mitral stenosis.   4. The aortic valve is normal in structure. Aortic valve regurgitation is  not visualized. No aortic stenosis is present.   5. The inferior vena cava is normal in size with greater than 50%  respiratory variability, suggesting right atrial pressure of 3 mmHg.    1/22 ct chest 1. Trace bilateral pleural effusions. Mild bilateral lower lobe bronchial wall thickening which could be due to inflammatory process. Partial consolidations in the lower lobes could reflect atelectasis or mild pneumonia. 2. Possible 7 mm left apical lung nodule. Non-contrast chest CT at 6-12 months is recommended. If the nodule is stable at time of repeat CT, then future CT at 18-24 months (from today's scan) is considered optional for low-risk patients, but is recommended for high-risk patients  1/22 ct cspine 1. No CT evidence for acute intracranial abnormality.  Atrophy. 2. Anterolisthesis C3 on C4 probably due  to degenerative change. No definitive fracture is seen. Advanced degenerative changes at multiple levels in the cervical spine.   Jabier Mutton, [facility] for Infectious [place] [phone] pager    03/19/2021, 1:54 PM

## 2021-03-19 NOTE — Progress Notes (Signed)
Staff RN paged Chaplain for information regarding AD.  Chaplain supported staff in contacting office on Thursday morning.  Daughter of patient requested that a Chaplain call her on her cell.  Chaplain called daughter "Anderson Malta" and there was no answer nor a call back this evening.    Staff member was  encouraged to check back into Spiritual Care office in morning.      03/19/21 0000  Clinical Encounter Type  Visited With Other (Comment) (RN called Chaplain for AD)  Referral From Nurse  Consult/Referral To Chaplain  Spiritual Encounters  Spiritual Needs Other (Comment) (Advanced Directive to be completed)  Stress Factors  Patient Stress Factors None identified  Family Stress Factors None identified

## 2021-03-20 LAB — COMPREHENSIVE METABOLIC PANEL
ALT: 8 U/L (ref 0–44)
AST: 38 U/L (ref 15–41)
Albumin: 2.2 g/dL — ABNORMAL LOW (ref 3.5–5.0)
Alkaline Phosphatase: 58 U/L (ref 38–126)
Anion gap: 10 (ref 5–15)
BUN: 41 mg/dL — ABNORMAL HIGH (ref 8–23)
CO2: 27 mmol/L (ref 22–32)
Calcium: 9.1 mg/dL (ref 8.9–10.3)
Chloride: 108 mmol/L (ref 98–111)
Creatinine, Ser: 1.45 mg/dL — ABNORMAL HIGH (ref 0.44–1.00)
GFR, Estimated: 38 mL/min — ABNORMAL LOW (ref >60)
Glucose, Bld: 144 mg/dL — ABNORMAL HIGH (ref 70–99)
Potassium: 3.9 mmol/L (ref 3.5–5.1)
Sodium: 145 mmol/L (ref 135–145)
Total Bilirubin: 0.4 mg/dL (ref 0.3–1.2)
Total Protein: 6.3 g/dL — ABNORMAL LOW (ref 6.5–8.1)

## 2021-03-20 LAB — CBC WITH DIFFERENTIAL/PLATELET
Abs Immature Granulocytes: 0.13 10*3/uL — ABNORMAL HIGH (ref 0.00–0.07)
Basophils Absolute: 0.1 10*3/uL (ref 0.0–0.1)
Basophils Relative: 1 %
Eosinophils Absolute: 0.1 10*3/uL (ref 0.0–0.5)
Eosinophils Relative: 1 %
HCT: 26.1 % — ABNORMAL LOW (ref 36.0–46.0)
Hemoglobin: 8.2 g/dL — ABNORMAL LOW (ref 12.0–15.0)
Immature Granulocytes: 1 %
Lymphocytes Relative: 8 %
Lymphs Abs: 0.9 10*3/uL (ref 0.7–4.0)
MCH: 31.8 pg (ref 26.0–34.0)
MCHC: 31.4 g/dL (ref 30.0–36.0)
MCV: 101.2 fL — ABNORMAL HIGH (ref 80.0–100.0)
Monocytes Absolute: 0.6 10*3/uL (ref 0.1–1.0)
Monocytes Relative: 6 %
Neutro Abs: 9.3 10*3/uL — ABNORMAL HIGH (ref 1.7–7.7)
Neutrophils Relative %: 83 %
Platelets: 315 10*3/uL (ref 150–400)
RBC: 2.58 MIL/uL — ABNORMAL LOW (ref 3.87–5.11)
RDW: 14.3 % (ref 11.5–15.5)
WBC: 11 10*3/uL — ABNORMAL HIGH (ref 4.0–10.5)
nRBC: 0 % (ref 0.0–0.2)

## 2021-03-20 LAB — GLUCOSE, CAPILLARY
Glucose-Capillary: 249 mg/dL — ABNORMAL HIGH (ref 70–99)
Glucose-Capillary: 152 mg/dL — ABNORMAL HIGH (ref 70–99)
Glucose-Capillary: 107 mg/dL — ABNORMAL HIGH (ref 70–99)
Glucose-Capillary: 159 mg/dL — ABNORMAL HIGH (ref 70–99)

## 2021-03-20 LAB — MAGNESIUM: Magnesium: 2.1 mg/dL (ref 1.7–2.4)

## 2021-03-20 NOTE — Progress Notes (Signed)
Progress Note   Patient: [name] [name] DOB: 1945/03/16 DOA: 03/08/2021     12 DOS: the patient was seen and examined on 03/20/2021   Brief hospital course: [name] [name] is a 76 year old female with PMHhypothyroidism, diastolic CHF, stage IIIb chronic kidney disease and mood disorder who presented to the ER on 03/08/2021 after multiple falls at home.  Although poor historian, patient is noted to have slow decline.  In the emergency room, found to be hypoxic and septic from pneumonia.  Following admission, blood cultures positive for MSSA.   MRI noted widespread enhancement of the lower thoracic and lumbar regions involving the dura as well as surface of conus and cauda equina nerve roots with some subdural collections in the lumbar spine.  Discussed case with neurosurgery and after reviewing studies, they do not see any findings for frank epidural abscess in these regions, and therefore no role for open surgical intervention.  Given enhancement of leptomeninges, neurosurgery recommended infection has spread into CNS and dosing antibiotics for CNS infection recommended.  Plan by infectious disease is to continue on Ancef checking CRP every 72 hours and change antibiotics to nafcillin if CRP worsens or mentation worsens.  Assessment and Plan: MSSA bacteremia- (present on admission) - 4/4 bottles positive on admission (1/22) with MSSA. Differential for etiology at this time includes recent outpt steroid injection on 1/20 to Left deltoid vs vertebral infection (worsening lower back pain) vs spontaneous bacteremia vs translocation from pulmonary source - abx changed to Unasyn which should cover presumed sources for now - obtain MRI left shoulder. ID has ordered MRI T/L spine.  Unfortunately, patient too agitated to stay still, so trying to reorder with general anesthesia.   - TTE on 1/23: some poor images but no obvious vegetations (EF 60-65%, mild LVH).   Blood cultures from 1/24, 1/25 =  negative   Sepsis ([place])- (present on admission) - Febrile, tachycardia, tachypnea, leukocytosis; presumed lung source given opacities on CT chest with possible aspiration however now having worsening left shoulder pain (s/p L deltoid kenalog injection on 03/06/21) and ongoing low back pain.  White count has been steadily improving for several days. - Given vancomycin, cefepime, Flagyl in the ER - No significant risk factors for MDRO, then changed to CTX/azithro after blood cultures grew MSSA>> changed to Unasyn, and now ultimately is on Ancef - continue q72 hour CRP check; if worsening CRP or mentation then possibly will be changed to nafcillin for 2 weeks.  ID following, greatly appreciate assistance  Acute renal failure superimposed on stage 3b chronic kidney disease ([place])- (present on admission) - patient has history of CKD3b. Baseline creat ~ 1.3, eGFR 38 -Creatinine on admission at 2.47 and with fluids, had improved, down to 1.45 on 1/29.  Since then, mild increase in today at 1.57.  Questioning if this could be some acute heart failure and cardiorenal disease.  We will stop fluids and give Lasix and follow accordingly. - bilateral hydronephrosis noted on MRI.  Patient has a pure wick with ongoing urine output however abdomen remains distended concerning for urine retention and/or constipation - s/p bladder scan on 2/2 with large PVR; foley placed with ~3200 cc immediately out in foley - keep foley in place as she's likely so deconditioned it's going to take time for regaining function  Acute respiratory failure with hypoxia (HCC)-resolved as of 03/17/2021, (present on admission) - Presumed due to underlying pneumonia/aspiration -Patient doing well.  Down to 2 L nasal cannula to 100%.  Attempting to  wean off oxygen altogether - See sepsis and bacteremia work-up otherwise - remains on RA  Hyponatremia-resolved as of 03/09/2021, (present on admission) - presumed hypovolemic hyponatremia.   Resolved with IV fluids  Acute urinary retention - Differential for etiology includes severe deconditioning as evidenced by requiring 3+ person assist for bed transfers vs spinal cord involvement due to underlying infection - for now deconditioning higher on differential and foley is now in place as of 2/2 - keep foley in place until patient significantly improved physically and mentally as low chance of passing a voiding trial anytime soon - will follow up repeat MRIs next week to re-evaluate spinal cord involvement as well   Fall at home, initial encounter - Patient found on floor by EMS on their arrival.  Patient states floor is carpeted although she cannot provide any further collateral information regarding her fall or length of time on the floor - Given renal failure on admission which is still likely prerenal, check CK - Multiple imaging studies on admission including right wrist x-ray, CT head, CT cervical spine, and CT chest: All with no acute abnormalities but does have 4 mm anterolisthesis C3 on C4 - continue pain control - continue PT/OT  DMII (diabetes mellitus, type 2) (HCC) - History of prediabetes, no A1c on file - A1c on admission 6.6% - Treat with sliding scale for now and diet control   Chronic diastolic heart failure ([place])- (present on admission) - No S/S exacerbation on exam.  Patient is volume depleted clinically Because of need for D5W, has been getting fluids at 125 cc an hour.  BNP elevated given additional fluids.  Repeat BNP on 1/31 normal.  At this point now, we will stop fluids and start Lasix and follow renal function appropriately. -Last echo reviewed from 11/06/2020: Moderate MR, impaired relaxation, EF 65% - TTE repeated on 1/23, EF 60-65%, mild LVH; trivial MR on read; normal diastology   Vertebral osteomyelitis ([place])- (present on admission) Appreciate infectious disease and neurosurgery assistance.  For now on IV Ancef.  Follow CRP and procalcitonin.   Patient declines TEE however management would not change at this point   [place], oral Started on fluconazole.  Slowly improving. - complete 14 day course  Morbid obesity ([place])- (present on admission) Meets criteria for BMI greater than 35+ comorbidities of heart failure and diabetes and hypertension  Hypothyroidism- (present on admission) - TSH, 0.429 - continue Synthroid  Normocytic anemia- (present on admission) - No complaints or signs of bleeding.  No recent lab work other than July 2021 (12.3 g/dL at that time).  Hemoglobin stable since admission.  Elevated LFTs- (present on admission) Suspect secondary to shock liver from sepsis.  Transaminases slowly improving  Hypernatremia-resolved as of 03/14/2021 Due to poor p.o. intake from altered mentation.  Have changed IV fluids to half-normal saline.  Started on D5W after half-normal saline not improving sodium.  Peaking at 157, sodium has since improved with D5W.  Acute metabolic encephalopathy-resolved as of 03/17/2021 - awake but train of thought still intermittently abnormal      Subjective: No events overnight.  She feels a little better after Foley was placed yesterday but still has not had a bowel movement.  She was again uncomfortable sitting in the recliner asking to get back in bed which I again encouraged her to remain in recliner given her deconditioned nature. Lunch tray was in front of her but she had not yet begun eating and was asking me to help feed her which  has been her behavior recently.  Physical Exam: Vitals:   03/19/21 0857 03/19/21 1523 03/19/21 2107 03/20/21 0450  BP: 134/81 (!) 135/50 (!) 139/55 (!) 123/54  Pulse: (!) 107 93 100 97  Resp:  18  20  Temp:  97.7 F (36.5 C) 99 F (37.2 C) 98.5 F (36.9 C)  TempSrc:   Oral Oral  SpO2: 98% 99% 98% 94%  Weight:      Height:      Physical Exam Constitutional:      Comments: More awake, alert, NAD  HENT:     Head: Normocephalic.     Comments:  Bruising noted along right forehead    Mouth/Throat:     Mouth: Mucous membranes are moist.  Eyes:     Extraocular Movements: Extraocular movements intact.  Cardiovascular:     Rate and Rhythm: Normal rate and regular rhythm.     Comments: Subtle 2/6 HSM noted in precordium Pulmonary:     Effort: Pulmonary effort is normal.     Breath sounds: Normal breath sounds.  Abdominal:     General: Bowel sounds are normal.     Tenderness: There is no abdominal tenderness.     Comments: Distention improved and abdomen now soft after Foley placement.  No tenderness.  Musculoskeletal:     Cervical back: Normal range of motion and neck supple.     Comments: Improved ROM in bilateral upper extremities in all joints.  She required a lot of coaching to perform active ROM notably with her bilateral shoulders  Skin:    General: Skin is warm and dry.  Neurological:     General: No focal deficit present.     Comments: Some slight confusion still appreciated but overall significantly improved  Psychiatric:        Mood and Affect: Mood normal.     Data Reviewed:  I have Reviewed nursing notes, Vitals, and Lab results since pt's last encounter. Pertinent lab results creat 1.45, CRP 19.6  I have ordered test including BMP, CBC, Mg I have reviewed the last note from all staff over past 24 hours,  I have discussed pt's care plan and test results with nursing staff, CM.   Family Communication:    Code Status: Full Code  Disposition: Status is: Inpatient Remains inpatient appropriate because: ongoing treatment of osteomyelitis, severe deconditioning and transient altered mentation       Planned Discharge Destination: Skilled nursing facility DVT ppx: HSQ    Author: Dwyane Dee, MD 03/20/2021 4:39 PM  For on call review [email or web address].

## 2021-03-20 NOTE — Progress Notes (Signed)
Physical Therapy Treatment Patient Details Name: [name] [name] MRN: [id number] DOB: December 10, 1945 Today's Date: 03/20/2021   History of Present Illness 76 yo female presents to ED on 1/22 with multiple falls, min confusion, hypoxia; workup for sepsis, ARF. CT chest showed bilateral pleural effusions, bilateral lower lobe bronchial wall thickening with consolidations concerning for possible pneumonia, possible 7 mm left apical lung nodule.  recent outpatient Kenalog injection in the left deltoid on 03/06/2021. PMH anxiety, dCHF, HLD, hypothyroidism, IBS, mood disorder, prediabetes, CKD 3b.    PT Comments    Patient required encouragement to participate and mobilize to EOB. Max +2 for supine>sit and pt requires constant posterior support at back to prevent LOB. Patient able to stand +2 Max assist with Stedy to rise and clear hips from EOB. Third person to assist with bringing Stedy paddles down. Pt total assist to transfer to recliner and RN/NT aware of lift equipment and +3 to return to bed.     Recommendations for follow up therapy are one component of a multi-disciplinary discharge planning process, led by the attending physician.  Recommendations may be updated based on patient status, additional functional criteria and insurance authorization.  Follow Up Recommendations  Skilled nursing-short term rehab (<3 hours/day)     Assistance Recommended at Discharge Frequent or constant Supervision/Assistance  Patient can return home with the following Two people to help with walking and/or transfers;Two people to help with bathing/dressing/bathroom;Assistance with feeding;Assistance with cooking/housework;Assist for transportation;Help with stairs or ramp for entrance   Equipment Recommendations  Other (comment)    Recommendations for Other Services       Precautions / Restrictions Precautions Precautions: Fall Restrictions Weight Bearing Restrictions: No     Mobility  Bed  Mobility Overal bed mobility: Needs Assistance Bed Mobility: Supine to Sit Rolling: Mod assist, +2 for physical assistance, +2 for safety/equipment   Supine to sit: Max assist, +2 for physical assistance, +2 for safety/equipment, HOB elevated     General bed mobility comments: Mod assist with cues to initaite roll to pivot towards EOB. Max +2 to progress LE's off EOB and to press up trunk and steady self. Pt wiht Lt lean sitting EOB and cues required to increased pressure in Rt forearm to obtain neutral/midline posture in sitting. Pt required Max posterior support at EOB.    Transfers Overall transfer level: Needs assistance   Transfers: Sit to/from Stand, Bed to chair/wheelchair/BSC Sit to Stand: +2 physical assistance, +2 safety/equipment, From elevated surface, Mod assist           General transfer comment: Mod+2 with use of pad under hips to initiate rise off EOB. +3 to fully shift hip forward for Stedy paddles to swing back behind pt. Pt completed 3 stands from EOB and 1 from Stedy paddles prior to lowering into recliner. Transfer via Lift Equipment: Stedy  Ambulation/Gait                   Stairs             Wheelchair Mobility    Modified Rankin (Stroke Patients Only)       Balance Overall balance assessment: Needs assistance Sitting-balance support: Bilateral upper extremity supported, Feet supported Sitting balance-Leahy Scale: Fair   Postural control: Posterior lean Standing balance support: Bilateral upper extremity supported Standing balance-Leahy Scale: Poor                              Cognition Arousal/Alertness:  Awake/alert Behavior During Therapy: WFL for tasks assessed/performed Overall Cognitive Status: No family/caregiver present to determine baseline cognitive functioning Area of Impairment: Problem solving, Safety/judgement, Following commands                   Current Attention Level: Focused Memory:  Decreased short-term memory Following Commands: Follows one step commands inconsistently Safety/Judgement: Decreased awareness of deficits, Decreased awareness of safety Awareness: Intellectual Problem Solving: Slow processing, Requires verbal cues, Requires tactile cues, Difficulty sequencing General Comments: pt with negative talk regarding her mobility and becoming more aware of her impairments. pt agreeable to participate after encouragement today.        Exercises      General Comments        Pertinent Vitals/Pain Pain Assessment Faces Pain Scale: Hurts little more Pain Location: generalized, back Pain Descriptors / Indicators: Discomfort, Sore, Aching Pain Intervention(s): Limited activity within patient's tolerance, Monitored during session, Repositioned    Home Living                          Prior Function            PT Goals (current goals can now be found in the care plan section) Acute Rehab PT Goals PT Goal Formulation: Patient unable to participate in goal setting Time For Goal Achievement: 03/25/21 Potential to Achieve Goals: Fair Progress towards PT goals: Progressing toward goals    Frequency    Min 2X/week      PT Plan Current plan remains appropriate    Co-evaluation              AM-PAC PT "6 Clicks" Mobility   Outcome Measure  Help needed turning from your back to your side while in a flat bed without using bedrails?: A Lot Help needed moving from lying on your back to sitting on the side of a flat bed without using bedrails?: Total Help needed moving to and from a bed to a chair (including a wheelchair)?: Total Help needed standing up from a chair using your arms (e.g., wheelchair or bedside chair)?: Total Help needed to walk in hospital room?: Total Help needed climbing 3-5 steps with a railing? : Total 6 Click Score: 7    End of Session Equipment Utilized During Treatment: Oxygen Activity Tolerance: Patient limited  by fatigue Patient left: in chair;with call bell/phone within reach;with chair alarm set Nurse Communication: Mobility status PT Visit Diagnosis: Other abnormalities of gait and mobility (R26.89);Muscle weakness (generalized) (M62.81)     Time: 0109-3235 PT Time Calculation (min) (ACUTE ONLY): 36 min  Charges:  $Therapeutic Activity: 23-37 mins                     Verner Mould, DPT Acute Rehabilitation Services Office [phone] Pager [phone]    Jacques Navy 03/20/2021, 12:40 PM

## 2021-03-21 LAB — CBC WITH DIFFERENTIAL/PLATELET
Abs Immature Granulocytes: 0.11 10*3/uL — ABNORMAL HIGH (ref 0.00–0.07)
Basophils Absolute: 0 10*3/uL (ref 0.0–0.1)
Basophils Relative: 1 %
Eosinophils Absolute: 0.1 10*3/uL (ref 0.0–0.5)
Eosinophils Relative: 2 %
HCT: 26 % — ABNORMAL LOW (ref 36.0–46.0)
Hemoglobin: 8.1 g/dL — ABNORMAL LOW (ref 12.0–15.0)
Immature Granulocytes: 1 %
Lymphocytes Relative: 10 %
Lymphs Abs: 0.9 10*3/uL (ref 0.7–4.0)
MCH: 31.6 pg (ref 26.0–34.0)
MCHC: 31.2 g/dL (ref 30.0–36.0)
MCV: 101.6 fL — ABNORMAL HIGH (ref 80.0–100.0)
Monocytes Absolute: 0.5 10*3/uL (ref 0.1–1.0)
Monocytes Relative: 6 %
Neutro Abs: 6.8 10*3/uL (ref 1.7–7.7)
Neutrophils Relative %: 80 %
Platelets: 297 10*3/uL (ref 150–400)
RBC: 2.56 MIL/uL — ABNORMAL LOW (ref 3.87–5.11)
RDW: 14.2 % (ref 11.5–15.5)
WBC: 8.4 10*3/uL (ref 4.0–10.5)
nRBC: 0 % (ref 0.0–0.2)

## 2021-03-21 LAB — GLUCOSE, CAPILLARY
Glucose-Capillary: 116 mg/dL — ABNORMAL HIGH (ref 70–99)
Glucose-Capillary: 206 mg/dL — ABNORMAL HIGH (ref 70–99)
Glucose-Capillary: 107 mg/dL — ABNORMAL HIGH (ref 70–99)
Glucose-Capillary: 138 mg/dL — ABNORMAL HIGH (ref 70–99)

## 2021-03-21 LAB — COMPREHENSIVE METABOLIC PANEL
ALT: 7 U/L (ref 0–44)
AST: 42 U/L — ABNORMAL HIGH (ref 15–41)
Albumin: 2.1 g/dL — ABNORMAL LOW (ref 3.5–5.0)
Alkaline Phosphatase: 64 U/L (ref 38–126)
Anion gap: 11 (ref 5–15)
BUN: 37 mg/dL — ABNORMAL HIGH (ref 8–23)
CO2: 27 mmol/L (ref 22–32)
Calcium: 8.8 mg/dL — ABNORMAL LOW (ref 8.9–10.3)
Chloride: 100 mmol/L (ref 98–111)
Creatinine, Ser: 1.47 mg/dL — ABNORMAL HIGH (ref 0.44–1.00)
GFR, Estimated: 37 mL/min — ABNORMAL LOW (ref >60)
Glucose, Bld: 179 mg/dL — ABNORMAL HIGH (ref 70–99)
Potassium: 3.7 mmol/L (ref 3.5–5.1)
Sodium: 138 mmol/L (ref 135–145)
Total Bilirubin: 0.4 mg/dL (ref 0.3–1.2)
Total Protein: 6.3 g/dL — ABNORMAL LOW (ref 6.5–8.1)

## 2021-03-21 LAB — MAGNESIUM: Magnesium: 2 mg/dL (ref 1.7–2.4)

## 2021-03-21 LAB — C-REACTIVE PROTEIN: CRP: 8.4 mg/dL — ABNORMAL HIGH (ref <1.0)

## 2021-03-21 NOTE — Progress Notes (Signed)
Progress Note   Patient: [name] [name] QJF:[id number] DOB: 09/16/1945 DOA: 03/08/2021     13 DOS: the patient was seen and examined on 03/21/2021   Brief hospital course: Ms. Beumer is a 76 year old female with PMHhypothyroidism, diastolic CHF, stage IIIb chronic kidney disease and mood disorder who presented to the ER on 03/08/2021 after multiple falls at home.  Although poor historian, patient is noted to have slow decline.  In the emergency room, found to be hypoxic and septic from pneumonia.  Following admission, blood cultures positive for MSSA.   MRI noted widespread enhancement of the lower thoracic and lumbar regions involving the dura as well as surface of conus and cauda equina nerve roots with some subdural collections in the lumbar spine.  Discussed case with neurosurgery and after reviewing studies, they do not see any findings for frank epidural abscess in these regions, and therefore no role for open surgical intervention.  Given enhancement of leptomeninges, neurosurgery recommended infection has spread into CNS and dosing antibiotics for CNS infection recommended.  Plan by infectious disease is to continue on Ancef checking CRP every 72 hours and change antibiotics to nafcillin if CRP worsens or mentation worsens.  Assessment and Plan: MSSA bacteremia- (present on admission) - 4/4 bottles positive on admission (1/22) with MSSA. Differential for etiology at this time includes recent outpt steroid injection on 1/20 to Left deltoid vs vertebral infection (worsening lower back pain) vs spontaneous bacteremia vs translocation from pulmonary source - abx changed to Unasyn which should cover presumed sources for now - obtain MRI left shoulder. ID has ordered MRI T/L spine.  Unfortunately, patient too agitated to stay still, so trying to reorder with general anesthesia.   - TTE on 1/23: some poor images but no obvious vegetations (EF 60-65%, mild LVH).   Blood cultures from 1/24, 1/25 =  negative   Sepsis ([place])- (present on admission) - Febrile, tachycardia, tachypnea, leukocytosis; presumed lung source given opacities on CT chest with possible aspiration however now having worsening left shoulder pain (s/p L deltoid kenalog injection on 03/06/21) and ongoing low back pain.  White count has been steadily improving for several days. - Given vancomycin, cefepime, Flagyl in the ER - No significant risk factors for MDRO, then changed to CTX/azithro after blood cultures grew MSSA>> changed to Unasyn, and now ultimately is on Ancef - continue q72 hour CRP check; if worsening CRP or mentation then possibly will be changed to nafcillin for 2 weeks.  ID following, greatly appreciate assistance  Acute renal failure superimposed on stage 3b chronic kidney disease ([place])- (present on admission) - patient has history of CKD3b. Baseline creat ~ 1.3, eGFR 38 -Creatinine on admission at 2.47 and with fluids, had improved, down to 1.45 on 1/29.  Since then, mild increase in today at 1.57.  Questioning if this could be some acute heart failure and cardiorenal disease.  We will stop fluids and give Lasix and follow accordingly. - bilateral hydronephrosis noted on MRI.  Patient has a pure wick with ongoing urine output however abdomen remains distended concerning for urine retention and/or constipation - s/p bladder scan on 2/2 with large PVR; foley placed with ~3200 cc immediately out in foley - keep foley in place as she's likely so deconditioned it's going to take time for regaining function  Acute respiratory failure with hypoxia (HCC)-resolved as of 03/17/2021, (present on admission) - Presumed due to underlying pneumonia/aspiration -Patient doing well.  Down to 2 L nasal cannula to 100%.  Attempting to  wean off oxygen altogether - See sepsis and bacteremia work-up otherwise - remains on RA  Hyponatremia-resolved as of 03/09/2021, (present on admission) - presumed hypovolemic hyponatremia.   Resolved with IV fluids  Acute urinary retention - Differential for etiology includes severe deconditioning as evidenced by requiring 3+ person assist for bed transfers vs spinal cord involvement due to underlying infection - for now deconditioning higher on differential and foley is now in place as of 2/2 - keep foley in place until patient significantly improved physically and mentally as low chance of passing a voiding trial anytime soon - will follow up repeat MRIs next week to re-evaluate spinal cord involvement as well   Fall at home, initial encounter - Patient found on floor by EMS on their arrival.  Patient states floor is carpeted although she cannot provide any further collateral information regarding her fall or length of time on the floor - Given renal failure on admission which is still likely prerenal, check CK - Multiple imaging studies on admission including right wrist x-ray, CT head, CT cervical spine, and CT chest: All with no acute abnormalities but does have 4 mm anterolisthesis C3 on C4 - continue pain control - continue PT/OT  DMII (diabetes mellitus, type 2) (HCC) - History of prediabetes, no A1c on file - A1c on admission 6.6% - Treat with sliding scale for now and diet control   Chronic diastolic heart failure ([place])- (present on admission) - No S/S exacerbation on exam.  Patient is volume depleted clinically Because of need for D5W, has been getting fluids at 125 cc an hour.  BNP elevated given additional fluids.  Repeat BNP on 1/31 normal.  At this point now, we will stop fluids and start Lasix and follow renal function appropriately. -Last echo reviewed from 11/06/2020: Moderate MR, impaired relaxation, EF 65% - TTE repeated on 1/23, EF 60-65%, mild LVH; trivial MR on read; normal diastology   Vertebral osteomyelitis ([place])- (present on admission) Appreciate infectious disease and neurosurgery assistance.  For now on IV Ancef.  Follow CRP and procalcitonin.   Patient declines TEE however management would not change at this point   [place], oral Started on fluconazole.  Slowly improving. - complete 14 day course  Morbid obesity ([place])- (present on admission) Meets criteria for BMI greater than 35+ comorbidities of heart failure and diabetes and hypertension  Hypothyroidism- (present on admission) - TSH, 0.429 - continue Synthroid  Normocytic anemia- (present on admission) - No complaints or signs of bleeding.  No recent lab work other than July 2021 (12.3 g/dL at that time).  Hemoglobin stable since admission.  Elevated LFTs- (present on admission) Suspect secondary to shock liver from sepsis.  Transaminases slowly improving  Hypernatremia-resolved as of 03/14/2021 Due to poor p.o. intake from altered mentation.  Have changed IV fluids to half-normal saline.  Started on D5W after half-normal saline not improving sodium.  Peaking at 157, sodium has since improved with D5W.  Acute metabolic encephalopathy-resolved as of 03/17/2021 - awake but train of thought still intermittently abnormal      Subjective: No events overnight.  Awake and alert; mentation maybe a little better still. Son present at the end of my visit. Still no BM yet.   Physical Exam: Vitals:   03/20/21 1400 03/20/21 1946 03/21/21 0511 03/21/21 1601  BP: 121/67 (!) 108/96 (!) 114/47 (!) 122/46  Pulse: 99 (!) 102 (!) 104 94  Resp: 18 16 16 20   Temp: 98.4 F (36.9 C) 98.6 F (37 C)  98.4 F (36.9 C) 98.9 F (37.2 C)  TempSrc: Oral Oral Oral Oral  SpO2: 97% 92% 95% 98%  Weight:      Height:      Physical Exam Constitutional:      Comments: More awake, alert, NAD  HENT:     Head: Normocephalic.     Comments: Bruising noted along right forehead    Mouth/Throat:     Mouth: Mucous membranes are moist.  Eyes:     Extraocular Movements: Extraocular movements intact.  Cardiovascular:     Rate and Rhythm: Normal rate and regular rhythm.     Comments: Subtle 2/6 HSM  noted in precordium Pulmonary:     Effort: Pulmonary effort is normal.     Breath sounds: Normal breath sounds.  Abdominal:     General: Bowel sounds are normal.     Tenderness: There is no abdominal tenderness.     Comments: Distention improved and abdomen now soft after Foley placement.  No tenderness.  Musculoskeletal:     Cervical back: Normal range of motion and neck supple.     Comments: Improved ROM in bilateral upper extremities in all joints.  She required a lot of coaching to perform active ROM notably with her bilateral shoulders  Skin:    General: Skin is warm and dry.  Neurological:     General: No focal deficit present.     Comments: Some slight confusion still appreciated but overall significantly improved  Psychiatric:        Mood and Affect: Mood normal.     Data Reviewed:  I have Reviewed nursing notes, Vitals, and Lab results since pt's last encounter. Pertinent lab results creat 1.47, CRP 19.6  I have ordered test including BMP, CBC, Mg I have reviewed the last note from all staff over past 24 hours,  I have discussed pt's care plan and test results with nursing staff, CM.   Family Communication: son   Code Status: Full Code  Disposition: Status is: Inpatient Remains inpatient appropriate because: ongoing treatment of osteomyelitis, severe deconditioning and transient altered mentation     Planned Discharge Destination: Skilled nursing facility DVT ppx: HSQ    Author: Dwyane Dee, MD 03/21/2021 5:29 PM  For on call review [email or web address].

## 2021-03-21 NOTE — Progress Notes (Signed)
Pharmacy consulted to dose IV Ancef - anticipate long-term treatment - borderline renal function  Pharmacy will follow peripherally, leaving notes only for dose adjustments or other clinical changes  Reuel Boom, PharmD, BCPS [phone] 03/21/2021, 1:09 PM

## 2021-03-22 LAB — CBC WITH DIFFERENTIAL/PLATELET
Abs Immature Granulocytes: 0.17 10*3/uL — ABNORMAL HIGH (ref 0.00–0.07)
Basophils Absolute: 0.1 10*3/uL (ref 0.0–0.1)
Basophils Relative: 1 %
Eosinophils Absolute: 0.1 10*3/uL (ref 0.0–0.5)
Eosinophils Relative: 1 %
HCT: 24.8 % — ABNORMAL LOW (ref 36.0–46.0)
Hemoglobin: 7.4 g/dL — ABNORMAL LOW (ref 12.0–15.0)
Immature Granulocytes: 2 %
Lymphocytes Relative: 10 %
Lymphs Abs: 0.9 10*3/uL (ref 0.7–4.0)
MCH: 32 pg (ref 26.0–34.0)
MCHC: 29.8 g/dL — ABNORMAL LOW (ref 30.0–36.0)
MCV: 107.4 fL — ABNORMAL HIGH (ref 80.0–100.0)
Monocytes Absolute: 0.5 10*3/uL (ref 0.1–1.0)
Monocytes Relative: 5 %
Neutro Abs: 7.4 10*3/uL (ref 1.7–7.7)
Neutrophils Relative %: 81 %
Platelets: 330 10*3/uL (ref 150–400)
RBC: 2.31 MIL/uL — ABNORMAL LOW (ref 3.87–5.11)
RDW: 14.4 % (ref 11.5–15.5)
WBC: 9.1 10*3/uL (ref 4.0–10.5)
nRBC: 0 % (ref 0.0–0.2)

## 2021-03-22 LAB — COMPREHENSIVE METABOLIC PANEL
ALT: 8 U/L (ref 0–44)
AST: 39 U/L (ref 15–41)
Albumin: 2.4 g/dL — ABNORMAL LOW (ref 3.5–5.0)
Alkaline Phosphatase: 71 U/L (ref 38–126)
Anion gap: 12 (ref 5–15)
BUN: 36 mg/dL — ABNORMAL HIGH (ref 8–23)
CO2: 27 mmol/L (ref 22–32)
Calcium: 9.2 mg/dL (ref 8.9–10.3)
Chloride: 101 mmol/L (ref 98–111)
Creatinine, Ser: 1.45 mg/dL — ABNORMAL HIGH (ref 0.44–1.00)
GFR, Estimated: 38 mL/min — ABNORMAL LOW (ref >60)
Glucose, Bld: 109 mg/dL — ABNORMAL HIGH (ref 70–99)
Potassium: 4.4 mmol/L (ref 3.5–5.1)
Sodium: 140 mmol/L (ref 135–145)
Total Bilirubin: 0.5 mg/dL (ref 0.3–1.2)
Total Protein: 6.8 g/dL (ref 6.5–8.1)

## 2021-03-22 LAB — GLUCOSE, CAPILLARY
Glucose-Capillary: 110 mg/dL — ABNORMAL HIGH (ref 70–99)
Glucose-Capillary: 149 mg/dL — ABNORMAL HIGH (ref 70–99)
Glucose-Capillary: 237 mg/dL — ABNORMAL HIGH (ref 70–99)
Glucose-Capillary: 102 mg/dL — ABNORMAL HIGH (ref 70–99)

## 2021-03-22 LAB — MAGNESIUM: Magnesium: 2.3 mg/dL (ref 1.7–2.4)

## 2021-03-22 MED ORDER — FUROSEMIDE 10 MG/ML IJ SOLN
20.0000 mg | Freq: Every day | INTRAMUSCULAR | Status: DC
  Administered 2021-03-22 – 2021-03-28 (×7): 20 mg via INTRAVENOUS
  Filled 2021-03-22 (×7): qty 2

## 2021-03-22 NOTE — Progress Notes (Signed)
Progress Note   Patient: [name] [name] NLZ:[id number] DOB: 1945/07/08 DOA: 03/08/2021     14 DOS: the patient was seen and examined on 03/22/2021   Brief hospital course: [name] [name] is a 76 year old female with PMHhypothyroidism, diastolic CHF, stage IIIb chronic kidney disease and mood disorder who presented to the ER on 03/08/2021 after multiple falls at home.  Although poor historian, patient is noted to have slow decline.  In the emergency room, found to be hypoxic and septic from pneumonia.  Following admission, blood cultures positive for MSSA.   MRI noted widespread enhancement of the lower thoracic and lumbar regions involving the dura as well as surface of conus and cauda equina nerve roots with some subdural collections in the lumbar spine.  Discussed case with neurosurgery and after reviewing studies, they do not see any findings for frank epidural abscess in these regions, and therefore no role for open surgical intervention.  Given enhancement of leptomeninges, neurosurgery recommended infection has spread into CNS and dosing antibiotics for CNS infection recommended.  Plan by infectious disease is to continue on Ancef checking CRP every 72 hours and change antibiotics to nafcillin if CRP worsens or mentation worsens.  Assessment and Plan: MSSA bacteremia- (present on admission) - 4/4 bottles positive on admission (1/22) with MSSA. Differential for etiology at this time includes recent outpt steroid injection on 1/20 to Left deltoid vs vertebral infection (worsening lower back pain) vs spontaneous bacteremia vs translocation from pulmonary source - abx changed to Unasyn which should cover presumed sources for now - obtain MRI left shoulder. ID has ordered MRI T/L spine.  Unfortunately, patient too agitated to stay still, so trying to reorder with general anesthesia.   - TTE on 1/23: some poor images but no obvious vegetations (EF 60-65%, mild LVH).   Blood cultures from 1/24, 1/25 =  negative   Sepsis ([place])- (present on admission) - Febrile, tachycardia, tachypnea, leukocytosis; presumed lung source given opacities on CT chest with possible aspiration however now having worsening left shoulder pain (s/p L deltoid kenalog injection on 03/06/21) and ongoing low back pain.  White count has been steadily improving for several days. - Given vancomycin, cefepime, Flagyl in the ER - No significant risk factors for MDRO, then changed to CTX/azithro after blood cultures grew MSSA>> changed to Unasyn, and now ultimately is on Ancef - continue q72 hour CRP check; if worsening CRP or mentation then possibly will be changed to nafcillin for 2 weeks.  ID following, greatly appreciate assistance  Acute renal failure superimposed on stage 3b chronic kidney disease ([place])- (present on admission) - patient has history of CKD3b. Baseline creat ~ 1.3, eGFR 38 -Creatinine on admission at 2.47 and with fluids, had improved, down to 1.45 on 1/29.  Since then, mild increase in today at 1.57.  Questioning if this could be some acute heart failure and cardiorenal disease.  We will stop fluids and give Lasix and follow accordingly. - bilateral hydronephrosis noted on MRI.  Patient has a pure wick with ongoing urine output however abdomen remains distended concerning for urine retention and/or constipation - s/p bladder scan on 2/2 with large PVR; foley placed with ~3200 cc immediately out in foley - keep foley in place as she's likely so deconditioned it's going to take time for regaining function  Acute respiratory failure with hypoxia (HCC)-resolved as of 03/17/2021, (present on admission) - Presumed due to underlying pneumonia/aspiration -Patient doing well.  Down to 2 L nasal cannula to 100%.  Attempting to  wean off oxygen altogether - See sepsis and bacteremia work-up otherwise - remains on RA  Hyponatremia-resolved as of 03/09/2021, (present on admission) - presumed hypovolemic hyponatremia.   Resolved with IV fluids  Acute urinary retention - Differential for etiology includes severe deconditioning as evidenced by requiring 3+ person assist for bed transfers vs spinal cord involvement due to underlying infection - for now deconditioning higher on differential and foley is now in place as of 2/2 - keep foley in place until patient significantly improved physically and mentally as low chance of passing a voiding trial anytime soon - will follow up repeat MRIs next week to re-evaluate spinal cord involvement as well   Fall at home, initial encounter - Patient found on floor by EMS on their arrival.  Patient states floor is carpeted although she cannot provide any further collateral information regarding her fall or length of time on the floor - Given renal failure on admission which is still likely prerenal, check CK - Multiple imaging studies on admission including right wrist x-ray, CT head, CT cervical spine, and CT chest: All with no acute abnormalities but does have 4 mm anterolisthesis C3 on C4 - continue pain control - continue PT/OT  DMII (diabetes mellitus, type 2) (HCC) - History of prediabetes, no A1c on file - A1c on admission 6.6% - Treat with sliding scale for now and diet control   Chronic diastolic heart failure ([place])- (present on admission) - No S/S exacerbation on exam.  Patient is volume depleted clinically Because of need for D5W, has been getting fluids at 125 cc an hour.  BNP elevated given additional fluids.  Repeat BNP on 1/31 normal.  At this point now, we will stop fluids and start Lasix and follow renal function appropriately. -Last echo reviewed from 11/06/2020: Moderate MR, impaired relaxation, EF 65% - TTE repeated on 1/23, EF 60-65%, mild LVH; trivial MR on read; normal diastology   Vertebral osteomyelitis ([place])- (present on admission) Appreciate infectious disease and neurosurgery assistance.  For now on IV Ancef.  Follow CRP and procalcitonin.   Patient declines TEE however management would not change at this point   [place], oral Started on fluconazole.  Slowly improving. - complete 14 day course  Morbid obesity ([place])- (present on admission) Meets criteria for BMI greater than 35+ comorbidities of heart failure and diabetes and hypertension  Hypothyroidism- (present on admission) - TSH, 0.429 - continue Synthroid  Normocytic anemia- (present on admission) - No complaints or signs of bleeding.  No recent lab work other than July 2021 (12.3 g/dL at that time).  Hemoglobin stable since admission.  Elevated LFTs- (present on admission) Suspect secondary to shock liver from sepsis.  Transaminases slowly improving  Hypernatremia-resolved as of 03/14/2021 Due to poor p.o. intake from altered mentation.  Have changed IV fluids to half-normal saline.  Started on D5W after half-normal saline not improving sodium.  Peaking at 157, sodium has since improved with D5W.  Acute metabolic encephalopathy-resolved as of 03/17/2021 - awake but train of thought still intermittently abnormal      Subjective: No events overnight.  Awake and alert; mentation a little better still. She has started to have some bowel movements since yesterday.  Physical Exam: Vitals:   03/21/21 0511 03/21/21 1601 03/21/21 2034 03/22/21 0541  BP: (!) 114/47 (!) 122/46 (!) 129/57 (!) 142/53  Pulse: (!) 104 94 89 94  Resp: 16 20 16 18   Temp: 98.4 F (36.9 C) 98.9 F (37.2 C) 98.1 F (36.7 C)  98.5 F (36.9 C)  TempSrc: Oral Oral Oral   SpO2: 95% 98% 98% 98%  Weight:      Height:      Physical Exam Constitutional:      Comments: More awake, alert, NAD  HENT:     Head: Normocephalic.     Comments: Bruising noted along right forehead    Mouth/Throat:     Mouth: Mucous membranes are moist.  Eyes:     Extraocular Movements: Extraocular movements intact.  Cardiovascular:     Rate and Rhythm: Normal rate and regular rhythm.     Comments: Subtle 2/6 HSM  noted in precordium Pulmonary:     Effort: Pulmonary effort is normal.     Breath sounds: Normal breath sounds.  Abdominal:     General: Bowel sounds are normal.     Tenderness: There is no abdominal tenderness.     Comments: Distention improved and abdomen now soft after Foley placement.  No tenderness.  Musculoskeletal:     Cervical back: Normal range of motion and neck supple.     Comments: Improved ROM in bilateral upper extremities in all joints.  She required a lot of coaching to perform active ROM notably with her bilateral shoulders Overall improved edema notably in upper extremities  Skin:    Comments: Generalized bruising throughout bilateral upper extremities worse in the left  Neurological:     General: No focal deficit present.     Comments: Some slight confusion still appreciated but overall significantly improved  Psychiatric:        Mood and Affect: Mood normal.     Data Reviewed:  I have Reviewed nursing notes, Vitals, and Lab results since pt's last encounter. Pertinent lab results creat 1.47, CRP 19.6  I have ordered test including BMP, CBC, Mg I have reviewed the last note from all staff over past 24 hours,  I have discussed pt's care plan and test results with nursing staff, CM.   Family Communication: son   Code Status: Full Code  Disposition: Status is: Inpatient Remains inpatient appropriate because: ongoing treatment of osteomyelitis, severe deconditioning and transient altered mentation     Planned Discharge Destination: Skilled nursing facility DVT ppx: HSQ    Author: Dwyane Dee, MD 03/22/2021 1:03 PM  For on call review [email or web address].

## 2021-03-22 NOTE — Progress Notes (Signed)
Chaplain attempted to see patient twice to follow up regarding her HCPOA.  Patient was asleep.  Chaplain will attempt again on Monday.  Chaplain Janne Napoleon, [place] Pager, [phone] 10:25 PM

## 2021-03-23 DIAGNOSIS — M4620 Osteomyelitis of vertebra, site unspecified: Secondary | ICD-10-CM | POA: Diagnosis not present

## 2021-03-23 DIAGNOSIS — L0291 Cutaneous abscess, unspecified: Secondary | ICD-10-CM | POA: Diagnosis not present

## 2021-03-23 DIAGNOSIS — R7881 Bacteremia: Secondary | ICD-10-CM | POA: Diagnosis not present

## 2021-03-23 DIAGNOSIS — B370 Candidal stomatitis: Secondary | ICD-10-CM | POA: Diagnosis not present

## 2021-03-23 LAB — COMPREHENSIVE METABOLIC PANEL
ALT: 7 U/L (ref 0–44)
AST: 30 U/L (ref 15–41)
Albumin: 2.2 g/dL — ABNORMAL LOW (ref 3.5–5.0)
Alkaline Phosphatase: 69 U/L (ref 38–126)
Anion gap: 10 (ref 5–15)
BUN: 38 mg/dL — ABNORMAL HIGH (ref 8–23)
CO2: 27 mmol/L (ref 22–32)
Calcium: 8.7 mg/dL — ABNORMAL LOW (ref 8.9–10.3)
Chloride: 98 mmol/L (ref 98–111)
Creatinine, Ser: 1.4 mg/dL — ABNORMAL HIGH (ref 0.44–1.00)
GFR, Estimated: 39 mL/min — ABNORMAL LOW (ref >60)
Glucose, Bld: 251 mg/dL — ABNORMAL HIGH (ref 70–99)
Potassium: 3.3 mmol/L — ABNORMAL LOW (ref 3.5–5.1)
Sodium: 135 mmol/L (ref 135–145)
Total Bilirubin: 0.2 mg/dL — ABNORMAL LOW (ref 0.3–1.2)
Total Protein: 6.6 g/dL (ref 6.5–8.1)

## 2021-03-23 LAB — CBC WITH DIFFERENTIAL/PLATELET
Abs Immature Granulocytes: 0.15 10*3/uL — ABNORMAL HIGH (ref 0.00–0.07)
Basophils Absolute: 0 10*3/uL (ref 0.0–0.1)
Basophils Relative: 0 %
Eosinophils Absolute: 0.1 10*3/uL (ref 0.0–0.5)
Eosinophils Relative: 1 %
HCT: 26.7 % — ABNORMAL LOW (ref 36.0–46.0)
Hemoglobin: 8.5 g/dL — ABNORMAL LOW (ref 12.0–15.0)
Immature Granulocytes: 2 %
Lymphocytes Relative: 9 %
Lymphs Abs: 0.9 10*3/uL (ref 0.7–4.0)
MCH: 32.1 pg (ref 26.0–34.0)
MCHC: 31.8 g/dL (ref 30.0–36.0)
MCV: 100.8 fL — ABNORMAL HIGH (ref 80.0–100.0)
Monocytes Absolute: 0.7 10*3/uL (ref 0.1–1.0)
Monocytes Relative: 7 %
Neutro Abs: 8.2 10*3/uL — ABNORMAL HIGH (ref 1.7–7.7)
Neutrophils Relative %: 81 %
Platelets: 316 10*3/uL (ref 150–400)
RBC: 2.65 MIL/uL — ABNORMAL LOW (ref 3.87–5.11)
RDW: 14.3 % (ref 11.5–15.5)
WBC: 10.1 10*3/uL (ref 4.0–10.5)
nRBC: 0 % (ref 0.0–0.2)

## 2021-03-23 LAB — GLUCOSE, CAPILLARY
Glucose-Capillary: 159 mg/dL — ABNORMAL HIGH (ref 70–99)
Glucose-Capillary: 120 mg/dL — ABNORMAL HIGH (ref 70–99)
Glucose-Capillary: 145 mg/dL — ABNORMAL HIGH (ref 70–99)
Glucose-Capillary: 116 mg/dL — ABNORMAL HIGH (ref 70–99)
Glucose-Capillary: 126 mg/dL — ABNORMAL HIGH (ref 70–99)

## 2021-03-23 LAB — MAGNESIUM: Magnesium: 2.2 mg/dL (ref 1.7–2.4)

## 2021-03-23 MED ORDER — POTASSIUM CHLORIDE CRYS ER 20 MEQ PO TBCR
40.0000 meq | EXTENDED_RELEASE_TABLET | Freq: Once | ORAL | Status: AC
Start: 2021-03-23 — End: 2021-03-23
  Administered 2021-03-23: 40 meq via ORAL
  Filled 2021-03-23: qty 2

## 2021-03-23 NOTE — Progress Notes (Signed)
Chaplain engaged in an initial visit with [name] [name] and her son.  Son expressed wanting to get a healthcare POA, Advanced Directive completed.  Chaplain explained that because he is next of kin, medical staff will reach out to him if [name] [name] is unable to make decisions for herself.  Son explained that his family is close and that they are all amicable.  [name] [name]'s husband is currently dealing with alzheimer's and son lives with them both.  [name] [name]'s daughter is not living in [place] but makes decisions along with her brother.  Son was agreeable that paperwork was not needed.   Son spent some time talking about [name] [name]'s healthcare journey.  Son expressed some frustration around his mom's refusal to do certain things concerning her health such as fully utilize PT.  Chaplain asked [name] [name] if she wanted to continue living and she right away shook her head "yes."  Son believes that [name] [name] has lost her will to live.    Chaplain offered listening, presence and support. Son is caring for both of his parents and his daughter too as a single father.  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  Chaplain noted how that could of been a strain on her.     03/23/21 1400  Clinical Encounter Type  Visited With Patient and family together  Visit Type Initial;Social support  Referral From Family  Consult/Referral To Chaplain

## 2021-03-23 NOTE — Progress Notes (Signed)
Speech Language Pathology Treatment: Dysphagia  Patient Details Name: [name] [name] MRN: [id number] DOB: May 15, 1945 Today's Date: 03/23/2021 Time: 9449-6759 SLP Time Calculation (min) (ACUTE ONLY): 15 min  Assessment / Plan / Recommendation Clinical Impression  Patient seen by SLP for skilled treatment of dysphagia. When SLP entered room, patient awake and alert, looking out in hallway. SLP asked her what she would like and she reported "drinks" but with verbal cues, patient unable to indicate what she wanted. SLP held up two of her gatorade drinks from home and she did not choose a flavor, telling SLP "it doesnt matter". She exhibited significantly delayed responses, appeared uncomfortable in general, telling SLP she felt like she needed to move around a little as she felt uncomfortable in bed. Patient was not able to effectively communicate her needs without mod-maximal assist cues. When drinking consecutive straw sips of thin liquids, she did not exhibit any overt s/s aspiration or pentration. She declined any solids at this time and per her nurse, she has not been eating even when her son was in here. SLP recommending to continue with Dys 3 solids, thin liquids diet. SLP to continue to follow patient briefly for diet toleration.   HPI HPI: Pt is a 76 yo female adm 3 days ago with weakness after being found down by her son.  Pt with PMH + for anxiety, mood disorder - weaned from litihium in 2013, morbidly obese, colon cancer, recurrent falls, recent back pain, decreased po intake, breast cancer.  CT chest showed partial consolidations in the lower lobes could reflect atelectasis or mild pneumonia.  Per PT, pt was given water during her therapy session resulting in pt overtly coughing with intake.  Swallow evaluation was ordered.  She is currently on 3 liters oxygen via nasal cannula.  Swallow eval completed on 03/11/2021 and she was made NPO x ice chips.  Pt was placed on a clear liquid diet following  dysphagia treatment session.  Management of dysphagia during hospital course has been ongoing.      SLP Plan  Continue with current plan of care      Recommendations for follow up therapy are one component of a multi-disciplinary discharge planning process, led by the attending physician.  Recommendations may be updated based on patient status, additional functional criteria and insurance authorization.    Recommendations  Diet recommendations: Thin liquid;Dysphagia 3 (mechanical soft) Liquids provided via: Cup;Straw Medication Administration: Whole meds with puree Supervision: Intermittent supervision to cue for compensatory strategies Compensations: Other (Comment);Slow rate;Small sips/bites Postural Changes and/or Swallow Maneuvers: Seated upright 90 degrees;Upright 30-60 min after meal                Oral Care Recommendations: Oral care BID;Staff/trained caregiver to provide oral care Follow Up Recommendations: Skilled nursing-short term rehab (<3 hours/day) Assistance recommended at discharge: Frequent or constant Supervision/Assistance SLP Visit Diagnosis: Dysphagia, oropharyngeal phase (R13.12) Plan: Continue with current plan of care          Sonia Baller, MA, CCC-SLP Speech Therapy

## 2021-03-23 NOTE — Progress Notes (Signed)
[facility] for Infectious Disease  Date of Admission:  03/08/2021   Total days of inpatient antibiotics 15  Active Problems:   Chronic diastolic heart failure (HCC)   Sepsis (HCC)   Acute renal failure superimposed on stage 3b chronic kidney disease (HCC)   Elevated LFTs   DMII (diabetes mellitus, type 2) (HCC)   Normocytic anemia   Fall at home, initial encounter   Hypothyroidism   MSSA bacteremia   Morbid obesity ([place])   Thrush, oral   Vertebral osteomyelitis ([place])   Abscess   Acute urinary retention          Assessment: 75 YF admitted after sliding off her bed, found to have MSSA bacteremia.  #MSSA bacteremia #Epidural phlegmon/dural thickening -MRI T/L spine on 01/28 showed extensive epidural phlegmon/dural thickening thoracolumbar and left psoas and right paraspinal abscess.  -TTE showed no obvious vegetations -CRP trending down 23(2/1)->8.4(2/4). Leukocytosis resolved. Pt is on cefazolin, nafcillin held to avoid AIN/transaminitis.. -Today 2/6 pt has limited LE movement against gravity.  Recommendations: -Continue cefazolin -CRP today -Would like to get MRI cspine. She continues to decline MRI. I would like to obtain MRI sooner than next week if her LE weakness worsens.   #Thrush -HIV negative, not on inhaled steroids. Risk factor unclear.  -Continue fluconazole x 14d (EOT 03/26/21) Microbiology:   Antibiotics: Azithromycin 1/22 Unasyn 1/23-24 Cefazolin 1/24-p Cefepime 1/22 Ceftriaxone 1/22 Metronidazole and vanc 1/22 Cultures: Blood 1/22 2/2 MSSA 1/23 NG 1/24 NG  Urine 1/22 NG   SUBJECTIVE: Resting in bed. No new complaints.   Review of Systems: Review of Systems  All other systems reviewed and are negative.   Scheduled Meds:  carbamazepine  200 mg Oral QHS   chlorhexidine  15 mL Mouth Rinse BID   Chlorhexidine Gluconate Cloth  6 each Topical Daily   fluconazole  200 mg Oral Daily   furosemide  20 mg Intravenous Daily    heparin  5,000 Units Subcutaneous Q8H   insulin aspart  0-5 Units Subcutaneous QHS   insulin aspart  0-9 Units Subcutaneous TID WC   lactulose  10 g Oral TID   levothyroxine  88 mcg Oral Q0600   mouth rinse  15 mL Mouth Rinse q12n4p   polyethylene glycol  17 g Oral BID   senna-docusate  1 tablet Oral BID   sodium chloride flush  3 mL Intravenous Q12H   Continuous Infusions:   ceFAZolin (ANCEF) IV 2 g (03/23/21 1322)   PRN Meds:.acetaminophen **OR** acetaminophen, bisacodyl, hydrALAZINE, labetalol, lip balm, naphazoline-glycerin, ondansetron (ZOFRAN) IV No Known Allergies  OBJECTIVE: Vitals:   03/22/21 2145 03/23/21 0546 03/23/21 1235 03/23/21 1314  BP: 134/60 101/81  139/68  Pulse: (!) 103 96 96 94  Resp: 20 20 20 20   Temp: 99.1 F (37.3 C) 98 F (36.7 C)  97.6 F (36.4 C)  TempSrc: Oral Oral  Oral  SpO2: 95% 98% 98% 95%  Weight:      Height:       Body mass index is 34.48 kg/m.  Physical Exam Constitutional:      Appearance: Normal appearance.  HENT:     Head: Normocephalic and atraumatic.     Right Ear: Tympanic membrane normal.     Left Ear: Tympanic membrane normal.     Nose: Nose normal.     Mouth/Throat:     Mouth: Mucous membranes are moist.  Eyes:     Extraocular Movements: Extraocular movements intact.  Conjunctiva/sclera: Conjunctivae normal.     Pupils: Pupils are equal, round, and reactive to light.  Cardiovascular:     Rate and Rhythm: Normal rate and regular rhythm.     Heart sounds: No murmur heard.   No friction rub. No gallop.  Pulmonary:     Effort: Pulmonary effort is normal.     Breath sounds: Normal breath sounds.  Abdominal:     General: Abdomen is flat.     Palpations: Abdomen is soft.  Skin:    General: Skin is warm and dry.  Neurological:     General: No focal deficit present.     Mental Status: She is alert and oriented to person, place, and time.  Psychiatric:        Mood and Affect: Mood normal.      Lab Results Lab  Results  Component Value Date   WBC 10.1 03/23/2021   HGB 8.5 (L) 03/23/2021   HCT 26.7 (L) 03/23/2021   MCV 100.8 (H) 03/23/2021   PLT 316 03/23/2021    Lab Results  Component Value Date   CREATININE 1.40 (H) 03/23/2021   BUN 38 (H) 03/23/2021   NA 135 03/23/2021   K 3.3 (L) 03/23/2021   CL 98 03/23/2021   CO2 27 03/23/2021    Lab Results  Component Value Date   ALT 7 03/23/2021   AST 30 03/23/2021   ALKPHOS 69 03/23/2021   BILITOT 0.2 (L) 03/23/2021        Laurice Record, MD [facility] 03/23/2021, 3:11 PM

## 2021-03-23 NOTE — TOC Progression Note (Signed)
Transition of Care [facility]) - Progression Note    Patient Details  Name: [name] [name] MRN: [id number] Date of Birth: [birth year]  [facility] CM/SW Contact  Leeroy Cha, RN Phone Number: 03/23/2021, 9:45 AM  Clinical Narrative:     Plan is for patient to go to [facility] will need auth.  Expected Discharge Plan: [place] Barriers to Discharge: Continued Medical Work up  Expected Discharge Plan and Services Expected Discharge Plan: [place]   Discharge Planning Services: CM Consult   Living arrangements for the past 2 months: Single Family Home                                       Social Determinants of Health (SDOH) Interventions    Readmission Risk Interventions No flowsheet data found.

## 2021-03-23 NOTE — Progress Notes (Signed)
Physical Therapy Treatment Patient Details Name: [name] [name] MRN: [id number] DOB: 1945-06-17 Today's Date: 03/23/2021   History of Present Illness 76 yo female presents to ED on 1/22 with multiple falls, min confusion, hypoxia; workup for sepsis, ARF. CT chest showed bilateral pleural effusions, bilateral lower lobe bronchial wall thickening with consolidations concerning for possible pneumonia, possible 7 mm left apical lung nodule.  recent outpatient Kenalog injection in the left deltoid on 03/06/2021. PMH anxiety, dCHF, HLD, hypothyroidism, IBS, mood disorder, prediabetes, CKD 3b.    PT Comments    Encouragement required for participation. Unsure if pt is having issues with slow processing or if she just isn't very motivated. Worked on sitting balance, sitting tolerance, LE exercises in unsupported sitting. Pt declined standing/transfer on today. Assisted back to bed and set up for lunch. Continue to recommend ST SNF.     Recommendations for follow up therapy are one component of a multi-disciplinary discharge planning process, led by the attending physician.  Recommendations may be updated based on patient status, additional functional criteria and insurance authorization.  Follow Up Recommendations  Skilled nursing-short term rehab (<3 hours/day)     Assistance Recommended at Discharge Frequent or constant Supervision/Assistance  Patient can return home with the following Two people to help with walking and/or transfers;Two people to help with bathing/dressing/bathroom;Help with stairs or ramp for entrance;Assist for transportation;Assistance with cooking/housework   Equipment Recommendations   (TBD at next venue)    Recommendations for Other Services       Precautions / Restrictions Precautions Precautions: Fall Restrictions Weight Bearing Restrictions: No     Mobility  Bed Mobility Overal bed mobility: Needs Assistance Bed Mobility: Supine to Sit, Sit to Supine      Supine to sit: Max assist, +2 for physical assistance, HOB elevated Sit to supine: Max assist, +2 for physical assistance, HOB elevated   General bed mobility comments: Multimodal cueing required. Mod encouragement required. Assist for trunk and LEs. Utilized bedpad for scooting, positioning. Sat EOB to work on static sitting balance, LE exercises, sitting tolerance. Pt declined OOB to chair.    Transfers                   General transfer comment: pt declined to attempt transfer on this date despite enouragement    Ambulation/Gait                   Stairs             Wheelchair Mobility    Modified Rankin (Stroke Patients Only)       Balance Overall balance assessment: Needs assistance, History of Falls Sitting-balance support: Feet supported, Bilateral upper extremity supported Sitting balance-Leahy Scale: Poor Sitting balance - Comments: MOD A, multimodal cues to remain in upright/midline sitting and to use UEs to assist. Pt leaning to the L and R sides attempting to prop                                    Cognition Arousal/Alertness: Awake/alert Behavior During Therapy: Flat affect Overall Cognitive Status: No family/caregiver present to determine baseline cognitive functioning Area of Impairment: Problem solving, Safety/judgement, Following commands, Attention                     Memory: Decreased short-term memory Following Commands: Follows one step commands inconsistently Safety/Judgement: Decreased awareness of safety, Decreased awareness of deficits   Problem  Solving: Slow processing, Decreased initiation, Difficulty sequencing, Requires verbal cues, Requires tactile cues General Comments: pt with negative talk regarding her mobility and becoming more aware of her impairments. pt agreeable to participate after encouragement today.        Exercises General Exercises - Lower Extremity Long Arc Quad: AROM, Both, 5  reps, Seated    General Comments        Pertinent Vitals/Pain Pain Assessment Pain Assessment: Faces Faces Pain Scale: Hurts even more Pain Location: generalized- back, thighs Pain Descriptors / Indicators: Discomfort, Sore, Aching, Grimacing Pain Intervention(s): Limited activity within patient's tolerance, Monitored during session, Repositioned, Patient requesting pain meds-RN notified    Home Living                          Prior Function            PT Goals (current goals can now be found in the care plan section) Progress towards PT goals: Progressing toward goals    Frequency    Min 2X/week      PT Plan      Co-evaluation   Reason for Co-Treatment: Necessary to address cognition/behavior during functional activity;For patient/therapist safety;To address functional/ADL transfers   OT goals addressed during session: ADL's and self-care      AM-PAC PT "6 Clicks" Mobility   Outcome Measure  Help needed turning from your back to your side while in a flat bed without using bedrails?: A Lot Help needed moving from lying on your back to sitting on the side of a flat bed without using bedrails?: Total Help needed moving to and from a bed to a chair (including a wheelchair)?: Total Help needed standing up from a chair using your arms (e.g., wheelchair or bedside chair)?: Total Help needed to walk in hospital room?: Total Help needed climbing 3-5 steps with a railing? : Total 6 Click Score: 7    End of Session Equipment Utilized During Treatment: Oxygen Activity Tolerance: Patient limited by fatigue;Patient limited by pain Patient left: in bed;with call bell/phone within reach;with bed alarm set   PT Visit Diagnosis: Other abnormalities of gait and mobility (R26.89);Muscle weakness (generalized) (M62.81)     Time: 0712-1975 PT Time Calculation (min) (ACUTE ONLY): 28 min  Charges:  $Therapeutic Activity: 8-22 mins                         Doreatha Massed, PT Acute Rehabilitation  Office: [phone] Pager: [phone]

## 2021-03-23 NOTE — Care Management Important Message (Signed)
Important Message  Patient Details IM Letter placed in Patients room. Name: [name] [name] MRN: [id number] Date of Birth: 27-Oct-1945   Medicare Important Message Given:  Yes     Kerin Salen 03/23/2021, 12:51 PM

## 2021-03-23 NOTE — Progress Notes (Signed)
Progress Note   Patient: [name] [name] ZYS:[id number] DOB: 07-09-1945 DOA: 03/08/2021     15 DOS: the patient was seen and examined on 03/23/2021   Brief hospital course: Ms. Paschal is a 76 year old female with PMHhypothyroidism, diastolic CHF, stage IIIb chronic kidney disease and mood disorder who presented to the ER on 03/08/2021 after multiple falls at home.  Although poor historian, patient is noted to have slow decline.  In the emergency room, found to be hypoxic and septic from pneumonia.  Following admission, blood cultures positive for MSSA.   MRI noted widespread enhancement of the lower thoracic and lumbar regions involving the dura as well as surface of conus and cauda equina nerve roots with some subdural collections in the lumbar spine.  Discussed case with neurosurgery and after reviewing studies, they do not see any findings for frank epidural abscess in these regions, and therefore no role for open surgical intervention.  Given enhancement of leptomeninges, neurosurgery recommended infection has spread into CNS and dosing antibiotics for CNS infection recommended.  Plan by infectious disease is to continue on Ancef checking CRP every 72 hours and change antibiotics to nafcillin if CRP worsens or mentation worsens.  Assessment and Plan: Vertebral osteomyelitis ([place])- (present on admission) Appreciate infectious disease and neurosurgery assistance.  For now on IV Ancef.  Follow CRP.  Patient declines TEE however management would not change at this point  - needs repeat MRI spine this week (to include C/T/L this time). Might need to be under anesthesia again. Her affect is too odd still   MSSA bacteremia- (present on admission) - 4/4 bottles positive on admission (1/22) with MSSA. Differential for etiology at this time includes recent outpt steroid injection on 1/20 to Left deltoid vs vertebral infection (worsening lower back pain) vs spontaneous bacteremia vs translocation from  pulmonary source - abx changed to Unasyn which should cover presumed sources for now - obtain MRI left shoulder. ID has ordered MRI T/L spine.  Unfortunately, patient too agitated to stay still, so trying to reorder with general anesthesia.   - TTE on 1/23: some poor images but no obvious vegetations (EF 60-65%, mild LVH).   Blood cultures from 1/24, 1/25 = negative   Sepsis ([place])- (present on admission) - Febrile, tachycardia, tachypnea, leukocytosis; presumed lung source given opacities on CT chest with possible aspiration however now having worsening left shoulder pain (s/p L deltoid kenalog injection on 03/06/21) and ongoing low back pain.  White count has been steadily improving for several days. - Given vancomycin, cefepime, Flagyl in the ER - No significant risk factors for MDRO, then changed to CTX/azithro after blood cultures grew MSSA>> changed to Unasyn, and now ultimately is on Ancef - continue q72 hour CRP check; if worsening CRP or mentation then possibly will be changed to nafcillin for 2 weeks.  ID following, greatly appreciate assistance  Acute renal failure superimposed on stage 3b chronic kidney disease ([place])- (present on admission) - patient has history of CKD3b. Baseline creat ~ 1.3, eGFR 38 -Creatinine on admission at 2.47 and with fluids, had improved, down to 1.45 on 1/29.  Since then, mild increase in today at 1.57.  Questioning if this could be some acute heart failure and cardiorenal disease.  We will stop fluids and give Lasix and follow accordingly. - bilateral hydronephrosis noted on MRI.  Patient has a pure wick with ongoing urine output however abdomen remains distended concerning for urine retention and/or constipation - s/p bladder scan on 2/2 with large PVR;  foley placed with ~3200 cc immediately out in foley - keep foley in place as she's likely so deconditioned it's going to take time for regaining function  Acute respiratory failure with hypoxia ([facility])-resolved  as of 03/17/2021, (present on admission) - Presumed due to underlying pneumonia/aspiration -Patient doing well.  Down to 2 L nasal cannula to 100%.  Attempting to wean off oxygen altogether - See sepsis and bacteremia work-up otherwise - remains on RA  Hyponatremia-resolved as of 03/09/2021, (present on admission) - presumed hypovolemic hyponatremia.  Resolved with IV fluids  Acute urinary retention - Differential for etiology includes severe deconditioning as evidenced by requiring 3+ person assist for bed transfers vs spinal cord involvement due to underlying infection - for now deconditioning higher on differential and foley is now in place as of 2/2 - keep foley in place until patient significantly improved physically and mentally as low chance of passing a voiding trial anytime soon - will follow up repeat MRIs next week to re-evaluate spinal cord involvement as well   Fall at home, initial encounter - Patient found on floor by EMS on their arrival.  Patient states floor is carpeted although she cannot provide any further collateral information regarding her fall or length of time on the floor - Given renal failure on admission which is still likely prerenal, check CK - Multiple imaging studies on admission including right wrist x-ray, CT head, CT cervical spine, and CT chest: All with no acute abnormalities but does have 4 mm anterolisthesis C3 on C4 - continue pain control - continue PT/OT  DMII (diabetes mellitus, type 2) (HCC) - History of prediabetes, no A1c on file - A1c on admission 6.6% - Treat with sliding scale for now and diet control   Chronic diastolic heart failure ([place])- (present on admission) - No S/S exacerbation on exam.  Patient is volume depleted clinically Because of need for D5W, has been getting fluids at 125 cc an hour.  BNP elevated given additional fluids.  Repeat BNP on 1/31 normal.  At this point now, we will stop fluids and start Lasix and follow renal  function appropriately. -Last echo reviewed from 11/06/2020: Moderate MR, impaired relaxation, EF 65% - TTE repeated on 1/23, EF 60-65%, mild LVH; trivial MR on read; normal diastology   Thrush, oral Started on fluconazole.  Slowly improving. - complete 14 day course  Morbid obesity ([place])- (present on admission) Meets criteria for BMI greater than 35+ comorbidities of heart failure and diabetes and hypertension  Hypothyroidism- (present on admission) - TSH, 0.429 - continue Synthroid  Normocytic anemia- (present on admission) - No complaints or signs of bleeding.  No recent lab work other than July 2021 (12.3 g/dL at that time).  Hemoglobin stable since admission.  Elevated LFTs- (present on admission) Suspect secondary to shock liver from sepsis.  Transaminases slowly improving  Hypernatremia-resolved as of 03/14/2021 Due to poor p.o. intake from altered mentation.  Have changed IV fluids to half-normal saline.  Started on D5W after half-normal saline not improving sodium.  Peaking at 157, sodium has since improved with D5W.  Acute metabolic encephalopathy-resolved as of 03/17/2021 - awake but train of thought still intermittently abnormal      Subjective: No events overnight.  Resting in bed in no distress.  Affect still odd.  She was asking if somebody could feed her some of her lunch tray. To me she endorses the need for knowing she needs repeat MRI however seemed to have declined it or been hesitant  when talking with ID.  We will probably need to decide if this needs to be done under anesthesia once again similar to the first time.  Physical Exam: Vitals:   03/22/21 2145 03/23/21 0546 03/23/21 1235 03/23/21 1314  BP: 134/60 101/81  139/68  Pulse: (!) 103 96 96 94  Resp: 20 20 20 20   Temp: 99.1 F (37.3 C) 98 F (36.7 C)  97.6 F (36.4 C)  TempSrc: Oral Oral  Oral  SpO2: 95% 98% 98% 95%  Weight:      Height:      Physical Exam Constitutional:      Comments: More  awake, alert, NAD  HENT:     Head: Normocephalic.     Comments: Bruising noted along right forehead    Mouth/Throat:     Mouth: Mucous membranes are moist.  Eyes:     Extraocular Movements: Extraocular movements intact.  Cardiovascular:     Rate and Rhythm: Normal rate and regular rhythm.     Comments: Subtle 2/6 HSM noted in precordium Pulmonary:     Effort: Pulmonary effort is normal.     Breath sounds: Normal breath sounds.  Abdominal:     General: Bowel sounds are normal.     Tenderness: There is no abdominal tenderness.     Comments: Distention improved and abdomen now soft after Foley placement.  No tenderness.  Musculoskeletal:     Cervical back: Normal range of motion and neck supple.     Comments: Improved ROM in bilateral upper extremities in all joints.  She required a lot of coaching to perform active ROM notably with her bilateral shoulders Overall improved edema notably in upper extremities  Skin:    Comments: Generalized bruising throughout bilateral upper extremities worse in the left  Neurological:     General: No focal deficit present.     Comments: Some slight confusion still appreciated but overall significantly improved  Psychiatric:     Comments: Odd affect     Data Reviewed:  I have Reviewed nursing notes, Vitals, and Lab results since pt's last encounter. Pertinent lab results creat 1.4, CRP 19.6  I have ordered test including BMP, CBC, Mg I have reviewed the last note from all staff over past 24 hours,  I have discussed pt's care plan and test results with nursing staff, CM.   Family Communication: son   Code Status: Full Code  Disposition: Status is: Inpatient Remains inpatient appropriate because: ongoing treatment of osteomyelitis, severe deconditioning and transient altered mentation     Planned Discharge Destination: Skilled nursing facility DVT ppx: HSQ    Author: Dwyane Dee, MD 03/23/2021 4:17 PM  For on call review  [email or web address].

## 2021-03-23 NOTE — Progress Notes (Signed)
Occupational Therapy Treatment Patient Details Name: [name] [name] MRN: [id number] DOB: 10/07/1945 Today's Date: 03/23/2021   History of present illness 76 yo female presents to ED on 1/22 with multiple falls, min confusion, hypoxia; workup for sepsis, ARF. CT chest showed bilateral pleural effusions, bilateral lower lobe bronchial wall thickening with consolidations concerning for possible pneumonia, possible 7 mm left apical lung nodule.  recent outpatient Kenalog injection in the left deltoid on 03/06/2021. PMH anxiety, dCHF, HLD, hypothyroidism, IBS, mood disorder, prediabetes, CKD 3b.   OT comments  Chart reviewed, RN cleared pt for participation in OT tx session. Co-tx completed with PT. Pt is alert and appears to be oriented, however increased processing time is required and step by step multi modal cueing is required for all tasks on this date. Pt also presents with fair attention to task. Tx session targeted progressing functional mobility and ADL task completion. Max A+2 continues to be required for supine<>sit, MOD A for static sitting balance at edge of bed. MOD-MAX A required for all seated ADL tasks.Pt declined STS or transfer to chair on this date. Pt does demonstrate ability to self feed with AE provided in previous tx sessions with SET UP. Overall, fair progress towards goals on this date, will continue to assess. Pt is left as received, NAD, all needs met. OT will continue to follow.    Recommendations for follow up therapy are one component of a multi-disciplinary discharge planning process, led by the attending physician.  Recommendations may be updated based on patient status, additional functional criteria and insurance authorization.    Follow Up Recommendations  Skilled nursing-short term rehab (<3 hours/day)    Assistance Recommended at Discharge Frequent or constant Supervision/Assistance  Patient can return home with the following  Two people to help with walking and/or  transfers;Two people to help with bathing/dressing/bathroom;Assistance with cooking/housework;Assistance with feeding;Direct supervision/assist for medications management;Direct supervision/assist for financial management;Assist for transportation;Help with stairs or ramp for entrance   Equipment Recommendations       Recommendations for Other Services      Precautions / Restrictions Precautions Precautions: Fall Restrictions Weight Bearing Restrictions: No       Mobility Bed Mobility Overal bed mobility: Needs Assistance Bed Mobility: Supine to Sit, Sit to Supine     Supine to sit: Max assist, +2 for physical assistance, HOB elevated Sit to supine: HOB elevated, Max assist, +2 for physical assistance        Transfers                   General transfer comment: pt declined to attempt transfer on this date despite enouragement     Balance Overall balance assessment: Needs assistance Sitting-balance support: Feet supported Sitting balance-Leahy Scale: Poor Sitting balance - Comments: MOD A, step by step verbal cues to remain in upright, midline sitting. Pt leaning to the L. Postural control: Left lateral lean                                 ADL either performed or assessed with clinical judgement   ADL Overall ADL's : Needs assistance/impaired Eating/Feeding: Set up;Cueing for sequencing;With adaptive utensils   Grooming: Moderate assistance;Sitting;Wash/dry hands   Upper Body Bathing: Maximal assistance;Sitting       Upper Body Dressing : Maximal assistance;Sitting                   Functional mobility during ADLs: Maximal  assistance      Extremity/Trunk Assessment              Vision       Perception     Praxis      Cognition Arousal/Alertness: Awake/alert Behavior During Therapy: Flat affect Overall Cognitive Status: No family/caregiver present to determine baseline cognitive functioning Area of Impairment:  Problem solving, Safety/judgement, Following commands, Attention                   Current Attention Level: Focused Memory: Decreased short-term memory Following Commands: Follows one step commands inconsistently Safety/Judgement: Decreased awareness of safety, Decreased awareness of deficits Awareness: Intellectual Problem Solving: Slow processing, Decreased initiation, Difficulty sequencing, Requires verbal cues, Requires tactile cues          Exercises Other Exercises Other Exercises: education re: importance of oob mobility, participation in therapy/ADL tasks. Pt will continue to benefit from ongoign education.    Shoulder Instructions       General Comments      Pertinent Vitals/ Pain       Pain Assessment Pain Assessment: Faces Faces Pain Scale: Hurts little more  Home Living                                          Prior Functioning/Environment              Frequency  Min 2X/week        Progress Toward Goals  OT Goals(current goals can now be found in the care plan section)  Progress towards OT goals: Progressing toward goals  Acute Rehab OT Goals Patient Stated Goal: get stronger OT Goal Formulation: With patient Time For Goal Achievement: 04/06/21 Potential to Achieve Goals: Good  Plan Discharge plan remains appropriate    Co-evaluation    PT/OT/SLP Co-Evaluation/Treatment: Yes Reason for Co-Treatment: Necessary to address cognition/behavior during functional activity;For patient/therapist safety;To address functional/ADL transfers   OT goals addressed during session: ADL's and self-care      AM-PAC OT "6 Clicks" Daily Activity     Outcome Measure   Help from another person eating meals?: A Little Help from another person taking care of personal grooming?: A Lot Help from another person toileting, which includes using toliet, bedpan, or urinal?: Total Help from another person bathing (including washing, rinsing,  drying)?: A Lot Help from another person to put on and taking off regular upper body clothing?: A Lot Help from another person to put on and taking off regular lower body clothing?: Total 6 Click Score: 11    End of Session    OT Visit Diagnosis: Unsteadiness on feet (R26.81);Other abnormalities of gait and mobility (R26.89);Muscle weakness (generalized) (M62.81);Other symptoms and signs involving cognitive function   Activity Tolerance Patient limited by fatigue   Patient Left in bed;with call bell/phone within reach;with bed alarm set   Nurse Communication Mobility status (PT called and requested pain meds per patient request)        Time: 1123-1150    Charges: OT General Charges $OT Visit: 1 Visit OT Treatments $Self Care/Home Management : 8-22 mins  Shanon Payor, OTD OTR/L  03/23/21, 12:55 PM

## 2021-03-24 LAB — BASIC METABOLIC PANEL
Anion gap: 9 (ref 5–15)
BUN: 39 mg/dL — ABNORMAL HIGH (ref 8–23)
CO2: 27 mmol/L (ref 22–32)
Calcium: 9.2 mg/dL (ref 8.9–10.3)
Chloride: 103 mmol/L (ref 98–111)
Creatinine, Ser: 1.34 mg/dL — ABNORMAL HIGH (ref 0.44–1.00)
GFR, Estimated: 41 mL/min — ABNORMAL LOW (ref >60)
Glucose, Bld: 131 mg/dL — ABNORMAL HIGH (ref 70–99)
Potassium: 4.2 mmol/L (ref 3.5–5.1)
Sodium: 139 mmol/L (ref 135–145)

## 2021-03-24 LAB — CBC WITH DIFFERENTIAL/PLATELET
Abs Immature Granulocytes: 0.2 10*3/uL — ABNORMAL HIGH (ref 0.00–0.07)
Basophils Absolute: 0 10*3/uL (ref 0.0–0.1)
Basophils Relative: 0 %
Eosinophils Absolute: 0.2 10*3/uL (ref 0.0–0.5)
Eosinophils Relative: 3 %
HCT: 28.6 % — ABNORMAL LOW (ref 36.0–46.0)
Hemoglobin: 9.1 g/dL — ABNORMAL LOW (ref 12.0–15.0)
Immature Granulocytes: 3 %
Lymphocytes Relative: 14 %
Lymphs Abs: 1.1 10*3/uL (ref 0.7–4.0)
MCH: 32.2 pg (ref 26.0–34.0)
MCHC: 31.8 g/dL (ref 30.0–36.0)
MCV: 101.1 fL — ABNORMAL HIGH (ref 80.0–100.0)
Monocytes Absolute: 0.6 10*3/uL (ref 0.1–1.0)
Monocytes Relative: 8 %
Neutro Abs: 5.9 10*3/uL (ref 1.7–7.7)
Neutrophils Relative %: 72 %
Platelets: 335 10*3/uL (ref 150–400)
RBC: 2.83 MIL/uL — ABNORMAL LOW (ref 3.87–5.11)
RDW: 14.2 % (ref 11.5–15.5)
WBC: 8.1 10*3/uL (ref 4.0–10.5)
nRBC: 0 % (ref 0.0–0.2)

## 2021-03-24 LAB — GLUCOSE, CAPILLARY
Glucose-Capillary: 121 mg/dL — ABNORMAL HIGH (ref 70–99)
Glucose-Capillary: 259 mg/dL — ABNORMAL HIGH (ref 70–99)
Glucose-Capillary: 275 mg/dL — ABNORMAL HIGH (ref 70–99)
Glucose-Capillary: 122 mg/dL — ABNORMAL HIGH (ref 70–99)

## 2021-03-24 LAB — MAGNESIUM: Magnesium: 2.4 mg/dL (ref 1.7–2.4)

## 2021-03-24 LAB — C-REACTIVE PROTEIN: CRP: 4 mg/dL — ABNORMAL HIGH (ref <1.0)

## 2021-03-24 NOTE — Progress Notes (Signed)
Progress Note   Patient: [name] [name] YHC:[id number] DOB: March 17, 1945 DOA: 03/08/2021     16 DOS: the patient was seen and examined on 03/24/2021   Brief hospital course: Ms. Bieda is a 76 year old female with PMHhypothyroidism, diastolic CHF, stage IIIb chronic kidney disease and mood disorder who presented to the ER on 03/08/2021 after multiple falls at home.  Although poor historian, patient is noted to have slow decline.  In the emergency room, found to be hypoxic and septic from pneumonia.  Following admission, blood cultures positive for MSSA.   MRI noted widespread enhancement of the lower thoracic and lumbar regions involving the dura as well as surface of conus and cauda equina nerve roots with some subdural collections in the lumbar spine.  Discussed case with neurosurgery and after reviewing studies, they do not see any findings for frank epidural abscess in these regions, and therefore no role for open surgical intervention.  Given enhancement of leptomeninges, neurosurgery recommended infection has spread into CNS and dosing antibiotics for CNS infection recommended.  Plan by infectious disease is to continue on Ancef checking CRP every 72 hours and change antibiotics to nafcillin if CRP worsens or mentation worsens.  Assessment and Plan: Vertebral osteomyelitis ([place])- (present on admission) Appreciate infectious disease and neurosurgery assistance.  For now on IV Ancef.  Follow CRP.  Patient declines TEE however management would not change at this point  - needs repeat MRI spine this week (to include C/T/L this time). Might need to be under anesthesia again. Her affect is too odd still   MSSA bacteremia- (present on admission) - 4/4 bottles positive on admission (1/22) with MSSA. Differential for etiology at this time includes recent outpt steroid injection on 1/20 to Left deltoid vs vertebral infection (worsening lower back pain) vs spontaneous bacteremia vs translocation from  pulmonary source - abx changed to Unasyn which should cover presumed sources for now - obtain MRI left shoulder. ID has ordered MRI T/L spine.  Unfortunately, patient too agitated to stay still, so trying to reorder with general anesthesia.   - TTE on 1/23: some poor images but no obvious vegetations (EF 60-65%, mild LVH).   Blood cultures from 1/24, 1/25 = negative   Sepsis ([place])- (present on admission) - Febrile, tachycardia, tachypnea, leukocytosis; presumed lung source given opacities on CT chest with possible aspiration however now having worsening left shoulder pain (s/p L deltoid kenalog injection on 03/06/21) and ongoing low back pain.  White count has been steadily improving for several days. - Given vancomycin, cefepime, Flagyl in the ER - No significant risk factors for MDRO, then changed to CTX/azithro after blood cultures grew MSSA>> changed to Unasyn, and now ultimately is on Ancef - continue q72 hour CRP check; if worsening CRP or mentation then possibly will be changed to nafcillin for 2 weeks.  ID following, greatly appreciate assistance  Acute renal failure superimposed on stage 3b chronic kidney disease ([place])- (present on admission) - patient has history of CKD3b. Baseline creat ~ 1.3, eGFR 38 -Creatinine on admission at 2.47 and with fluids, had improved, down to 1.45 on 1/29.  Since then, mild increase in today at 1.57.  Questioning if this could be some acute heart failure and cardiorenal disease.  We will stop fluids and give Lasix and follow accordingly. - bilateral hydronephrosis noted on MRI.  Patient has a pure wick with ongoing urine output however abdomen remains distended concerning for urine retention and/or constipation - s/p bladder scan on 2/2 with large PVR;  foley placed with ~3200 cc immediately out in foley - keep foley in place as she's likely so deconditioned it's going to take time for regaining function  Acute respiratory failure with hypoxia (HCC)-resolved  as of 03/17/2021, (present on admission) - Presumed due to underlying pneumonia/aspiration -Patient doing well.  Down to 2 L nasal cannula to 100%.  Attempting to wean off oxygen altogether - See sepsis and bacteremia work-up otherwise - remains on RA  Hyponatremia-resolved as of 03/09/2021, (present on admission) - presumed hypovolemic hyponatremia.  Resolved with IV fluids  Acute urinary retention - Differential for etiology includes severe deconditioning as evidenced by requiring 3+ person assist for bed transfers vs spinal cord involvement due to underlying infection - for now deconditioning higher on differential and foley is now in place as of 2/2 - keep foley in place until patient significantly improved physically and mentally as low chance of passing a voiding trial anytime soon - will follow up repeat MRIs next week to re-evaluate spinal cord involvement as well   Fall at home, initial encounter - Patient found on floor by EMS on their arrival.  Patient states floor is carpeted although she cannot provide any further collateral information regarding her fall or length of time on the floor - Given renal failure on admission which is still likely prerenal, check CK - Multiple imaging studies on admission including right wrist x-ray, CT head, CT cervical spine, and CT chest: All with no acute abnormalities but does have 4 mm anterolisthesis C3 on C4 - continue pain control - continue PT/OT  DMII (diabetes mellitus, type 2) (HCC) - History of prediabetes, no A1c on file - A1c on admission 6.6% - Treat with sliding scale for now and diet control   Chronic diastolic heart failure ([place])- (present on admission) - No S/S exacerbation on exam.  Patient is volume depleted clinically Because of need for D5W, has been getting fluids at 125 cc an hour.  BNP elevated given additional fluids.  Repeat BNP on 1/31 normal.  At this point now, we will stop fluids and start Lasix and follow renal  function appropriately. -Last echo reviewed from 11/06/2020: Moderate MR, impaired relaxation, EF 65% - TTE repeated on 1/23, EF 60-65%, mild LVH; trivial MR on read; normal diastology   Thrush, oral Started on fluconazole.  Slowly improving. - complete 14 day course  Morbid obesity ([place])- (present on admission) Meets criteria for BMI greater than 35+ comorbidities of heart failure and diabetes and hypertension  Hypothyroidism- (present on admission) - TSH, 0.429 - continue Synthroid  Normocytic anemia- (present on admission) - No complaints or signs of bleeding.  No recent lab work other than July 2021 (12.3 g/dL at that time).  Hemoglobin stable since admission.  Elevated LFTs- (present on admission) Suspect secondary to shock liver from sepsis.  Transaminases slowly improving  Hypernatremia-resolved as of 03/14/2021 Due to poor p.o. intake from altered mentation.  Have changed IV fluids to half-normal saline.  Started on D5W after half-normal saline not improving sodium.  Peaking at 157, sodium has since improved with D5W.  Acute metabolic encephalopathy-resolved as of 03/17/2021 - awake but train of thought still intermittently abnormal      Subjective: No events overnight.  Still with flat affect as usual.  No other issues this morning.  Pain relatively controlled.  Physical Exam: Vitals:   03/23/21 1314 03/23/21 2112 03/24/21 0617 03/24/21 1122  BP: 139/68 126/61 (!) 142/66 121/60  Pulse: 94 91 84 (!) 105  Resp: 20 20 18 20   Temp: 97.6 F (36.4 C) 98.1 F (36.7 C) 98.4 F (36.9 C) 98.2 F (36.8 C)  TempSrc: Oral Oral Oral Oral  SpO2: 95% 96% 98% 96%  Weight:      Height:      Physical Exam Constitutional:      Comments: More awake, alert, NAD  HENT:     Head: Normocephalic.     Comments: Bruising noted along right forehead    Mouth/Throat:     Mouth: Mucous membranes are moist.  Eyes:     Extraocular Movements: Extraocular movements intact.   Cardiovascular:     Rate and Rhythm: Normal rate and regular rhythm.     Comments: Subtle 2/6 HSM noted in precordium Pulmonary:     Effort: Pulmonary effort is normal.     Breath sounds: Normal breath sounds.  Abdominal:     General: Bowel sounds are normal.     Tenderness: There is no abdominal tenderness.     Comments: Distention improved and abdomen now soft after Foley placement.  No tenderness.  Musculoskeletal:     Cervical back: Normal range of motion and neck supple.     Comments: Improved ROM in bilateral upper extremities in all joints. Overall improved edema notably in upper extremities  Skin:    Comments: Generalized bruising throughout bilateral upper extremities worse in the left  Neurological:     General: No focal deficit present.     Comments: Some slight confusion still appreciated but overall significantly improved  Psychiatric:     Comments: Odd and flat affect     Data Reviewed:  I have Reviewed nursing notes, Vitals, and Lab results since pt's last encounter. Pertinent lab results creat 1.34, CRP 4  I have ordered test including BMP, CBC, Mg I have reviewed the last note from all staff over past 24 hours,  I have discussed pt's care plan and test results with nursing staff, CM.   Family Communication: son   Code Status: Full Code  Disposition: Status is: Inpatient Remains inpatient appropriate because: ongoing treatment of osteomyelitis, severe deconditioning and transient altered mentation     Planned Discharge Destination: Skilled nursing facility DVT ppx: HSQ    Author: Dwyane Dee, MD 03/24/2021 2:21 PM  For on call review [email or web address].

## 2021-03-25 DIAGNOSIS — R531 Weakness: Secondary | ICD-10-CM

## 2021-03-25 LAB — BASIC METABOLIC PANEL
Anion gap: 11 (ref 5–15)
BUN: 40 mg/dL — ABNORMAL HIGH (ref 8–23)
CO2: 26 mmol/L (ref 22–32)
Calcium: 9.5 mg/dL (ref 8.9–10.3)
Chloride: 100 mmol/L (ref 98–111)
Creatinine, Ser: 1.35 mg/dL — ABNORMAL HIGH (ref 0.44–1.00)
GFR, Estimated: 41 mL/min — ABNORMAL LOW (ref >60)
Glucose, Bld: 168 mg/dL — ABNORMAL HIGH (ref 70–99)
Potassium: 4.2 mmol/L (ref 3.5–5.1)
Sodium: 137 mmol/L (ref 135–145)

## 2021-03-25 LAB — CBC WITH DIFFERENTIAL/PLATELET
Abs Immature Granulocytes: 0.43 10*3/uL — ABNORMAL HIGH (ref 0.00–0.07)
Basophils Absolute: 0.1 10*3/uL (ref 0.0–0.1)
Basophils Relative: 1 %
Eosinophils Absolute: 0.2 10*3/uL (ref 0.0–0.5)
Eosinophils Relative: 2 %
HCT: 31.1 % — ABNORMAL LOW (ref 36.0–46.0)
Hemoglobin: 10 g/dL — ABNORMAL LOW (ref 12.0–15.0)
Immature Granulocytes: 5 %
Lymphocytes Relative: 14 %
Lymphs Abs: 1.2 10*3/uL (ref 0.7–4.0)
MCH: 32.1 pg (ref 26.0–34.0)
MCHC: 32.2 g/dL (ref 30.0–36.0)
MCV: 99.7 fL (ref 80.0–100.0)
Monocytes Absolute: 0.6 10*3/uL (ref 0.1–1.0)
Monocytes Relative: 6 %
Neutro Abs: 6.4 10*3/uL (ref 1.7–7.7)
Neutrophils Relative %: 72 %
Platelets: 245 10*3/uL (ref 150–400)
RBC: 3.12 MIL/uL — ABNORMAL LOW (ref 3.87–5.11)
RDW: 14.4 % (ref 11.5–15.5)
WBC: 8.9 10*3/uL (ref 4.0–10.5)
nRBC: 0 % (ref 0.0–0.2)

## 2021-03-25 LAB — GLUCOSE, CAPILLARY
Glucose-Capillary: 122 mg/dL — ABNORMAL HIGH (ref 70–99)
Glucose-Capillary: 335 mg/dL — ABNORMAL HIGH (ref 70–99)
Glucose-Capillary: 78 mg/dL (ref 70–99)
Glucose-Capillary: 123 mg/dL — ABNORMAL HIGH (ref 70–99)

## 2021-03-25 LAB — MAGNESIUM: Magnesium: 2.3 mg/dL (ref 1.7–2.4)

## 2021-03-25 MED ORDER — HYDROCORTISONE ACETATE 25 MG RE SUPP
25.0000 mg | Freq: Two times a day (BID) | RECTAL | Status: DC
  Filled 2021-03-25: qty 1

## 2021-03-25 MED ORDER — KETOROLAC TROMETHAMINE 15 MG/ML IJ SOLN
15.0000 mg | Freq: Once | INTRAMUSCULAR | Status: AC
  Administered 2021-03-25: 15 mg via INTRAVENOUS
  Filled 2021-03-25: qty 1

## 2021-03-25 MED ORDER — HYDROCORTISONE (PERIANAL) 2.5 % EX CREA
TOPICAL_CREAM | Freq: Two times a day (BID) | CUTANEOUS | Status: DC
  Administered 2021-03-30 – 2021-04-01 (×2): 1 via TOPICAL
  Filled 2021-03-25 (×2): qty 28.35

## 2021-03-25 NOTE — Progress Notes (Signed)
Progress Note   Patient: [name] [name] WUJ:[id number] DOB: [birth year] DOA: 03/08/2021     17 DOS: the patient was seen and examined on 03/25/2021   Brief hospital course: [name] [name] is a 76 year old female with PMHhypothyroidism, diastolic CHF, stage IIIb chronic kidney disease and mood disorder who presented to the ER on 03/08/2021 after multiple falls at home.  Although poor historian, patient is noted to have slow decline.  In the emergency room, found to be hypoxic and septic from pneumonia.  Following admission, blood cultures positive for MSSA.   MRI noted widespread enhancement of the lower thoracic and lumbar regions involving the dura as well as surface of conus and cauda equina nerve roots with some subdural collections in the lumbar spine.  Discussed case with neurosurgery and after reviewing studies, they do not see any findings for frank epidural abscess in these regions, and therefore no role for open surgical intervention.  Given enhancement of leptomeninges, neurosurgery recommended infection has spread into CNS and dosing antibiotics for CNS infection recommended.  Plan by infectious disease is to continue on Ancef checking CRP every 72 hours and change antibiotics to nafcillin if CRP worsens or mentation worsens.    Assessment and Plan: Acute urinary retention - Differential for etiology includes severe deconditioning as evidenced by requiring 3+ person assist for bed transfers vs spinal cord involvement due to underlying infection - for now deconditioning higher on differential and foley is now in place as of 2/2 - keep foley in place until patient significantly improved physically and mentally as low chance of passing a voiding trial anytime soon - will follow up repeat MRIs next week to re-evaluate spinal cord involvement as well   Vertebral osteomyelitis ([name])- (present on admission) Appreciate infectious disease and neurosurgery assistance.  For now on IV Ancef.  Follow CRP  much improved at 4.  Patient declines TEE however management would not change at this point  - needs repeat MRI spine this week (to include C/T/L this time). Might need to be under anesthesia again. Her affect is too odd still   Thrush, oral Started on fluconazole.  Slowly improving. - complete 14 day course  Morbid obesity ([name])- (present on admission) Meets criteria for BMI greater than 35+ comorbidities of heart failure and diabetes and hypertension  MSSA bacteremia- (present on admission) - 4/4 bottles positive on admission (1/22) with MSSA. Differential for etiology at this time includes recent outpt steroid injection on 1/20 to Left deltoid vs vertebral infection (worsening lower back pain) vs spontaneous bacteremia vs translocation from pulmonary source - abx changed to Unasyn which should cover presumed sources for now - obtain MRI left shoulder. ID has ordered MRI T/L spine.  Unfortunately, patient too agitated to stay still, so trying to reorder with general anesthesia.   - TTE on 1/23: some poor images but no obvious vegetations (EF 60-65%, mild LVH).   Blood cultures from 1/24, 1/25 = negative   Hypothyroidism- (present on admission) - TSH, 0.429 - continue Synthroid  Fall at home, initial encounter - Patient found on floor by EMS on their arrival.  Patient states floor is carpeted although she cannot provide any further collateral information regarding her fall or length of time on the floor - Given renal failure on admission which is still likely prerenal, check CK - Multiple imaging studies on admission including right wrist x-ray, CT head, CT cervical spine, and CT chest: All with no acute abnormalities but does have 4 mm anterolisthesis C3 on  C4 - continue pain control - continue PT/OT  Normocytic anemia- (present on admission) - No complaints or signs of bleeding.  No recent lab work other than July 2021 (12.3 g/dL at that time).  Hemoglobin stable since  admission.  DMII (diabetes mellitus, type 2) (HCC) - History of prediabetes, no A1c on file - A1c on admission 6.6% - Treat with sliding scale for now and diet control   Elevated LFTs- (present on admission) Suspect secondary to shock liver from sepsis.  Transaminases slowly improving  Acute renal failure superimposed on stage 3b chronic kidney disease ([name])- (present on admission) - patient has history of CKD3b. Baseline creat ~ 1.3, eGFR 38 -Creatinine on admission at 2.47 and with fluids, had improved, down to 1.45 on 1/29.  Since then, mild increase in today at 1.57.  Questioning if this could be some acute heart failure and cardiorenal disease.  We will stop fluids and give Lasix and follow accordingly. - bilateral hydronephrosis noted on MRI.  Patient has a pure wick with ongoing urine output however abdomen remains distended concerning for urine retention and/or constipation - s/p bladder scan on 2/2 with large PVR; foley placed with ~3200 cc immediately out in foley - keep foley in place as she's likely so deconditioned it's going to take time for regaining function  Sepsis ([name])- (present on admission) - Febrile, tachycardia, tachypnea, leukocytosis; presumed lung source given opacities on CT chest with possible aspiration however now having worsening left shoulder pain (s/p L deltoid kenalog injection on 03/06/21) and ongoing low back pain.  White count has been steadily improving for several days. - Given vancomycin, cefepime, Flagyl in the ER - No significant risk factors for MDRO, then changed to CTX/azithro after blood cultures grew MSSA>> changed to Unasyn, and now ultimately is on Ancef - continue q72 hour CRP check; if worsening CRP or mentation then possibly will be changed to nafcillin for 2 weeks.  ID following, greatly appreciate assistance  Chronic diastolic heart failure ([name])- (present on admission) - No S/S exacerbation on exam.  Patient is volume depleted  clinically Because of need for D5W, has been getting fluids at 125 cc an hour.  BNP elevated given additional fluids.  Repeat BNP on 1/31 normal.  At this point now, we will stop fluids and start Lasix and follow renal function appropriately. -Last echo reviewed from 11/06/2020: Moderate MR, impaired relaxation, EF 65% - TTE repeated on 1/23, EF 60-65%, mild LVH; trivial MR on read; normal diastology   Hypernatremia-resolved as of 03/14/2021 Due to poor p.o. intake from altered mentation.  Have changed IV fluids to half-normal saline.  Started on D5W after half-normal saline not improving sodium.  Peaking at 157, sodium has since improved with D5W.  Acute metabolic encephalopathy-resolved as of 03/17/2021 - awake but train of thought still intermittently abnormal  Acute respiratory failure with hypoxia (HCC)-resolved as of 03/17/2021, (present on admission) - Presumed due to underlying pneumonia/aspiration -Patient doing well.  Down to 2 L nasal cannula to 100%.  Attempting to wean off oxygen altogether - See sepsis and bacteremia work-up otherwise - remains on RA  Hyponatremia-resolved as of 03/09/2021, (present on admission) - presumed hypovolemic hyponatremia.  Resolved with IV fluids   Subjective: Answered questions at bedside with son at bedside.  No complaints.  Physical Exam: Vitals:   03/24/21 0617 03/24/21 1122 03/24/21 1946 03/25/21 0646  BP: (!) 142/66 121/60 (!) 125/50 (!) 122/51  Pulse: 84 (!) 105 96 97  Resp: 18 20  Temp: 98.4 °F (36.9 °C) 98.2 °F (36.8 °C) 97.9 °F (36.6 °C) 98.4 °F (36.9 °C)  °TempSrc: Oral Oral Oral Oral  °SpO2: 98% 96% 97% 94%  °Weight:      °Height:      °Physical Exam °Constitutional:   °   Comments: More awake, alert, NAD  °HENT:  °   Head: Normocephalic.  °   Comments: Bruising noted along right forehead °   Mouth/Throat:  °   Mouth: Mucous membranes are moist.  °Eyes:  °   Extraocular Movements: Extraocular movements intact.  °Cardiovascular:  °    Rate and Rhythm: Normal rate and regular rhythm.  °   Comments: Subtle 2/6 HSM noted in precordium °Pulmonary:  °   Effort: Pulmonary effort is normal.  °   Breath sounds: Normal breath sounds.  °Abdominal:  °   General: Bowel sounds are normal.  °   Tenderness: There is no abdominal tenderness.  °   Comments: Distention improved and abdomen now soft after Foley placement.  No tenderness.  °Musculoskeletal:  °   Cervical back: Normal range of motion and neck supple.  °   Comments: Improved ROM in bilateral upper extremities in all joints. Overall improved edema notably in upper extremities  °Skin: °   Comments: Generalized bruising throughout bilateral upper extremities worse in the left  °Neurological:  °   General: No focal deficit present.  °   Comments: Some slight confusion still appreciated but overall significantly improved  °Psychiatric:  °   Comments: Odd and flat affect  ° ° ° °Data Reviewed: ° °I have Reviewed nursing notes, Vitals, and Lab results since pt's last encounter. Pertinent lab results creat 1.34, CRP 4  °I have ordered test including BMP, CBC, Mg °I have reviewed the last note from all staff over past 24 hours,  °I have discussed pt's care plan and test results with nursing staff, CM. ° ° °Family Communication: son °  Code Status: Full Code ° °Disposition: °Status is: Inpatient °Remains inpatient appropriate because: ongoing treatment of osteomyelitis, severe deconditioning and transient altered mentation  ° ° ° Planned Discharge Destination: Skilled nursing facility °Author: °, A, MD °03/25/2021 11:26 AM ° ° °

## 2021-03-26 DIAGNOSIS — M4620 Osteomyelitis of vertebra, site unspecified: Secondary | ICD-10-CM | POA: Diagnosis not present

## 2021-03-26 DIAGNOSIS — R7881 Bacteremia: Secondary | ICD-10-CM | POA: Diagnosis not present

## 2021-03-26 DIAGNOSIS — B370 Candidal stomatitis: Secondary | ICD-10-CM | POA: Diagnosis not present

## 2021-03-26 DIAGNOSIS — L0291 Cutaneous abscess, unspecified: Secondary | ICD-10-CM | POA: Diagnosis not present

## 2021-03-26 LAB — CBC WITH DIFFERENTIAL/PLATELET
Abs Immature Granulocytes: 0.2 10*3/uL — ABNORMAL HIGH (ref 0.00–0.07)
Basophils Absolute: 0 10*3/uL (ref 0.0–0.1)
Basophils Relative: 0 %
Eosinophils Absolute: 0 10*3/uL (ref 0.0–0.5)
Eosinophils Relative: 0 %
HCT: 28.6 % — ABNORMAL LOW (ref 36.0–46.0)
Hemoglobin: 9.1 g/dL — ABNORMAL LOW (ref 12.0–15.0)
Lymphocytes Relative: 7 %
Lymphs Abs: 0.6 10*3/uL — ABNORMAL LOW (ref 0.7–4.0)
MCH: 32.5 pg (ref 26.0–34.0)
MCHC: 31.8 g/dL (ref 30.0–36.0)
MCV: 102.1 fL — ABNORMAL HIGH (ref 80.0–100.0)
Monocytes Absolute: 0.1 10*3/uL (ref 0.1–1.0)
Monocytes Relative: 1 %
Myelocytes: 3 %
Neutro Abs: 7.3 10*3/uL (ref 1.7–7.7)
Neutrophils Relative %: 89 %
Platelets: 301 10*3/uL (ref 150–400)
RBC: 2.8 MIL/uL — ABNORMAL LOW (ref 3.87–5.11)
RDW: 14.3 % (ref 11.5–15.5)
WBC: 8.2 10*3/uL (ref 4.0–10.5)
nRBC: 0 % (ref 0.0–0.2)

## 2021-03-26 LAB — BASIC METABOLIC PANEL
Anion gap: 8 (ref 5–15)
BUN: 50 mg/dL — ABNORMAL HIGH (ref 8–23)
CO2: 27 mmol/L (ref 22–32)
Calcium: 9.2 mg/dL (ref 8.9–10.3)
Chloride: 100 mmol/L (ref 98–111)
Creatinine, Ser: 1.47 mg/dL — ABNORMAL HIGH (ref 0.44–1.00)
GFR, Estimated: 37 mL/min — ABNORMAL LOW (ref >60)
Glucose, Bld: 110 mg/dL — ABNORMAL HIGH (ref 70–99)
Potassium: 4.4 mmol/L (ref 3.5–5.1)
Sodium: 135 mmol/L (ref 135–145)

## 2021-03-26 LAB — GLUCOSE, CAPILLARY
Glucose-Capillary: 200 mg/dL — ABNORMAL HIGH (ref 70–99)
Glucose-Capillary: 145 mg/dL — ABNORMAL HIGH (ref 70–99)
Glucose-Capillary: 99 mg/dL (ref 70–99)
Glucose-Capillary: 126 mg/dL — ABNORMAL HIGH (ref 70–99)

## 2021-03-26 LAB — MAGNESIUM: Magnesium: 2.5 mg/dL — ABNORMAL HIGH (ref 1.7–2.4)

## 2021-03-26 MED ORDER — GLUCERNA SHAKE PO LIQD
237.0000 mL | Freq: Three times a day (TID) | ORAL | Status: DC
  Administered 2021-03-26 – 2021-04-03 (×22): 237 mL via ORAL
  Filled 2021-03-26 (×25): qty 237

## 2021-03-26 NOTE — TOC Progression Note (Signed)
Transition of Care [facility]) - Progression Note    Patient Details  Name: [name] [name] MRN: [id number] Date of Birth: 05-23-1945  [facility]) CM/SW Contact  Leeroy Cha, RN Phone Number: 03/26/2021, 8:16 AM  Clinical Narrative:    Chart and notes reviewed.  No toc needs at this time.  Will go to [facility] when more stable and ready.   Expected Discharge Plan: [place] Barriers to Discharge: Continued Medical Work up  Expected Discharge Plan and Services Expected Discharge Plan: [place]   Discharge Planning Services: CM Consult   Living arrangements for the past 2 months: Single Family Home                                       Social Determinants of Health (SDOH) Interventions    Readmission Risk Interventions No flowsheet data found.

## 2021-03-26 NOTE — Care Management Important Message (Signed)
Important Message  Patient Details IM Letter placed in Patients room. Name: [name] [name] MRN: [id number] Date of Birth: 12-14-1945   Medicare Important Message Given:  Yes     Kerin Salen 03/26/2021, 12:57 PM

## 2021-03-26 NOTE — Progress Notes (Signed)
Progress Note   Patient: [name] [name] DSK:[id number] DOB: 1945/03/18 DOA: 03/08/2021     18 DOS: the patient was seen and examined on 03/26/2021   Brief hospital course: Ms. Slight is a 76 year old female with PMHhypothyroidism, diastolic CHF, stage IIIb chronic kidney disease and mood disorder who presented to the ER on 03/08/2021 after multiple falls at home.  Although poor historian, patient is noted to have slow decline.  In the emergency room, found to be hypoxic and septic from pneumonia.  Following admission, blood cultures positive for MSSA.   MRI noted widespread enhancement of the lower thoracic and lumbar regions involving the dura as well as surface of conus and cauda equina nerve roots with some subdural collections in the lumbar spine.  Discussed case with neurosurgery and after reviewing studies, they do not see any findings for frank epidural abscess in these regions, and therefore no role for open surgical intervention.  Given enhancement of leptomeninges, neurosurgery recommended infection has spread into CNS and dosing antibiotics for CNS infection recommended.  Plan by infectious disease is to continue on Ancef checking CRP every 72 hours and change antibiotics to nafcillin if CRP worsens or mentation worsens.    Assessment and Plan: Acute urinary retention - Differential for etiology includes severe deconditioning as evidenced by requiring 3+ person assist for bed transfers vs spinal cord involvement due to underlying infection - for now deconditioning higher on differential and foley is now in place as of 2/2 - keep foley in place until patient significantly improved physically and mentally as low chance of passing a voiding trial anytime soon - will follow up repeat MRIs next week to re-evaluate spinal cord involvement as well per ID  Vertebral osteomyelitis ([place])- (present on admission) Appreciate infectious disease and neurosurgery assistance.  For now on IV Ancef.   Follow CRP much improved at 4.  Patient declines TEE however management would not change at this point  - needs repeat MRI spine this week (to include C/T/L this time). Might need to be under anesthesia again. anesthesia has been consulted.  Timing per ID team.  Thrush, oral Started on fluconazole.  Slowly improving. - complete 14 day course  Morbid obesity ([place])- (present on admission) Meets criteria for BMI greater than 35+ comorbidities of heart failure and diabetes and hypertension  MSSA bacteremia- (present on admission) - 4/4 bottles positive on admission (1/22) with MSSA. Differential for etiology at this time includes recent outpt steroid injection on 1/20 to Left deltoid vs vertebral infection (worsening lower back pain) vs spontaneous bacteremia vs translocation from pulmonary source -Ancef IV - obtain MRI left shoulder. ID has ordered MRI T/L spine.  Unfortunately, patient too agitated to stay still - TTE on 1/23: some poor images but no obvious vegetations (EF 60-65%, mild LVH).   Blood cultures from 1/24, 1/25 = negative   Hypothyroidism- (present on admission) - TSH, 0.429 - continue Synthroid  Fall at home, initial encounter - Patient found on floor by EMS on their arrival.  Patient states floor is carpeted although she cannot provide any further collateral information regarding her fall or length of time on the floor - Given renal failure on admission which is still likely prerenal - Multiple imaging studies on admission including right wrist x-ray, CT head, CT cervical spine, and CT chest: All with no acute abnormalities but does have 4 mm anterolisthesis C3 on C4 - continue pain control - continue PT/OT  Normocytic anemia- (present on admission) - No complaints or signs  of bleeding.  No recent lab work other than July 2021 (12.3 g/dL at that time).  Hemoglobin stable since admission.  DMII (diabetes mellitus, type 2) (HCC) - History of prediabetes, no A1c on file -  A1c on admission 6.6% - Treat with sliding scale for now and diet control   Elevated LFTs- (present on admission) Suspect secondary to shock liver from sepsis.  Transaminases slowly improving  Acute renal failure superimposed on stage 3b chronic kidney disease ([place])- (present on admission) - patient has history of CKD3b. Baseline creat ~ 1.3, eGFR 38 -Creatinine on admission at 2.47 and with fluids, had improved, down to 1.45 on 1/29.  Since then, mild increase in today at 1.57.  Questioning if this could be some acute heart failure and cardiorenal disease.  We will stop fluids and give Lasix and follow accordingly. - bilateral hydronephrosis noted on MRI.  Patient has a pure wick with ongoing urine output however abdomen remains distended concerning for urine retention and/or constipation - s/p bladder scan on 2/2 with large PVR; foley placed with ~3200 cc immediately out in foley - keep foley in place as she's likely so deconditioned it's going to take time for regaining function  Sepsis ([place])- (present on admission) - Febrile, tachycardia, tachypnea, leukocytosis; presumed lung source given opacities on CT chest with possible aspiration however now having worsening left shoulder pain (s/p L deltoid kenalog injection on 03/06/21) and ongoing low back pain.  White count has been steadily improving for several days. - Given vancomycin, cefepime, Flagyl in the ER - No significant risk factors for MDRO, then changed to CTX/azithro after blood cultures grew MSSA>> changed to Unasyn, and now ultimately is on Ancef - continue q72 hour CRP check; if worsening CRP or mentation then possibly will be changed to nafcillin for 2 weeks.  ID following, greatly appreciate assistance  Chronic diastolic heart failure ([place])- (present on admission) - No S/S exacerbation on exam.  Patient is volume depleted clinically Because of need for D5W, has been getting fluids at 125 cc an hour.  BNP elevated given additional  fluids.  Repeat BNP on 1/31 normal.  At this point now, we will stop fluids and start Lasix and follow renal function appropriately. -Last echo reviewed from 11/06/2020: Moderate MR, impaired relaxation, EF 65% - TTE repeated on 1/23, EF 60-65%, mild LVH; trivial MR on read; normal diastology   Hypernatremia-resolved as of 03/14/2021 Due to poor p.o. intake from altered mentation.  Have changed IV fluids to half-normal saline.  Started on D5W after half-normal saline not improving sodium.  Peaking at 157, sodium has since improved with D5W.  Acute metabolic encephalopathy-resolved as of 03/17/2021 -Seems resolved  Acute respiratory failure with hypoxia (HCC)-resolved as of 03/17/2021, (present on admission) - Presumed due to underlying pneumonia/aspiration -Patient doing well.  Down to 2 L nasal cannula to 100%.  Attempting to wean off oxygen altogether - See sepsis and bacteremia work-up otherwise - remains on RA  Hyponatremia-resolved as of 03/09/2021, (present on admission) - presumed hypovolemic hyponatremia.  Resolved with IV fluids   Subjective: No complaints this morning.  No family at bedside.   Physical Exam: Vitals:   03/25/21 0646 03/25/21 1339 03/25/21 2203 03/26/21 0629  BP: (!) 122/51 (!) 144/70 (!) 127/52 (!) 125/58  Pulse: 97 (!) 101 89 92  Resp:    18  Temp: 98.4 F (36.9 C) 97.8 F (36.6 C) 98.4 F (36.9 C) 98.1 F (36.7 C)  TempSrc: Oral Oral Oral Oral  SpO2: 94% 98% 98% 95%  Weight:      Height:      Physical Exam Constitutional:      Comments: More awake, alert, NAD  HENT:     Head: Normocephalic.     Mouth/Throat:     Mouth: Mucous membranes are moist.  Eyes:     Extraocular Movements: Extraocular movements intact.  Cardiovascular:     Rate and Rhythm: Normal rate and regular rhythm.     Comments: Subtle 2/6 HSM noted in precordium Pulmonary:     Effort: Pulmonary effort is normal.     Breath sounds: Normal breath sounds.  Abdominal:     General:  Bowel sounds are normal.     Tenderness: There is no abdominal tenderness.  Musculoskeletal:     Cervical back: Normal range of motion and neck supple.     Comments: I  Neurological:     General: No focal deficit present.  Psychiatric:        Mood and Affect: Mood normal.        Behavior: Behavior normal.     Data Reviewed:  I have Reviewed nursing notes, Vitals, and Lab results since pt's last encounter. Pertinent lab results creat 1.34, CRP 4  I have ordered test including BMP, CBC, Mg I have reviewed the last note from all staff over past 24 hours,  I have discussed pt's care plan and test results with nursing staff, CM.   Family Communication: son   Code Status: Full Code  Disposition: Status is: Inpatient Remains inpatient appropriate because: ongoing treatment of osteomyelitis, severe deconditioning and transient altered mentation     Planned Discharge Destination: Skilled nursing facility Author: Phillips Grout, MD 03/26/2021 9:38 AM

## 2021-03-26 NOTE — Progress Notes (Signed)
[facility] for Infectious Disease  Date of Admission:  03/08/2021   Total days of inpatient antibiotics 15  Active Problems:   Chronic diastolic heart failure (HCC)   Sepsis (HCC)   Acute renal failure superimposed on stage 3b chronic kidney disease (HCC)   Elevated LFTs   DMII (diabetes mellitus, type 2) (HCC)   Normocytic anemia   Fall at home, initial encounter   Hypothyroidism   MSSA bacteremia   Morbid obesity ([place])   Thrush, oral   Vertebral osteomyelitis ([place])   Abscess   Acute urinary retention   Generalized weakness          Assessment: 75 YF admitted after sliding off her bed, found to have MSSA bacteremia.  #MSSA bacteremia #Epidural phlegmon/dural thickening -MRI T/L spine on 01/28 showed extensive epidural phlegmon/dural thickening thoracolumbar and left psoas and right paraspinal abscess.  -TTE showed no obvious vegetations -CRP trending down 23(2/1)->8.4(2/4). Leukocytosis resolved. Pt is on cefazolin, nafcillin held to avoid AIN/transaminitis.. -Today 2/6 pt has limited LE movement against gravity.  Recommendations: -Continue cefazolin -CRP trending down(4 on 03/24/21) -Pt is amenable to MRI under anesthesia -Would like to get MRI c-spine as well. Plan on MRI next week unless she clinically decline(then would get it sooner).  #Thrush -HIV negative, not on inhaled steroids. Risk factor unclear.  -Continue fluconazole x 14d (EOT 03/26/21) Microbiology:   Antibiotics: Azithromycin 1/22 Unasyn 1/23-24 Cefazolin 1/24-p Cefepime 1/22 Ceftriaxone 1/22 Metronidazole and vanc 1/22 Cultures: Blood 1/22 2/2 MSSA 1/23 NG 1/24 NG  Urine 1/22 NG   SUBJECTIVE: Sitting in chair. Her amain complaints is related to diet  Interval: Afebrile   Review of Systems: Review of Systems  All other systems reviewed and are negative.   Scheduled Meds:  carbamazepine  200 mg Oral QHS   chlorhexidine  15 mL Mouth Rinse BID   Chlorhexidine  Gluconate Cloth  6 each Topical Daily   furosemide  20 mg Intravenous Daily   heparin  5,000 Units Subcutaneous Q8H   hydrocortisone   Topical BID   insulin aspart  0-5 Units Subcutaneous QHS   insulin aspart  0-9 Units Subcutaneous TID WC   lactulose  10 g Oral TID   levothyroxine  88 mcg Oral Q0600   mouth rinse  15 mL Mouth Rinse q12n4p   polyethylene glycol  17 g Oral BID   senna-docusate  1 tablet Oral BID   sodium chloride flush  3 mL Intravenous Q12H   Continuous Infusions:   ceFAZolin (ANCEF) IV 2 g (03/26/21 0631)   PRN Meds:.acetaminophen **OR** acetaminophen, bisacodyl, hydrALAZINE, labetalol, lip balm, naphazoline-glycerin, ondansetron (ZOFRAN) IV No Known Allergies  OBJECTIVE: Vitals:   03/25/21 0646 03/25/21 1339 03/25/21 2203 03/26/21 0629  BP: (!) 122/51 (!) 144/70 (!) 127/52 (!) 125/58  Pulse: 97 (!) 101 89 92  Resp:    18  Temp: 98.4 F (36.9 C) 97.8 F (36.6 C) 98.4 F (36.9 C) 98.1 F (36.7 C)  TempSrc: Oral Oral Oral Oral  SpO2: 94% 98% 98% 95%  Weight:      Height:       Body mass index is 34.48 kg/m.  Physical Exam Constitutional:      Appearance: Normal appearance.  HENT:     Head: Normocephalic and atraumatic.     Right Ear: Tympanic membrane normal.     Left Ear: Tympanic membrane normal.     Nose: Nose normal.     Mouth/Throat:  Mouth: Mucous membranes are moist.  Eyes:     Extraocular Movements: Extraocular movements intact.     Conjunctiva/sclera: Conjunctivae normal.     Pupils: Pupils are equal, round, and reactive to light.  Cardiovascular:     Rate and Rhythm: Normal rate and regular rhythm.     Heart sounds: No murmur heard.   No friction rub. No gallop.  Pulmonary:     Effort: Pulmonary effort is normal.     Breath sounds: Normal breath sounds.  Abdominal:     General: Abdomen is flat.     Palpations: Abdomen is soft.  Skin:    General: Skin is warm and dry.  Neurological:     General: No focal deficit present.      Mental Status: She is alert and oriented to person, place, and time.  Psychiatric:        Mood and Affect: Mood normal.      Lab Results Lab Results  Component Value Date   WBC 8.2 03/26/2021   HGB 9.1 (L) 03/26/2021   HCT 28.6 (L) 03/26/2021   MCV 102.1 (H) 03/26/2021   PLT 301 03/26/2021    Lab Results  Component Value Date   CREATININE 1.47 (H) 03/26/2021   BUN 50 (H) 03/26/2021   NA 135 03/26/2021   K 4.4 03/26/2021   CL 100 03/26/2021   CO2 27 03/26/2021    Lab Results  Component Value Date   ALT 7 03/23/2021   AST 30 03/23/2021   ALKPHOS 69 03/23/2021   BILITOT 0.2 (L) 03/23/2021        Laurice Record, MD [facility]ious [facility] 03/26/2021, 1:18 PM

## 2021-03-27 DIAGNOSIS — R6520 Severe sepsis without septic shock: Secondary | ICD-10-CM

## 2021-03-27 LAB — C-REACTIVE PROTEIN: CRP: 2 mg/dL — ABNORMAL HIGH (ref <1.0)

## 2021-03-27 LAB — CBC WITH DIFFERENTIAL/PLATELET
Abs Immature Granulocytes: 0.62 10*3/uL — ABNORMAL HIGH (ref 0.00–0.07)
Basophils Absolute: 0.1 10*3/uL (ref 0.0–0.1)
Basophils Relative: 1 %
Eosinophils Absolute: 0.3 10*3/uL (ref 0.0–0.5)
Eosinophils Relative: 3 %
HCT: 28.9 % — ABNORMAL LOW (ref 36.0–46.0)
Hemoglobin: 9.3 g/dL — ABNORMAL LOW (ref 12.0–15.0)
Immature Granulocytes: 7 %
Lymphocytes Relative: 16 %
Lymphs Abs: 1.4 10*3/uL (ref 0.7–4.0)
MCH: 32.6 pg (ref 26.0–34.0)
MCHC: 32.2 g/dL (ref 30.0–36.0)
MCV: 101.4 fL — ABNORMAL HIGH (ref 80.0–100.0)
Monocytes Absolute: 0.7 10*3/uL (ref 0.1–1.0)
Monocytes Relative: 8 %
Neutro Abs: 6 10*3/uL (ref 1.7–7.7)
Neutrophils Relative %: 65 %
Platelets: 327 10*3/uL (ref 150–400)
RBC: 2.85 MIL/uL — ABNORMAL LOW (ref 3.87–5.11)
RDW: 14.6 % (ref 11.5–15.5)
WBC: 9 10*3/uL (ref 4.0–10.5)
nRBC: 0 % (ref 0.0–0.2)

## 2021-03-27 LAB — BASIC METABOLIC PANEL
Anion gap: 10 (ref 5–15)
BUN: 47 mg/dL — ABNORMAL HIGH (ref 8–23)
CO2: 25 mmol/L (ref 22–32)
Calcium: 9.4 mg/dL (ref 8.9–10.3)
Chloride: 103 mmol/L (ref 98–111)
Creatinine, Ser: 1.31 mg/dL — ABNORMAL HIGH (ref 0.44–1.00)
GFR, Estimated: 42 mL/min — ABNORMAL LOW (ref >60)
Glucose, Bld: 108 mg/dL — ABNORMAL HIGH (ref 70–99)
Potassium: 4.6 mmol/L (ref 3.5–5.1)
Sodium: 138 mmol/L (ref 135–145)

## 2021-03-27 LAB — GLUCOSE, CAPILLARY
Glucose-Capillary: 167 mg/dL — ABNORMAL HIGH (ref 70–99)
Glucose-Capillary: 182 mg/dL — ABNORMAL HIGH (ref 70–99)
Glucose-Capillary: 123 mg/dL — ABNORMAL HIGH (ref 70–99)
Glucose-Capillary: 162 mg/dL — ABNORMAL HIGH (ref 70–99)

## 2021-03-27 LAB — MAGNESIUM: Magnesium: 2.4 mg/dL (ref 1.7–2.4)

## 2021-03-27 NOTE — Progress Notes (Signed)
Occupational Therapy Treatment Patient Details Name: [name] [name] MRN: [id number] DOB: 11/21/1945 Today's Date: 03/27/2021   History of present illness 76 yo female presents to ED on 1/22 with multiple falls, min confusion, hypoxia; workup for sepsis, ARF. CT chest showed bilateral pleural effusions, bilateral lower lobe bronchial wall thickening with consolidations concerning for possible pneumonia, possible 7 mm left apical lung nodule.  recent outpatient Kenalog injection in the left deltoid on 03/06/2021. PMH anxiety, dCHF, HLD, hypothyroidism, IBS, mood disorder, prediabetes, CKD 3b.   OT comments  Patient is progressing towards goals but requires a lot of encouragement to participate and try physically. She needed max assist to transfer to side of the bed, min assist to stand with stedy x 2 from elevated bed height, but total assist (+2 physical assist) to stand with stedy from [facility]. Total assist for toileting. Increased amount of time in room for patient to perform. Patient likes to distract/thwart therapist by asking for things or have therapist maneuver things. Patient positioned in recliner at end of treatment. Nursing reports she is able to feed herself now when she is in the chair. HIGHLY recommend all staff encourage patient to physically participate more as well as do anything for herself. Continue to recommend short term rehab at discharge.    Recommendations for follow up therapy are one component of a multi-disciplinary discharge planning process, led by the attending physician.  Recommendations may be updated based on patient status, additional functional criteria and insurance authorization.    Follow Up Recommendations  Skilled nursing-short term rehab (<3 hours/day)    Assistance Recommended at Discharge    Patient can return home with the following  Two people to help with walking and/or transfers;Two people to help with bathing/dressing/bathroom;Assistance with  cooking/housework;Assistance with feeding;Direct supervision/assist for medications management;Direct supervision/assist for financial management;Assist for transportation;Help with stairs or ramp for entrance   Equipment Recommendations   (TBD)    Recommendations for Other Services      Precautions / Restrictions Precautions Precautions: Fall Restrictions Weight Bearing Restrictions: No       Mobility Bed Mobility                    Transfers                         Balance Overall balance assessment: Needs assistance Sitting-balance support: No upper extremity supported, Feet supported Sitting balance-Leahy Scale: Fair Sitting balance - Comments: can sit upright without upper exremity support but wants to hand on to something   Standing balance support: Reliant on assistive device for balance Standing balance-Leahy Scale: Poor                             ADL either performed or assessed with clinical judgement   ADL Overall ADL's : Needs assistance/impaired                         Toilet Transfer: BSC/3in1;Total assistance Toilet Transfer Details (indicate cue type and reason): Total assist to transfer to [facility] via stedy. Initially patient min assist to stand from elevated bed height with stedy x 2. Patient unable to power up from [facility] and required +2 physical assistance to stand as well as the stedy. Toileting- Water quality scientist and Hygiene: Sit to/from stand;Total assistance Toileting - Clothing Manipulation Details (indicate cue type and reason): Total assist for toileting - able  to grossly manage standing position holding on to stedy bar while being cleaned.       General ADL Comments: Min assist to stand from elevated bed height with stedy. from [facility] required +2 physical assistance. Patient appeared to fatigue with activity but also could be a lack of motivation component.    Extremity/Trunk Assessment               Vision   Vision Assessment?: No apparent visual deficits   Perception     Praxis      Cognition Arousal/Alertness: Awake/alert Behavior During Therapy: Flat affect Overall Cognitive Status: No family/caregiver present to determine baseline cognitive functioning                                 General Comments: Patient presents with a generalized lack of motivation and requires encouragement to participate and education throughout activity in regards to why she has to move/participate in self care tasks. She attempts to thwart activities by asking for all manner of other things.        Exercises      Shoulder Instructions       General Comments      Pertinent Vitals/ Pain       Pain Assessment Pain Assessment: Faces Faces Pain Scale: Hurts little more Pain Location: back, bottom Pain Descriptors / Indicators: Discomfort, Sore, Aching, Grimacing  Home Living                                          Prior Functioning/Environment              Frequency  Min 2X/week        Progress Toward Goals  OT Goals(current goals can now be found in the care plan section)  Progress towards OT goals: Progressing toward goals  Acute Rehab OT Goals Patient Stated Goal: Does not state OT Goal Formulation: With patient Time For Goal Achievement: 04/06/21 Potential to Achieve Goals: [place] Discharge plan remains appropriate    Co-evaluation          OT goals addressed during session: ADL's and self-care      AM-PAC OT "6 Clicks" Daily Activity     Outcome Measure   Help from another person eating meals?: A Little Help from another person taking care of personal grooming?: A Little Help from another person toileting, which includes using toliet, bedpan, or urinal?: Total Help from another person bathing (including washing, rinsing, drying)?: A Lot Help from another person to put on and taking off regular upper body  clothing?: A Lot Help from another person to put on and taking off regular lower body clothing?: Total 6 Click Score: 12    End of Session Equipment Utilized During Treatment:  (stedy)  OT Visit Diagnosis: Unsteadiness on feet (R26.81);Other abnormalities of gait and mobility (R26.89);Muscle weakness (generalized) (M62.81);Other symptoms and signs involving cognitive function   Activity Tolerance Patient limited by fatigue   Patient Left in chair;with call bell/phone within reach;with chair alarm set   Nurse Communication Mobility status        Time: 3704-8889 OT Time Calculation (min): 35 min  Charges: OT General Charges $OT Visit: 1 Visit OT Treatments $Self Care/Home Management : 8-22 mins $Therapeutic Activity: 8-22 mins  Ori Kreiter, OTR/L Acute Care Rehab Services  Office [phone] Pager: [place] 03/27/2021, 1:01 PM

## 2021-03-27 NOTE — Progress Notes (Signed)
Chaplain met with [name] [name] for follow up support.  She is feeling frustrated about being here for so long and anxious about how things are going at home.  Her husband has some dementia and she is a primary caregiver to her granddaughter.  Her son is at home with them, but also works many hours.  She is feeling sad about being here on her birthday tomorrow.  Chaplain provided emotional support and listening.  [employer], Bcc Pager, [phone] 2:18 PM

## 2021-03-27 NOTE — Progress Notes (Signed)
Patient's nurse, [name] [name], suggested getting permission for 40 y-o granddaughter to visit for [name] [name]'s birthday tomorrow.  Chaplain spoke with [facility] and charge nurse who both gave permission for the visit provided she comes with an adult.  Chaplain left a message for [name] [name]'s son, letting him know to call up to the unit when they are ready to visit either tonight or tomorrow.  [place], [place] Pager, [phone] 4:49 PM

## 2021-03-27 NOTE — Progress Notes (Signed)
Physical Therapy Treatment Patient Details Name: [name] [name] MRN: [id number] DOB: 19-Jul-1945 Today's Date: 03/27/2021   History of Present Illness 76 yo female presents to ED on 1/22 with multiple falls, min confusion, hypoxia; workup for sepsis, ARF. CT chest showed bilateral pleural effusions, bilateral lower lobe bronchial wall thickening with consolidations concerning for possible pneumonia, possible 7 mm left apical lung nodule.  recent outpatient Kenalog injection in the left deltoid on 03/06/2021. PMH anxiety, dCHF, HLD, hypothyroidism, IBS, mood disorder, prediabetes, CKD 3b.    PT Comments    General Comments: AxO x 2 required MAX encouragement as pt is very fearful of falling and continues to have back pain that she describes as "catching" , "quick and sharpe". General transfer comment: So I assisted OT earlier with sit to stand using STEDY and pt appeared to be able to support her weight once upright.  So this session, I performed a frontal "Bear Hug" from recliner with encouragement and full support, pt was able to rise with 75% assist and able to static stand with only 25% assist.  Performed a "side step dance" hold pt 1/4 turn to [facility].  Pt was abl;e to seightshift and step.  Same fashion coming off BSC back to bed.  Pt was able to support her weight but VERY fearful of falling.  Next session, we will attempt ambulation with EVA walker. General bed mobility comments: + 2 to assist back to bed with full support b LE up onto bed and position to comfort.  Recommendations for follow up therapy are one component of a multi-disciplinary discharge planning process, led by the attending physician.  Recommendations may be updated based on patient status, additional functional criteria and insurance authorization.  Follow Up Recommendations  Skilled nursing-short term rehab (<3 hours/day)     Assistance Recommended at Discharge Frequent or constant Supervision/Assistance  Patient can return  home with the following Two people to help with walking and/or transfers;Two people to help with bathing/dressing/bathroom;Help with stairs or ramp for entrance;Assist for transportation;Assistance with cooking/housework   Equipment Recommendations       Recommendations for Other Services       Precautions / Restrictions Precautions Precautions: Fall Precaution Comments: Vertebral osteomyelitis Restrictions Weight Bearing Restrictions: No     Mobility  Bed Mobility Overal bed mobility: Needs Assistance Bed Mobility: Sit to Supine       Sit to supine: HOB elevated, Max assist, +2 for physical assistance   General bed mobility comments: + 2 to assist back to bed with full support b LE up onto bed and position to comfort.    Transfers Overall transfer level: Needs assistance   Transfers: Sit to/from Stand             General transfer comment: So I assisted OT earlier with sit to stand using STEDY and pt appeared to be able to support her weight once upright.  So this session, I performed a frontal "Bear Hug" from recliner with encouragement and full support, pt was able to rise with 75% assist and able to static stand with only 25% assist.  Performed a "side step dance" hold pt 1/4 turn to [facility].  Pt was abl;e to seightshift and step.  Same fashion coming off BSC back to bed.  Pt was able to support her weight but VERY fearful of falling.  Next session, we will attempt ambulation with EVA walker.    Ambulation/Gait  General Gait Details: see transfer section   Stairs             Wheelchair Mobility    Modified Rankin (Stroke Patients Only)       Balance                                            Cognition     Overall Cognitive Status: No family/caregiver present to determine baseline cognitive functioning                                 General Comments: AxO x 2 required MAX encouragement as pt is  very fearful of falling and continues to have back pain that she describes as "catching" , "quick and sharpe".        Exercises      General Comments        Pertinent Vitals/Pain Pain Assessment Pain Assessment: 0-10 Pain Score: 7  Pain Location: back Pain Descriptors / Indicators: Discomfort, Sore, Aching, Grimacing Pain Intervention(s): Monitored during session, Repositioned    Home Living                          Prior Function            PT Goals (current goals can now be found in the care plan section) Progress towards PT goals: Progressing toward goals    Frequency    Min 2X/week      PT Plan Current plan remains appropriate    Co-evaluation       OT goals addressed during session: ADL's and self-care      AM-PAC PT "6 Clicks" Mobility   Outcome Measure  Help needed turning from your back to your side while in a flat bed without using bedrails?: A Lot Help needed moving from lying on your back to sitting on the side of a flat bed without using bedrails?: A Lot Help needed moving to and from a bed to a chair (including a wheelchair)?: A Lot Help needed standing up from a chair using your arms (e.g., wheelchair or bedside chair)?: A Lot Help needed to walk in hospital room?: Total Help needed climbing 3-5 steps with a railing? : Total 6 Click Score: 10    End of Session Equipment Utilized During Treatment: Gait belt Activity Tolerance: Patient limited by fatigue;Patient limited by pain;Other (comment) (fear) Patient left: in bed;with call bell/phone within reach;with bed alarm set Nurse Communication: Mobility status PT Visit Diagnosis: Other abnormalities of gait and mobility (R26.89);Muscle weakness (generalized) (M62.81)     Time: 8916-9450 PT Time Calculation (min) (ACUTE ONLY): 28 min  Charges:  $Therapeutic Activity: 23-37 mins                     {[name] [name]  PTA Acute  Rehabilitation Services Pager       [phone] Office      [phone]

## 2021-03-27 NOTE — Progress Notes (Signed)
Speech Language Pathology Treatment: Dysphagia  Patient Details Name: [name] [name] MRN: [id number] DOB: 03/21/1945 Today's Date: 03/27/2021 Time: 9935-7017 SLP Time Calculation (min) (ACUTE ONLY): 15 min  Assessment / Plan / Recommendation Clinical Impression  Pt seen to address dysphagia goals. She is fully alert and appropriate today in her communication - recalling activies of the day.   She still benefits from encouragement to participate and was only willing to consume water.  Reports xerostomia and labial dryness. SLP put Carmex on pt's lips for comfort.  Observed pt consuming water without difficulties - left within her reach to encourage hydration.  Pt declined to consume solids saying "I'm tired" - and she desires to remain on dys3 diet due to her concern for chipped tooth.  Instructed pt to swish and swallow or expectorate with water for oral clearance after meals - especially if chipped tooth is new - to which she verbalized understanding.  Pt's mentation and swallow likely close to baseline and voice is strong.  Defer to pt and MD for readiness for dietary advancement - All education completed and pt has met goals.    HPI HPI: Pt is a 76 yo female adm 3 days ago with weakness after being found down by her son.  Pt with PMH + for anxiety, mood disorder - weaned from litihium in 2013, morbidly obese, colon cancer, recurrent falls, recent back pain, decreased po intake, breast cancer.  CT chest showed partial consolidations in the lower lobes could reflect atelectasis or mild pneumonia.  Per PT, pt was given water during her therapy session resulting in pt overtly coughing with intake.  Swallow evaluation was ordered.  She is currently on 3 liters oxygen via nasal cannula.  Swallow eval completed on 03/11/2021 and she was made NPO x ice chips.  Pt was placed on a clear liquid diet following dysphagia treatment session.  Management of dysphagia during hospital course has been ongoing.       SLP Plan  Discharge SLP treatment due to (comment);All goals met      Recommendations for follow up therapy are one component of a multi-disciplinary discharge planning process, led by the attending physician.  Recommendations may be updated based on patient status, additional functional criteria and insurance authorization.    Recommendations  Diet recommendations: Dysphagia 3 (mechanical soft);Thin liquid (per pt request) Medication Administration: Whole meds with liquid Supervision: Intermittent supervision to cue for compensatory strategies Compensations: Other (Comment);Slow rate;Small sips/bites Postural Changes and/or Swallow Maneuvers: Seated upright 90 degrees;Upright 30-60 min after meal                Oral Care Recommendations: Oral care BID;Staff/trained caregiver to provide oral care Follow Up Recommendations: No SLP follow up Assistance recommended at discharge: None SLP Visit Diagnosis: Dysphagia, unspecified (R13.10) Plan: Discharge SLP treatment due to (comment);All goals met         Kathleen Lime, MS [facility] SLP Acute Rehab Services Office [phone] Cell [place], [place]  03/27/2021, 1:59 PM

## 2021-03-27 NOTE — Progress Notes (Addendum)
Progress Note   Patient: [name] [name] MVH:[id number] DOB: Dec 13, 1945 DOA: 03/08/2021     19 DOS: the patient was seen and examined on 03/27/2021   Brief hospital course: [name] [name] is a 76 year old female with PMHhypothyroidism, diastolic CHF, stage IIIb chronic kidney disease and mood disorder who presented to the ER on 03/08/2021 after multiple falls at home.  Although poor historian, patient is noted to have slow decline.  In the emergency room, found to be hypoxic and septic from pneumonia.  Following admission, blood cultures positive for MSSA.   MRI noted widespread enhancement of the lower thoracic and lumbar regions involving the dura as well as surface of conus and cauda equina nerve roots with some subdural collections in the lumbar spine.  Discussed case with neurosurgery and after reviewing studies, they do not see any findings for frank epidural abscess in these regions, and therefore no role for open surgical intervention.  Given enhancement of leptomeninges, neurosurgery recommended infection has spread into CNS and dosing antibiotics for CNS infection recommended.  Plan by infectious disease is to continue on Ancef checking CRP every 72 hours and change antibiotics to nafcillin if CRP worsens or mentation worsens.    Assessment and Plan: Acute urinary retention - Differential for etiology includes severe deconditioning as evidenced by requiring 3+ person assist for bed transfers vs spinal cord involvement due to underlying infection - for now deconditioning higher on differential and foley is now in place as of 2/2 - keep foley in place until patient significantly improved physically and mentally as low chance of passing a voiding trial anytime soon - will follow up repeat MRIs next week to re-evaluate spinal cord involvement as well per ID  Vertebral osteomyelitis ([name] [name])- (present on admission) Appreciate infectious disease and neurosurgery assistance.  For now on IV Ancef.   Follow CRP much improved at 4.  Patient declines TEE however management would not change at this point  - needs repeat MRI spine this week (to include C/T/L this time). Might need to be under anesthesia again. anesthesia has been consulted.  Timing per ID team.  Thrush, oral Started on fluconazole.  Slowly improving. - complete 14 day course  Morbid obesity ([name] [name])- (present on admission) Meets criteria for BMI greater than 35+ comorbidities of heart failure and diabetes and hypertension  MSSA bacteremia- (present on admission) - 4/4 bottles positive on admission (1/22) with MSSA. Differential for etiology at this time includes recent outpt steroid injection on 1/20 to Left deltoid vs vertebral infection (worsening lower back pain) vs spontaneous bacteremia vs translocation from pulmonary source -Ancef IV - obtain MRI left shoulder. ID has ordered MRI T/L spine.  Unfortunately, patient too agitated to stay still - TTE on 1/23: some poor images but no obvious vegetations (EF 60-65%, mild LVH).   Blood cultures from 1/24, 1/25 = negative   Hypothyroidism- (present on admission) - TSH, 0.429 - continue Synthroid  Fall at home, initial encounter - Patient found on floor by EMS on their arrival.  Patient states floor is carpeted although she cannot provide any further collateral information regarding her fall or length of time on the floor - Given renal failure on admission which is still likely prerenal - Multiple imaging studies on admission including right wrist x-ray, CT head, CT cervical spine, and CT chest: All with no acute abnormalities but does have 4 mm anterolisthesis C3 on C4 - continue pain control - continue PT/OT  Normocytic anemia- (present on admission) - No complaints or signs  of bleeding.  No recent lab work other than July 2021 (12.3 g/dL at that time).  Hemoglobin stable since admission.  DMII (diabetes mellitus, type 2) (HCC) - History of prediabetes, no A1c on file -  A1c on admission 6.6% - Treat with sliding scale for now and diet control   Elevated LFTs- (present on admission) Suspect secondary to shock liver from sepsis.  Transaminases slowly improving  Acute renal failure superimposed on stage 3b chronic kidney disease ([name] [name])- (present on admission) - patient has history of CKD3b. Baseline creat ~ 1.3, eGFR 38 -Creatinine on admission at 2.47 and with fluids, had improved, down to 1.45 on 1/29.  Since then, mild increase in today at 1.57.  Questioning if this could be some acute heart failure and cardiorenal disease.  We will stop fluids and give Lasix and follow accordingly. - bilateral hydronephrosis noted on MRI.  Patient has a pure wick with ongoing urine output however abdomen remains distended concerning for urine retention and/or constipation - s/p bladder scan on 2/2 with large PVR; foley placed with ~3200 cc immediately out in foley - keep foley in place as she's likely so deconditioned it's going to take time for regaining function  Sepsis ([name] [name])- (present on admission) - Febrile, tachycardia, tachypnea, leukocytosis; presumed lung source given opacities on CT chest with possible aspiration however now having worsening left shoulder pain (s/p L deltoid kenalog injection on 03/06/21) and ongoing low back pain.  White count has been steadily improving for several days. - Given vancomycin, cefepime, Flagyl in the ER - No significant risk factors for MDRO, then changed to CTX/azithro after blood cultures grew MSSA>> changed to Unasyn, and now ultimately is on Ancef - continue q72 hour CRP check; if worsening CRP or mentation then possibly will be changed to nafcillin for 2 weeks.  ID following, greatly appreciate assistance  Chronic diastolic heart failure ([name] [name])- (present on admission) - No S/S exacerbation on exam.  Patient is volume depleted clinically Because of need for D5W, has been getting fluids at 125 cc an hour.  BNP elevated given additional  fluids.  Repeat BNP on 1/31 normal.  At this point now, we will stop fluids and start Lasix and follow renal function appropriately. -Last echo reviewed from 11/06/2020: Moderate MR, impaired relaxation, EF 65% - TTE repeated on 1/23, EF 60-65%, mild LVH; trivial MR on read; normal diastology   Hypernatremia-resolved as of 03/14/2021 Due to poor p.o. intake from altered mentation.  Have changed IV fluids to half-normal saline.  Started on D5W after half-normal saline not improving sodium.  Peaking at 157, sodium has since improved with D5W.  Acute metabolic encephalopathy-resolved as of 03/17/2021 -Seems resolved  Acute respiratory failure with hypoxia (HCC)-resolved as of 03/17/2021, (present on admission) - Presumed due to underlying pneumonia/aspiration -Patient doing well.  Down to 2 L nasal cannula to 100%.  Attempting to wean off oxygen altogether - See sepsis and bacteremia work-up otherwise - remains on RA   Attempted to call daughter via phone no answer  Subjective: Pain well controlled.  Overall not regaining much strength.  No family at bedside.  Physical Exam: Vitals:   03/25/21 1339 03/25/21 2203 03/26/21 0629 03/26/21 2103  BP: (!) 144/70 (!) 127/52 (!) 125/58 117/64  Pulse: (!) 101 89 92 96  Resp:   18 18  Temp: 97.8 F (36.6 C) 98.4 F (36.9 C) 98.1 F (36.7 C) 98.3 F (36.8 C)  TempSrc: Oral Oral Oral Oral  SpO2: 98% 98% 95%  95%  Weight:      Height:      Physical Exam Constitutional:      Comments: More awake, alert, NAD  HENT:     Head: Normocephalic.     Mouth/Throat:     Mouth: Mucous membranes are moist.  Eyes:     Extraocular Movements: Extraocular movements intact.  Cardiovascular:     Rate and Rhythm: Normal rate and regular rhythm.     Comments: Subtle 2/6 HSM noted in precordium Pulmonary:     Effort: Pulmonary effort is normal.     Breath sounds: Normal breath sounds.  Abdominal:     General: Bowel sounds are normal.     Tenderness: There  is no abdominal tenderness.  Musculoskeletal:     Cervical back: Normal range of motion and neck supple.     Comments: I  Neurological:     General: No focal deficit present.  Psychiatric:        Mood and Affect: Mood normal.        Behavior: Behavior normal.       Code Status: Full Code  Disposition: Status is: Inpatient Remains inpatient appropriate because: ongoing treatment of osteomyelitis, severe deconditioning and transient altered mentation     Planned Discharge Destination: Skilled nursing facility Author: Phillips Grout, MD 03/27/2021 9:28 AM  Got a hold of daughter on the phone Anderson [place] and updated to her satisfaction

## 2021-03-28 ENCOUNTER — Encounter (HOSPITAL_COMMUNITY): Payer: Self-pay | Admitting: Internal Medicine

## 2021-03-28 LAB — CBC WITH DIFFERENTIAL/PLATELET
Abs Immature Granulocytes: 0.76 10*3/uL — ABNORMAL HIGH (ref 0.00–0.07)
Basophils Absolute: 0 10*3/uL (ref 0.0–0.1)
Basophils Relative: 0 %
Eosinophils Absolute: 0.3 10*3/uL (ref 0.0–0.5)
Eosinophils Relative: 3 %
HCT: 33.2 % — ABNORMAL LOW (ref 36.0–46.0)
Hemoglobin: 10.3 g/dL — ABNORMAL LOW (ref 12.0–15.0)
Immature Granulocytes: 8 %
Lymphocytes Relative: 16 %
Lymphs Abs: 1.5 10*3/uL (ref 0.7–4.0)
MCH: 32.2 pg (ref 26.0–34.0)
MCHC: 31 g/dL (ref 30.0–36.0)
MCV: 103.8 fL — ABNORMAL HIGH (ref 80.0–100.0)
Monocytes Absolute: 1 10*3/uL (ref 0.1–1.0)
Monocytes Relative: 10 %
Neutro Abs: 6.1 10*3/uL (ref 1.7–7.7)
Neutrophils Relative %: 63 %
Platelets: 296 10*3/uL (ref 150–400)
RBC: 3.2 MIL/uL — ABNORMAL LOW (ref 3.87–5.11)
RDW: 14.9 % (ref 11.5–15.5)
WBC: 9.6 10*3/uL (ref 4.0–10.5)
nRBC: 0 % (ref 0.0–0.2)

## 2021-03-28 LAB — BASIC METABOLIC PANEL
Anion gap: 12 (ref 5–15)
BUN: 50 mg/dL — ABNORMAL HIGH (ref 8–23)
CO2: 23 mmol/L (ref 22–32)
Calcium: 9.4 mg/dL (ref 8.9–10.3)
Chloride: 101 mmol/L (ref 98–111)
Creatinine, Ser: 1.18 mg/dL — ABNORMAL HIGH (ref 0.44–1.00)
GFR, Estimated: 48 mL/min — ABNORMAL LOW (ref >60)
Glucose, Bld: 95 mg/dL (ref 70–99)
Potassium: 4.7 mmol/L (ref 3.5–5.1)
Sodium: 136 mmol/L (ref 135–145)

## 2021-03-28 LAB — GLUCOSE, CAPILLARY
Glucose-Capillary: 142 mg/dL — ABNORMAL HIGH (ref 70–99)
Glucose-Capillary: 158 mg/dL — ABNORMAL HIGH (ref 70–99)
Glucose-Capillary: 132 mg/dL — ABNORMAL HIGH (ref 70–99)
Glucose-Capillary: 116 mg/dL — ABNORMAL HIGH (ref 70–99)

## 2021-03-28 LAB — MAGNESIUM: Magnesium: 2.2 mg/dL (ref 1.7–2.4)

## 2021-03-28 IMAGING — US US RENAL
1 series · 14 of 25 positions shown · non-contrast
Comparison: Prior ultrasound from 03/23/2010.

CLINICAL DATA: Initial evaluation for hypertension, renal disease.

EXAM:
RENAL / URINARY TRACT ULTRASOUND COMPLETE

[Series 1: us renal · 0.28mm/px · 14 of 47 slices shown]
[im 1/47]
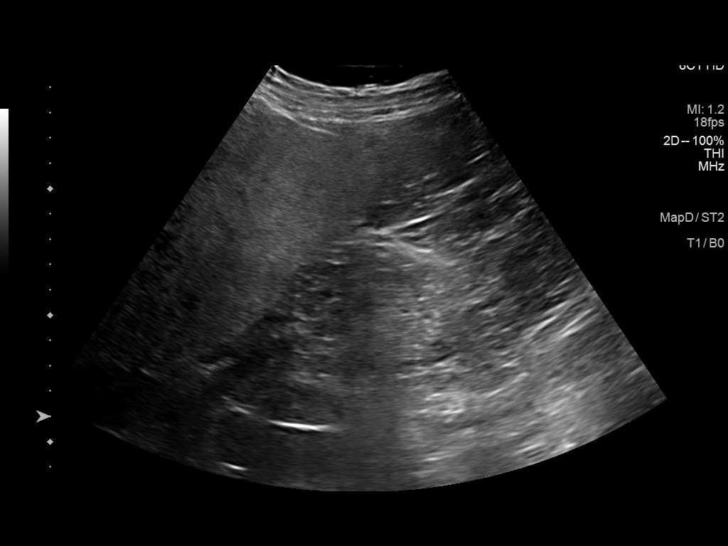
[im 4/47]
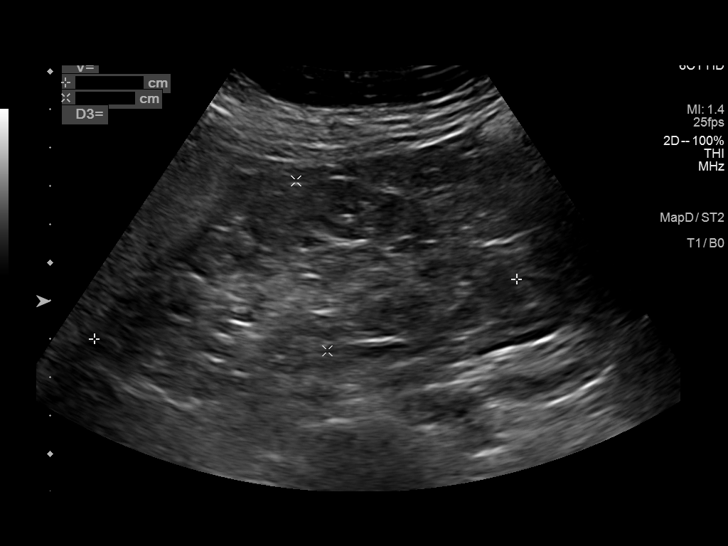
[im 8/47]
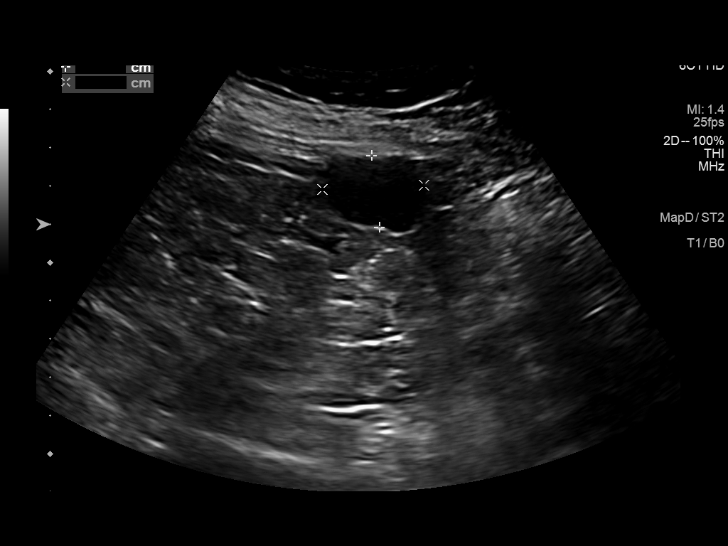
[im 12/47]
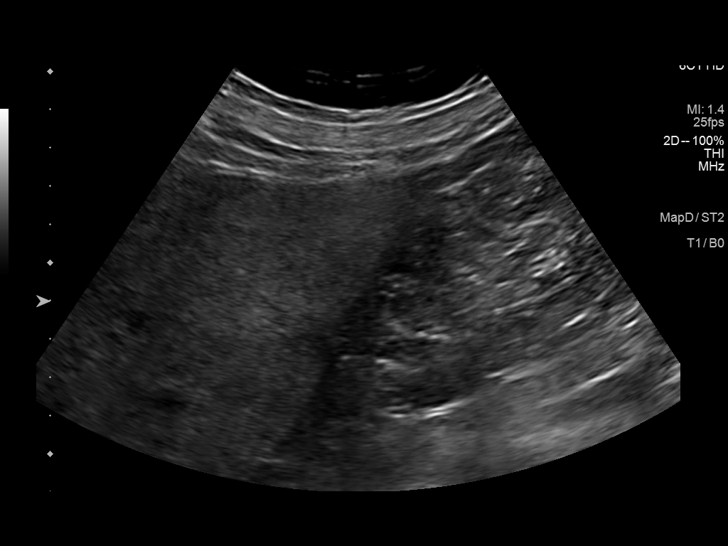
[im 16/47]
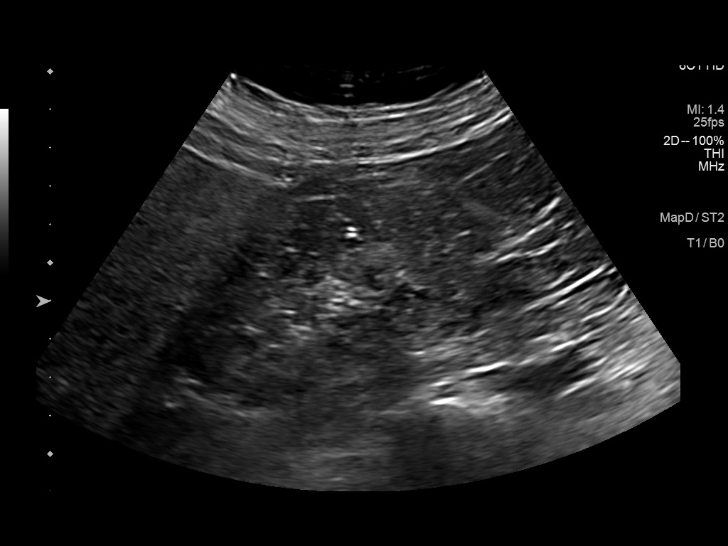
[im 18/47]
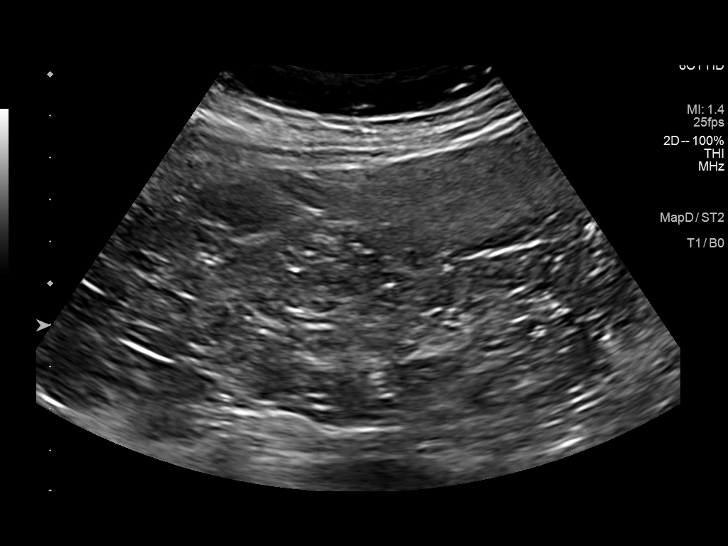
[im 22/47]
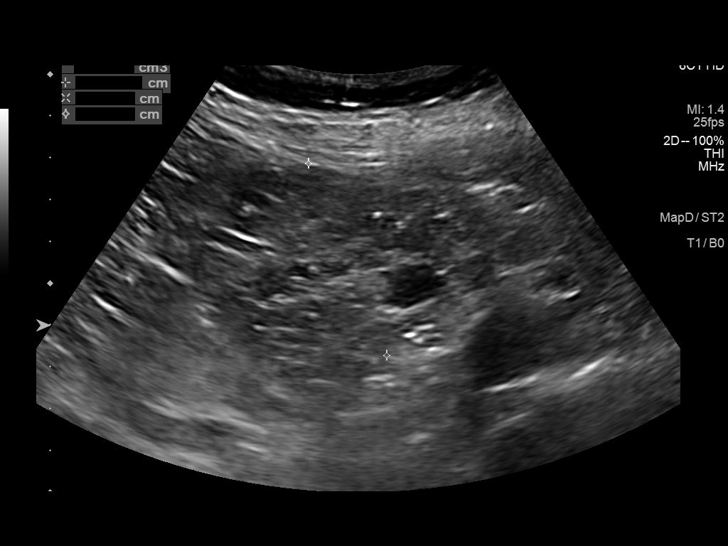
[im 25/47]
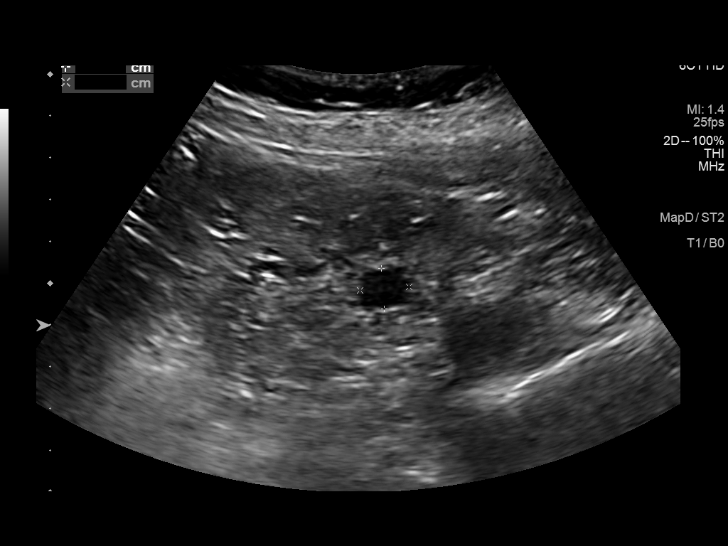
[im 29/47]
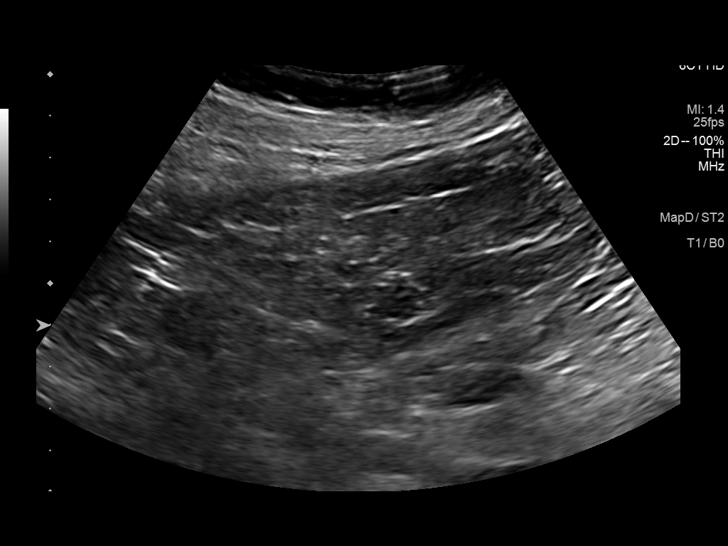
[im 31/47]
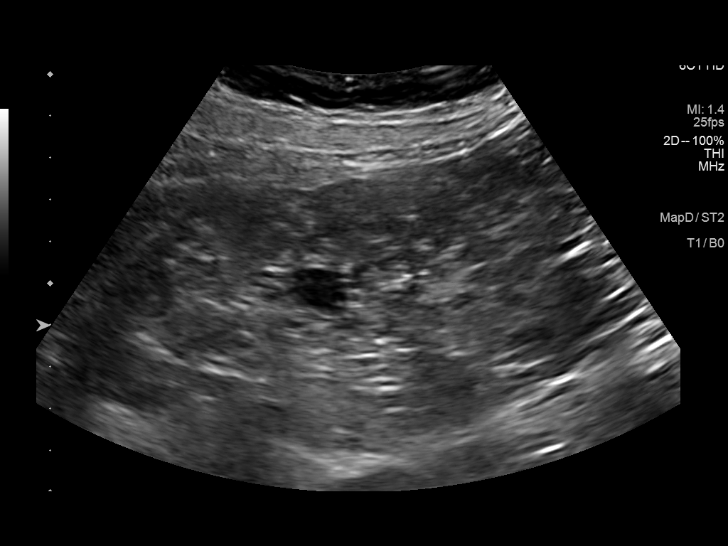
[im 35/47]
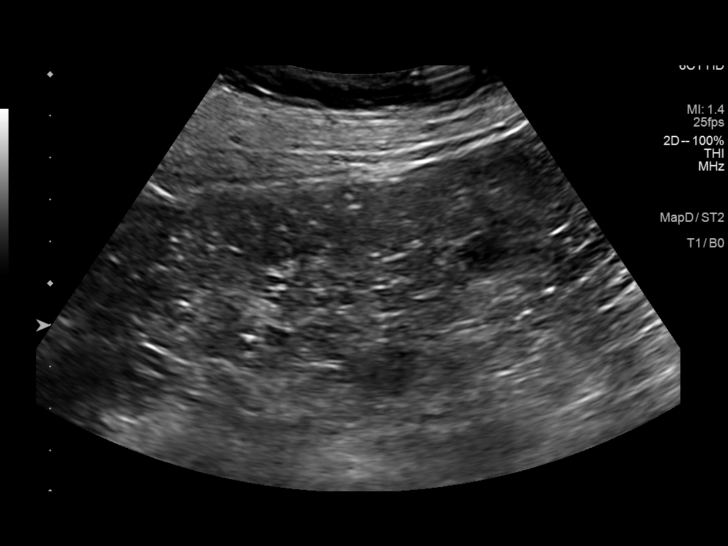
[im 39/47]
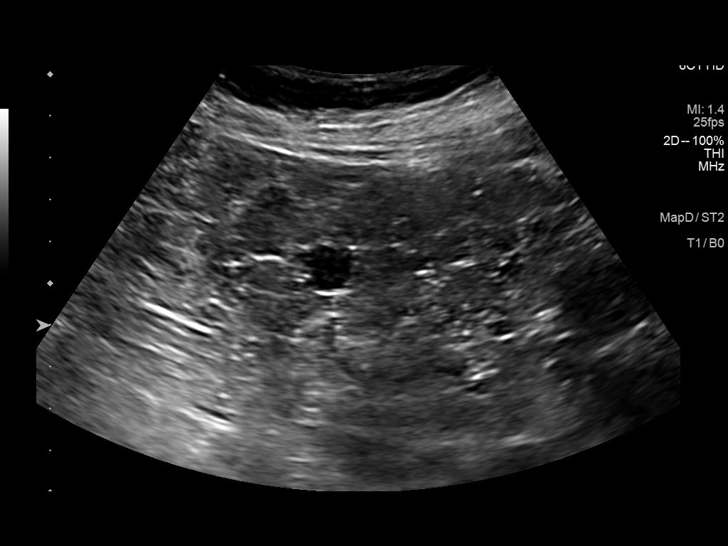
[im 43/47]
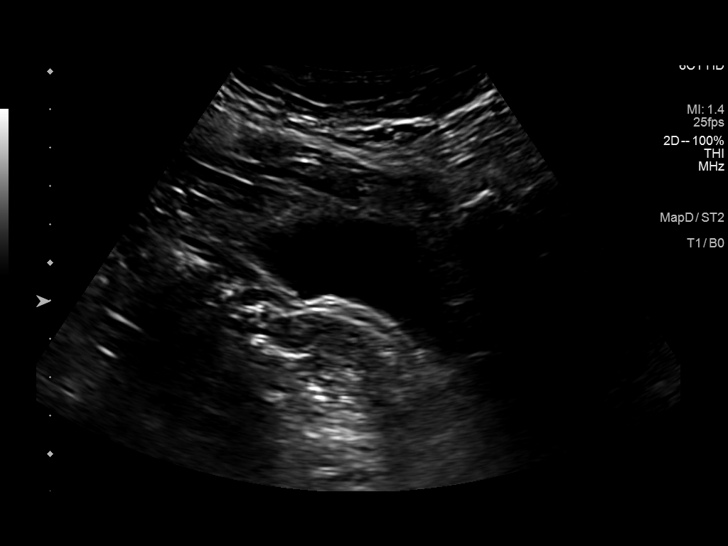
[im 47/47]
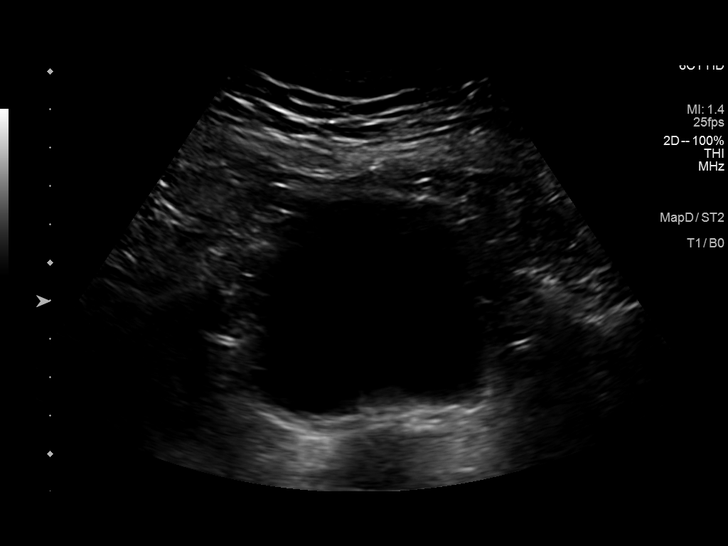

[14 of 25 positions shown; findings below may reference images not displayed]

FINDINGS: Right Kidney:

Renal measurements: 11.2 x 4.5 x 4.7 cm = volume: 124 mL. Diffusely
increased echogenicity within the renal parenchyma with poor
corticomedullary differentiation. No nephrolithiasis or
hydronephrosis. 2.5 x 1.9 x 2.7 cm simple cyst present at the lower
pole. Additional 1.9 x 1.9 x 1.9 cm simple cyst present at the lower
pole as well.

Left Kidney:

Renal measurements: 10.4 x 4.4 x 5.0 cm = volume: 119 mL. Diffusely
increased echogenicity within the renal parenchyma with poor
corticomedullary differentiation. No nephrolithiasis or
hydronephrosis. 1.2 x 1.2 x 1.0 cm simple cyst present at the upper
pole. Additional 1.3 x 1.3 x 1.1 cm simple cyst present at the lower
pole.

Bladder:

Appears normal for degree of bladder distention.

Other:

None.
IMPRESSION: 1. Diffusely increased echogenicity within the renal parenchyma with
poor corticomedullary differentiation, consistent with medical renal
disease. No hydronephrosis.
2. Multiple simple bilateral renal cysts as detailed above.

## 2021-03-28 MED ORDER — FUROSEMIDE 20 MG PO TABS
20.0000 mg | ORAL_TABLET | Freq: Every day | ORAL | Status: DC
  Administered 2021-03-29 – 2021-04-03 (×6): 20 mg via ORAL
  Filled 2021-03-28 (×6): qty 1

## 2021-03-28 NOTE — Progress Notes (Signed)
Orthopedic Tech Progress Note Patient Details:  [name] [name] 1945/11/13 [id number]         [name] [name] 03/28/2021, 11:08 AMreplacement knee immobilizer

## 2021-03-28 NOTE — Progress Notes (Signed)
Orthopedic Tech Progress Note Patient Details:  [name] [name] [birth year] [id number]  Patient ID: [name], female   DOB: Jan 07, 1946, 76 y.o.   MRN: [id number]  [place] 03/28/2021, 12:47 PMPatient didn't need replacement knee Immobilizer. Charges removed.

## 2021-03-28 NOTE — Progress Notes (Addendum)
Progress Note    [name] [name]  NGE:[id number] DOB: May 03, 1945  DOA: 03/08/2021 PCP: Janie Morning, DO      Brief Narrative:    Medical records reviewed and are as summarized below:   [name] [name] is a 76 year old female with PMHhypothyroidism, diastolic CHF, stage IIIb chronic kidney disease and mood disorder who presented to the ER on 03/08/2021 after multiple falls at home.  Although poor historian, patient is noted to have slow decline.  In the emergency room, found to be hypoxic and septic from pneumonia.  Following admission, blood cultures positive for MSSA.   MRI noted widespread enhancement of the lower thoracic and lumbar regions involving the dura as well as surface of conus and cauda equina nerve roots with some subdural collections in the lumbar spine. Given enhancement of leptomeninges, neurosurgery recommended infection has spread into CNS and dosing antibiotics for CNS infection recommended.  Plan by infectious disease is to continue on Ancef checking CRP every 72 hours and change antibiotics to nafcillin if CRP worsens or mentation worsens.      Assessment/Plan:   Active Problems:   Chronic diastolic heart failure (HCC)   Sepsis (HCC)   Acute renal failure superimposed on stage 3b chronic kidney disease (HCC)   Elevated LFTs   DMII (diabetes mellitus, type 2) (HCC)   Normocytic anemia   Fall at home, initial encounter   Hypothyroidism   MSSA bacteremia   Morbid obesity ([place])   Thrush, oral   Vertebral osteomyelitis ([place])   Abscess   Acute urinary retention   Generalized weakness   Body mass index is 34.48 kg/m.  (Obesity)   Sepsis secondary to MSSA bacteremia, vertebral osteomyelitis: Continue IV Ancef.  MRI of the spine to be repeated next week at the anesthesia.  Follow-up with ID.  AKI on CKD stage IIIa: Creatinine is stable  Acute urinary retention: Foley catheter in place  S/p fall at home: Imaging did not show any acute  fractures  Chronic diastolic CHF: Change IV to oral Lasix  Oral thrush: Improved.  Completed fluconazole.  Acute hypoxic respiratory failure, acute metabolic encephalopathy hypernatremia: Resolved  Other comorbidities include hypothyroidism, type II DM, normocytic anemia    Diet Order             DIET DYS 3 Room service appropriate? Yes; Fluid consistency: Thin  Diet effective now                            Consultants: Infectious disease   Procedures: None    Medications:    carbamazepine  200 mg Oral QHS   chlorhexidine  15 mL Mouth Rinse BID   Chlorhexidine Gluconate Cloth  6 each Topical Daily   feeding supplement (GLUCERNA SHAKE)  237 mL Oral TID BM   [START ON 03/29/2021] furosemide  20 mg Oral Daily   heparin  5,000 Units Subcutaneous Q8H   hydrocortisone   Topical BID   insulin aspart  0-5 Units Subcutaneous QHS   insulin aspart  0-9 Units Subcutaneous TID WC   lactulose  10 g Oral TID   levothyroxine  88 mcg Oral Q0600   mouth rinse  15 mL Mouth Rinse q12n4p   polyethylene glycol  17 g Oral BID   senna-docusate  1 tablet Oral BID   sodium chloride flush  3 mL Intravenous Q12H   Continuous Infusions:   ceFAZolin (ANCEF) IV 2 g (03/28/21 0530)  Anti-infectives (From admission, onward)    Start     Dose/Rate Route Frequency Ordered Stop   03/18/21 1000  fluconazole (DIFLUCAN) tablet 200 mg        200 mg Oral Daily 03/17/21 1426 03/25/21 0955   03/15/21 2200  ceFAZolin (ANCEF) IVPB 2g/100 mL premix        2 g 200 mL/hr over 30 Minutes Intravenous Every 8 hours 03/15/21 1853     03/14/21 2200  ceFAZolin (ANCEF) IVPB 2g/100 mL premix  Status:  Discontinued        2 g 200 mL/hr over 30 Minutes Intravenous Every 12 hours 03/14/21 1223 03/15/21 1853   03/12/21 1215  fluconazole (DIFLUCAN) IVPB 200 mg  Status:  Discontinued        200 mg 100 mL/hr over 60 Minutes Intravenous Every 24 hours 03/12/21 1129 03/17/21 1426   03/11/21 0900   ceFAZolin (ANCEF) IVPB 2g/100 mL premix  Status:  Discontinued        2 g 200 mL/hr over 30 Minutes Intravenous Every 8 hours 03/11/21 0801 03/14/21 1223   03/11/21 0100  ceFAZolin (ANCEF) IVPB 2g/100 mL premix  Status:  Discontinued        2 g 200 mL/hr over 30 Minutes Intravenous Every 12 hours 03/10/21 1554 03/11/21 0801   03/10/21 1645  ceFAZolin (ANCEF) IVPB 2g/100 mL premix        2 g 200 mL/hr over 30 Minutes Intravenous  Once 03/10/21 1554 03/10/21 1729   03/09/21 0800  Ampicillin-Sulbactam (UNASYN) 3 g in sodium chloride 0.9 % 100 mL IVPB  Status:  Discontinued        3 g 200 mL/hr over 30 Minutes Intravenous Every 12 hours 03/09/21 0720 03/10/21 1553   03/09/21 0200  metroNIDAZOLE (FLAGYL) IVPB 500 mg  Status:  Discontinued        500 mg 100 mL/hr over 60 Minutes Intravenous Every 12 hours 03/08/21 1801 03/09/21 0720   03/08/21 2130  cefTRIAXone (ROCEPHIN) 2 g in sodium chloride 0.9 % 100 mL IVPB  Status:  Discontinued        2 g 200 mL/hr over 30 Minutes Intravenous Every 24 hours 03/08/21 2030 03/09/21 0720   03/08/21 2130  azithromycin (ZITHROMAX) 500 mg in sodium chloride 0.9 % 250 mL IVPB  Status:  Discontinued        500 mg 250 mL/hr over 60 Minutes Intravenous Every 24 hours 03/08/21 2030 03/09/21 0720   03/08/21 1415  ceFEPIme (MAXIPIME) 2 g in sodium chloride 0.9 % 100 mL IVPB        2 g 200 mL/hr over 30 Minutes Intravenous  Once 03/08/21 1406 03/08/21 1544   03/08/21 1415  metroNIDAZOLE (FLAGYL) IVPB 500 mg        500 mg 100 mL/hr over 60 Minutes Intravenous  Once 03/08/21 1406 03/08/21 1620   03/08/21 1415  vancomycin (VANCOCIN) IVPB 1000 mg/200 mL premix        1,000 mg 200 mL/hr over 60 Minutes Intravenous  Once 03/08/21 1406 03/08/21 1708              Family Communication/Anticipated D/C date and plan/Code Status   DVT prophylaxis: heparin injection 5,000 Units Start: 03/08/21 2200     Code Status: Full Code  Family Communication:  None Disposition Plan: Plan to discharge to SNF when medically stable   Status is: Inpatient Remains inpatient appropriate because: MSSA bacteremia, vertebral osteomyelitis on IV antibiotics  Subjective:   Interval events noted.  She has no complaints.  It's [birth year] birthday today and she seems to be in good spirits.  Objective:    Vitals:   03/26/21 2103 03/27/21 1115 03/27/21 2031 03/28/21 0417  BP: 117/64 132/70 114/63 116/66  Pulse: 96 98 (!) 105 (!) 103  Resp: 18 18 18 18   Temp: 98.3 F (36.8 C) 97.8 F (36.6 C) 98 F (36.7 C) 98.1 F (36.7 C)  TempSrc: Oral Oral Oral Oral  SpO2: 95% 100% 99% 98%  Weight:      Height:       No data found.   Intake/Output Summary (Last 24 hours) at 03/28/2021 1349 Last data filed at 03/28/2021 0932 Gross per 24 hour  Intake 2567.72 ml  Output 3300 ml  Net -732.28 ml   Filed Weights   03/08/21 2041  Weight: 85.5 kg    Exam:  GEN: NAD SKIN: No rash EYES: EOMI ENT: MMM CV: RRR PULM: CTA B ABD: soft, ND, NT, +BS CNS: AAO x 3, non focal EXT: No edema or tenderness GU: Foley catheter draining amber urine       Data Reviewed:   I have personally reviewed following labs and imaging studies:  Labs: Labs show the following:   Basic Metabolic Panel: Recent Labs  Lab 03/24/21 0414 03/25/21 0348 03/26/21 0326 03/27/21 0341 03/28/21 0339  NA 139 137 135 138 136  K 4.2 4.2 4.4 4.6 4.7  CL 103 100 100 103 101  CO2 27 26 27 25 23   GLUCOSE 131* 168* 110* 108* 95  BUN 39* 40* 50* 47* 50*  CREATININE 1.34* 1.35* 1.47* 1.31* 1.18*  CALCIUM 9.2 9.5 9.2 9.4 9.4  MG 2.4 2.3 2.5* 2.4 2.2   GFR Estimated Creatinine Clearance: 41.2 mL/min (A) (by C-G formula based on SCr of 1.18 mg/dL (H)). Liver Function Tests: Recent Labs  Lab 03/22/21 0420 03/23/21 0330  AST 39 30  ALT 8 7  ALKPHOS 71 69  BILITOT 0.5 0.2*  PROT 6.8 6.6  ALBUMIN 2.4* 2.2*   No results for input(s): LIPASE, AMYLASE in  the last 168 hours. No results for input(s): AMMONIA in the last 168 hours. Coagulation profile No results for input(s): INR, PROTIME in the last 168 hours.  CBC: Recent Labs  Lab 03/24/21 0414 03/25/21 0348 03/26/21 0326 03/27/21 0341 03/28/21 0339  WBC 8.1 8.9 8.2 9.0 9.6  NEUTROABS 5.9 6.4 7.3 6.0 6.1  HGB 9.1* 10.0* 9.1* 9.3* 10.3*  HCT 28.6* 31.1* 28.6* 28.9* 33.2*  MCV 101.1* 99.7 102.1* 101.4* 103.8*  PLT 335 245 301 327 296   Cardiac Enzymes: No results for input(s): CKTOTAL, CKMB, CKMBINDEX, TROPONINI in the last 168 hours. BNP (last 3 results) No results for input(s): PROBNP in the last 8760 hours. CBG: Recent Labs  Lab 03/27/21 1150 03/27/21 1722 03/27/21 2032 03/28/21 0736 03/28/21 1255  GLUCAP 182* 123* 162* 142* 158*   D-Dimer: No results for input(s): DDIMER in the last 72 hours. Hgb A1c: No results for input(s): HGBA1C in the last 72 hours. Lipid Profile: No results for input(s): CHOL, HDL, LDLCALC, TRIG, CHOLHDL, LDLDIRECT in the last 72 hours. Thyroid function studies: No results for input(s): TSH, T4TOTAL, T3FREE, THYROIDAB in the last 72 hours.  Invalid input(s): FREET3 Anemia work up: No results for input(s): VITAMINB12, FOLATE, FERRITIN, TIBC, IRON, RETICCTPCT in the last 72 hours. Sepsis Labs: Recent Labs  Lab 03/25/21 0348 03/26/21 0326 03/27/21 0341 03/28/21 0339  WBC 8.9 8.2 9.0  9.6    Microbiology No results found for this or any previous visit (from the past 240 hour(s)).  Procedures and diagnostic studies:  No results found.             LOS: 20 days   [facility]   Pager on [email or web address]. If 7PM-7AM, please contact night-coverage at [email or web address]     03/28/2021, 1:49 PM

## 2021-03-28 NOTE — Progress Notes (Signed)
Physical Therapy Treatment Patient Details Name: [name] [name] MRN: [id number] DOB: 1945/10/18 Today's Date: 03/28/2021   History of Present Illness 76 yo female presents to ED on 1/22 with multiple falls, min confusion, hypoxia; workup for sepsis, ARF. CT chest showed bilateral pleural effusions, bilateral lower lobe bronchial wall thickening with consolidations concerning for possible pneumonia, possible 7 mm left apical lung nodule.  recent outpatient Kenalog injection in the left deltoid on 03/06/2021. PMH anxiety, dCHF, HLD, hypothyroidism, IBS, mood disorder, prediabetes, CKD 3b.    PT Comments     HAPPY BIRTHDAY !!!!! Pt already OOB in recliner. General transfer comment: Pt required + 2 side by side Max Assist to rise from recliner.  Pt very fearful of falling.  Used B platform EVA walker for increased support.  50% VC's on hand transfers and safety with turns. Assisted with [facility] transfer as well.  General Gait Details: Very limited amb distance due to weakness, fatigue and B knees buckling.  Used B platform EVA walker and + 2 assist with recliner following closely behind. Pt was able to amb 15 feet with much effort.  Back pain is slowly improving.   Pt will need ST Rehab at SNF prior to returning home.   Recommendations for follow up therapy are one component of a multi-disciplinary discharge planning process, led by the attending physician.  Recommendations may be updated based on patient status, additional functional criteria and insurance authorization.  Follow Up Recommendations  Skilled nursing-short term rehab (<3 hours/day)     Assistance Recommended at Discharge Frequent or constant Supervision/Assistance  Patient can return home with the following Two people to help with walking and/or transfers;Two people to help with bathing/dressing/bathroom;Help with stairs or ramp for entrance;Assist for transportation;Assistance with cooking/housework   Equipment Recommendations        Recommendations for Other Services       Precautions / Restrictions Precautions Precautions: Fall Precaution Comments: Vertebral osteomyelitis Restrictions Weight Bearing Restrictions: No     Mobility  Bed Mobility               General bed mobility comments: OOB in recliner    Transfers Overall transfer level: Needs assistance Equipment used: Bilateral platform walker (EVA walker) Transfers: Sit to/from Stand Sit to Stand: +2 physical assistance, +2 safety/equipment, From elevated surface, Mod assist           General transfer comment: Pt required + 2 side by side Max Assist to rise from recliner.  Pt very fearful of falling.  Used B platform EVA walker for increased support.  50% VC's on hand transfers and safety with turns.    Ambulation/Gait Ambulation/Gait assistance: Mod assist, Max assist, +2 physical assistance, +2 safety/equipment Gait Distance (Feet): 15 Feet Assistive device: Bilateral platform walker (EVA walker) Gait Pattern/deviations: Step-to pattern, Trunk flexed Gait velocity: decreased     General Gait Details: Very limited amb distance due to weakness, fatigue and B knees buckling.  Used B platform EVA walker and + 2 assist with recliner following closely behind.   Stairs             Wheelchair Mobility    Modified Rankin (Stroke Patients Only)       Balance                                            Cognition Arousal/Alertness: Awake/alert Behavior During Therapy:  Flat affect Overall Cognitive Status: No family/caregiver present to determine baseline cognitive functioning                                 General Comments: AxO x 2 required MAX encouragement as pt is very fearful of falling and continues to have back pain that she describes as "catching" , "quick and sharpe".        Exercises      General Comments        Pertinent Vitals/Pain Pain Assessment Pain Assessment:  Faces Faces Pain Scale: Hurts little more Pain Location: back Pain Descriptors / Indicators: Discomfort, Sore, Aching, Grimacing Pain Intervention(s): Monitored during session, Premedicated before session, Repositioned    Home Living                          Prior Function            PT Goals (current goals can now be found in the care plan section) Progress towards PT goals: Progressing toward goals    Frequency    Min 2X/week      PT Plan Current plan remains appropriate    Co-evaluation              AM-PAC PT "6 Clicks" Mobility   Outcome Measure  Help needed turning from your back to your side while in a flat bed without using bedrails?: A Lot Help needed moving from lying on your back to sitting on the side of a flat bed without using bedrails?: A Lot Help needed moving to and from a bed to a chair (including a wheelchair)?: A Lot Help needed standing up from a chair using your arms (e.g., wheelchair or bedside chair)?: A Lot Help needed to walk in hospital room?: A Lot Help needed climbing 3-5 steps with a railing? : Total 6 Click Score: 11    End of Session Equipment Utilized During Treatment: Gait belt Activity Tolerance: Patient limited by fatigue;Patient limited by pain Patient left: in chair;with call bell/phone within reach;with chair alarm set Nurse Communication: Mobility status PT Visit Diagnosis: Other abnormalities of gait and mobility (R26.89);Muscle weakness (generalized) (M62.81)     Time: 7322-0254 PT Time Calculation (min) (ACUTE ONLY): 25 min  Charges:  $Gait Training: 8-22 mins $Therapeutic Activity: 8-22 mins                     {Lauryl Seyer  PTA Acute  Rehabilitation Services Pager      [phone] Office      [phone]

## 2021-03-28 NOTE — Plan of Care (Signed)
Pt continues with foley in place for urinary retention. Pt encourage to actively participate in care. Pt turned q2 hours. IV abx continued. Pt VSS. Pt denies any further needs at this time.

## 2021-03-29 LAB — GLUCOSE, CAPILLARY
Glucose-Capillary: 128 mg/dL — ABNORMAL HIGH (ref 70–99)
Glucose-Capillary: 124 mg/dL — ABNORMAL HIGH (ref 70–99)
Glucose-Capillary: 84 mg/dL (ref 70–99)
Glucose-Capillary: 120 mg/dL — ABNORMAL HIGH (ref 70–99)

## 2021-03-29 MED ORDER — TIZANIDINE HCL 4 MG PO TABS
2.0000 mg | ORAL_TABLET | Freq: Once | ORAL | Status: AC
  Administered 2021-03-29: 2 mg via ORAL
  Filled 2021-03-29: qty 1

## 2021-03-29 NOTE — Progress Notes (Signed)
Progress Note    [name] [name]  UDJ:[id number] DOB: 1945/03/11  DOA: 03/08/2021 PCP: Janie Morning, DO      Brief Narrative:    Medical records reviewed and are as summarized below:   [name] [name] is a 76 year old female with PMHhypothyroidism, diastolic CHF, stage IIIb chronic kidney disease and mood disorder who presented to the ER on 03/08/2021 after multiple falls at home.  Although poor historian, patient is noted to have slow decline.  In the emergency room, found to be hypoxic and septic from pneumonia.  Following admission, blood cultures positive for MSSA.   MRI noted widespread enhancement of the lower thoracic and lumbar regions involving the dura as well as surface of conus and cauda equina nerve roots with some subdural collections in the lumbar spine. Given enhancement of leptomeninges, neurosurgery recommended infection has spread into CNS and dosing antibiotics for CNS infection recommended.  Plan by infectious disease is to continue on Ancef checking CRP every 72 hours and change antibiotics to nafcillin if CRP worsens or mentation worsens.      Assessment/Plan:   Active Problems:   Chronic diastolic heart failure (HCC)   Sepsis (HCC)   Acute renal failure superimposed on stage 3b chronic kidney disease (HCC)   Elevated LFTs   DMII (diabetes mellitus, type 2) (HCC)   Normocytic anemia   Fall at home, initial encounter   Hypothyroidism   MSSA bacteremia   Morbid obesity ([place])   Thrush, oral   Vertebral osteomyelitis ([place])   Abscess   Acute urinary retention   Generalized weakness   Body mass index is 34.48 kg/m.  (Obesity)   Sepsis secondary to MSSA bacteremia, vertebral osteomyelitis: Continue IV Ancef.  MRI of the spine to be repeated next week under anesthesia.  Follow-up with ID  AKI on CKD stage IIIa: Creatinine is stable  Acute urinary retention: Foley catheter in place  S/p fall at home: Imaging did not show any acute  fractures  Chronic diastolic CHF: Continue Lasix  Oral thrush: Improved.  Completed fluconazole.  Acute hypoxic respiratory failure, acute metabolic encephalopathy hypernatremia: Resolved  Other comorbidities include hypothyroidism, type II DM, normocytic anemia    Diet Order             DIET DYS 3 Room service appropriate? Yes; Fluid consistency: Thin  Diet effective now                            Consultants: Infectious disease   Procedures: None    Medications:    carbamazepine  200 mg Oral QHS   chlorhexidine  15 mL Mouth Rinse BID   Chlorhexidine Gluconate Cloth  6 each Topical Daily   feeding supplement (GLUCERNA SHAKE)  237 mL Oral TID BM   furosemide  20 mg Oral Daily   heparin  5,000 Units Subcutaneous Q8H   hydrocortisone   Topical BID   insulin aspart  0-5 Units Subcutaneous QHS   insulin aspart  0-9 Units Subcutaneous TID WC   lactulose  10 g Oral TID   levothyroxine  88 mcg Oral Q0600   mouth rinse  15 mL Mouth Rinse q12n4p   polyethylene glycol  17 g Oral BID   senna-docusate  1 tablet Oral BID   sodium chloride flush  3 mL Intravenous Q12H   Continuous Infusions:   ceFAZolin (ANCEF) IV 2 g (03/29/21 0620)     Anti-infectives (From admission, onward)  Start     Dose/Rate Route Frequency Ordered Stop   03/18/21 1000  fluconazole (DIFLUCAN) tablet 200 mg        200 mg Oral Daily 03/17/21 1426 03/25/21 0955   03/15/21 2200  ceFAZolin (ANCEF) IVPB 2g/100 mL premix        2 g 200 mL/hr over 30 Minutes Intravenous Every 8 hours 03/15/21 1853     03/14/21 2200  ceFAZolin (ANCEF) IVPB 2g/100 mL premix  Status:  Discontinued        2 g 200 mL/hr over 30 Minutes Intravenous Every 12 hours 03/14/21 1223 03/15/21 1853   03/12/21 1215  fluconazole (DIFLUCAN) IVPB 200 mg  Status:  Discontinued        200 mg 100 mL/hr over 60 Minutes Intravenous Every 24 hours 03/12/21 1129 03/17/21 1426   03/11/21 0900  ceFAZolin (ANCEF) IVPB 2g/100 mL  premix  Status:  Discontinued        2 g 200 mL/hr over 30 Minutes Intravenous Every 8 hours 03/11/21 0801 03/14/21 1223   03/11/21 0100  ceFAZolin (ANCEF) IVPB 2g/100 mL premix  Status:  Discontinued        2 g 200 mL/hr over 30 Minutes Intravenous Every 12 hours 03/10/21 1554 03/11/21 0801   03/10/21 1645  ceFAZolin (ANCEF) IVPB 2g/100 mL premix        2 g 200 mL/hr over 30 Minutes Intravenous  Once 03/10/21 1554 03/10/21 1729   03/09/21 0800  Ampicillin-Sulbactam (UNASYN) 3 g in sodium chloride 0.9 % 100 mL IVPB  Status:  Discontinued        3 g 200 mL/hr over 30 Minutes Intravenous Every 12 hours 03/09/21 0720 03/10/21 1553   03/09/21 0200  metroNIDAZOLE (FLAGYL) IVPB 500 mg  Status:  Discontinued        500 mg 100 mL/hr over 60 Minutes Intravenous Every 12 hours 03/08/21 1801 03/09/21 0720   03/08/21 2130  cefTRIAXone (ROCEPHIN) 2 g in sodium chloride 0.9 % 100 mL IVPB  Status:  Discontinued        2 g 200 mL/hr over 30 Minutes Intravenous Every 24 hours 03/08/21 2030 03/09/21 0720   03/08/21 2130  azithromycin (ZITHROMAX) 500 mg in sodium chloride 0.9 % 250 mL IVPB  Status:  Discontinued        500 mg 250 mL/hr over 60 Minutes Intravenous Every 24 hours 03/08/21 2030 03/09/21 0720   03/08/21 1415  ceFEPIme (MAXIPIME) 2 g in sodium chloride 0.9 % 100 mL IVPB        2 g 200 mL/hr over 30 Minutes Intravenous  Once 03/08/21 1406 03/08/21 1544   03/08/21 1415  metroNIDAZOLE (FLAGYL) IVPB 500 mg        500 mg 100 mL/hr over 60 Minutes Intravenous  Once 03/08/21 1406 03/08/21 1620   03/08/21 1415  vancomycin (VANCOCIN) IVPB 1000 mg/200 mL premix        1,000 mg 200 mL/hr over 60 Minutes Intravenous  Once 03/08/21 1406 03/08/21 1708              Family Communication/Anticipated D/C date and plan/Code Status   DVT prophylaxis: heparin injection 5,000 Units Start: 03/08/21 2200     Code Status: Full Code  Family Communication: None Disposition Plan: Plan to discharge to  SNF when medically stable   Status is: Inpatient Remains inpatient appropriate because: MSSA bacteremia, vertebral osteomyelitis on IV antibiotics               Subjective:  Interval events noted.  She complains of generalized weakness  Objective:    Vitals:   03/27/21 2031 03/28/21 0417 03/28/21 2100 03/29/21 0625  BP: 114/63 116/66 (!) 104/56 109/63  Pulse: (!) 105 (!) 103 (!) 102 89  Resp: 18 18  20   Temp: 98 F (36.7 C) 98.1 F (36.7 C) 98.1 F (36.7 C) 97.8 F (36.6 C)  TempSrc: Oral Oral Oral Oral  SpO2: 99% 98% 99% 98%  Weight:      Height:       No data found.   Intake/Output Summary (Last 24 hours) at 03/29/2021 1329 Last data filed at 03/29/2021 0930 Gross per 24 hour  Intake 240 ml  Output 2200 ml  Net -1960 ml   Filed Weights   03/08/21 2041  Weight: 85.5 kg    Exam:  GEN: NAD SKIN: Warm and dry EYES: No pallor or icterus ENT: MMM CV: RRR PULM: CTA B ABD: soft, ND, NT, +BS CNS: AAO x 3, non focal EXT: No edema or tenderness GU: Foley catheter draining amber urine      Data Reviewed:   I have personally reviewed following labs and imaging studies:  Labs: Labs show the following:   Basic Metabolic Panel: Recent Labs  Lab 03/24/21 0414 03/25/21 0348 03/26/21 0326 03/27/21 0341 03/28/21 0339  NA 139 137 135 138 136  K 4.2 4.2 4.4 4.6 4.7  CL 103 100 100 103 101  CO2 27 26 27 25 23   GLUCOSE 131* 168* 110* 108* 95  BUN 39* 40* 50* 47* 50*  CREATININE 1.34* 1.35* 1.47* 1.31* 1.18*  CALCIUM 9.2 9.5 9.2 9.4 9.4  MG 2.4 2.3 2.5* 2.4 2.2   GFR Estimated Creatinine Clearance: 41.2 mL/min (A) (by C-G formula based on SCr of 1.18 mg/dL (H)). Liver Function Tests: Recent Labs  Lab 03/23/21 0330  AST 30  ALT 7  ALKPHOS 69  BILITOT 0.2*  PROT 6.6  ALBUMIN 2.2*   No results for input(s): LIPASE, AMYLASE in the last 168 hours. No results for input(s): AMMONIA in the last 168 hours. Coagulation profile No results  for input(s): INR, PROTIME in the last 168 hours.  CBC: Recent Labs  Lab 03/24/21 0414 03/25/21 0348 03/26/21 0326 03/27/21 0341 03/28/21 0339  WBC 8.1 8.9 8.2 9.0 9.6  NEUTROABS 5.9 6.4 7.3 6.0 6.1  HGB 9.1* 10.0* 9.1* 9.3* 10.3*  HCT 28.6* 31.1* 28.6* 28.9* 33.2*  MCV 101.1* 99.7 102.1* 101.4* 103.8*  PLT 335 245 301 327 296   Cardiac Enzymes: No results for input(s): CKTOTAL, CKMB, CKMBINDEX, TROPONINI in the last 168 hours. BNP (last 3 results) No results for input(s): PROBNP in the last 8760 hours. CBG: Recent Labs  Lab 03/28/21 1255 03/28/21 1759 03/28/21 2139 03/29/21 0808 03/29/21 1139  GLUCAP 158* 132* 116* 128* 124*   D-Dimer: No results for input(s): DDIMER in the last 72 hours. Hgb A1c: No results for input(s): HGBA1C in the last 72 hours. Lipid Profile: No results for input(s): CHOL, HDL, LDLCALC, TRIG, CHOLHDL, LDLDIRECT in the last 72 hours. Thyroid function studies: No results for input(s): TSH, T4TOTAL, T3FREE, THYROIDAB in the last 72 hours.  Invalid input(s): FREET3 Anemia work up: No results for input(s): VITAMINB12, FOLATE, FERRITIN, TIBC, IRON, RETICCTPCT in the last 72 hours. Sepsis Labs: Recent Labs  Lab 03/25/21 0348 03/26/21 0326 03/27/21 0341 03/28/21 0339  WBC 8.9 8.2 9.0 9.6    Microbiology No results found for this or any previous visit (from the past 240  hour(s)).  Procedures and diagnostic studies:  No results found.             LOS: 21 days   [employer] Copy[employer], advertising on [email or web address]. If 7PM-7AM, please contact night-coverage at [email or web address]     03/29/2021, 1:29 PM

## 2021-03-30 ENCOUNTER — Ambulatory Visit: Payer: Medicare Other

## 2021-03-30 LAB — GLUCOSE, CAPILLARY
Glucose-Capillary: 161 mg/dL — ABNORMAL HIGH (ref 70–99)
Glucose-Capillary: 139 mg/dL — ABNORMAL HIGH (ref 70–99)
Glucose-Capillary: 143 mg/dL — ABNORMAL HIGH (ref 70–99)
Glucose-Capillary: 125 mg/dL — ABNORMAL HIGH (ref 70–99)

## 2021-03-30 NOTE — Progress Notes (Signed)
Physical Therapy Treatment Patient Details Name: [name] [name] MRN: [id number] DOB: 16-Jul-1945 Today's Date: 03/30/2021   History of Present Illness 76 yo female presents to ED on 1/22 with multiple falls, min confusion, hypoxia; workup for sepsis, ARF. CT chest showed bilateral pleural effusions, bilateral lower lobe bronchial wall thickening with consolidations concerning for possible pneumonia, possible 7 mm left apical lung nodule.  recent outpatient Kenalog injection in the left deltoid on 03/06/2021. PMH anxiety, dCHF, HLD, hypothyroidism, IBS, mood disorder, prediabetes, CKD 3b.    PT Comments    Pt not feeling well today.  Skin is moist/clammy and face is flushed.  Pt just got cleaned up from a BM in bed by NT.   Gulose was 138.  BP 126/84, HR 118.  Assisted pt OOB required + 2 assist and increased time.  General bed mobility comments: pt still requiring + 2 Max Assist due to weakness and pain.General transfer comment: Pt required + 2 side by side Max Assist to rise from bed.  Pt very fearful of falling.  Used B platform EVA walker for increased support.  50% VC's on hand transfers and safety with turns.  Also assisted with a BSC transfer.  Pt required increased time and safety with turns.General Gait Details: Very limited amb distance due to weakness, fatigue and B knees buckling.  Used B platform EVA walker and + 2 assist with recliner following closely behind. Assisted back to bed per pt request MAX c/o fatigue.  Pt not as SPRITE as she usually is.  Slightly hazzy and slightly confused.  Assisted with lunch tray set up.   Pt will need ST Rehab at SNF prior to returning home with spouse whom she cares for as he has Alzheimer's.    Recommendations for follow up therapy are one component of a multi-disciplinary discharge planning process, led by the attending physician.  Recommendations may be updated based on patient status, additional functional criteria and insurance authorization.  Follow  Up Recommendations  Skilled nursing-short term rehab (<3 hours/day)     Assistance Recommended at Discharge Frequent or constant Supervision/Assistance  Patient can return home with the following Two people to help with walking and/or transfers;Two people to help with bathing/dressing/bathroom;Help with stairs or ramp for entrance;Assist for transportation;Assistance with cooking/housework   Equipment Recommendations       Recommendations for Other Services       Precautions / Restrictions Precautions Precautions: Fall Precaution Comments: Vertebral osteomyelitis, Sepsis CNS     Mobility  Bed Mobility Overal bed mobility: Needs Assistance Bed Mobility: Sit to Supine, Sit to Sidelying     Supine to sit: Max assist, +2 for physical assistance Sit to supine: Total assist, Max assist, +2 for physical assistance   General bed mobility comments: pt still requiring + 2 Max Assist due to weakness and pain.    Transfers Overall transfer level: Needs assistance Equipment used: Bilateral platform walker Transfers: Sit to/from Stand Sit to Stand: +2 physical assistance, +2 safety/equipment, From elevated surface, Mod assist           General transfer comment: Pt required + 2 side by side Max Assist to rise from bed.  Pt very fearful of falling.  Used B platform EVA walker for increased support.  50% VC's on hand transfers and safety with turns.  Also assisted with a BSC transfer.  Pt required increased time and safety with turns.    Ambulation/Gait Ambulation/Gait assistance: Mod assist, Max assist, +2 physical assistance, +2 safety/equipment Gait Distance (  Feet): 18 Feet Assistive device: Bilateral platform walker Gait Pattern/deviations: Step-to pattern, Trunk flexed Gait velocity: decreased     General Gait Details: Very limited amb distance due to weakness, fatigue and B knees buckling.  Used B platform EVA walker and + 2 assist with recliner following closely  behind.   Stairs             Wheelchair Mobility    Modified Rankin (Stroke Patients Only)       Balance                                            Cognition                                                Exercises      General Comments        Pertinent Vitals/Pain Pain Assessment Pain Assessment: Faces Pain Location: neck and back Pain Descriptors / Indicators: Discomfort, Sore, Aching, Grimacing Pain Intervention(s): Monitored during session, Repositioned    Home Living                          Prior Function            PT Goals (current goals can now be found in the care plan section) Progress towards PT goals: Progressing toward goals    Frequency    Min 2X/week      PT Plan Current plan remains appropriate    Co-evaluation              AM-PAC PT "6 Clicks" Mobility   Outcome Measure  Help needed turning from your back to your side while in a flat bed without using bedrails?: A Lot Help needed moving from lying on your back to sitting on the side of a flat bed without using bedrails?: A Lot Help needed moving to and from a bed to a chair (including a wheelchair)?: A Lot Help needed standing up from a chair using your arms (e.g., wheelchair or bedside chair)?: A Lot Help needed to walk in hospital room?: A Lot Help needed climbing 3-5 steps with a railing? : Total 6 Click Score: 11    End of Session Equipment Utilized During Treatment: Gait belt Activity Tolerance: Patient limited by fatigue;Patient limited by pain Patient left: in bed;with call bell/phone within reach;with bed alarm set Nurse Communication: Mobility status PT Visit Diagnosis: Other abnormalities of gait and mobility (R26.89);Muscle weakness (generalized) (M62.81)     Time: 0940-7680 PT Time Calculation (min) (ACUTE ONLY): 37 min  Charges:  $Gait Training: 8-22 mins $Therapeutic Activity: 8-22 mins                     Rica Koyanagi  PTA Acute  Rehabilitation Services Pager      [phone] Office      [phone]

## 2021-03-30 NOTE — TOC Progression Note (Signed)
Transition of Care [facility]) - Progression Note    Patient Details  Name: [name] [name] MRN: [id number] Date of Birth: [birth year]  [facility] CM/SW Contact  Leeroy Cha, RN Phone Number: 03/30/2021, 10:53 AM  Clinical Narrative:    Following for dc needs   Expected Discharge Plan: [place] Barriers to Discharge: Continued Medical Work up  Expected Discharge Plan and Services Expected Discharge Plan: [place]   Discharge Planning Services: CM Consult   Living arrangements for the past 2 months: Single Family Home                                       Social Determinants of Health (SDOH) Interventions    Readmission Risk Interventions No flowsheet data found.

## 2021-03-30 NOTE — Progress Notes (Signed)
Progress Note    [name] [name]  ACZ:[id number] DOB: 1945/08/06  DOA: 03/08/2021 PCP: [name] Morning, DO      Brief Narrative:    Medical records reviewed and are as summarized below:   [name] [name] is a 76 year old female with PMHhypothyroidism, diastolic CHF, stage IIIb chronic kidney disease and mood disorder who presented to the ER on 03/08/2021 after multiple falls at home.  Although poor historian, patient is noted to have slow decline.  In the emergency room, found to be hypoxic and septic from pneumonia.  Following admission, blood cultures positive for MSSA.   MRI noted widespread enhancement of the lower thoracic and lumbar regions involving the dura as well as surface of conus and cauda equina nerve roots with some subdural collections in the lumbar spine. Given enhancement of leptomeninges, neurosurgery recommended infection has spread into CNS and dosing antibiotics for CNS infection recommended.  Plan by infectious disease is to continue on Ancef checking CRP every 72 hours and change antibiotics to nafcillin if CRP worsens or mentation worsens.      Assessment/Plan:   Active Problems:   Chronic diastolic heart failure (HCC)   Sepsis (HCC)   Acute renal failure superimposed on stage 3b chronic kidney disease (HCC)   Elevated LFTs   DMII (diabetes mellitus, type 2) (HCC)   Normocytic anemia   Fall at home, initial encounter   Hypothyroidism   MSSA bacteremia   Morbid obesity ([place])   Thrush, oral   Vertebral osteomyelitis ([place])   Abscess   Acute urinary retention   Generalized weakness   Body mass index is 34.48 kg/m.  (Obesity)   Sepsis secondary to MSSA bacteremia, vertebral osteomyelitis: Continue IV Ancef.  MRI of the spine to be repeated sometime this week under anesthesia.  ID to decide on timing.    AKI on CKD stage IIIa: Creatinine is stable  Acute urinary retention: Foley catheter in place  S/p fall at home: Imaging did not show any acute  fractures  Chronic diastolic CHF: Continue Lasix  Oral thrush: Improved.  Completed fluconazole.  Acute hypoxic respiratory failure, acute metabolic encephalopathy hypernatremia: Resolved  Other comorbidities include hypothyroidism, type II DM, normocytic anemia    Diet Order             DIET DYS 3 Room service appropriate? Yes; Fluid consistency: Thin  Diet effective now                            Consultants: Infectious disease   Procedures: None    Medications:    carbamazepine  200 mg Oral QHS   chlorhexidine  15 mL Mouth Rinse BID   Chlorhexidine Gluconate Cloth  6 each Topical Daily   feeding supplement (GLUCERNA SHAKE)  237 mL Oral TID BM   furosemide  20 mg Oral Daily   heparin  5,000 Units Subcutaneous Q8H   hydrocortisone   Topical BID   insulin aspart  0-5 Units Subcutaneous QHS   insulin aspart  0-9 Units Subcutaneous TID WC   lactulose  10 g Oral TID   levothyroxine  88 mcg Oral Q0600   mouth rinse  15 mL Mouth Rinse q12n4p   polyethylene glycol  17 g Oral BID   senna-docusate  1 tablet Oral BID   sodium chloride flush  3 mL Intravenous Q12H   Continuous Infusions:   ceFAZolin (ANCEF) IV 2 g (03/30/21 0617)     Anti-infectives (  From admission, onward)    Start     Dose/Rate Route Frequency Ordered Stop   03/18/21 1000  fluconazole (DIFLUCAN) tablet 200 mg        200 mg Oral Daily 03/17/21 1426 03/25/21 0955   03/15/21 2200  ceFAZolin (ANCEF) IVPB 2g/100 mL premix        2 g 200 mL/hr over 30 Minutes Intravenous Every 8 hours 03/15/21 1853     03/14/21 2200  ceFAZolin (ANCEF) IVPB 2g/100 mL premix  Status:  Discontinued        2 g 200 mL/hr over 30 Minutes Intravenous Every 12 hours 03/14/21 1223 03/15/21 1853   03/12/21 1215  fluconazole (DIFLUCAN) IVPB 200 mg  Status:  Discontinued        200 mg 100 mL/hr over 60 Minutes Intravenous Every 24 hours 03/12/21 1129 03/17/21 1426   03/11/21 0900  ceFAZolin (ANCEF) IVPB 2g/100 mL  premix  Status:  Discontinued        2 g 200 mL/hr over 30 Minutes Intravenous Every 8 hours 03/11/21 0801 03/14/21 1223   03/11/21 0100  ceFAZolin (ANCEF) IVPB 2g/100 mL premix  Status:  Discontinued        2 g 200 mL/hr over 30 Minutes Intravenous Every 12 hours 03/10/21 1554 03/11/21 0801   03/10/21 1645  ceFAZolin (ANCEF) IVPB 2g/100 mL premix        2 g 200 mL/hr over 30 Minutes Intravenous  Once 03/10/21 1554 03/10/21 1729   03/09/21 0800  Ampicillin-Sulbactam (UNASYN) 3 g in sodium chloride 0.9 % 100 mL IVPB  Status:  Discontinued        3 g 200 mL/hr over 30 Minutes Intravenous Every 12 hours 03/09/21 0720 03/10/21 1553   03/09/21 0200  metroNIDAZOLE (FLAGYL) IVPB 500 mg  Status:  Discontinued        500 mg 100 mL/hr over 60 Minutes Intravenous Every 12 hours 03/08/21 1801 03/09/21 0720   03/08/21 2130  cefTRIAXone (ROCEPHIN) 2 g in sodium chloride 0.9 % 100 mL IVPB  Status:  Discontinued        2 g 200 mL/hr over 30 Minutes Intravenous Every 24 hours 03/08/21 2030 03/09/21 0720   03/08/21 2130  azithromycin (ZITHROMAX) 500 mg in sodium chloride 0.9 % 250 mL IVPB  Status:  Discontinued        500 mg 250 mL/hr over 60 Minutes Intravenous Every 24 hours 03/08/21 2030 03/09/21 0720   03/08/21 1415  ceFEPIme (MAXIPIME) 2 g in sodium chloride 0.9 % 100 mL IVPB        2 g 200 mL/hr over 30 Minutes Intravenous  Once 03/08/21 1406 03/08/21 1544   03/08/21 1415  metroNIDAZOLE (FLAGYL) IVPB 500 mg        500 mg 100 mL/hr over 60 Minutes Intravenous  Once 03/08/21 1406 03/08/21 1620   03/08/21 1415  vancomycin (VANCOCIN) IVPB 1000 mg/200 mL premix        1,000 mg 200 mL/hr over 60 Minutes Intravenous  Once 03/08/21 1406 03/08/21 1708              Family Communication/Anticipated D/C date and plan/Code Status   DVT prophylaxis: heparin injection 5,000 Units Start: 03/08/21 2200     Code Status: Full Code  Family Communication: None Disposition Plan: Plan to discharge to  SNF when medically stable   Status is: Inpatient Remains inpatient appropriate because: MSSA bacteremia, vertebral osteomyelitis on IV antibiotics  Subjective:   Interval events noted.  She was on the phone with the financial company when I walked in this [name].  She said she is worried about her finances and she is trying to put things in order.  Objective:    Vitals:   03/29/21 0625 03/29/21 2105 03/30/21 0620 03/30/21 1321  BP: 109/63 113/60 111/78 124/61  Pulse: 89 99 96 (!) 110  Resp: 20 20 18 18   Temp: 97.8 F (36.6 C) 98.7 F (37.1 C) 98 F (36.7 C) 98.7 F (37.1 C)  TempSrc: Oral Oral Oral Oral  SpO2: 98% 96% 100% 96%  Weight:      Height:       No data found.   Intake/Output Summary (Last 24 hours) at 03/30/2021 1525 Last data filed at 03/30/2021 0617 Gross per 24 hour  Intake 480 ml  Output 1900 ml  Net -1420 ml   Filed Weights   03/08/21 2041  Weight: 85.5 kg    Exam:  GEN: NAD SKIN: Warm and dry EYES: EOMI ENT: MMM CV: RRR PULM: CTA B ABD: soft, ND, NT, +BS CNS: AAO x 3, non focal EXT: No edema or tenderness GU: Foley catheter with amber urine      Data Reviewed:   I have personally reviewed following labs and imaging studies:  Labs: Labs show the following:   Basic Metabolic Panel: Recent Labs  Lab 03/24/21 0414 03/25/21 0348 03/26/21 0326 03/27/21 0341 03/28/21 0339  NA 139 137 135 138 136  K 4.2 4.2 4.4 4.6 4.7  CL 103 100 100 103 101  CO2 27 26 27 25 23   GLUCOSE 131* 168* 110* 108* 95  BUN 39* 40* 50* 47* 50*  CREATININE 1.34* 1.35* 1.47* 1.31* 1.18*  CALCIUM 9.2 9.5 9.2 9.4 9.4  MG 2.4 2.3 2.5* 2.4 2.2   GFR Estimated Creatinine Clearance: 41.2 mL/min (A) (by C-G formula based on SCr of 1.18 mg/dL (H)). Liver Function Tests: No results for input(s): AST, ALT, ALKPHOS, BILITOT, PROT, ALBUMIN in the last 168 hours.  No results for input(s): LIPASE, AMYLASE in the last 168 hours. No  results for input(s): AMMONIA in the last 168 hours. Coagulation profile No results for input(s): INR, PROTIME in the last 168 hours.  CBC: Recent Labs  Lab 03/24/21 0414 03/25/21 0348 03/26/21 0326 03/27/21 0341 03/28/21 0339  WBC 8.1 8.9 8.2 9.0 9.6  NEUTROABS 5.9 6.4 7.3 6.0 6.1  HGB 9.1* 10.0* 9.1* 9.3* 10.3*  HCT 28.6* 31.1* 28.6* 28.9* 33.2*  MCV 101.1* 99.7 102.1* 101.4* 103.8*  PLT 335 245 301 327 296   Cardiac Enzymes: No results for input(s): CKTOTAL, CKMB, CKMBINDEX, TROPONINI in the last 168 hours. BNP (last 3 results) No results for input(s): PROBNP in the last 8760 hours. CBG: Recent Labs  Lab 03/29/21 1139 03/29/21 1728 03/29/21 2113 03/30/21 0741 03/30/21 1142  GLUCAP 124* 84 120* 161* 139*   D-Dimer: No results for input(s): DDIMER in the last 72 hours. Hgb A1c: No results for input(s): HGBA1C in the last 72 hours. Lipid Profile: No results for input(s): CHOL, HDL, LDLCALC, TRIG, CHOLHDL, LDLDIRECT in the last 72 hours. Thyroid function studies: No results for input(s): TSH, T4TOTAL, T3FREE, THYROIDAB in the last 72 hours.  Invalid input(s): FREET3 Anemia work up: No results for input(s): VITAMINB12, FOLATE, FERRITIN, TIBC, IRON, RETICCTPCT in the last 72 hours. Sepsis Labs: Recent Labs  Lab 03/25/21 0348 03/26/21 0326 03/27/21 0341 03/28/21 0339  WBC 8.9 8.2 9.0 9.6  Microbiology No results found for this or any previous visit (from the past 240 hour(s)).  Procedures and diagnostic studies:  No results found.             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 If 7PM-7AM, please contact night-coverage at [email or web address]     03/30/2021, 3:25 PM

## 2021-03-31 DIAGNOSIS — L0291 Cutaneous abscess, unspecified: Secondary | ICD-10-CM | POA: Diagnosis not present

## 2021-03-31 DIAGNOSIS — R7881 Bacteremia: Secondary | ICD-10-CM | POA: Diagnosis not present

## 2021-03-31 DIAGNOSIS — M4620 Osteomyelitis of vertebra, site unspecified: Secondary | ICD-10-CM | POA: Diagnosis not present

## 2021-03-31 DIAGNOSIS — B370 Candidal stomatitis: Secondary | ICD-10-CM | POA: Diagnosis not present

## 2021-03-31 LAB — GLUCOSE, CAPILLARY
Glucose-Capillary: 110 mg/dL — ABNORMAL HIGH (ref 70–99)
Glucose-Capillary: 164 mg/dL — ABNORMAL HIGH (ref 70–99)
Glucose-Capillary: 94 mg/dL (ref 70–99)
Glucose-Capillary: 132 mg/dL — ABNORMAL HIGH (ref 70–99)

## 2021-03-31 NOTE — Progress Notes (Signed)
Patient with foley catheter in place for retention for 12 days.  MD order to remove, foley removed, purewick place.  Will monitor for output.

## 2021-03-31 NOTE — Progress Notes (Signed)
Occupational Therapy Treatment Patient Details Name: [name] [name] MRN: [id number] DOB: 1945-10-30 Today's Date: 03/31/2021   History of present illness 76 yo female presents to ED on 1/22 with multiple falls, min confusion, hypoxia; workup for sepsis, ARF. CT chest showed bilateral pleural effusions, bilateral lower lobe bronchial wall thickening with consolidations concerning for possible pneumonia, possible 7 mm left apical lung nodule.  recent outpatient Kenalog injection in the left deltoid on 03/06/2021. PMH anxiety, dCHF, HLD, hypothyroidism, IBS, mood disorder, prediabetes, CKD 3b.   OT comments  OT treatment session with focus on self-care re-education, functional transfers, and therapeutic exercise. Patient seated on BSC upon entry. +2 assist for sit to stand from [facility] to stedy. Patient declining attempt to competed hygiene management requiring external assist to complete. Patient continues to require +2 assist for bed mobility and functional transfers with use of DME. OT will continue to follow acutely.    Recommendations for follow up therapy are one component of a multi-disciplinary discharge planning process, led by the attending physician.  Recommendations may be updated based on patient status, additional functional criteria and insurance authorization.    Follow Up Recommendations  Skilled nursing-short term rehab (<3 hours/day)    Assistance Recommended at Discharge Frequent or constant Supervision/Assistance  Patient can return home with the following  Two people to help with walking and/or transfers;Two people to help with bathing/dressing/bathroom;Assistance with cooking/housework;Assistance with feeding;Direct supervision/assist for medications management;Direct supervision/assist for financial management;Assist for transportation;Help with stairs or ramp for entrance   Equipment Recommendations  Other (comment) (Defer to next level of care.)    Recommendations for Other  Services      Precautions / Restrictions Precautions Precautions: Fall Precaution Comments: Vertebral osteomyelitis, Sepsis CNS Restrictions Weight Bearing Restrictions: No       Mobility Bed Mobility Overal bed mobility: Needs Assistance Bed Mobility: Sit to Supine       Sit to supine: Mod assist, +2 for physical assistance, +2 for safety/equipment   General bed mobility comments: Mod A +2 at trunk and BLE for return to supine. increased time/effort.    Transfers Overall transfer level: Needs assistance Equipment used: Ambulation equipment used Transfers: Sit to/from Stand             General transfer comment: Able to stand from [facility] to stedy with Min-Mod A +2. Able to stand from perched position in stedy without external assist.     Balance Overall balance assessment: Needs assistance Sitting-balance support: No upper extremity supported, Feet supported Sitting balance-Leahy Scale: Fair     Standing balance support: Reliant on assistive device for balance Standing balance-Leahy Scale: Poor                             ADL either performed or assessed with clinical judgement   ADL Overall ADL's : Needs assistance/impaired         Upper Body Bathing: Set up Upper Body Bathing Details (indicate cue type and reason): Perched position in stedy at sink surface.     Upper Body Dressing : Minimal assistance Upper Body Dressing Details (indicate cue type and reason): Donned anterior hospital gown in perched position in stedy.     Toilet Transfer: BSC/3in1;Total assistance Toilet Transfer Details (indicate cue type and reason): Total A with use of Stedy. Toileting- Water quality scientist and Hygiene: Sit to/from stand;Total assistance Toileting - Clothing Manipulation Details (indicate cue type and reason): Able to maintain static standing in stedy with  Min guard. Patient unwilling to attempt hygiene management requiring external assist.             Extremity/Trunk Assessment              Vision       Perception     Praxis      Cognition Arousal/Alertness: Awake/alert Behavior During Therapy: Flat affect                                   General Comments: Follows 1-2 step verbal commands with good accuracy; increased time required to verbally respond to questions.        Exercises      Shoulder Instructions       General Comments      Pertinent Vitals/ Pain       Pain Assessment Pain Assessment: Faces Faces Pain Scale: Hurts little more Pain Location: Mid/low back Pain Descriptors / Indicators: Discomfort, Sore, Aching, Grimacing Pain Intervention(s): Limited activity within patient's tolerance, Monitored during session, Repositioned, Premedicated before session  Home Living                                          Prior Functioning/Environment              Frequency  Min 2X/week        Progress Toward Goals  OT Goals(current goals can now be found in the care plan section)  Progress towards OT goals: Progressing toward goals  Acute Rehab OT Goals Patient Stated Goal: No goals stated. OT Goal Formulation: With patient Time For Goal Achievement: 04/06/21 Potential to Achieve Goals: Fair ADL Goals Pt Will Perform Eating: with set-up;sitting;bed level Pt Will Perform Grooming: with set-up;sitting;bed level Additional ADL Goal #1: Patient will demonstrate fair sitting balance for 8 minutes in order to participate in self care tasks.  Plan Discharge plan remains appropriate;Frequency remains appropriate    Co-evaluation                 AM-PAC OT "6 Clicks" Daily Activity     Outcome Measure   Help from another person eating meals?: A Little Help from another person taking care of personal grooming?: A Little Help from another person toileting, which includes using toliet, bedpan, or urinal?: Total Help from another person bathing (including  washing, rinsing, drying)?: A Lot Help from another person to put on and taking off regular upper body clothing?: A Lot Help from another person to put on and taking off regular lower body clothing?: Total 6 Click Score: 12    End of Session Equipment Utilized During Treatment: Gait belt  OT Visit Diagnosis: Unsteadiness on feet (R26.81);Other abnormalities of gait and mobility (R26.89);Muscle weakness (generalized) (M62.81);Other symptoms and signs involving cognitive function   Activity Tolerance Patient tolerated treatment well;Patient limited by pain   Patient Left in bed;with call bell/phone within reach;with bed alarm set   Nurse Communication Mobility status        Time: 1206-1229 OT Time Calculation (min): 23 min  Charges: OT General Charges $OT Visit: 1 Visit OT Treatments $Self Care/Home Management : 23-37 mins  Laurabeth Yip H. OTR/L Supplemental OT, Department of rehab services [phone]  Ebrima Ranta R H. 03/31/2021, 1:10 PM

## 2021-03-31 NOTE — Progress Notes (Signed)
PROGRESS NOTE    [name] [name]  WTU:[id number] DOB: [birth year] DOA: 03/08/2021 PCP: Janie Morning, DO   Brief Narrative:   Ms. Tatlock is a 76 year old female with PMHhypothyroidism, diastolic CHF, stage IIIb chronic kidney disease and mood disorder who presented to the ER on 03/08/2021 after multiple falls at home.  Although poor historian, patient is noted to have slow decline.  In the emergency room, found to be hypoxic and septic from pneumonia.  Following admission, blood cultures positive for MSSA.   MRI noted widespread enhancement of the lower thoracic and lumbar regions involving the dura as well as surface of conus and cauda equina nerve roots with some subdural collections in the lumbar spine. Given enhancement of leptomeninges, neurosurgery recommended infection has spread into CNS and dosing antibiotics for CNS infection recommended.  Plan by infectious disease is to continue on Ancef checking CRP every 72 hours and change antibiotics to nafcillin if CRP worsens or mentation worsens.  Assessment & Plan:   Active Problems:   Chronic diastolic heart failure (HCC)   Sepsis (HCC)   Acute renal failure superimposed on stage 3b chronic kidney disease (HCC)   Elevated LFTs   DMII (diabetes mellitus, type 2) (HCC)   Normocytic anemia   Fall at home, initial encounter   Hypothyroidism   MSSA bacteremia   Morbid obesity ([place])   Thrush, oral   Vertebral osteomyelitis ([place])   Abscess   Acute urinary retention   Generalized weakness   Sepsis secondary to MSSA bacteremia, vertebral osteomyelitis: Continue IV Ancef.  MRI of the spine to be repeated sometime this week under anesthesia.  ID to decide on timing-Discussed with Dr. Linus Salmons 2/14   AKI on CKD stage IIIa: Creatinine is stable, recheck a.m. labs   Acute urinary retention: Foley catheter in place   S/p fall at home: Imaging did not show any acute fractures   Chronic diastolic CHF: Continue oral Lasix   Oral thrush:  Improved.  Completed fluconazole.   Acute hypoxic respiratory failure, acute metabolic encephalopathy hypernatremia: Resolved   Other comorbidities include hypothyroidism, type II DM, normocytic anemia   DVT prophylaxis:Heparin Code Status: Full Family Communication: Son at bedside 2/14 Disposition Plan:  Status is: Inpatient Remains inpatient appropriate because: Requires ongoing IV antibiotics and further MRI imaging.   Consultants:  ID  Procedures:  See below  Antimicrobials:  Anti-infectives (From admission, onward)    Start     Dose/Rate Route Frequency Ordered Stop   03/18/21 1000  fluconazole (DIFLUCAN) tablet 200 mg        200 mg Oral Daily 03/17/21 1426 03/25/21 0955   03/15/21 2200  ceFAZolin (ANCEF) IVPB 2g/100 mL premix        2 g 200 mL/hr over 30 Minutes Intravenous Every 8 hours 03/15/21 1853     03/14/21 2200  ceFAZolin (ANCEF) IVPB 2g/100 mL premix  Status:  Discontinued        2 g 200 mL/hr over 30 Minutes Intravenous Every 12 hours 03/14/21 1223 03/15/21 1853   03/12/21 1215  fluconazole (DIFLUCAN) IVPB 200 mg  Status:  Discontinued        200 mg 100 mL/hr over 60 Minutes Intravenous Every 24 hours 03/12/21 1129 03/17/21 1426   03/11/21 0900  ceFAZolin (ANCEF) IVPB 2g/100 mL premix  Status:  Discontinued        2 g 200 mL/hr over 30 Minutes Intravenous Every 8 hours 03/11/21 0801 03/14/21 1223   03/11/21 0100  ceFAZolin (ANCEF) IVPB 2g/100 mL  premix  Status:  Discontinued        2 g 200 mL/hr over 30 Minutes Intravenous Every 12 hours 03/10/21 1554 03/11/21 0801   03/10/21 1645  ceFAZolin (ANCEF) IVPB 2g/100 mL premix        2 g 200 mL/hr over 30 Minutes Intravenous  Once 03/10/21 1554 03/10/21 1729   03/09/21 0800  Ampicillin-Sulbactam (UNASYN) 3 g in sodium chloride 0.9 % 100 mL IVPB  Status:  Discontinued        3 g 200 mL/hr over 30 Minutes Intravenous Every 12 hours 03/09/21 0720 03/10/21 1553   03/09/21 0200  metroNIDAZOLE (FLAGYL) IVPB 500 mg   Status:  Discontinued        500 mg 100 mL/hr over 60 Minutes Intravenous Every 12 hours 03/08/21 1801 03/09/21 0720   03/08/21 2130  cefTRIAXone (ROCEPHIN) 2 g in sodium chloride 0.9 % 100 mL IVPB  Status:  Discontinued        2 g 200 mL/hr over 30 Minutes Intravenous Every 24 hours 03/08/21 2030 03/09/21 0720   03/08/21 2130  azithromycin (ZITHROMAX) 500 mg in sodium chloride 0.9 % 250 mL IVPB  Status:  Discontinued        500 mg 250 mL/hr over 60 Minutes Intravenous Every 24 hours 03/08/21 2030 03/09/21 0720   03/08/21 1415  ceFEPIme (MAXIPIME) 2 g in sodium chloride 0.9 % 100 mL IVPB        2 g 200 mL/hr over 30 Minutes Intravenous  Once 03/08/21 1406 03/08/21 1544   03/08/21 1415  metroNIDAZOLE (FLAGYL) IVPB 500 mg        500 mg 100 mL/hr over 60 Minutes Intravenous  Once 03/08/21 1406 03/08/21 1620   03/08/21 1415  vancomycin (VANCOCIN) IVPB 1000 mg/200 mL premix        1,000 mg 200 mL/hr over 60 Minutes Intravenous  Once 03/08/21 1406 03/08/21 1708       Subjective: Patient seen and evaluated today with no new acute complaints or concerns. No acute concerns or events noted overnight.  Objective: Vitals:   03/30/21 0620 03/30/21 1321 03/30/21 2026 03/31/21 0542  BP: 111/78 124/61 129/71 (!) 124/57  Pulse: 96 (!) 110 (!) 105 (!) 102  Resp: 18 18 16 16   Temp: 98 F (36.7 C) 98.7 F (37.1 C) 98.7 F (37.1 C) 98.2 F (36.8 C)  TempSrc: Oral Oral Oral Oral  SpO2: 100% 96% 96% 98%  Weight:      Height:        Intake/Output Summary (Last 24 hours) at 03/31/2021 1137 Last data filed at 03/31/2021 0943 Gross per 24 hour  Intake 1504.37 ml  Output 2100 ml  Net -595.63 ml   Filed Weights   03/08/21 2041  Weight: 85.5 kg    Examination:  General exam: Appears calm and comfortable  Respiratory system: Clear to auscultation. Respiratory effort normal. Cardiovascular system: S1 & S2 heard, RRR.  Gastrointestinal system: Abdomen is soft Central nervous system: Alert  and awake Extremities: No edema Skin: No significant lesions noted Psychiatry: Flat affect.    Data Reviewed: I have personally reviewed following labs and imaging studies  CBC: Recent Labs  Lab 03/25/21 0348 03/26/21 0326 03/27/21 0341 03/28/21 0339  WBC 8.9 8.2 9.0 9.6  NEUTROABS 6.4 7.3 6.0 6.1  HGB 10.0* 9.1* 9.3* 10.3*  HCT 31.1* 28.6* 28.9* 33.2*  MCV 99.7 102.1* 101.4* 103.8*  PLT 245 301 327 563   Basic Metabolic Panel: Recent Labs  Lab 03/25/21  3016 03/26/21 0326 03/27/21 0341 03/28/21 0339  NA 137 135 138 136  K 4.2 4.4 4.6 4.7  CL 100 100 103 101  CO2 26 27 25 23   GLUCOSE 168* 110* 108* 95  BUN 40* 50* 47* 50*  CREATININE 1.35* 1.47* 1.31* 1.18*  CALCIUM 9.5 9.2 9.4 9.4  MG 2.3 2.5* 2.4 2.2   GFR: Estimated Creatinine Clearance: 41.2 mL/min (A) (by C-G formula based on SCr of 1.18 mg/dL (H)). Liver Function Tests: No results for input(s): AST, ALT, ALKPHOS, BILITOT, PROT, ALBUMIN in the last 168 hours. No results for input(s): LIPASE, AMYLASE in the last 168 hours. No results for input(s): AMMONIA in the last 168 hours. Coagulation Profile: No results for input(s): INR, PROTIME in the last 168 hours. Cardiac Enzymes: No results for input(s): CKTOTAL, CKMB, CKMBINDEX, TROPONINI in the last 168 hours. BNP (last 3 results) No results for input(s): PROBNP in the last 8760 hours. HbA1C: No results for input(s): HGBA1C in the last 72 hours. CBG: Recent Labs  Lab 03/30/21 0741 03/30/21 1142 03/30/21 1700 03/30/21 2213 03/31/21 0743  GLUCAP 161* 139* 143* 125* 110*   Lipid Profile: No results for input(s): CHOL, HDL, LDLCALC, TRIG, CHOLHDL, LDLDIRECT in the last 72 hours. Thyroid Function Tests: No results for input(s): TSH, T4TOTAL, FREET4, T3FREE, THYROIDAB in the last 72 hours. Anemia Panel: No results for input(s): VITAMINB12, FOLATE, FERRITIN, TIBC, IRON, RETICCTPCT in the last 72 hours. Sepsis Labs: No results for input(s): PROCALCITON,  LATICACIDVEN in the last 168 hours.  No results found for this or any previous visit (from the past 240 hour(s)).       Radiology Studies: No results found.      Scheduled Meds:  carbamazepine  200 mg Oral QHS   chlorhexidine  15 mL Mouth Rinse BID   Chlorhexidine Gluconate Cloth  6 each Topical Daily   feeding supplement (GLUCERNA SHAKE)  237 mL Oral TID BM   furosemide  20 mg Oral Daily   heparin  5,000 Units Subcutaneous Q8H   hydrocortisone   Topical BID   insulin aspart  0-5 Units Subcutaneous QHS   insulin aspart  0-9 Units Subcutaneous TID WC   lactulose  10 g Oral TID   levothyroxine  88 mcg Oral Q0600   mouth rinse  15 mL Mouth Rinse q12n4p   polyethylene glycol  17 g Oral BID   senna-docusate  1 tablet Oral BID   sodium chloride flush  3 mL Intravenous Q12H   Continuous Infusions:   ceFAZolin (ANCEF) IV 2 g (03/31/21 0507)     LOS: 23 days    Time spent: 35 minutes    Negin Hegg Darleen Crocker, DO [facility]  If 7PM-7AM, please contact night-coverage [email or web address] 03/31/2021, 11:37 AM

## 2021-03-31 NOTE — Progress Notes (Signed)
°    [place] for Infectious Disease   Reason for visit: Follow up on epidural phlegmon and MSSA bacteremia  Interval History: no acute events.  Remains afebrile.     Physical Exam: Constitutional:  Vitals:   03/30/21 2026 03/31/21 0542  BP: 129/71 (!) 124/57  Pulse: (!) 105 (!) 102  Resp: 16 16  Temp: 98.7 F (37.1 C) 98.2 F (36.8 C)  SpO2: 96% 98%   patient appears in NAD, in a chair Respiratory: Normal respiratory effort; CTA B Cardiovascular: RRR   Review of Systems: Constitutional: negative for fevers and chills Gastrointestinal: negative for nausea and diarrhea Integument/breast: negative for rash  Lab Results  Component Value Date   WBC 9.6 03/28/2021   HGB 10.3 (L) 03/28/2021   HCT 33.2 (L) 03/28/2021   MCV 103.8 (H) 03/28/2021   PLT 296 03/28/2021    Lab Results  Component Value Date   CREATININE 1.18 (H) 03/28/2021   BUN 50 (H) 03/28/2021   NA 136 03/28/2021   K 4.7 03/28/2021   CL 101 03/28/2021   CO2 23 03/28/2021    Lab Results  Component Value Date   ALT 7 03/23/2021   AST 30 03/23/2021   ALKPHOS 69 03/23/2021     Microbiology: No results found for this or any previous visit (from the past 240 hour(s)).  Impression/Plan:  1. Extensive epidural enhancement of lumbar spine, paraspinal abscess, left psoas abscess, leptomeningeal/subarachnoid space infection - MSSA in culture growth and overall she seems to be improving.  Plan for repeat MRI and also MRI of cervical spine area for completeness and to know the extent of infection.   She will need prolonged antibiotics CRP trending down  2.  Acute kidney injury - creat trending down, 1.18 today.  Will continue to monitor.

## 2021-03-31 NOTE — Progress Notes (Signed)
Patient with purewick in place, noted to have soaked the bed through with urine and had approximately 100 ccs of yellow urine in canister post foley removal. Will continue to monitor.

## 2021-04-01 ENCOUNTER — Inpatient Hospital Stay: Payer: Self-pay

## 2021-04-01 DIAGNOSIS — K6812 Psoas muscle abscess: Secondary | ICD-10-CM | POA: Diagnosis not present

## 2021-04-01 LAB — BASIC METABOLIC PANEL
Anion gap: 11 (ref 5–15)
BUN: 49 mg/dL — ABNORMAL HIGH (ref 8–23)
CO2: 25 mmol/L (ref 22–32)
Calcium: 10 mg/dL (ref 8.9–10.3)
Chloride: 101 mmol/L (ref 98–111)
Creatinine, Ser: 1.21 mg/dL — ABNORMAL HIGH (ref 0.44–1.00)
GFR, Estimated: 46 mL/min — ABNORMAL LOW (ref >60)
Glucose, Bld: 114 mg/dL — ABNORMAL HIGH (ref 70–99)
Potassium: 4.3 mmol/L (ref 3.5–5.1)
Sodium: 137 mmol/L (ref 135–145)

## 2021-04-01 LAB — CBC
HCT: 32.7 % — ABNORMAL LOW (ref 36.0–46.0)
Hemoglobin: 10.6 g/dL — ABNORMAL LOW (ref 12.0–15.0)
MCH: 32.7 pg (ref 26.0–34.0)
MCHC: 32.4 g/dL (ref 30.0–36.0)
MCV: 100.9 fL — ABNORMAL HIGH (ref 80.0–100.0)
Platelets: 310 10*3/uL (ref 150–400)
RBC: 3.24 MIL/uL — ABNORMAL LOW (ref 3.87–5.11)
RDW: 15.2 % (ref 11.5–15.5)
WBC: 8.7 10*3/uL (ref 4.0–10.5)
nRBC: 0 % (ref 0.0–0.2)

## 2021-04-01 LAB — GLUCOSE, CAPILLARY
Glucose-Capillary: 126 mg/dL — ABNORMAL HIGH (ref 70–99)
Glucose-Capillary: 168 mg/dL — ABNORMAL HIGH (ref 70–99)
Glucose-Capillary: 131 mg/dL — ABNORMAL HIGH (ref 70–99)
Glucose-Capillary: 113 mg/dL — ABNORMAL HIGH (ref 70–99)

## 2021-04-01 LAB — MAGNESIUM: Magnesium: 2.4 mg/dL (ref 1.7–2.4)

## 2021-04-01 MED ORDER — OXYCODONE HCL 5 MG PO TABS
5.0000 mg | ORAL_TABLET | Freq: Four times a day (QID) | ORAL | Status: DC | PRN
  Administered 2021-04-02: 5 mg via ORAL
  Filled 2021-04-01 (×3): qty 1

## 2021-04-01 MED ORDER — CHLORHEXIDINE GLUCONATE CLOTH 2 % EX PADS
6.0000 | MEDICATED_PAD | Freq: Every day | CUTANEOUS | Status: DC
  Administered 2021-04-02 – 2021-04-03 (×2): 6 via TOPICAL

## 2021-04-01 MED ORDER — SODIUM CHLORIDE 0.9% FLUSH
10.0000 mL | Freq: Two times a day (BID) | INTRAVENOUS | Status: DC
  Administered 2021-04-01 – 2021-04-02 (×3): 10 mL

## 2021-04-01 MED ORDER — SODIUM CHLORIDE 0.9% FLUSH
10.0000 mL | INTRAVENOUS | Status: DC | PRN
  Administered 2021-04-03: 10 mL

## 2021-04-01 NOTE — TOC Progression Note (Addendum)
Transition of Care [facility]) - Progression Note    Patient Details  Name: Carlton Sweaney MRN: [id number] Date of Birth: 1945/12/16  [facility] CM/SW Contact  Leeroy Cha, RN Phone Number: 04/01/2021, 8:41 AM  Clinical Narrative:    Following for toc needs and move to snf at [facility].  Will need insurance auth. Blaine Hamper for snf on [id number] started  Expected Discharge Plan: [place] Barriers to Discharge: Continued Medical Work up  Expected Discharge Plan and Services Expected Discharge Plan: [place]   Discharge Planning Services: CM Consult   Living arrangements for the past 2 months: Single Family Home                                       Social Determinants of Health (SDOH) Interventions    Readmission Risk Interventions No flowsheet data found.

## 2021-04-01 NOTE — Progress Notes (Signed)
Peripherally Inserted Central Catheter Placement  The IV Nurse has discussed with the patient and/or persons authorized to consent for the patient, the purpose of this procedure and the potential benefits and risks involved with this procedure.  The benefits include less needle sticks, lab draws from the catheter, and the patient may be discharged home with the catheter. Risks include, but not limited to, infection, bleeding, blood clot (thrombus formation), and puncture of an artery; nerve damage and irregular heartbeat and possibility to perform a PICC exchange if needed/ordered by physician.  Alternatives to this procedure were also discussed.  Bard Power PICC patient education guide, fact sheet on infection prevention and patient information card has been provided to patient /or left at bedside.    PICC Placement Documentation  PICC Single Lumen 41/28/78 Left Cephalic 38 cm 1 cm (Active)  Indication for Insertion or Continuance of Line Home intravenous therapies (PICC only) 04/01/21 2141  Exposed Catheter (cm) 1 cm 04/01/21 2141  Site Assessment Clean, Dry, Intact 04/01/21 2141  Line Status Saline locked;Flushed;Blood return noted 04/01/21 2141  Dressing Type Securing device;Transparent 04/01/21 2141  Dressing Status Antimicrobial disc in place;Clean, Dry, Intact 04/01/21 2141  Safety Lock Not Applicable 67/67/20 9470  Line Care Connections checked and tightened 04/01/21 2141  Line Adjustment (NICU/IV Team Only) No 04/01/21 2141  Dressing Intervention New dressing 04/01/21 2141  Dressing Change Due 04/08/21 04/01/21 2141       Rosalio Macadamia Chenice 04/01/2021, 9:42 PM

## 2021-04-01 NOTE — Progress Notes (Signed)
[place] for Infectious Disease   Reason for visit: Follow up on epidural, paraspinal and psoas abscess and septic arthritis of lumbar facet joints  Interval History: WBC wnl, remains afebrile.  Patient voicing frustration with progress of treatment.  No associated rash or diarrhea.    Physical Exam: Constitutional:  Vitals:   04/01/21 0549 04/01/21 1041  BP: 117/77 134/78  Pulse: 91   Resp:    Temp: 97.8 F (36.6 C)   SpO2: 100%    patient appears in NAD Respiratory: Normal respiratory effort; CTA B Cardiovascular: RRR GI: soft, nt, nd  Review of Systems: Constitutional: negative for fevers and chills Gastrointestinal: negative for nausea and diarrhea Integument/breast: negative for rash  Lab Results  Component Value Date   WBC 8.7 04/01/2021   HGB 10.6 (L) 04/01/2021   HCT 32.7 (L) 04/01/2021   MCV 100.9 (H) 04/01/2021   PLT 310 04/01/2021    Lab Results  Component Value Date   CREATININE 1.21 (H) 04/01/2021   BUN 49 (H) 04/01/2021   NA 137 04/01/2021   K 4.3 04/01/2021   CL 101 04/01/2021   CO2 25 04/01/2021    Lab Results  Component Value Date   ALT 7 03/23/2021   AST 30 03/23/2021   ALKPHOS 69 03/23/2021     Microbiology: No results found for this or any previous visit (from the past 240 hour(s)).  Impression/Plan:  1. Epidural, paraspinal and psoas abscess - positive blood culture with MSSA and has been on cefazolin.  Now day 25 total antibiotics.  CRP trending down and now near normal.  With this extensive of an infection, will plan on 8 weeks IV cefazolin followed by continuation with oral cefadroxil 1 gram twice a day  She will need a follow up MRI of the lumbar, thoracic and cervical area in about 3-4 weeks as an outpatient  2.  Access - I discussed a picc line with her.  Her most recent creat is 1.21 and has trended down so I will order a picc   3.  Disposition - will need rehab/snf.   Discussed with the patient  4.    OPAT: Diagnosis: Paraspinal abscess   Culture Result: MSSA  No Known Allergies  OPAT Orders Discharge antibiotics to be given via PICC line Discharge antibiotics: cefazolin 2 grams IV every 8 hours Per pharmacy protocol yes Aim for Vancomycin trough 15-20 or AUC 400-550 (unless otherwise indicated) Duration: 8 weeks total IV End Date: May 05, 2021  [place] Per Protocol: yes  Home health RN for IV administration and teaching; PICC line care and labs.    Labs weekly while on IV antibiotics: _x_ CBC with differential __ BMP _x_ CMP __x CRP __x ESR __ Vancomycin trough __ CK  _x_ Please pull PIC at completion of IV antibiotics __ Please leave PIC in place until doctor has seen patient or been notified  Fax weekly labs to [phone]  Clinic Follow Up Appt: 04/23/21  @ 11:15 am with Dr. Gale Journey

## 2021-04-01 NOTE — Progress Notes (Signed)
Physical Therapy Treatment Patient Details Name: [name] [name] MRN: [id number] DOB: [birth year] Today's Date: 04/01/2021   History of Present Illness 76 yo female presents to ED on 1/22 with multiple falls, min confusion, hypoxia; workup for sepsis, ARF. CT chest showed bilateral pleural effusions, bilateral lower lobe bronchial wall thickening with consolidations concerning for possible pneumonia, possible 7 mm left apical lung nodule.  recent outpatient Kenalog injection in the left deltoid on 03/06/2021. PMH anxiety, dCHF, HLD, hypothyroidism, IBS, mood disorder, prediabetes, CKD 3b.    PT Comments    Pt is AxO x 3 very pleasant Lady.  Pt medically ready to D/C to SNF.  Pt was already OOB in recliner.  General transfer comment: first assisted from recliner to [facility] 1/4 pivot with + 2 assist and 50% VC's on proper hand placement as well as turn completion.  Then assisted off BSC to amb with B platform Ethelene Hal + 2 assist. General Gait Details: Very limited amb distance due to weakness, fatigue and B knees buckling.  Used B platform EVA walker and + 2 assist with recliner following closely behind. Pt will need ST Rehab at SNF prior to safely returning home.   Recommendations for follow up therapy are one component of a multi-disciplinary discharge planning process, led by the attending physician.  Recommendations may be updated based on patient status, additional functional criteria and insurance authorization.  Follow Up Recommendations  Skilled nursing-short term rehab (<3 hours/day)     Assistance Recommended at Discharge Frequent or constant Supervision/Assistance  Patient can return home with the following Two people to help with walking and/or transfers;Two people to help with bathing/dressing/bathroom;Help with stairs or ramp for entrance;Assist for transportation;Assistance with cooking/housework   Equipment Recommendations       Recommendations for Other Services        Precautions / Restrictions Precautions Precautions: Fall Precaution Comments: Vertebral osteomyelitis, Sepsis CNS Restrictions Weight Bearing Restrictions: No     Mobility  Bed Mobility               General bed mobility comments: OOB in recliner    Transfers Overall transfer level: Needs assistance Equipment used: None, Bilateral platform walker Transfers: Sit to/from Stand, Bed to chair/wheelchair/BSC Sit to Stand: +2 physical assistance, +2 safety/equipment, From elevated surface, Mod assist           General transfer comment: first assisted from recliner to [facility] 1/4 pivot with + 2 assist and 50% VC's on proper hand placement as well as turn completion.  Then assisted off BSC to amb with B platform Ethelene Hal + 2 assist.    Ambulation/Gait Ambulation/Gait assistance: Mod assist, Max assist, +2 physical assistance, +2 safety/equipment Gait Distance (Feet): 22 Feet Assistive device: Bilateral platform walker Gait Pattern/deviations: Step-to pattern, Trunk flexed Gait velocity: decreased     General Gait Details: Very limited amb distance due to weakness, fatigue and B knees buckling.  Used B platform EVA walker and + 2 assist with recliner following closely behind.   Stairs             Wheelchair Mobility    Modified Rankin (Stroke Patients Only)       Balance                                            Cognition Arousal/Alertness: Awake/alert Behavior During Therapy: WFL for tasks assessed/performed  General Comments: AxO x 3 feeling better but nervous about going to Rehab "the unknown" and missing home.        Exercises      General Comments        Pertinent Vitals/Pain Pain Assessment Pain Assessment: Faces Faces Pain Scale: Hurts a little bit Pain Location: Mid/low back "getting better" Pain Descriptors / Indicators: Discomfort, Aching Pain Intervention(s):  Monitored during session, Repositioned    Home Living                          Prior Function            PT Goals (current goals can now be found in the care plan section) Progress towards PT goals: Progressing toward goals    Frequency    Min 2X/week      PT Plan Current plan remains appropriate    Co-evaluation              AM-PAC PT "6 Clicks" Mobility   Outcome Measure  Help needed turning from your back to your side while in a flat bed without using bedrails?: A Lot Help needed moving from lying on your back to sitting on the side of a flat bed without using bedrails?: A Lot Help needed moving to and from a bed to a chair (including a wheelchair)?: A Lot Help needed standing up from a chair using your arms (e.g., wheelchair or bedside chair)?: A Lot Help needed to walk in hospital room?: A Lot Help needed climbing 3-5 steps with a railing? : Total 6 Click Score: 11    End of Session Equipment Utilized During Treatment: Gait belt Activity Tolerance: Patient limited by fatigue;Patient limited by pain Patient left: in chair;with call bell/phone within reach;with chair alarm set Nurse Communication: Mobility status PT Visit Diagnosis: Other abnormalities of gait and mobility (R26.89);Muscle weakness (generalized) (M62.81)     Time: 2297-9892 PT Time Calculation (min) (ACUTE ONLY): 24 min  Charges:  $Gait Training: 8-22 mins $Therapeutic Activity: 8-22 mins                    Rica Koyanagi  PTA Acute  Rehabilitation Services Pager      [phone] Office      [phone]

## 2021-04-01 NOTE — Progress Notes (Signed)
Pt calls out with c/o being uncomfortable, offers no insight on how to attempt to make her more comfortable, pt makes little to no effort to reposition herself. RN educated pt that she needs to try to do these movements to reposition herself in the bed or she will continue to decline and get weaker.    RN instructed her on how to pull herself up in the bed and she was able to use both her arms and her legs to do so.    RN not sure how much napping the patient is doing during the day...the last 2 night shifts she has not slept much.  Pt may benefit from a trapeze to help her reposition?

## 2021-04-01 NOTE — Progress Notes (Signed)
PHARMACY CONSULT NOTE FOR:  OUTPATIENT  PARENTERAL ANTIBIOTIC THERAPY (OPAT)  Indication: MSSA Vertebral Osteomyelitis Regimen: Cefazolin 2g Q8H End date: March 21st, 2023  IV antibiotic discharge orders are pended. To discharging provider:  please sign these orders via discharge navigator,  Select New Orders & click on the button choice - Manage This Unsigned Work.     Thank you for allowing pharmacy to be a part of this patient's care.  Elpidio Anis, PharmD Candidate [phone] 04/01/2021, 11:14 AM

## 2021-04-01 NOTE — Progress Notes (Signed)
[facility]  PROGRESS NOTE  [name] [name] [id number]:[id number] DOB: 17-Sep-1945 DOA: 03/08/2021 PCP: Janie Morning, DO   Brief HPI:   76 year old female with past medical history of hypothyroidism, diastolic CHF, stage IIIb CKD, mood disorder presented to ER on 03/08/2021 after multiple falls at home.  In the ED she was found to be hypoxemic and septic from pneumonia.  Following admission blood cultures were positive for MSSA.  MRI of thoracic and lumbar spine showed enhancement of lower thoracic and lumbar regions involving the dura as well as surface of conus and cauda equina nerve roots with some subdural collections in the lumbar spine.  Given enhancement of the leptomeninges, neurosurgery was consulted and the recommended infection has cleared to CNS and start antibiotics for CNS infection.  Infectious disease was consulted and patient started on Ancef.    Subjective   At this time patient denies any pain.   Assessment/Plan:     Sepsis secondary to MSSA bacteremia/vertebral osteomyelitis -Patient has epidural, paraspinal, psoas abscess and septic arthritis of lumbar facet joints -Patient started on IV Ancef; day #25 of antibiotics -ID plans to complete 8 weeks of IV cefazolin followed by oral cefadroxil 1 g twice a day -Discussed with ID, no need to repeat MRI spine at this time  Acute kidney injury on CKD 3a -Creatinine is 1.21, at baseline  Acute urinary retention -Foley catheter in place  S/p fall at home -Imaging did not show any acute fractures  Chronic diastolic CHF -Euvolemic -Continue Lasix 20 mg p.o. daily  Oral thrush -Unclear etiology -Resolved with fluconazole  Acute hypoxemic respiratory failure -Resolved  Acute metabolic encephalopathy/hypernatremia -Resolved  Diabetes mellitus type 2 -Continue sliding scale insulin with NovoLog -CBG well controlled  Hypothyroidism -Continue Synthroid     Medications     carbamazepine  200 mg Oral QHS    chlorhexidine  15 mL Mouth Rinse BID   feeding supplement (GLUCERNA SHAKE)  237 mL Oral TID BM   furosemide  20 mg Oral Daily   heparin  5,000 Units Subcutaneous Q8H   hydrocortisone   Topical BID   insulin aspart  0-5 Units Subcutaneous QHS   insulin aspart  0-9 Units Subcutaneous TID WC   lactulose  10 g Oral TID   levothyroxine  88 mcg Oral Q0600   mouth rinse  15 mL Mouth Rinse q12n4p   polyethylene glycol  17 g Oral BID   senna-docusate  1 tablet Oral BID   sodium chloride flush  3 mL Intravenous Q12H     Data Reviewed:   CBG:  Recent Labs  Lab 03/31/21 1640 03/31/21 2150 04/01/21 0743 04/01/21 1215 04/01/21 1704  GLUCAP 94 132* 126* 168* 131*    SpO2: 97 % O2 Flow Rate (L/min): 2 L/min    Vitals:   04/01/21 0549 04/01/21 1041 04/01/21 1342 04/01/21 1559  BP: 117/77 134/78 135/74 134/80  Pulse: 91  (!) 113 (!) 110  Resp:   18 18  Temp: 97.8 F (36.6 C)  98.2 F (36.8 C) 98.3 F (36.8 C)  TempSrc: Oral  Oral Oral  SpO2: 100%  98% 97%  Weight:      Height:          Data Reviewed:  Basic Metabolic Panel: Recent Labs  Lab 03/26/21 0326 03/27/21 0341 03/28/21 0339 04/01/21 0403  NA 135 138 136 137  K 4.4 4.6 4.7 4.3  CL 100 103 101 101  CO2 27 25 23 25   GLUCOSE 110* 108* 95  114*  BUN 50* 47* 50* 49*  CREATININE 1.47* 1.31* 1.18* 1.21*  CALCIUM 9.2 9.4 9.4 10.0  MG 2.5* 2.4 2.2 2.4    CBC: Recent Labs  Lab 03/26/21 0326 03/27/21 0341 03/28/21 0339 04/01/21 0403  WBC 8.2 9.0 9.6 8.7  NEUTROABS 7.3 6.0 6.1  --   HGB 9.1* 9.3* 10.3* 10.6*  HCT 28.6* 28.9* 33.2* 32.7*  MCV 102.1* 101.4* 103.8* 100.9*  PLT 301 327 296 310    LFT No results for input(s): AST, ALT, ALKPHOS, BILITOT, PROT, ALBUMIN in the last 168 hours.   Antibiotics: Anti-infectives (From admission, onward)    Start     Dose/Rate Route Frequency Ordered Stop   03/18/21 1000  fluconazole (DIFLUCAN) tablet 200 mg        200 mg Oral Daily 03/17/21 1426 03/25/21 0955    03/15/21 2200  ceFAZolin (ANCEF) IVPB 2g/100 mL premix        2 g 200 mL/hr over 30 Minutes Intravenous Every 8 hours 03/15/21 1853     03/14/21 2200  ceFAZolin (ANCEF) IVPB 2g/100 mL premix  Status:  Discontinued        2 g 200 mL/hr over 30 Minutes Intravenous Every 12 hours 03/14/21 1223 03/15/21 1853   03/12/21 1215  fluconazole (DIFLUCAN) IVPB 200 mg  Status:  Discontinued        200 mg 100 mL/hr over 60 Minutes Intravenous Every 24 hours 03/12/21 1129 03/17/21 1426   03/11/21 0900  ceFAZolin (ANCEF) IVPB 2g/100 mL premix  Status:  Discontinued        2 g 200 mL/hr over 30 Minutes Intravenous Every 8 hours 03/11/21 0801 03/14/21 1223   03/11/21 0100  ceFAZolin (ANCEF) IVPB 2g/100 mL premix  Status:  Discontinued        2 g 200 mL/hr over 30 Minutes Intravenous Every 12 hours 03/10/21 1554 03/11/21 0801   03/10/21 1645  ceFAZolin (ANCEF) IVPB 2g/100 mL premix        2 g 200 mL/hr over 30 Minutes Intravenous  Once 03/10/21 1554 03/10/21 1729   03/09/21 0800  Ampicillin-Sulbactam (UNASYN) 3 g in sodium chloride 0.9 % 100 mL IVPB  Status:  Discontinued        3 g 200 mL/hr over 30 Minutes Intravenous Every 12 hours 03/09/21 0720 03/10/21 1553   03/09/21 0200  metroNIDAZOLE (FLAGYL) IVPB 500 mg  Status:  Discontinued        500 mg 100 mL/hr over 60 Minutes Intravenous Every 12 hours 03/08/21 1801 03/09/21 0720   03/08/21 2130  cefTRIAXone (ROCEPHIN) 2 g in sodium chloride 0.9 % 100 mL IVPB  Status:  Discontinued        2 g 200 mL/hr over 30 Minutes Intravenous Every 24 hours 03/08/21 2030 03/09/21 0720   03/08/21 2130  azithromycin (ZITHROMAX) 500 mg in sodium chloride 0.9 % 250 mL IVPB  Status:  Discontinued        500 mg 250 mL/hr over 60 Minutes Intravenous Every 24 hours 03/08/21 2030 03/09/21 0720   03/08/21 1415  ceFEPIme (MAXIPIME) 2 g in sodium chloride 0.9 % 100 mL IVPB        2 g 200 mL/hr over 30 Minutes Intravenous  Once 03/08/21 1406 03/08/21 1544   03/08/21 1415   metroNIDAZOLE (FLAGYL) IVPB 500 mg        500 mg 100 mL/hr over 60 Minutes Intravenous  Once 03/08/21 1406 03/08/21 1620   03/08/21 1415  vancomycin (VANCOCIN) IVPB 1000  mg/200 mL premix        1,000 mg 200 mL/hr over 60 Minutes Intravenous  Once 03/08/21 1406 03/08/21 1708        DVT prophylaxis: Heparin  Code Status: Full code  Family Communication: No family at bedside   CONSULTS infectious disease, neurosurgery   Objective    Physical Examination:   General-appears in no acute distress Heart-S1-S2, regular, no murmur auscultated Lungs-clear to auscultation bilaterally, no wheezing or crackles auscultated Abdomen-soft, nontender, no organomegaly Extremities-no edema in the lower extremities Neuro-alert, oriented x3, no focal deficit noted   Status is: Inpatient for MSSA bacteremia, vertebral osteomyelitis             [place]   [facility] If 7PM-7AM, please contact night-coverage at [email or web address], Office  [phone]   04/01/2021, 5:26 PM  LOS: 24 days

## 2021-04-02 ENCOUNTER — Inpatient Hospital Stay (HOSPITAL_COMMUNITY): Payer: Medicare Other

## 2021-04-02 DIAGNOSIS — M25531 Pain in right wrist: Secondary | ICD-10-CM

## 2021-04-02 LAB — GLUCOSE, CAPILLARY
Glucose-Capillary: 129 mg/dL — ABNORMAL HIGH (ref 70–99)
Glucose-Capillary: 176 mg/dL — ABNORMAL HIGH (ref 70–99)
Glucose-Capillary: 100 mg/dL — ABNORMAL HIGH (ref 70–99)
Glucose-Capillary: 158 mg/dL — ABNORMAL HIGH (ref 70–99)
Glucose-Capillary: 171 mg/dL — ABNORMAL HIGH (ref 70–99)

## 2021-04-02 NOTE — TOC Progression Note (Signed)
Transition of Care [facility]) - Progression Note   Patient Details  Name: Anyla Israelson MRN: [id number] Date of Birth: 1945/05/11  Transition of Care [facility] CM/SW [place], LCSW Phone Number: 04/02/2021, 2:53 PM  Clinical Narrative: Patient is medically stable for discharge, but CSW confirmed with [facility] in admissions at [facility] that the facility does not have any discharges so there are no beds available. CSW spoke with patient about choosing another bed. Patient reported she wanted to wait until tomorrow, but CSW explained that patient is medically ready for discharge today and patient will need to choose another bed. Patient chose [facility] as it is close the the main hospital campus.  CSW attempted to reach admissions at [place], but was told admissions is in a meeting and would follow up with CSW if the bed offer is still available.  Expected Discharge Plan: [place] Barriers to Discharge: Continued Medical Work up  Expected Discharge Plan and Services Expected Discharge Plan: [place] Discharge Planning Services: CM Consult Living arrangements for the past 2 months: Single Family Home  Readmission Risk Interventions Readmission Risk Prevention Plan 04/02/2021  [place] or Home Care Consult Complete  Social Work Consult for [place] Planning/Counseling Complete  Palliative Care Screening Not Applicable  Some recent data might be hidden

## 2021-04-02 NOTE — Progress Notes (Addendum)
[facility]  PROGRESS NOTE  [name] [name] [id number]:[id number] DOB: 01/04/1946 DOA: 03/08/2021 PCP: Janie Morning, DO   Brief HPI:   76 year old female with past medical history of hypothyroidism, diastolic CHF, stage IIIb CKD, mood disorder presented to ER on 03/08/2021 after multiple falls at home.  In the ED she was found to be hypoxemic and septic from pneumonia.  Following admission blood cultures were positive for MSSA.  MRI of thoracic and lumbar spine showed enhancement of lower thoracic and lumbar regions involving the dura as well as surface of conus and cauda equina nerve roots with some subdural collections in the lumbar spine.  Given enhancement of the leptomeninges, neurosurgery was consulted and the recommended infection has cleared to CNS and start antibiotics for CNS infection.  Infectious disease was consulted and patient started on Ancef.    Subjective   Patient seen and examined, complains of right wrist pain.  PICC line placed yesterday   Assessment/Plan:     Sepsis secondary to MSSA bacteremia/vertebral osteomyelitis -Patient has epidural, paraspinal, psoas abscess and septic arthritis of lumbar facet joints -Patient started on IV Ancef; day #25 of antibiotics -ID plans to complete 8 weeks of IV cefazolin followed by oral cefadroxil 1 g twice a day -Discussed with ID, no need to repeat MRI spine at this time -PICC line placed; awaiting to go to skilled nursing facility  Acute kidney injury on CKD 3a -Creatinine is 1.21, at baseline  Right wrist pain -Mild swelling and warmth noted in right wrist -No limitation of range of motion -X-ray of the wrist showed no acute osseous abnormality, severe CMC joint osteoarthritis -Continue as needed Tylenol, warm compresses  Acute urinary retention -Foley catheter in place  S/p fall at home -Imaging did not show any acute fractures  Chronic diastolic CHF -Euvolemic -Continue Lasix 20 mg p.o. daily  Oral  thrush -Unclear etiology -Resolved with fluconazole  Acute hypoxemic respiratory failure -Resolved  Acute metabolic encephalopathy/hypernatremia -Resolved  Diabetes mellitus type 2 -Continue sliding scale insulin with NovoLog -CBG well controlled  Hypothyroidism -Continue Synthroid     Medications     carbamazepine  200 mg Oral QHS   chlorhexidine  15 mL Mouth Rinse BID   Chlorhexidine Gluconate Cloth  6 each Topical Daily   feeding supplement (GLUCERNA SHAKE)  237 mL Oral TID BM   furosemide  20 mg Oral Daily   heparin  5,000 Units Subcutaneous Q8H   hydrocortisone   Topical BID   insulin aspart  0-5 Units Subcutaneous QHS   insulin aspart  0-9 Units Subcutaneous TID WC   lactulose  10 g Oral TID   levothyroxine  88 mcg Oral Q0600   mouth rinse  15 mL Mouth Rinse q12n4p   polyethylene glycol  17 g Oral BID   senna-docusate  1 tablet Oral BID   sodium chloride flush  10-40 mL Intracatheter Q12H   sodium chloride flush  3 mL Intravenous Q12H     Data Reviewed:   CBG:  Recent Labs  Lab 04/01/21 1215 04/01/21 1704 04/01/21 2232 04/02/21 0730 04/02/21 1155  GLUCAP 168* 131* 113* 129* 176*    SpO2: 98 % O2 Flow Rate (L/min): 2 L/min    Vitals:   04/01/21 1559 04/01/21 2305 04/02/21 0525 04/02/21 1309  BP: 134/80 140/78 (!) 149/73 (!) 131/57  Pulse: (!) 110 (!) 110 95 (!) 110  Resp: 18 17 16 16   Temp: 98.3 F (36.8 C) 98.5 F (36.9 C) 98.6 F (37 C)  98.9 F (37.2 C)  TempSrc: Oral Oral Oral   SpO2: 97% 100% 98% 98%  Weight:      Height:          Data Reviewed:  Basic Metabolic Panel: Recent Labs  Lab 03/27/21 0341 03/28/21 0339 04/01/21 0403  NA 138 136 137  K 4.6 4.7 4.3  CL 103 101 101  CO2 25 23 25   GLUCOSE 108* 95 114*  BUN 47* 50* 49*  CREATININE 1.31* 1.18* 1.21*  CALCIUM 9.4 9.4 10.0  MG 2.4 2.2 2.4    CBC: Recent Labs  Lab 03/27/21 0341 03/28/21 0339 04/01/21 0403  WBC 9.0 9.6 8.7  NEUTROABS 6.0 6.1  --   HGB  9.3* 10.3* 10.6*  HCT 28.9* 33.2* 32.7*  MCV 101.4* 103.8* 100.9*  PLT 327 296 310    LFT No results for input(s): AST, ALT, ALKPHOS, BILITOT, PROT, ALBUMIN in the last 168 hours.   Antibiotics: Anti-infectives (From admission, onward)    Start     Dose/Rate Route Frequency Ordered Stop   03/18/21 1000  fluconazole (DIFLUCAN) tablet 200 mg        200 mg Oral Daily 03/17/21 1426 03/25/21 0955   03/15/21 2200  ceFAZolin (ANCEF) IVPB 2g/100 mL premix        2 g 200 mL/hr over 30 Minutes Intravenous Every 8 hours 03/15/21 1853 05/05/21 2359   03/14/21 2200  ceFAZolin (ANCEF) IVPB 2g/100 mL premix  Status:  Discontinued        2 g 200 mL/hr over 30 Minutes Intravenous Every 12 hours 03/14/21 1223 03/15/21 1853   03/12/21 1215  fluconazole (DIFLUCAN) IVPB 200 mg  Status:  Discontinued        200 mg 100 mL/hr over 60 Minutes Intravenous Every 24 hours 03/12/21 1129 03/17/21 1426   03/11/21 0900  ceFAZolin (ANCEF) IVPB 2g/100 mL premix  Status:  Discontinued        2 g 200 mL/hr over 30 Minutes Intravenous Every 8 hours 03/11/21 0801 03/14/21 1223   03/11/21 0100  ceFAZolin (ANCEF) IVPB 2g/100 mL premix  Status:  Discontinued        2 g 200 mL/hr over 30 Minutes Intravenous Every 12 hours 03/10/21 1554 03/11/21 0801   03/10/21 1645  ceFAZolin (ANCEF) IVPB 2g/100 mL premix        2 g 200 mL/hr over 30 Minutes Intravenous  Once 03/10/21 1554 03/10/21 1729   03/09/21 0800  Ampicillin-Sulbactam (UNASYN) 3 g in sodium chloride 0.9 % 100 mL IVPB  Status:  Discontinued        3 g 200 mL/hr over 30 Minutes Intravenous Every 12 hours 03/09/21 0720 03/10/21 1553   03/09/21 0200  metroNIDAZOLE (FLAGYL) IVPB 500 mg  Status:  Discontinued        500 mg 100 mL/hr over 60 Minutes Intravenous Every 12 hours 03/08/21 1801 03/09/21 0720   03/08/21 2130  cefTRIAXone (ROCEPHIN) 2 g in sodium chloride 0.9 % 100 mL IVPB  Status:  Discontinued        2 g 200 mL/hr over 30 Minutes Intravenous Every 24 hours  03/08/21 2030 03/09/21 0720   03/08/21 2130  azithromycin (ZITHROMAX) 500 mg in sodium chloride 0.9 % 250 mL IVPB  Status:  Discontinued        500 mg 250 mL/hr over 60 Minutes Intravenous Every 24 hours 03/08/21 2030 03/09/21 0720   03/08/21 1415  ceFEPIme (MAXIPIME) 2 g in sodium chloride 0.9 % 100 mL IVPB  2 g 200 mL/hr over 30 Minutes Intravenous  Once 03/08/21 1406 03/08/21 1544   03/08/21 1415  metroNIDAZOLE (FLAGYL) IVPB 500 mg        500 mg 100 mL/hr over 60 Minutes Intravenous  Once 03/08/21 1406 03/08/21 1620   03/08/21 1415  vancomycin (VANCOCIN) IVPB 1000 mg/200 mL premix        1,000 mg 200 mL/hr over 60 Minutes Intravenous  Once 03/08/21 1406 03/08/21 1708        DVT prophylaxis: Heparin  Code Status: Full code  Family Communication: No family at bedside   CONSULTS infectious disease, neurosurgery   Objective    Physical Examination:   General-appears in no acute distress Heart-S1-S2, regular, no murmur auscultated Lungs-clear to auscultation bilaterally, no wheezing or crackles auscultated Abdomen-soft, nontender, no organomegaly Extremities-no edema in the lower extremities Neuro-alert, oriented x3, no focal deficit noted   Status is: Inpatient for MSSA bacteremia, vertebral osteomyelitis        [place]   [facility] If 7PM-7AM, please contact night-coverage at [email or web address], Office  [phone]   04/02/2021, 2:32 PM  LOS: 25 days

## 2021-04-02 NOTE — TOC Progression Note (Signed)
Transition of Care [facility]) - Progression Note    Patient Details  Name: [name] [name] MRN: [id number] Date of Birth: Apr 11, 1945  [facility] CM/SW Contact  Leeroy Cha, RN Phone Number: 04/02/2021, 8:23 AM  Clinical Narrative:    [facility] -no bed is available and will not be until next week sometime.   Expected Discharge Plan: [place] Barriers to Discharge: Continued Medical Work up  Expected Discharge Plan and Services Expected Discharge Plan: [place]   Discharge Planning Services: CM Consult   Living arrangements for the past 2 months: Single Family Home                                       Social Determinants of Health (SDOH) Interventions    Readmission Risk Interventions No flowsheet data found.

## 2021-04-03 DIAGNOSIS — E0810 Diabetes mellitus due to underlying condition with ketoacidosis without coma: Secondary | ICD-10-CM | POA: Diagnosis not present

## 2021-04-03 DIAGNOSIS — R279 Unspecified lack of coordination: Secondary | ICD-10-CM | POA: Diagnosis not present

## 2021-04-03 DIAGNOSIS — R2689 Other abnormalities of gait and mobility: Secondary | ICD-10-CM | POA: Diagnosis not present

## 2021-04-03 DIAGNOSIS — I509 Heart failure, unspecified: Secondary | ICD-10-CM | POA: Diagnosis not present

## 2021-04-03 DIAGNOSIS — I5032 Chronic diastolic (congestive) heart failure: Secondary | ICD-10-CM | POA: Diagnosis not present

## 2021-04-03 DIAGNOSIS — M6281 Muscle weakness (generalized): Secondary | ICD-10-CM | POA: Diagnosis not present

## 2021-04-03 DIAGNOSIS — Z79899 Other long term (current) drug therapy: Secondary | ICD-10-CM | POA: Diagnosis not present

## 2021-04-03 DIAGNOSIS — J189 Pneumonia, unspecified organism: Secondary | ICD-10-CM | POA: Diagnosis not present

## 2021-04-03 DIAGNOSIS — E785 Hyperlipidemia, unspecified: Secondary | ICD-10-CM | POA: Diagnosis not present

## 2021-04-03 DIAGNOSIS — M4620 Osteomyelitis of vertebra, site unspecified: Secondary | ICD-10-CM | POA: Diagnosis not present

## 2021-04-03 DIAGNOSIS — I5030 Unspecified diastolic (congestive) heart failure: Secondary | ICD-10-CM | POA: Diagnosis not present

## 2021-04-03 DIAGNOSIS — R202 Paresthesia of skin: Secondary | ICD-10-CM | POA: Diagnosis not present

## 2021-04-03 DIAGNOSIS — R7989 Other specified abnormal findings of blood chemistry: Secondary | ICD-10-CM | POA: Diagnosis not present

## 2021-04-03 DIAGNOSIS — E119 Type 2 diabetes mellitus without complications: Secondary | ICD-10-CM | POA: Diagnosis not present

## 2021-04-03 DIAGNOSIS — I10 Essential (primary) hypertension: Secondary | ICD-10-CM | POA: Diagnosis not present

## 2021-04-03 DIAGNOSIS — N1832 Chronic kidney disease, stage 3b: Secondary | ICD-10-CM | POA: Diagnosis not present

## 2021-04-03 DIAGNOSIS — R7881 Bacteremia: Secondary | ICD-10-CM | POA: Diagnosis not present

## 2021-04-03 DIAGNOSIS — G062 Extradural and subdural abscess, unspecified: Secondary | ICD-10-CM

## 2021-04-03 DIAGNOSIS — E039 Hypothyroidism, unspecified: Secondary | ICD-10-CM | POA: Diagnosis not present

## 2021-04-03 DIAGNOSIS — Y848 Other medical procedures as the cause of abnormal reaction of the patient, or of later complication, without mention of misadventure at the time of the procedure: Secondary | ICD-10-CM | POA: Diagnosis not present

## 2021-04-03 DIAGNOSIS — Z743 Need for continuous supervision: Secondary | ICD-10-CM | POA: Diagnosis not present

## 2021-04-03 DIAGNOSIS — N179 Acute kidney failure, unspecified: Secondary | ICD-10-CM | POA: Diagnosis not present

## 2021-04-03 DIAGNOSIS — Z79890 Hormone replacement therapy (postmenopausal): Secondary | ICD-10-CM | POA: Diagnosis not present

## 2021-04-03 DIAGNOSIS — R000 Tachycardia, unspecified: Secondary | ICD-10-CM | POA: Diagnosis not present

## 2021-04-03 DIAGNOSIS — R262 Difficulty in walking, not elsewhere classified: Secondary | ICD-10-CM | POA: Diagnosis not present

## 2021-04-03 DIAGNOSIS — A4101 Sepsis due to Methicillin susceptible Staphylococcus aureus: Secondary | ICD-10-CM | POA: Diagnosis not present

## 2021-04-03 DIAGNOSIS — L0291 Cutaneous abscess, unspecified: Secondary | ICD-10-CM | POA: Diagnosis not present

## 2021-04-03 DIAGNOSIS — D519 Vitamin B12 deficiency anemia, unspecified: Secondary | ICD-10-CM | POA: Diagnosis not present

## 2021-04-03 DIAGNOSIS — E118 Type 2 diabetes mellitus with unspecified complications: Secondary | ICD-10-CM | POA: Diagnosis not present

## 2021-04-03 DIAGNOSIS — T82524A Displacement of infusion catheter, initial encounter: Secondary | ICD-10-CM | POA: Diagnosis not present

## 2021-04-03 DIAGNOSIS — N189 Chronic kidney disease, unspecified: Secondary | ICD-10-CM | POA: Diagnosis not present

## 2021-04-03 DIAGNOSIS — R4189 Other symptoms and signs involving cognitive functions and awareness: Secondary | ICD-10-CM | POA: Diagnosis not present

## 2021-04-03 DIAGNOSIS — T82898A Other specified complication of vascular prosthetic devices, implants and grafts, initial encounter: Secondary | ICD-10-CM | POA: Diagnosis not present

## 2021-04-03 DIAGNOSIS — E1122 Type 2 diabetes mellitus with diabetic chronic kidney disease: Secondary | ICD-10-CM | POA: Diagnosis not present

## 2021-04-03 DIAGNOSIS — R410 Disorientation, unspecified: Secondary | ICD-10-CM | POA: Diagnosis not present

## 2021-04-03 LAB — GLUCOSE, CAPILLARY
Glucose-Capillary: 117 mg/dL — ABNORMAL HIGH (ref 70–99)
Glucose-Capillary: 140 mg/dL — ABNORMAL HIGH (ref 70–99)
Glucose-Capillary: 151 mg/dL — ABNORMAL HIGH (ref 70–99)
Glucose-Capillary: 177 mg/dL — ABNORMAL HIGH (ref 70–99)
Glucose-Capillary: 120 mg/dL — ABNORMAL HIGH (ref 70–99)

## 2021-04-03 LAB — RESP PANEL BY RT-PCR (FLU A&B, COVID) ARPGX2
Influenza A by PCR: NEGATIVE
Influenza B by PCR: NEGATIVE
SARS Coronavirus 2 by RT PCR: NEGATIVE

## 2021-04-03 MED ORDER — FUROSEMIDE 20 MG PO TABS: 20.0000 mg | ORAL_TABLET | Freq: Every day | ORAL | Status: DC

## 2021-04-03 MED ORDER — SENNOSIDES-DOCUSATE SODIUM 8.6-50 MG PO TABS
1.0000 | ORAL_TABLET | Freq: Two times a day (BID) | ORAL | Status: DC
Start: 2021-04-03 — End: 2023-10-29

## 2021-04-03 MED ORDER — OXYCODONE HCL 5 MG PO TABS: 5.0000 mg | ORAL_TABLET | Freq: Four times a day (QID) | ORAL | 0 refills | Status: DC | PRN

## 2021-04-03 MED ORDER — HEPARIN SOD (PORK) LOCK FLUSH 100 UNIT/ML IV SOLN
250.0000 [IU] | INTRAVENOUS | Status: AC | PRN
  Administered 2021-04-03: 250 [IU]

## 2021-04-03 MED ORDER — CEFAZOLIN IV (FOR PTA / DISCHARGE USE ONLY): 2.0000 g | Freq: Three times a day (TID) | INTRAVENOUS | 0 refills | Status: AC

## 2021-04-03 MED ORDER — LACTULOSE 10 GM/15ML PO SOLN: 10.0000 g | Freq: Two times a day (BID) | ORAL | 0 refills | Status: DC | PRN

## 2021-04-03 MED ORDER — ACETAMINOPHEN 325 MG PO TABS: 650.0000 mg | ORAL_TABLET | Freq: Four times a day (QID) | ORAL | Status: DC | PRN

## 2021-04-03 MED ORDER — GLUCERNA SHAKE PO LIQD: 237.0000 mL | Freq: Three times a day (TID) | ORAL | 0 refills | Status: DC

## 2021-04-03 NOTE — Progress Notes (Signed)
°    Wing for Infectious Disease   Reason for visit: Follow up on paraspinal infection  Interval History: going to SNF for continued care soon  Physical Exam: Constitutional:  Vitals:   04/02/21 2124 04/03/21 0457  BP: (!) 146/74 136/72  Pulse: 100 100  Resp: 16 16  Temp: 98.6 F (37 C) 98.1 F (36.7 C)  SpO2: 99% 98%   patient appears in NAD  Impression: Stable epidural and paraspinal abscess, septic arthritis of facet joints.  Plan: 1.  Cefazolin IV through May 05 2021 likely followed by prolonged oral cefadroxil  Will plan on follow up MRI to assess progress after 3-4 weeks She has follow up with Dr. Gale Journey scheduled in March.    I will sign off, call with questions.

## 2021-04-03 NOTE — Discharge Summary (Signed)
Physician Discharge Summary   Patient: [name] [name] MRN: [id number] DOB: 03/13/1945  Admit date:     03/08/2021  Discharge date: 04/03/21  Discharge Physician: Oswald Hillock   PCP: Janie Morning, DO   Recommendations at discharge:    Continue IV cefazolin antibiotic till May 05, 2021 Then start taking cefadroxil 1 g p.o. twice daily starting May 06, 2021, check with ID regarding duration of p.o. antibiotics Follow-up Dr. Gale Journey, infectious disease in 3 weeks Patient will need MRI of cervical, thoracic and lumbar spine in 3 to 4 weeks, infectious disease to order MRI as outpatient  Discharge Diagnoses: Active Problems:   Chronic diastolic heart failure (HCC)   Sepsis (HCC)   Acute renal failure superimposed on stage 3b chronic kidney disease (HCC)   Elevated LFTs   DMII (diabetes mellitus, type 2) (HCC)   Normocytic anemia   Fall at home, initial encounter   Hypothyroidism   MSSA bacteremia   Morbid obesity ([place])   Thrush, oral   Vertebral osteomyelitis ([place])   Abscess   Acute urinary retention   Generalized weakness  Resolved Problems:   Hyponatremia   Acute respiratory failure with hypoxia (HCC)   Acute metabolic encephalopathy   Hypernatremia   Hospital Course:  76 year old female with past medical history of hypothyroidism, diastolic CHF, stage IIIb CKD, mood disorder presented to ER on 03/08/2021 after multiple falls at home.  In the ED she was found to be hypoxemic and septic from pneumonia.  Following admission blood cultures were positive for MSSA.  MRI of thoracic and lumbar spine showed enhancement of lower thoracic and lumbar regions involving the dura as well as surface of conus and cauda equina nerve roots with some subdural collections in the lumbar spine.  Given enhancement of the leptomeninges, neurosurgery was consulted and the recommended infection has cleared to CNS and start antibiotics for CNS infection.  Infectious disease was consulted and patient  started on Ancef.    Assessment and Plan:  Sepsis secondary to MSSA bacteremia/vertebral osteomyelitis -Patient has epidural, paraspinal, psoas abscess and septic arthritis of lumbar facet joints -Patient started on IV Ancef; day #25 of antibiotics -ID plans to complete 8 weeks of IV cefazolin till May 05, 2021, followed by oral cefadroxil 1 g twice a day.  Patient will follow-up with ID Dr. Gale Journey in March. -Discussed with ID, no need to repeat MRI spine at this time.  MRI of cervical thoracic and lumbar spine in 3 to 4 weeks.  Infectious disease will order MRI as outpatient -PICC line placed;    Acute renal failure superimposed on stage 3b chronic kidney disease ([place])- (present on admission) - patient has history of CKD3b. Baseline creat ~ 1.3, eGFR 38 -Creatinine on admission at 2.47 and with fluids, had improved, down to 1.45 on 1/29.   -Creatinine improved to 1.21; at baseline -She developed acute urinary retention, bilateral hydronephrosis noted on MRI.  She had Foley catheter placed in the hospital which has been removed.  Patient voiding on her own -MRI to be repeated in 4 weeks, if still shows hydronephrosis, follow-up urology as outpatient    Right wrist pain -Mild swelling and warmth noted in right wrist -No limitation of range of motion -X-ray of the wrist showed no acute osseous abnormality, severe CMC joint osteoarthritis -Continue as needed Tylenol, warm compresses     S/p fall at home -Imaging did not show any acute fractures   Chronic diastolic CHF -Euvolemic -Continue Lasix 20 mg p.o. daily  Oral thrush -Unclear etiology -Resolved with fluconazole   Acute hypoxemic respiratory failure -Resolved   Acute metabolic encephalopathy/hypernatremia -Resolved   Diabetes mellitus type 2 -Hemoglobin A1c is 6.6% -CBG well controlled -Not on medications at home   Hypothyroidism -Continue Synthroid    Hyponatremia -Resolved            Consultants:  Infectious disease, neurosurgery Procedures performed:  Disposition: Skilled nursing facility Diet recommendation:  Discharge Diet Orders (From admission, onward)     Start     Ordered   04/03/21 0000  Diet general       Comments: Dysphagia 3 diet   04/03/21 1201           Dysphagia type 3   Liquid thin yellow.  I do not about the clinic check with the nursing staff do not know much about that sorry  DISCHARGE MEDICATION: Allergies as of 04/03/2021   No Known Allergies      Medication List     STOP taking these medications    b complex vitamins tablet   Biotin 5000 MCG Tabs   HYDROcodone-acetaminophen 7.5-325 MG tablet Commonly known as: NORCO   predniSONE 10 MG tablet Commonly known as: DELTASONE   Vitamin D-3 25 MCG (1000 UT) Caps       TAKE these medications    acetaminophen 325 MG tablet Commonly known as: TYLENOL Take 2 tablets (650 mg total) by mouth every 6 (six) hours as needed for mild pain (or Fever >/= 101).   anastrozole 1 MG tablet Commonly known as: ARIMIDEX Take 1 tablet (1 mg total) by mouth daily.   B-12 5000 MCG Subl Place 1 tablet under the tongue every morning.   carbamazepine 200 MG 12 hr tablet Commonly known as: TEGRETOL XR Take 200 mg by mouth at bedtime.   ceFAZolin  IVPB Commonly known as: ANCEF Inject 2 g into the vein every 8 (eight) hours. Indication:  MSSA Vertebral Osteomyelitis First Dose: Yes Last Day of Therapy:  March 21st, 2023 Labs - Once weekly:  CBC/D and BMP, Labs - Every other week:  ESR and CRP   Co Q-10 300 MG Caps Take 300 mg by mouth at bedtime.   feeding supplement (GLUCERNA SHAKE) Liqd Take 237 mLs by mouth 3 (three) times daily between meals.   Fish Oil 1200 MG Caps Take 1,200 mg by mouth daily at 6 (six) AM.   furosemide 20 MG tablet Commonly known as: LASIX Take 1 tablet (20 mg total) by mouth daily. Start taking on: April 04, 2021 What changed:  medication strength how much to  take   hydrocortisone 2.5 % rectal cream Commonly known as: ANUSOL-HC Place 1 application rectally 2 (two) times daily.   lactulose 10 GM/15ML solution Commonly known as: CHRONULAC Take 15 mLs (10 g total) by mouth 2 (two) times daily as needed for mild constipation.   levothyroxine 88 MCG tablet Commonly known as: SYNTHROID Take 88 mcg by mouth daily before breakfast.   Magnesium 400 MG Tabs Take 400 mg by mouth daily.   oxyCODONE 5 MG immediate release tablet Commonly known as: Oxy IR/ROXICODONE Take 1 tablet (5 mg total) by mouth every 6 (six) hours as needed for severe pain.   rosuvastatin 5 MG tablet Commonly known as: CRESTOR Take 1 tablet (5 mg total) by mouth daily.   senna-docusate 8.6-50 MG tablet Commonly known as: Senokot-S Take 1 tablet by mouth 2 (two) times daily.   triamcinolone cream 0.1 % Commonly known as:  KENALOG Apply 1 application topically 2 (two) times daily.               Home Infusion Instuctions  (From admission, onward)           Start     Ordered   04/03/21 0000  Home infusion instructions       Question:  Instructions  Answer:  Flushing of vascular access device: 0.9% NaCl pre/post medication administration and prn patency; Heparin 100 u/ml, 64m for implanted ports and Heparin 10u/ml, 551mfor all other central venous catheters.   04/03/21 1201            Follow-up Information     Vu, TrRockey SituMD. Schedule an appointment as soon as possible for a visit in 3 week(s).   Specialty: Infectious Diseases Contact information: [address] [place] [id number]               Discharge Exam: FiDanley Dankereights   03/08/21 2041  Weight: 85.5 kg  General-appears in no acute distress Heart-S1-S2, regular, no murmur auscultated Lungs-clear to auscultation bilaterally, no wheezing or crackles auscultated Abdomen-soft, nontender, no organomegaly Extremities-no edema in the lower  extremities Neuro-alert, oriented x3, no focal deficit noted  Condition at discharge: good  The results of significant diagnostics from this hospitalization (including imaging, microbiology, ancillary and laboratory) are listed below for reference.   Imaging Studies: DG Shoulder 1V Left  Result Date: 03/09/2021 CLINICAL DATA:  Left shoulder pain EXAM: LEFT SHOULDER COMPARISON:  None. FINDINGS: There is no evidence of fracture or dislocation. There is no evidence of arthropathy or other focal bone abnormality. Soft tissues are unremarkable. IMPRESSION: No acute osseous abnormality identified. Electronically Signed   By: DeOfilia Neas.D.   On: 03/09/2021 10:54   DG Wrist 2 Views Right  Result Date: 04/02/2021 CLINICAL DATA:  Lateral wrist pain.  No injury. EXAM: RIGHT WRIST - 2 VIEW COMPARISON:  Right wrist x-rays dated March 08, 2021. FINDINGS: No acute fracture or dislocation. Unchanged severe first CMC joint and mild scaphotrapeziotrapezoid joint osteoarthritis. Osteopenia. Soft tissues are unremarkable. IMPRESSION: 1. No acute osseous abnormality. 2. Unchanged severe first CMC joint osteoarthritis. Electronically Signed   By: WiTitus Dubin.D.   On: 04/02/2021 12:32   DG Wrist Complete Right  Result Date: 03/08/2021 CLINICAL DATA:  Patient status post fall.  Right wrist pain. EXAM: RIGHT WRIST - COMPLETE 3+ VIEW COMPARISON:  None. FINDINGS: First MCP joint degenerative changes. Normal anatomic alignment. No evidence for acute fracture or dislocation. IMPRESSION: First MCP joint degenerative changes.  No acute osseous abnormality. Electronically Signed   By: DrLovey Newcomer.D.   On: 03/08/2021 15:03   CT Head Wo Contrast  Result Date: 03/08/2021 CLINICAL DATA:  Head trauma multiple fall EXAM: CT HEAD WITHOUT CONTRAST CT CERVICAL SPINE WITHOUT CONTRAST TECHNIQUE: Multidetector CT imaging of the head and cervical spine was performed following the standard protocol without intravenous  contrast. Multiplanar CT image reconstructions of the cervical spine were also generated. RADIATION DOSE REDUCTION: This exam was performed according to the departmental dose-optimization program which includes automated exposure control, adjustment of the mA and/or kV according to patient size and/or use of iterative reconstruction technique. COMPARISON:  None. FINDINGS: CT HEAD FINDINGS Brain: No acute territorial infarction, hemorrhage or intracranial mass. Moderate atrophy. The ventricles are nonenlarged Vascular: No hyperdense vessels.  No unexpected calcification Skull: Normal. Negative for fracture or focal lesion. Sinuses/Orbits: No acute finding. Other:  None CT CERVICAL SPINE FINDINGS Alignment: Mild reversal of cervical lordosis. 4 mm anterolisthesis C3 on C4. Facet alignment is maintained. Skull base and vertebrae: No acute fracture. No primary bone lesion or focal pathologic process. Soft tissues and spinal canal: No prevertebral fluid or swelling. No visible canal hematoma. Disc levels: Diffuse degenerative changes throughout the cervical spine. Advanced disc space narrowing and degenerative change C4 through T1. Hypertrophic facet degenerative changes left greater than right with multiple level foraminal narrowing. Upper chest: Negative. Other: None IMPRESSION: 1. No CT evidence for acute intracranial abnormality.  Atrophy. 2. Anterolisthesis C3 on C4 probably due to degenerative change. No definitive fracture is seen. Advanced degenerative changes at multiple levels in the cervical spine. Electronically Signed   By: Donavan Foil M.D.   On: 03/08/2021 16:38   CT Chest Wo Contrast  Result Date: 03/08/2021 CLINICAL DATA:  Shortness of breath EXAM: CT CHEST WITHOUT CONTRAST TECHNIQUE: Multidetector CT imaging of the chest was performed following the standard protocol without IV contrast. RADIATION DOSE REDUCTION: This exam was performed according to the departmental dose-optimization program which  includes automated exposure control, adjustment of the mA and/or kV according to patient size and/or use of iterative reconstruction technique. COMPARISON:  Chest x-ray 03/08/2021 FINDINGS: Cardiovascular: Limited evaluation without intravenous contrast. Mild aortic atherosclerosis. No aneurysm. Borderline cardiac size. No pericardial effusion Mediastinum/Nodes: Midline trachea. No thyroid mass. No suspicious lymph nodes. Esophagus within normal limits. Lungs/Pleura: Trace bilateral effusions. Mild bilateral lower lobe bronchial wall thickening. Partial consolidations at the lung bases probably due to atelectasis. Mild interstitial densities within the peripheral upper lobes possibly due to chronic disease. Possible 7 mm left apical lung nodule, series 7, image 21. Upper Abdomen: No acute abnormality. Musculoskeletal: No chest wall mass or suspicious bone lesions identified. IMPRESSION: 1. Trace bilateral pleural effusions. Mild bilateral lower lobe bronchial wall thickening which could be due to inflammatory process. Partial consolidations in the lower lobes could reflect atelectasis or mild pneumonia. 2. Possible 7 mm left apical lung nodule. Non-contrast chest CT at 6-12 months is recommended. If the nodule is stable at time of repeat CT, then future CT at 18-24 months (from today's scan) is considered optional for low-risk patients, but is recommended for high-risk patients. This recommendation follows the consensus statement: Guidelines for Management of Incidental Pulmonary Nodules Detected on CT Images: From the Fleischner Society 2017; Radiology 2017; 284:228-243. Aortic Atherosclerosis (ICD10-I70.0). Electronically Signed   By: Donavan Foil M.D.   On: 03/08/2021 16:46   CT Cervical Spine Wo Contrast  Result Date: 03/08/2021 CLINICAL DATA:  Head trauma multiple fall EXAM: CT HEAD WITHOUT CONTRAST CT CERVICAL SPINE WITHOUT CONTRAST TECHNIQUE: Multidetector CT imaging of the head and cervical spine was  performed following the standard protocol without intravenous contrast. Multiplanar CT image reconstructions of the cervical spine were also generated. RADIATION DOSE REDUCTION: This exam was performed according to the departmental dose-optimization program which includes automated exposure control, adjustment of the mA and/or kV according to patient size and/or use of iterative reconstruction technique. COMPARISON:  None. FINDINGS: CT HEAD FINDINGS Brain: No acute territorial infarction, hemorrhage or intracranial mass. Moderate atrophy. The ventricles are nonenlarged Vascular: No hyperdense vessels.  No unexpected calcification Skull: Normal. Negative for fracture or focal lesion. Sinuses/Orbits: No acute finding. Other: None CT CERVICAL SPINE FINDINGS Alignment: Mild reversal of cervical lordosis. 4 mm anterolisthesis C3 on C4. Facet alignment is maintained. Skull base and vertebrae: No acute fracture. No primary bone lesion or focal pathologic process. Soft  tissues and spinal canal: No prevertebral fluid or swelling. No visible canal hematoma. Disc levels: Diffuse degenerative changes throughout the cervical spine. Advanced disc space narrowing and degenerative change C4 through T1. Hypertrophic facet degenerative changes left greater than right with multiple level foraminal narrowing. Upper chest: Negative. Other: None IMPRESSION: 1. No CT evidence for acute intracranial abnormality.  Atrophy. 2. Anterolisthesis C3 on C4 probably due to degenerative change. No definitive fracture is seen. Advanced degenerative changes at multiple levels in the cervical spine. Electronically Signed   By: Donavan Foil M.D.   On: 03/08/2021 16:38   MR THORACIC SPINE W WO CONTRAST  Result Date: 03/16/2021 CLINICAL DATA:  Mid-back pain, infection suspected, no prior imaging; Low back pain, infection suspected, no prior imaging. Bacteremia. Multiple falls. EXAM: MRI THORACIC AND LUMBAR SPINE WITHOUT AND WITH CONTRAST TECHNIQUE:  Multiplanar and multiecho pulse sequences of the thoracic and lumbar spine were obtained without and with intravenous contrast. CONTRAST:  8.40m GADAVIST GADOBUTROL 1 MMOL/ML IV SOLN COMPARISON:  Chest and cervical spine CTs 03/08/2021. Lumbar spine MRI 06/18/2011. FINDINGS: MRI THORACIC SPINE FINDINGS Alignment:  Trace stepwise anterolisthesis from C7-T1 to T3-4. Vertebrae: No fracture, suspicious marrow lesion, or evidence of discitis. Left facet edema and enhancement at T1-2. Cord: Abnormal dorsal greater than ventral epidural enhancement throughout the mid and lower thoracic spine. Abnormal enhancement along the surface of the spinal cord from T9 to the conus. No definite spinal cord edema. Paraspinal and other soft tissues: Small left pleural effusion. Signal abnormality in the dependent portions of both lower lobes. No paraspinal fluid collection. Disc levels: Moderate diffuse thoracic disc and facet degeneration. Small central disc protrusions at T5-6 and T7-8, a small right paracentral disc protrusion at T8-9, and disc bulging at T11-12 result in at most a mild impression on the spinal cord without significant spinal stenosis. No evidence of high-grade neural foraminal stenosis. MRI LUMBAR SPINE FINDINGS Segmentation:  Standard. Alignment: Mild-to-moderate lumbar levoscoliosis. Trace anterolisthesis of L4 on L5. Vertebrae: No fracture. Minimal fluid signal in the left lateral aspect of the L5-S1 disc, favored to be degenerative rather than infectious. Fluid in the majority of the lumbar facet joints bilaterally, most notable on the right at L2-3 and bilaterally at L3-4 and L4-5. Mild right-sided facet edema at L3-4 and L4-5. Conus medullaris: Extends to the L1 level. There is abnormal enhancement along the surface of the conus medullaris and diffusely along the cauda equina nerve roots. The cauda equina/thecal sac is distorted throughout the lumbar spine, and there is relatively widespread abnormal epidural  enhancement as well as possible loculated subdural fluid collections contributing to this appearance. Paraspinal and other soft tissues: Edema and complex fluid collections are present in the right-sided lumbar posterior paraspinal musculature including a dominant, septated collection measuring approximately 6 cm in craniocaudal dimension (series 6, image 3). Fluid collections are also partially visualized within the left psoas muscle measuring up to approximately 3.5 x 3 cm on axial images. There are also innumerable predominantly subcentimeter T2 hyperintense, nonenhancing foci throughout both kidneys. Several scattered small T1 hyperintense/complex cystic lesions are also present in the left kidney. There is marked distention of the bladder, and there is moderate bilateral hydronephrosis. Disc levels: Severe spinal stenosis at L3-4 and L4-5 due to the above described epidural process. Moderate to severe right neural foraminal stenosis at L4-5. IMPRESSION: 1. Abnormal epidural enhancement throughout the lumbar spine with cranial extension into the midthoracic spine consistent with infection. Suspected associated loculated subdural fluid collections in the  lumbar spine as well as leptomeningeal/subarachnoid space infection in the lumbar and thoracic spine. 2. Abscesses involving the right-sided lumbar posterior paraspinal musculature and left psoas muscle. 3. Potential septic arthritis of multiple lumbar facet joints, most suspiciously right L3-4 and L4-5. 4. Septic versus degenerative arthritis of the left T1-2 facet joint. 5. Innumerable small nonenhancing/cystic foci throughout both kidneys. Renal septic emboli not excluded. 6. Marked distention of the bladder with moderate bilateral hydronephrosis. These results will be called to the ordering clinician or representative by the Radiologist Assistant, and communication documented in the PACS or [employer]. Electronically Signed   By: Logan Bores M.D.   On:  03/16/2021 15:39   MR Lumbar Spine W Wo Contrast  Result Date: 03/16/2021 CLINICAL DATA:  Mid-back pain, infection suspected, no prior imaging; Low back pain, infection suspected, no prior imaging. Bacteremia. Multiple falls. EXAM: MRI THORACIC AND LUMBAR SPINE WITHOUT AND WITH CONTRAST TECHNIQUE: Multiplanar and multiecho pulse sequences of the thoracic and lumbar spine were obtained without and with intravenous contrast. CONTRAST:  8.34m GADAVIST GADOBUTROL 1 MMOL/ML IV SOLN COMPARISON:  Chest and cervical spine CTs 03/08/2021. Lumbar spine MRI 06/18/2011. FINDINGS: MRI THORACIC SPINE FINDINGS Alignment:  Trace stepwise anterolisthesis from C7-T1 to T3-4. Vertebrae: No fracture, suspicious marrow lesion, or evidence of discitis. Left facet edema and enhancement at T1-2. Cord: Abnormal dorsal greater than ventral epidural enhancement throughout the mid and lower thoracic spine. Abnormal enhancement along the surface of the spinal cord from T9 to the conus. No definite spinal cord edema. Paraspinal and other soft tissues: Small left pleural effusion. Signal abnormality in the dependent portions of both lower lobes. No paraspinal fluid collection. Disc levels: Moderate diffuse thoracic disc and facet degeneration. Small central disc protrusions at T5-6 and T7-8, a small right paracentral disc protrusion at T8-9, and disc bulging at T11-12 result in at most a mild impression on the spinal cord without significant spinal stenosis. No evidence of high-grade neural foraminal stenosis. MRI LUMBAR SPINE FINDINGS Segmentation:  Standard. Alignment: Mild-to-moderate lumbar levoscoliosis. Trace anterolisthesis of L4 on L5. Vertebrae: No fracture. Minimal fluid signal in the left lateral aspect of the L5-S1 disc, favored to be degenerative rather than infectious. Fluid in the majority of the lumbar facet joints bilaterally, most notable on the right at L2-3 and bilaterally at L3-4 and L4-5. Mild right-sided facet edema at  L3-4 and L4-5. Conus medullaris: Extends to the L1 level. There is abnormal enhancement along the surface of the conus medullaris and diffusely along the cauda equina nerve roots. The cauda equina/thecal sac is distorted throughout the lumbar spine, and there is relatively widespread abnormal epidural enhancement as well as possible loculated subdural fluid collections contributing to this appearance. Paraspinal and other soft tissues: Edema and complex fluid collections are present in the right-sided lumbar posterior paraspinal musculature including a dominant, septated collection measuring approximately 6 cm in craniocaudal dimension (series 6, image 3). Fluid collections are also partially visualized within the left psoas muscle measuring up to approximately 3.5 x 3 cm on axial images. There are also innumerable predominantly subcentimeter T2 hyperintense, nonenhancing foci throughout both kidneys. Several scattered small T1 hyperintense/complex cystic lesions are also present in the left kidney. There is marked distention of the bladder, and there is moderate bilateral hydronephrosis. Disc levels: Severe spinal stenosis at L3-4 and L4-5 due to the above described epidural process. Moderate to severe right neural foraminal stenosis at L4-5. IMPRESSION: 1. Abnormal epidural enhancement throughout the lumbar spine with cranial extension into  the midthoracic spine consistent with infection. Suspected associated loculated subdural fluid collections in the lumbar spine as well as leptomeningeal/subarachnoid space infection in the lumbar and thoracic spine. 2. Abscesses involving the right-sided lumbar posterior paraspinal musculature and left psoas muscle. 3. Potential septic arthritis of multiple lumbar facet joints, most suspiciously right L3-4 and L4-5. 4. Septic versus degenerative arthritis of the left T1-2 facet joint. 5. Innumerable small nonenhancing/cystic foci throughout both kidneys. Renal septic emboli not  excluded. 6. Marked distention of the bladder with moderate bilateral hydronephrosis. These results will be called to the ordering clinician or representative by the Radiologist Assistant, and communication documented in the PACS or [employer]. Electronically Signed   By: Logan Bores M.D.   On: 03/16/2021 15:39   DG Chest Port 1 View  Result Date: 03/08/2021 CLINICAL DATA:  Sepsis. EXAM: PORTABLE CHEST 1 VIEW COMPARISON:  None FINDINGS: Normal heart size. No pleural effusion or edema. Lung volumes are low. No airspace opacities. Visualized osseous structures appear intact. IMPRESSION: 1. Low lung volumes. 2. No acute findings. Electronically Signed   By: Kerby Moors M.D.   On: 03/08/2021 14:21   DG Abd Portable 1V  Result Date: 03/19/2021 CLINICAL DATA:  Constipation EXAM: PORTABLE ABDOMEN - 1 VIEW COMPARISON:  None. FINDINGS: Nonobstructive pattern of bowel gas. Large burden of stool and stool balls throughout the colon and rectum. No free air on supine radiograph. IMPRESSION: Nonobstructive pattern of bowel gas. Large burden of stool and stool balls throughout the colon and rectum. Electronically Signed   By: Delanna Ahmadi M.D.   On: 03/19/2021 14:25   ECHOCARDIOGRAM COMPLETE  Result Date: 03/09/2021    ECHOCARDIOGRAM REPORT   Patient Name:   TAMBRA MULLER Date of Exam: 03/09/2021 Medical Rec #:  [id number]       Height:       60.0 in Accession #:    [id number]      Weight:       188.5 lb Date of Birth:  11-10-1945       BSA:          1.820 m Patient Age:    76 years        BP:           170/68 mmHg Patient Gender: F               HR:           110 bpm. Exam Location:  Inpatient Procedure: 2D Echo Indications:    Bacteremia  History:        Patient has no prior history of Echocardiogram examinations.  Sonographer:    Jefferey Pica Referring Phys: [place]  Sonographer Comments: Image acquisition challenging due to respiratory motion and Image acquisition challenging due to patient  body habitus. IMPRESSIONS  1. Left ventricular ejection fraction, by estimation, is 60 to 65%. The left ventricle has normal function. The left ventricle has no regional wall motion abnormalities. There is mild left ventricular hypertrophy. Left ventricular diastolic parameters were normal.  2. Right ventricular systolic function is normal. The right ventricular size is normal.  3. The mitral valve is normal in structure. Trivial mitral valve regurgitation. No evidence of mitral stenosis.  4. The aortic valve is normal in structure. Aortic valve regurgitation is not visualized. No aortic stenosis is present.  5. The inferior vena cava is normal in size with greater than 50% respiratory variability, suggesting right atrial pressure of 3 mmHg. FINDINGS  Left Ventricle:  Left ventricular ejection fraction, by estimation, is 60 to 65%. The left ventricle has normal function. The left ventricle has no regional wall motion abnormalities. The left ventricular internal cavity size was normal in size. There is  mild left ventricular hypertrophy. Left ventricular diastolic parameters were normal. Right Ventricle: The right ventricular size is normal. No increase in right ventricular wall thickness. Right ventricular systolic function is normal. Left Atrium: Left atrial size was normal in size. Right Atrium: Right atrial size was normal in size. Pericardium: There is no evidence of pericardial effusion. Mitral Valve: The mitral valve is normal in structure. Trivial mitral valve regurgitation. No evidence of mitral valve stenosis. Tricuspid Valve: The tricuspid valve is not well visualized. Tricuspid valve regurgitation is not demonstrated. No evidence of tricuspid stenosis. Aortic Valve: The aortic valve is normal in structure. Aortic valve regurgitation is not visualized. No aortic stenosis is present. Aortic valve peak gradient measures 5.0 mmHg. Pulmonic Valve: The pulmonic valve was not well visualized. Pulmonic valve  regurgitation is not visualized. No evidence of pulmonic stenosis. Aorta: The aortic root is normal in size and structure. Venous: The inferior vena cava is normal in size with greater than 50% respiratory variability, suggesting right atrial pressure of 3 mmHg. IAS/Shunts: The interatrial septum was not well visualized.  LEFT VENTRICLE PLAX 2D LVIDd:         4.40 cm   Diastology LVIDs:         2.60 cm   LV e' medial:    6.45 cm/s LV PW:         1.10 cm   LV E/e' medial:  14.0 LV IVS:        1.20 cm   LV e' lateral:   7.94 cm/s LVOT diam:     1.90 cm   LV E/e' lateral: 11.3 LV SV:         59 LV SV Index:   32 LVOT Area:     2.84 cm  IVC IVC diam: 1.20 cm LEFT ATRIUM             Index        RIGHT ATRIUM          Index LA diam:        3.40 cm 1.87 cm/m   RA Area:     6.74 cm LA Vol (A2C):   22.0 ml 12.09 ml/m  RA Volume:   12.20 ml 6.70 ml/m LA Vol (A4C):   39.7 ml 21.81 ml/m LA Biplane Vol: 31.1 ml 17.09 ml/m  AORTIC VALVE                 PULMONIC VALVE AV Area (Vmax): 2.71 cm     PV Vmax:       0.88 m/s AV Vmax:        112.00 cm/s  PV Peak grad:  3.1 mmHg AV Peak Grad:   5.0 mmHg LVOT Vmax:      107.00 cm/s LVOT Vmean:     71.800 cm/s LVOT VTI:       0.208 m  AORTA Ao Asc diam: 3.10 cm MITRAL VALVE MV Area (PHT): 5.84 cm     SHUNTS MV Decel Time: 130 msec     Systemic VTI:  0.21 m MV E velocity: 90.00 cm/s   Systemic Diam: 1.90 cm MV A velocity: 113.00 cm/s MV E/A ratio:  0.80 Jenkins Rouge MD Electronically signed by Jenkins Rouge MD Signature Date/Time: 03/09/2021/4:40:55 PM    Final  [place] EKG SITE RITE  Result Date: 04/01/2021 If Site Rite image not attached, placement could not be confirmed due to current cardiac rhythm.   Microbiology: Results for orders placed or performed during the hospital encounter of 03/08/21  Blood Culture (routine x 2)     Status: Abnormal   Collection Time: 03/08/21  2:37 PM   Specimen: BLOOD  Result Value Ref Range Status   Specimen Description   Final    BLOOD  LEFT ANTECUBITAL Performed at [facility] [address], [place], [place] [address]    Special Requests   Final    BOTTLES DRAWN AEROBIC AND ANAEROBIC Blood Culture results may not be optimal due to an excessive volume of blood received in culture bottles Performed at [facility] [address] [place], [place] [address]    Culture  Setup Time   Final    GRAM POSITIVE COCCI IN BOTH AEROBIC AND ANAEROBIC BOTTLES CRITICAL RESULT CALLED TO, READ BACK BY AND VERIFIED WITH: M LILLISTON,[email or web address]@[id number]  03/09/21 [place] Performed at [facility], [place] [address] [place],  [address]    Culture STAPHYLOCOCCUS AUREUS (A)  Final   Report Status 03/11/2021 FINAL  Final   Organism ID, Bacteria STAPHYLOCOCCUS AUREUS  Final      Susceptibility   Staphylococcus aureus - MIC*    CIPROFLOXACIN <=0.5 SENSITIVE Sensitive     ERYTHROMYCIN <=0.25 SENSITIVE Sensitive     GENTAMICIN <=0.5 SENSITIVE Sensitive     OXACILLIN 0.5 SENSITIVE Sensitive     TETRACYCLINE <=1 SENSITIVE Sensitive     VANCOMYCIN <=0.5 SENSITIVE Sensitive     TRIMETH/SULFA <=10 SENSITIVE Sensitive     CLINDAMYCIN <=0.25 SENSITIVE Sensitive     RIFAMPIN <=0.5 SENSITIVE Sensitive     Inducible Clindamycin NEGATIVE Sensitive     * STAPHYLOCOCCUS AUREUS  Blood Culture ID Panel (Reflexed)     Status: Abnormal   Collection Time: 03/08/21  2:37 PM  Result Value Ref Range Status   Enterococcus faecalis NOT DETECTED NOT DETECTED Final   Enterococcus Faecium NOT DETECTED NOT DETECTED Final   Listeria monocytogenes NOT DETECTED NOT DETECTED Final   Staphylococcus species DETECTED (A) NOT DETECTED Final    Comment: CRITICAL RESULT CALLED TO, READ BACK BY AND VERIFIED WITH: M LILLISTON,[email or web address]@[id number]  03/09/21 [place]    Staphylococcus aureus (BCID) DETECTED (A) NOT DETECTED Final    Comment: CRITICAL RESULT CALLED TO, READ BACK BY AND VERIFIED WITH: M LILLISTON,[email or web address]@[id number]  03/09/21 [place]     Staphylococcus epidermidis NOT DETECTED NOT DETECTED Final   Staphylococcus lugdunensis NOT DETECTED NOT DETECTED Final   Streptococcus species NOT DETECTED NOT DETECTED Final   Streptococcus agalactiae NOT DETECTED NOT DETECTED Final   Streptococcus pneumoniae NOT DETECTED NOT DETECTED Final   Streptococcus pyogenes NOT DETECTED NOT DETECTED Final   A.calcoaceticus-baumannii NOT DETECTED NOT DETECTED Final   Bacteroides fragilis NOT DETECTED NOT DETECTED Final   Enterobacterales NOT DETECTED NOT DETECTED Final   Enterobacter cloacae complex NOT DETECTED NOT DETECTED Final   Escherichia coli NOT DETECTED NOT DETECTED Final   Klebsiella aerogenes NOT DETECTED NOT DETECTED Final   Klebsiella oxytoca NOT DETECTED NOT DETECTED Final   Klebsiella pneumoniae NOT DETECTED NOT DETECTED Final   Proteus species NOT DETECTED NOT DETECTED Final   Salmonella species NOT DETECTED NOT DETECTED Final   Serratia marcescens NOT DETECTED NOT DETECTED Final   Haemophilus influenzae NOT DETECTED NOT DETECTED Final   Neisseria meningitidis NOT DETECTED NOT DETECTED Final   Pseudomonas  aeruginosa NOT DETECTED NOT DETECTED Final   Stenotrophomonas maltophilia NOT DETECTED NOT DETECTED Final   Candida albicans NOT DETECTED NOT DETECTED Final   Candida auris NOT DETECTED NOT DETECTED Final   Candida glabrata NOT DETECTED NOT DETECTED Final   Candida krusei NOT DETECTED NOT DETECTED Final   Candida parapsilosis NOT DETECTED NOT DETECTED Final   Candida tropicalis NOT DETECTED NOT DETECTED Final   Cryptococcus neoformans/gattii NOT DETECTED NOT DETECTED Final   Meth resistant mecA/C and MREJ NOT DETECTED NOT DETECTED Final    Comment: Performed at [facility], [place] [address], [place], [place] [address]  Resp Panel by RT-PCR (Flu A&B, Covid) Nasopharyngeal Swab     Status: None   Collection Time: 03/08/21  3:00 PM   Specimen: Nasopharyngeal Swab; Nasopharyngeal(NP) swabs in vial transport medium  Result  Value Ref Range Status   SARS Coronavirus 2 by RT PCR NEGATIVE NEGATIVE Final    Comment: (NOTE) SARS-CoV-2 target nucleic acids are NOT DETECTED.  The SARS-CoV-2 RNA is generally detectable in upper respiratory specimens during the acute phase of infection. The lowest concentration of SARS-CoV-2 viral copies this assay can detect is 138 copies/mL. A negative result does not preclude SARS-Cov-2 infection and should not be used as the sole basis for treatment or other patient management decisions. A negative result may occur with  improper specimen collection/handling, submission of specimen other than nasopharyngeal swab, presence of viral mutation(s) within the areas targeted by this assay, and inadequate number of viral copies(<138 copies/mL). A negative result must be combined with clinical observations, patient history, and epidemiological information. The expected result is Negative.  Fact Sheet for Patients:  EntrepreneurPulse.com.au  Fact Sheet for Healthcare Providers:  IncredibleEmployment.be  This test is no t yet approved or cleared by the [place] FDA and  has been authorized for detection and/or diagnosis of SARS-CoV-2 by FDA under an Emergency Use Authorization (EUA). This EUA will remain  in effect (meaning this test can be used) for the duration of the COVID-19 declaration under Section 564(b)(1) of the Act, 21 U.S.C.section 360bbb-3(b)(1), unless the authorization is terminated  or revoked sooner.       Influenza A by PCR NEGATIVE NEGATIVE Final   Influenza B by PCR NEGATIVE NEGATIVE Final    Comment: (NOTE) The Xpert Xpress SARS-CoV-2/FLU/RSV plus assay is intended as an aid in the diagnosis of influenza from Nasopharyngeal swab specimens and should not be used as a sole basis for treatment. Nasal washings and aspirates are unacceptable for Xpert Xpress SARS-CoV-2/FLU/RSV testing.  Fact Sheet for  Patients: EntrepreneurPulse.com.au  Fact Sheet for Healthcare Providers: IncredibleEmployment.be  This test is not yet approved or cleared by the [place] FDA and has been authorized for detection and/or diagnosis of SARS-CoV-2 by FDA under an Emergency Use Authorization (EUA). This EUA will remain in effect (meaning this test can be used) for the duration of the COVID-19 declaration under Section 564(b)(1) of the Act, 21 U.S.C. section 360bbb-3(b)(1), unless the authorization is terminated or revoked.  Performed at [facility], [place] [address] [place], [place] [address]   Blood Culture (routine x 2)     Status: Abnormal   Collection Time: 03/08/21  3:00 PM   Specimen: BLOOD  Result Value Ref Range Status   Specimen Description   Final    BLOOD RIGHT ANTECUBITAL Performed at [facility] [address] [place], [place] [address]    Special Requests   Final  BOTTLES DRAWN AEROBIC AND ANAEROBIC Blood Culture adequate volume Performed at [facility] [address], [place], [place] [address]    Culture  Setup Time   Final    GRAM POSITIVE COCCI IN BOTH AEROBIC AND ANAEROBIC BOTTLES CRITICAL VALUE NOTED.  VALUE IS CONSISTENT WITH PREVIOUSLY REPORTED AND CALLED VALUE.    Culture (A)  Final    STAPHYLOCOCCUS AUREUS SUSCEPTIBILITIES PERFORMED ON PREVIOUS CULTURE WITHIN THE LAST 5 DAYS. Performed at [facility], [place] [address], [place], [place] [address]    Report Status 03/11/2021 FINAL  Final  Urine Culture     Status: None   Collection Time: 03/08/21  3:41 PM   Specimen: In/Out Cath Urine  Result Value Ref Range Status   Specimen Description   Final    IN/OUT CATH URINE Performed at [facility] [address], [place], [place] [address]    Special Requests   Final    NONE Performed at [facility], [place] [address],  [place], [place] [address]    Culture   Final    NO GROWTH Performed at [facility], [place] [address] [place], [place] [address]    Report Status 03/09/2021 FINAL  Final  Culture, blood (routine x 2)     Status: None   Collection Time: 03/10/21  3:48 AM   Specimen: BLOOD LEFT HAND  Result Value Ref Range Status   Specimen Description   Final    BLOOD LEFT HAND Performed at [facility] [address] [place], [place] [address]    Special Requests   Final    BOTTLES DRAWN AEROBIC AND ANAEROBIC Blood Culture adequate volume Performed at [facility] [address] [place], [place] [address]    Culture   Final    NO GROWTH 5 DAYS Performed at [facility], [place] [address], [place], [place] [address]    Report Status 03/15/2021 FINAL  Final  Culture, blood (routine x 2)     Status: None   Collection Time: 03/10/21  3:55 AM   Specimen: BLOOD LEFT FOREARM  Result Value Ref Range Status   Specimen Description   Final    BLOOD LEFT FOREARM Performed at [facility], [place] [address] [place], [place] [address]    Special Requests   Final    BOTTLES DRAWN AEROBIC ONLY Blood Culture results may not be optimal due to an inadequate volume of blood received in culture bottles Performed at [facility] [address] [place], [place] [address]    Culture   Final    NO GROWTH 5 DAYS Performed at [facility], [place] [address], [place], [place] [address]    Report Status 03/15/2021 FINAL  Final  Culture, blood (routine x 2)     Status: None   Collection Time: 03/11/21  3:52 AM   Specimen: BLOOD LEFT HAND  Result Value Ref Range Status   Specimen Description   Final    BLOOD LEFT HAND Performed at [facility] [address], [place],  [address]    Special Requests   Final    BOTTLES DRAWN AEROBIC ONLY Blood Culture adequate volume Performed at [facility] [address], [place],  [address]    Culture   Final    NO GROWTH 5 DAYS Performed at [facility], [place] [address] [place], [place]  [address]    Report Status 03/16/2021 FINAL  Final    Labs: CBC: Recent Labs  Lab 03/28/21 0339 04/01/21 0403  WBC 9.6 8.7  NEUTROABS 6.1  --   HGB 10.3* 10.6*  HCT 33.2* 32.7*  MCV 103.8* 100.9*  PLT 296 867   Basic Metabolic Panel: Recent Labs  Lab 03/28/21 0339 04/01/21 0403  NA 136 137  K 4.7 4.3  CL 101 101  CO2 23 25  GLUCOSE 95 114*  BUN 50* 49*  CREATININE 1.18* 1.21*  CALCIUM 9.4 10.0  MG 2.2 2.4   Liver Function Tests: No results for input(s): AST, ALT, ALKPHOS, BILITOT, PROT, ALBUMIN in the last 168 hours. CBG: Recent Labs  Lab 04/02/21 1732 04/02/21 1836 04/02/21 2127 04/03/21 0749 04/03/21 1141  GLUCAP 100* 158* 171* 117* 140*    Discharge time spent: greater than 30 minutes.  Signed: Oswald Hillock, MD [facility] 04/03/2021

## 2021-04-03 NOTE — TOC Transition Note (Signed)
Transition of Care [facility]) - CM/SW Discharge Note  Patient Details  Name: [name] [name] MRN: [id number] Date of Birth: 09-22-1945  [facility] CM/SW Contact:  Sherie Don, LCSW Phone Number: 04/03/2021, 1:13 PM  Clinical Narrative: CSW confirmed with Dyane Dustman with [facility] that patient can be admitted today. CSW completed insurance authorization. Reference ID # is: [id number]. Patient has been approved for 04/02/2021-04/06/2021. Discharge summary, discharge orders, and SNF transfer report faxed to facility in hub.  Medical necessity form done; PTAR scheduled. Discharge packet completed. CSW updated patient and daughter regarding discharge. RN updated. TOC signing off.  Final next level of care: Skilled Nursing Facility Barriers to Discharge: Barriers Resolved  Patient Goals and CMS Choice Patient states their goals for this hospitalization and ongoing recovery are:: to go home CMS Medicare.gov Compare Post Acute Care list provided to:: Patient  Discharge Placement PASRR number recieved: 03/16/21        Patient chooses bed at: Other - please specify in the comment section below: ([facility]) Patient to be transferred to facility by: [place] Name of family member notified: [name] (daughter) Patient and family notified of of transfer: 04/03/21  Discharge Plan and Services Discharge Planning Services: CM Consult       DME Arranged: N/A DME Agency: NA  Readmission Risk Interventions Readmission Risk Prevention Plan 04/02/2021  [place] or Home Care Consult Complete  Social Work Consult for [place] Planning/Counseling Complete  Palliative Care Screening Not Applicable  Some recent data might be hidden

## 2021-04-03 NOTE — Progress Notes (Signed)
Attempted to call report to [facility] twice. No response.

## 2021-04-03 NOTE — Progress Notes (Signed)
Chaplain provided listening as Aurielle shared the things that she is worried about currently.  She states that she is not sleeping well because she is worrying about things at home and thinking "bad thoughts".  Chaplain asked further about the "bad thoughts" and [name] [name] shared that she is thinking about what it would be like for her family if she were not there any more.  She cares for her family in significant ways and is the main caregiver to her granddaughter and is worried about how they would cope. Chaplain provided support as she shared her worries and encouraged her to speak with her provider about how her worries are keeping her awake at night.  [address] [place] Pager, [phone]

## 2021-04-03 NOTE — Plan of Care (Signed)
  Problem: Education: Goal: Knowledge of General Education information will improve Description Including pain rating scale, medication(s)/side effects and non-pharmacologic comfort measures Outcome: Progressing   

## 2021-04-03 NOTE — Progress Notes (Signed)
Chaplain engaged in a follow up visit with [name] [name].  She is anxious about moving to Rehab and about all of the transition.  She is still concerned about appointments and things happening at home.  Her son arrived to find out the location of the Rehab facility and to take any belongings she wanted him to take home.  They became frustrated with one another but both informed me that this is part of their family dynamic.  Chaplain provided support and listening to both.  [place], [place] Pager, [phone] 12:58 PM

## 2021-04-06 DIAGNOSIS — N1832 Chronic kidney disease, stage 3b: Secondary | ICD-10-CM | POA: Diagnosis not present

## 2021-04-06 DIAGNOSIS — I509 Heart failure, unspecified: Secondary | ICD-10-CM | POA: Diagnosis not present

## 2021-04-06 DIAGNOSIS — N179 Acute kidney failure, unspecified: Secondary | ICD-10-CM | POA: Diagnosis not present

## 2021-04-06 DIAGNOSIS — M4620 Osteomyelitis of vertebra, site unspecified: Secondary | ICD-10-CM | POA: Diagnosis not present

## 2021-04-09 DIAGNOSIS — E119 Type 2 diabetes mellitus without complications: Secondary | ICD-10-CM | POA: Diagnosis not present

## 2021-04-09 DIAGNOSIS — N1832 Chronic kidney disease, stage 3b: Secondary | ICD-10-CM | POA: Diagnosis not present

## 2021-04-09 DIAGNOSIS — M4620 Osteomyelitis of vertebra, site unspecified: Secondary | ICD-10-CM | POA: Diagnosis not present

## 2021-04-09 DIAGNOSIS — I509 Heart failure, unspecified: Secondary | ICD-10-CM | POA: Diagnosis not present

## 2021-04-13 DIAGNOSIS — I509 Heart failure, unspecified: Secondary | ICD-10-CM | POA: Diagnosis not present

## 2021-04-13 DIAGNOSIS — R4189 Other symptoms and signs involving cognitive functions and awareness: Secondary | ICD-10-CM | POA: Diagnosis not present

## 2021-04-13 DIAGNOSIS — M4620 Osteomyelitis of vertebra, site unspecified: Secondary | ICD-10-CM | POA: Diagnosis not present

## 2021-04-13 DIAGNOSIS — R202 Paresthesia of skin: Secondary | ICD-10-CM | POA: Diagnosis not present

## 2021-04-22 ENCOUNTER — Ambulatory Visit: Payer: Medicare Other | Admitting: Obstetrics & Gynecology

## 2021-04-23 ENCOUNTER — Inpatient Hospital Stay: Payer: Medicare Other | Admitting: Internal Medicine

## 2021-04-27 ENCOUNTER — Emergency Department (HOSPITAL_COMMUNITY)
Admission: EM | Admit: 2021-04-27 | Discharge: 2021-04-29 | Disposition: A | Discharge status: Skilled Nursing Facility | Payer: Medicare Other | Attending: Emergency Medicine | Admitting: Emergency Medicine

## 2021-04-27 ENCOUNTER — Encounter (HOSPITAL_COMMUNITY): Payer: Self-pay

## 2021-04-27 ENCOUNTER — Other Ambulatory Visit: Payer: Self-pay

## 2021-04-27 DIAGNOSIS — N189 Chronic kidney disease, unspecified: Secondary | ICD-10-CM | POA: Insufficient documentation

## 2021-04-27 DIAGNOSIS — Y848 Other medical procedures as the cause of abnormal reaction of the patient, or of later complication, without mention of misadventure at the time of the procedure: Secondary | ICD-10-CM | POA: Insufficient documentation

## 2021-04-27 DIAGNOSIS — Z79899 Other long term (current) drug therapy: Secondary | ICD-10-CM | POA: Insufficient documentation

## 2021-04-27 DIAGNOSIS — T82898A Other specified complication of vascular prosthetic devices, implants and grafts, initial encounter: Secondary | ICD-10-CM | POA: Diagnosis not present

## 2021-04-27 DIAGNOSIS — I5030 Unspecified diastolic (congestive) heart failure: Secondary | ICD-10-CM | POA: Diagnosis not present

## 2021-04-27 DIAGNOSIS — E785 Hyperlipidemia, unspecified: Secondary | ICD-10-CM | POA: Insufficient documentation

## 2021-04-27 DIAGNOSIS — T82524A Displacement of infusion catheter, initial encounter: Secondary | ICD-10-CM | POA: Diagnosis not present

## 2021-04-27 DIAGNOSIS — M4620 Osteomyelitis of vertebra, site unspecified: Secondary | ICD-10-CM | POA: Diagnosis not present

## 2021-04-27 DIAGNOSIS — Z79890 Hormone replacement therapy (postmenopausal): Secondary | ICD-10-CM | POA: Insufficient documentation

## 2021-04-27 DIAGNOSIS — E1122 Type 2 diabetes mellitus with diabetic chronic kidney disease: Secondary | ICD-10-CM | POA: Insufficient documentation

## 2021-04-27 LAB — COMPREHENSIVE METABOLIC PANEL
ALT: <5 U/L (ref 0–44)
AST: 15 U/L (ref 15–41)
Albumin: 3.2 g/dL — ABNORMAL LOW (ref 3.5–5.0)
Alkaline Phosphatase: 81 U/L (ref 38–126)
Anion gap: 8 (ref 5–15)
BUN: 24 mg/dL — ABNORMAL HIGH (ref 8–23)
CO2: 27 mmol/L (ref 22–32)
Calcium: 9.3 mg/dL (ref 8.9–10.3)
Chloride: 104 mmol/L (ref 98–111)
Creatinine, Ser: 1.19 mg/dL — ABNORMAL HIGH (ref 0.44–1.00)
GFR, Estimated: 47 mL/min — ABNORMAL LOW (ref >60)
Glucose, Bld: 127 mg/dL — ABNORMAL HIGH (ref 70–99)
Potassium: 4 mmol/L (ref 3.5–5.1)
Sodium: 139 mmol/L (ref 135–145)
Total Bilirubin: 0.2 mg/dL — ABNORMAL LOW (ref 0.3–1.2)
Total Protein: 7.3 g/dL (ref 6.5–8.1)

## 2021-04-27 LAB — CBC WITH DIFFERENTIAL/PLATELET
Abs Immature Granulocytes: 0.22 10*3/uL — ABNORMAL HIGH (ref 0.00–0.07)
Basophils Absolute: 0.1 10*3/uL (ref 0.0–0.1)
Basophils Relative: 1 %
Eosinophils Absolute: 0.1 10*3/uL (ref 0.0–0.5)
Eosinophils Relative: 1 %
HCT: 33.2 % — ABNORMAL LOW (ref 36.0–46.0)
Hemoglobin: 10.6 g/dL — ABNORMAL LOW (ref 12.0–15.0)
Immature Granulocytes: 2 %
Lymphocytes Relative: 11 %
Lymphs Abs: 1.3 10*3/uL (ref 0.7–4.0)
MCH: 32.6 pg (ref 26.0–34.0)
MCHC: 31.9 g/dL (ref 30.0–36.0)
MCV: 102.2 fL — ABNORMAL HIGH (ref 80.0–100.0)
Monocytes Absolute: 1 10*3/uL (ref 0.1–1.0)
Monocytes Relative: 8 %
Neutro Abs: 9 10*3/uL — ABNORMAL HIGH (ref 1.7–7.7)
Neutrophils Relative %: 77 %
Platelets: 263 10*3/uL (ref 150–400)
RBC: 3.25 MIL/uL — ABNORMAL LOW (ref 3.87–5.11)
RDW: 14.6 % (ref 11.5–15.5)
WBC: 11.7 10*3/uL — ABNORMAL HIGH (ref 4.0–10.5)
nRBC: 0 % (ref 0.0–0.2)

## 2021-04-27 LAB — LACTIC ACID, PLASMA: Lactic Acid, Venous: 1.3 mmol/L (ref 0.5–1.9)

## 2021-04-27 NOTE — ED Triage Notes (Signed)
Patient BIB EMS from [facility]. Pt had PICC line placed due to MRSA infection. Pt pulled out PICC line x 2 days ago and has not been getting her antibiotics since. Taking Cefazolin IV 3 x/day. Hx breast cancer, restricted right extremity  ? ?EMS vitals: ?BP 130 palpated ?HR 110 ?SPO2 97% RA ?RR 16 ?CBG 169  ?

## 2021-04-28 ENCOUNTER — Emergency Department: Payer: Self-pay

## 2021-04-28 DIAGNOSIS — T82524A Displacement of infusion catheter, initial encounter: Secondary | ICD-10-CM | POA: Diagnosis not present

## 2021-04-28 MED ORDER — CHLORHEXIDINE GLUCONATE CLOTH 2 % EX PADS: 6.0000 | MEDICATED_PAD | Freq: Every day | CUTANEOUS | Status: DC

## 2021-04-28 MED ORDER — SODIUM CHLORIDE 0.9% FLUSH: 10.0000 mL | INTRAVENOUS | Status: DC | PRN

## 2021-04-28 MED ORDER — SODIUM CHLORIDE 0.9 % IV SOLN: INTRAVENOUS | Status: DC | PRN

## 2021-04-28 MED ORDER — CEFAZOLIN SODIUM-DEXTROSE 2-4 GM/100ML-% IV SOLN
2.0000 g | Freq: Once | INTRAVENOUS | Status: DC
  Filled 2021-04-28: qty 100

## 2021-04-28 MED ORDER — SODIUM CHLORIDE 0.9% FLUSH: 10.0000 mL | Freq: Two times a day (BID) | INTRAVENOUS | Status: DC

## 2021-04-28 MED ORDER — CEFAZOLIN SODIUM-DEXTROSE 2-4 GM/100ML-% IV SOLN
2.0000 g | Freq: Once | INTRAVENOUS | Status: AC
Start: 2021-04-28 — End: 2021-04-28
  Administered 2021-04-28: 2 g via INTRAVENOUS
  Filled 2021-04-28: qty 100

## 2021-04-28 NOTE — ED Notes (Addendum)
Spoke to Pt's daughter.  Daughter is concerned that the Pt is not being cared for appropriately by facility.  Sts was A & Ox4 when she was previously discharged from hospital.  Pt missed her most recent appointment w/ Infectious Disease.  Additionally, her daughter is worried about the Pt's mental health and feels she would benefit from a psych consult.           ?

## 2021-04-28 NOTE — ED Provider Notes (Signed)
?[place] DEPT ?Provider Note ? ? ?CSN: [id number] ?Arrival date & time: 04/27/21  2048 ? ?  ? ?History ? ?Chief Complaint  ?Patient presents with  ? Vascular Access Problem  ? ? ?[name] [name] is a 76 y.o. female. ? ?The history is provided by the patient and the nursing home.  ?She has history of hyperlipidemia, diastolic heart failure, diabetes, chronic kidney disease, vertebral osteomyelitis and is currently getting intravenous cefazolin via PICC line.  She is reported to have pulled out the PICC line 2 days ago and has not been getting her antibiotics during that time.  However, I did call the nursing facility and states that she did receive a dose of antibiotics 2 PM today.  I am unable to resolve the discrepancy history at this point.  However, patient is not able to give any significant history. ?  ?Home Medications ?Prior to Admission medications   ?Medication Sig Start Date End Date Taking? Authorizing Provider  ?acetaminophen (TYLENOL) 325 MG tablet Take 2 tablets (650 mg total) by mouth every 6 (six) hours as needed for mild pain (or Fever >/= 101). 04/03/21   Oswald Hillock, MD  ?anastrozole (ARIMIDEX) 1 MG tablet Take 1 tablet (1 mg total) by mouth daily. 08/06/20   Magrinat, Virgie Dad, MD  ?carbamazepine (TEGRETOL XR) 200 MG 12 hr tablet Take 200 mg by mouth at bedtime.    [provider]  ?ceFAZolin (ANCEF) IVPB Inject 2 g into the vein every 8 (eight) hours. Indication:  MSSA Vertebral Osteomyelitis ?First Dose: Yes ?Last Day of Therapy:  March 21st, 2023 ?Labs - Once weekly:  CBC/D and BMP, ?Labs - Every other week:  ESR and CRP 04/03/21 05/07/21  Oswald Hillock, MD  ?Coenzyme Q10 (CO Q-10) 300 MG CAPS Take 300 mg by mouth at bedtime.    [provider]  ?Cyanocobalamin (B-12) 5000 MCG SUBL Place 1 tablet under the tongue every morning.    [provider]  ?feeding supplement, GLUCERNA SHAKE, (GLUCERNA SHAKE) LIQD Take 237 mLs by mouth 3 (three)  times daily between meals. 04/03/21   Oswald Hillock, MD  ?furosemide (LASIX) 20 MG tablet Take 1 tablet (20 mg total) by mouth daily. 04/04/21   Oswald Hillock, MD  ?hydrocortisone (ANUSOL-HC) 2.5 % rectal cream Place 1 application rectally 2 (two) times daily. ?Patient not taking: Reported on 03/08/2021 12/02/20   Levin Erp, PA  ?lactulose (CHRONULAC) 10 GM/15ML solution Take 15 mLs (10 g total) by mouth 2 (two) times daily as needed for mild constipation. 04/03/21   Oswald Hillock, MD  ?levothyroxine (SYNTHROID) 88 MCG tablet Take 88 mcg by mouth daily before breakfast.    [provider]  ?Magnesium 400 MG TABS Take 400 mg by mouth daily.    [provider]  ?Omega-3 Fatty Acids (FISH OIL) 1200 MG CAPS Take 1,200 mg by mouth daily at 6 (six) AM.    [provider]  ?oxyCODONE (OXY IR/ROXICODONE) 5 MG immediate release tablet Take 1 tablet (5 mg total) by mouth every 6 (six) hours as needed for severe pain. 04/03/21   Oswald Hillock, MD  ?rosuvastatin (CRESTOR) 5 MG tablet Take 1 tablet (5 mg total) by mouth daily. 01/01/21   Chandrasekhar, Terisa Starr, MD  ?senna-docusate (SENOKOT-S) 8.6-50 MG tablet Take 1 tablet by mouth 2 (two) times daily. 04/03/21   Oswald Hillock, MD  ?triamcinolone cream (KENALOG) 0.1 % Apply 1 application topically 2 (two) times  daily. 02/22/21   [provider]  ?   ? ?Allergies    ?Patient has no known allergies.   ? ?Review of Systems   ?Review of Systems  ?All other systems reviewed and are negative. ? ?Physical Exam ?Updated Vital Signs ?BP 124/72   Pulse (!) 110   Temp 98.6 ?F (37 ?C) (Oral)   Resp 16   SpO2 96%  ?Physical Exam ?Vitals and nursing note reviewed.  ?76 year old female, resting comfortably and in no acute distress. Vital signs are significant for slightly elevated heart rate. Oxygen saturation is 96%, which is normal. ?Head is normocephalic and atraumatic. PERRLA, EOMI. Oropharynx is clear. ?Neck is nontender and supple without  adenopathy or JVD. ?Back is nontender and there is no CVA tenderness. ?Lungs are clear without rales, wheezes, or rhonchi. ?Chest is nontender. ?Heart has regular rate and rhythm without murmur. ?Abdomen is soft, flat, nontender without masses or hepatosplenomegaly and peristalsis is normoactive. ?Extremities have 2+ edema, full range of motion is present. ?Skin is warm and dry without rash. ?Neurologic: Awake and alert, moves all extremities equally, cranial nerves are intact. ? ?ED Results / Procedures / Treatments   ?Labs ?(all labs ordered are listed, but only abnormal results are displayed) ?Labs Reviewed  ?CBC WITH DIFFERENTIAL/PLATELET - Abnormal; Notable for the following components:  ?    Result Value  ? WBC 11.7 (*)   ? RBC 3.25 (*)   ? Hemoglobin 10.6 (*)   ? HCT 33.2 (*)   ? MCV 102.2 (*)   ? Neutro Abs 9.0 (*)   ? Abs Immature Granulocytes 0.22 (*)   ? All other components within normal limits  ?COMPREHENSIVE METABOLIC PANEL - Abnormal; Notable for the following components:  ? Glucose, Bld 127 (*)   ? BUN 24 (*)   ? Creatinine, Ser 1.19 (*)   ? Albumin 3.2 (*)   ? Total Bilirubin 0.2 (*)   ? GFR, Estimated 47 (*)   ? All other components within normal limits  ?CULTURE, BLOOD (ROUTINE X 2)  ?CULTURE, BLOOD (ROUTINE X 2)  ?LACTIC ACID, PLASMA  ?LACTIC ACID, PLASMA  ? ?Procedures ?Procedures  ? ? ?Medications Ordered in ED ?Medications - No data to display ? ?ED Course/ Medical Decision Making/ A&P ?  ?                        ?Medical Decision Making ?Risk ?Prescription drug management. ? ? ?Vertebral osteomyelitis currently getting IV antibiotics at skilled nursing facility, but PICC line pulled out.  I have called the nursing facility and they state that she did receive a dose of antibiotics at 2 PM today.  She would have been due for antibiotics at 10 PM.  She is given a dose of cefazolin and will arrange for PICC line replacement.  Old records reviewed confirming hospitalization in January with  diagnosis of vertebral osteomyelitis from methicillin sensitive Staphylococcus aureus, and requirement for IV cefazolin through March 21.  In the meantime, she will be given her scheduled doses of cefazolin.  Case is signed out to Dr. Zenia Resides, oncoming physician. ? ? ? ? ? ? ? ?Final Clinical Impression(s) / ED Diagnoses ?Final diagnoses:  ?Vertebral osteomyelitis ([place])  ? ? ?Rx / DC Orders ?ED Discharge Orders   ? ? None  ? ?  ? ? ?  ?Delora Fuel, MD ?14/97/02 [phone] ? ?

## 2021-04-28 NOTE — ED Notes (Signed)
Per IR, IV team can place this Pt's PICC line.  IV team consult will be placed.  ?

## 2021-04-28 NOTE — ED Notes (Signed)
PICC RN at bedside

## 2021-04-28 NOTE — ED Notes (Signed)
Pt was changed and has a new brief on. Pt was updated on her care and was given juice and crackers.  ?

## 2021-04-28 NOTE — ED Provider Notes (Signed)
PICC placed. ? ?Patient  appropriate for discharge.  ?  ?Valarie Merino, MD ?04/28/21 1653 ? ?

## 2021-04-28 NOTE — Discharge Instructions (Signed)
Continue antibiotics as prescribed

## 2021-04-28 NOTE — ED Provider Notes (Signed)
?  3:16 PM ? ?Patient signed to me by Dr. Roxanne Mins pending placement of PICC line.  Prolonged patient is currently not receiving her PICC line after a delay.  Will be discharged afterwards ?  ?[name] Leigh, MD ?04/28/21 1516 ? ?

## 2021-04-28 NOTE — ED Notes (Signed)
Pt continues to voice frustrations concerning delay in PICC line placement.  ?

## 2021-04-28 NOTE — ED Notes (Signed)
Patient resting quietly in hallway.  Currently waiting for [facility] for transportation back to facility  ?

## 2021-04-28 NOTE — ED Notes (Signed)
Report received at this time.  Patient currently resting quietly.  Waiting for [facility] transportation ?

## 2021-04-28 NOTE — ED Notes (Addendum)
This Probation officer, again, explained to the Pt that she is waiting on transport.  Pt visibly frustrated.  When this writer asked what she is frustrated about, Pt reports that she isn't ready to leave and needs to be wheeled to the lobby.  Pt informed transport will pick her up from the room.  Additionally, Pt reports she came in a diaper.  Pt reminded she currently has a clean diaper on.       ?

## 2021-04-28 NOTE — ED Notes (Signed)
[facility] called to transport Pt back to [facility].  ?

## 2021-04-28 NOTE — ED Notes (Signed)
ED Provider at bedside. 

## 2021-04-28 NOTE — ED Notes (Signed)
PICC RN remains at bedside.  ?

## 2021-04-28 NOTE — ED Notes (Signed)
Pt and Pt's son given an update.  Pt is very confused about what it going on and hard to reorient.  ?

## 2021-04-28 NOTE — ED Notes (Signed)
Pt is getting increasingly agitated and is hard to redirect.  Pt is convinced she has missed her transport.  This Probation officer has tried to reassure her.  Pt is also convinced she has had a BM.  No stool noted in brief.  ?

## 2021-04-28 NOTE — ED Notes (Signed)
Per IV team RN, PICC RN is at another location placing a line.  No ETA can be given.  ?

## 2021-04-28 NOTE — Progress Notes (Signed)
Peripherally Inserted Central Catheter Placement ? ?The IV Nurse has discussed with the patient and/or persons authorized to consent for the patient, the purpose of this procedure and the potential benefits and risks involved with this procedure.  The benefits include less needle sticks, lab draws from the catheter, and the patient may be discharged home with the catheter. Risks include, but not limited to, infection, bleeding, blood clot (thrombus formation), and puncture of an artery; nerve damage and irregular heartbeat and possibility to perform a PICC exchange if needed/ordered by physician.  Alternatives to this procedure were also discussed.  Bard Power PICC patient education guide, fact sheet on infection prevention and patient information card has been provided to patient /or left at bedside.   ? ?PICC Placement Documentation  ?PICC Single Lumen 23/55/73 Left Cephalic 40 cm 0 cm (Active)  ?Indication for Insertion or Continuance of Line Prolonged intravenous therapies 04/28/21 1600  ?Exposed Catheter (cm) 0 cm 04/28/21 1600  ?Site Assessment Clean, Dry, Intact 04/28/21 1600  ?Line Status Flushed;Saline locked;Blood return noted 04/28/21 1600  ?Dressing Type Transparent;Securing device 04/28/21 1600  ?Dressing Status Antimicrobial disc in place;Clean, Dry, Intact 04/28/21 1600  ?Safety Lock Not Applicable 22/02/54 2706  ?Dressing Intervention New dressing;Other (Comment) 04/28/21 1600  ?Dressing Change Due 05/05/21 04/28/21 1600  ? ? ?Patient's daughter, [name], signed consent via telephone. Verified by 2 IV/PICC RNs. LUA used for PICC placement due to RUE restricted/hx of R breast cancer. ? ? ?Enos Fling ?04/28/2021, 4:30 PM ? ?

## 2021-04-28 NOTE — ED Notes (Signed)
Pt was seen biting her hand due to increase agitation ?

## 2021-04-29 DIAGNOSIS — Z743 Need for continuous supervision: Secondary | ICD-10-CM | POA: Diagnosis not present

## 2021-04-29 DIAGNOSIS — M6281 Muscle weakness (generalized): Secondary | ICD-10-CM | POA: Diagnosis not present

## 2021-04-29 DIAGNOSIS — A4101 Sepsis due to Methicillin susceptible Staphylococcus aureus: Secondary | ICD-10-CM | POA: Diagnosis not present

## 2021-04-29 DIAGNOSIS — R000 Tachycardia, unspecified: Secondary | ICD-10-CM | POA: Diagnosis not present

## 2021-04-29 DIAGNOSIS — R9431 Abnormal electrocardiogram [ECG] [EKG]: Secondary | ICD-10-CM | POA: Diagnosis not present

## 2021-04-29 DIAGNOSIS — T82898A Other specified complication of vascular prosthetic devices, implants and grafts, initial encounter: Secondary | ICD-10-CM | POA: Diagnosis not present

## 2021-04-29 DIAGNOSIS — E1122 Type 2 diabetes mellitus with diabetic chronic kidney disease: Secondary | ICD-10-CM | POA: Diagnosis not present

## 2021-04-29 DIAGNOSIS — R7989 Other specified abnormal findings of blood chemistry: Secondary | ICD-10-CM | POA: Diagnosis not present

## 2021-04-29 DIAGNOSIS — D72829 Elevated white blood cell count, unspecified: Secondary | ICD-10-CM | POA: Diagnosis not present

## 2021-04-29 DIAGNOSIS — R279 Unspecified lack of coordination: Secondary | ICD-10-CM | POA: Diagnosis not present

## 2021-04-29 DIAGNOSIS — E118 Type 2 diabetes mellitus with unspecified complications: Secondary | ICD-10-CM | POA: Diagnosis not present

## 2021-04-29 DIAGNOSIS — H109 Unspecified conjunctivitis: Secondary | ICD-10-CM | POA: Diagnosis not present

## 2021-04-29 DIAGNOSIS — R3989 Other symptoms and signs involving the genitourinary system: Secondary | ICD-10-CM | POA: Diagnosis not present

## 2021-04-29 DIAGNOSIS — N189 Chronic kidney disease, unspecified: Secondary | ICD-10-CM | POA: Diagnosis not present

## 2021-04-29 DIAGNOSIS — N1832 Chronic kidney disease, stage 3b: Secondary | ICD-10-CM | POA: Diagnosis not present

## 2021-04-29 DIAGNOSIS — R339 Retention of urine, unspecified: Secondary | ICD-10-CM | POA: Diagnosis not present

## 2021-04-29 DIAGNOSIS — I509 Heart failure, unspecified: Secondary | ICD-10-CM | POA: Diagnosis not present

## 2021-04-29 DIAGNOSIS — R262 Difficulty in walking, not elsewhere classified: Secondary | ICD-10-CM | POA: Diagnosis not present

## 2021-04-29 DIAGNOSIS — R300 Dysuria: Secondary | ICD-10-CM | POA: Diagnosis present

## 2021-04-29 DIAGNOSIS — R4182 Altered mental status, unspecified: Secondary | ICD-10-CM | POA: Diagnosis not present

## 2021-04-29 DIAGNOSIS — R2689 Other abnormalities of gait and mobility: Secondary | ICD-10-CM | POA: Diagnosis not present

## 2021-04-29 DIAGNOSIS — N390 Urinary tract infection, site not specified: Secondary | ICD-10-CM | POA: Diagnosis not present

## 2021-04-29 DIAGNOSIS — Z853 Personal history of malignant neoplasm of breast: Secondary | ICD-10-CM | POA: Diagnosis not present

## 2021-04-29 DIAGNOSIS — E119 Type 2 diabetes mellitus without complications: Secondary | ICD-10-CM | POA: Diagnosis not present

## 2021-04-29 DIAGNOSIS — D649 Anemia, unspecified: Secondary | ICD-10-CM | POA: Diagnosis not present

## 2021-04-29 DIAGNOSIS — I5032 Chronic diastolic (congestive) heart failure: Secondary | ICD-10-CM | POA: Diagnosis not present

## 2021-04-29 DIAGNOSIS — M4620 Osteomyelitis of vertebra, site unspecified: Secondary | ICD-10-CM | POA: Diagnosis not present

## 2021-04-29 DIAGNOSIS — E039 Hypothyroidism, unspecified: Secondary | ICD-10-CM | POA: Diagnosis not present

## 2021-04-29 NOTE — ED Notes (Signed)
[facility] at bedside.  Provided with discharge instructions and Facesheet.  All personal belongings transported back to facility with patient  ?

## 2021-04-29 NOTE — ED Notes (Signed)
[facility] called and made aware that patient is currently being picked up by [facility] and will return shortly ?

## 2021-04-29 NOTE — ED Notes (Signed)
Patient c/o "being wet."  Patient moved into room for privacy.  Incontinent of urine.  Patient cleaned with warm soap and water.  New diaper brief placed.  Repositioned in bed for comfort.  Patient updated on plan of care.  Currently waiting for transportation  ?

## 2021-05-02 LAB — CULTURE, BLOOD (ROUTINE X 2)
Culture: NO GROWTH
Special Requests: ADEQUATE

## 2021-05-05 NOTE — Progress Notes (Deleted)
?Cardiology Office Note:   ? ?Date:  05/05/2021  ? ?ID:  [name] [name], DOB October 28, 1945, MRN [id number] ? ?PCP:  Caprice Renshaw, MD ?  ?[facility] Providers ?Cardiologist:  Werner Lean, MD    ? ?Referring MD: Janie Morning, DO  ? ?CC: Mitral regurgitation. ? ?History of Present Illness:   ? ?[name] [name] is a 76 y.o. female with a hx of HFpEF CKD Stage IIIa,  Breast cancer s/p 30 Gy radiation on no cardiotoxic chemotherapy.   At least evaluation started diuretic being mindful of potassium.  In 2023 had Bacteremia with planned antibiotics still 2023.  Had PICC placement.  No evidence of significant MR or infective endocarditis.  Her diuretic was reduced to 20 mg PO daily in conjunction with being euvolemic and with hyponatremia that resolved.   ? ?Patient notes that she is doing ***.   ? ?No chest pain or pressure ***.  No SOB/DOE*** and no PND/Orthopnea***.  No weight gain or leg swelling***.  No palpitations or syncope ***. ? ?Ambulatory blood pressure ***. ? ? ? ?Past Medical History:  ?Diagnosis Date  ? Anxiety   ? being weaned off lithium will be off on 06/23/11  ? Bipolar disorder ([place])   ? Cancer ([place]) 07/2019  ? right breast IDC with DCIS  ? Diastolic heart failure ([place])   ? Diverticulosis 09/01/2011  ? mild, left sided  ? Family history of breast cancer   ? Family history of colon cancer   ? Hyperlipidemia   ? Hypothyroidism   ? IBS (irritable bowel syndrome)   ? Leg cramps   ? Mood disorder ([place])   ? Morbidly obese ([place])   ? Numbness and tingling in left arm   ? Osteopenia   ? Prediabetes   ? Stage 3 chronic kidney disease ([place])   ? CKD stg 3  ? Vitamin D deficiency   ? ? ?Past Surgical History:  ?Procedure Laterality Date  ? back biopsy    ? for skin  ? BREAST LUMPECTOMY WITH RADIOACTIVE SEED AND SENTINEL LYMPH NODE BIOPSY Right 08/29/2019  ? Procedure: RIGHT BREAST LUMPECTOMY WITH RADIOACTIVE SEED AND SENTINEL LYMPH NODE BIOPSY;  Surgeon: Erroll Luna, MD;  Location: [facility];  Service: General;  Laterality: Right;  ? COLONOSCOPY  06/09/2006  ? Dr.Orr  ? COLONOSCOPY  09/01/2011  ? DILATATION & CURETTAGE/HYSTEROSCOPY WITH TRUECLEAR N/A 11/12/2013  ? Procedure: RESECTOSCOPIC POLYPECTOMY Irish Lack, D&C;  Surgeon: Terrance Mass, MD;  Location: [facility];  Service: Gynecology;  Laterality: N/A;  ? ENDOMETRIAL BIOPSY  09/14/2013  ? Dr. Uvaldo Rising  ? RADIOLOGY WITH ANESTHESIA N/A 03/14/2021  ? Procedure: MRI THORASIC AND LUMBAR WITH AND WITHOUT CONTRAST WITH ANESTHESIA;  Surgeon: Radiologist, Medication, MD;  Location: [facility];  Service: Radiology;  Laterality: N/A;  ? ? ?Current Medications: ?No outpatient medications have been marked as taking for the 05/08/21 encounter (Appointment) with Werner Lean, MD.  ?  ? ?Allergies:   Patient has no known allergies.  ? ?Social History  ? ?Socioeconomic History  ? Marital status: Married  ?  Spouse name: Daphene Jaeger Bisig  ? Number of children: 2  ? Years of education: 71  ? Highest education level: High school graduate  ?Occupational History  ? Occupation: Retired  ?Tobacco Use  ? Smoking status: Never  ? Smokeless tobacco: Never  ?Vaping Use  ? Vaping Use: Never used  ?Substance and Sexual Activity  ? Alcohol use: No  ?  Alcohol/week: 0.0 standard drinks  ? Drug use: No  ? Sexual activity: Not Currently  ?  Birth control/protection: Post-menopausal  ?Other Topics Concern  ? Not on file  ?Social History Narrative  ? Married, Retired  ? 1 son - Ammarie Matsuura, works in [employer]   ? 1 daughter - [name], works as a Hospital doctor  ? ?Social Determinants of Health  ? ?Financial Resource Strain: Not on file  ?Food Insecurity: Not on file  ?Transportation Needs: Not on file  ?Physical Activity: Not on file  ?Stress: Not on file  ?Social Connections: Not on file  ?  ?Social: son has liver disease, granddaughter is living at home, husband has dementia early onset ? ?Family History: ?The patient's  family history includes Alzheimer's disease in her maternal grandfather; Breast cancer (age of onset: 12) in her cousin; Cancer - Colon (age of onset: 63) in her father; Colon cancer (age of onset: 61) in her father; Diabetes in her mother; Other in her daughter and mother; Parkinson's disease (age of onset: 65) in her sister. There is no history of Ovarian cancer or Colon polyps. ? ?ROS:   ?Please see the history of present illness.    ?Hips hurt. ?Peripheral neuropathy in her feet. ? All other systems reviewed and are negative. ? ?EKGs/Labs/Other Studies Reviewed:   ? ?The following studies were reviewed today: ? ? ?EKG:  EKG is  ordered today.  The ekg ordered today demonstrates  ?01/01/21: SR rate 99 ? ?Transthoracic Echocardiogram: ?Date: 12/08/2017 ?Results: ?- Left ventricle: The cavity size was mildly dilated. Systolic  ?  function was vigorous. The estimated ejection fraction was in the  ?  range of 65% to 70%. Wall motion was normal; there were no  ?  regional wall motion abnormalities. There was an increased  ?  relative contribution of atrial contraction to ventricular  ?  filling. Doppler parameters are consistent with abnormal left  ?  ventricular relaxation (grade 1 diastolic dysfunction).  ?- Aortic valve: Trileaflet; normal thickness, mildly calcified  ?  leaflets.  ?- Mitral valve: There was mild regurgitation directed eccentrically  ?  and anteriorly.  ?- Atrial septum: There was increased thickness of the septum,  ?  consistent with lipomatous hypertrophy.  ?- Tricuspid valve: There was trivial regurgitation.  ? ?Date 01/01/21 ?Moderate MD and Grade 1 Diastolic dysfunction EF 84%. ? ? ?Recent Labs: ?03/08/2021: TSH 0.429 ?03/17/2021: B Natriuretic Peptide 98.5 ?04/01/2021: Magnesium 2.4 ?04/27/2021: ALT <5; BUN 24; Creatinine, Ser 1.19; Hemoglobin 10.6; Platelets 263; Potassium 4.0; Sodium 139  ?Recent Lipid Panel ?No results found for: CHOL, TRIG, HDL, CHOLHDL, VLDL, LDLCALC, LDLDIRECT ? ? ?Physical  Exam:   ? ?VS:  There were no vitals taken for this visit.   ? ?Wt Readings from Last 3 Encounters:  ?03/08/21 188 lb 7.9 oz (85.5 kg)  ?01/01/21 191 lb (86.6 kg)  ?12/08/20 187 lb (84.8 kg)  ?  ?Gen: No distress, morbid obesity ?Neck: No JVD ?Cardiac: No Rubs or Gallops, holosystolic Murmur, normal rhythm, +2 radial pulses ?Respiratory: Clear to auscultation bilaterally, normal effort, normal  respiratory rate ?GI: Soft, nontender, non-distended  ?MS: +1 edema bilaterally;  moves all extremities ?Integument: Skin feels warm ?Neuro:  At time of evaluation, alert and oriented to person/place/time/situation  ?Psych: Nervous affect, patient feels nervous ? ?ASSESSMENT:   ? ?No diagnosis found. ? ?PLAN:   ? ?HFpEF and HTN ?- NYHA class ***, Stage ***, ***volemic, etiology from *** ?CKD Stage  IIIa ?Mitral regurgitation ?NAFLD ?Breast cancer with radiation increasing risk for CAD ?Morbid Obesity ?- LDL *** on *** statin ?- presently euvolemic on *** ? ? ?Will plan for *** month follow up with ***APP or ***me/primary cardiologist unless new symptoms or abnormal test results warranting change in plan ? ? ? ? ?Medication Adjustments/Labs and Tests Ordered: ?Current medicines are reviewed at length with the patient today.  Concerns regarding medicines are outlined above.  ?No orders of the defined types were placed in this encounter. ? ? ?No orders of the defined types were placed in this encounter. ? ? ? ?There are no Patient Instructions on file for this visit.  ? ?Signed, ?Werner Lean, MD  ?05/05/2021 4:33 PM    ?[place] ? ?

## 2021-05-06 ENCOUNTER — Ambulatory Visit: Payer: Medicare Other | Admitting: Hematology and Oncology

## 2021-05-06 ENCOUNTER — Other Ambulatory Visit: Payer: Medicare Other

## 2021-05-06 DIAGNOSIS — E119 Type 2 diabetes mellitus without complications: Secondary | ICD-10-CM | POA: Diagnosis not present

## 2021-05-06 DIAGNOSIS — I509 Heart failure, unspecified: Secondary | ICD-10-CM | POA: Diagnosis not present

## 2021-05-06 DIAGNOSIS — M4620 Osteomyelitis of vertebra, site unspecified: Secondary | ICD-10-CM | POA: Diagnosis not present

## 2021-05-06 DIAGNOSIS — N1832 Chronic kidney disease, stage 3b: Secondary | ICD-10-CM | POA: Diagnosis not present

## 2021-05-08 ENCOUNTER — Encounter (HOSPITAL_COMMUNITY): Payer: Self-pay

## 2021-05-08 ENCOUNTER — Other Ambulatory Visit: Payer: Self-pay

## 2021-05-08 ENCOUNTER — Ambulatory Visit: Payer: Medicare Other | Admitting: Internal Medicine

## 2021-05-08 ENCOUNTER — Emergency Department (HOSPITAL_COMMUNITY)
Admission: EM | Admit: 2021-05-08 | Discharge: 2021-05-08 | Disposition: A | Discharge status: Home / Self Care | Payer: Medicare Other | Attending: Emergency Medicine | Admitting: Emergency Medicine

## 2021-05-08 DIAGNOSIS — R339 Retention of urine, unspecified: Secondary | ICD-10-CM

## 2021-05-08 DIAGNOSIS — R000 Tachycardia, unspecified: Secondary | ICD-10-CM | POA: Diagnosis not present

## 2021-05-08 DIAGNOSIS — N390 Urinary tract infection, site not specified: Secondary | ICD-10-CM | POA: Diagnosis not present

## 2021-05-08 DIAGNOSIS — E1122 Type 2 diabetes mellitus with diabetic chronic kidney disease: Secondary | ICD-10-CM | POA: Insufficient documentation

## 2021-05-08 DIAGNOSIS — Z853 Personal history of malignant neoplasm of breast: Secondary | ICD-10-CM | POA: Diagnosis not present

## 2021-05-08 DIAGNOSIS — D72829 Elevated white blood cell count, unspecified: Secondary | ICD-10-CM | POA: Diagnosis not present

## 2021-05-08 DIAGNOSIS — I509 Heart failure, unspecified: Secondary | ICD-10-CM | POA: Insufficient documentation

## 2021-05-08 DIAGNOSIS — R7989 Other specified abnormal findings of blood chemistry: Secondary | ICD-10-CM | POA: Insufficient documentation

## 2021-05-08 DIAGNOSIS — N189 Chronic kidney disease, unspecified: Secondary | ICD-10-CM | POA: Insufficient documentation

## 2021-05-08 DIAGNOSIS — D649 Anemia, unspecified: Secondary | ICD-10-CM | POA: Insufficient documentation

## 2021-05-08 DIAGNOSIS — R300 Dysuria: Secondary | ICD-10-CM | POA: Diagnosis present

## 2021-05-08 LAB — CBC WITH DIFFERENTIAL/PLATELET
Abs Immature Granulocytes: 0.2 10*3/uL — ABNORMAL HIGH (ref 0.00–0.07)
Basophils Absolute: 0.1 10*3/uL (ref 0.0–0.1)
Basophils Relative: 0 %
Eosinophils Absolute: 0.1 10*3/uL (ref 0.0–0.5)
Eosinophils Relative: 1 %
HCT: 29.5 % — ABNORMAL LOW (ref 36.0–46.0)
Hemoglobin: 9.7 g/dL — ABNORMAL LOW (ref 12.0–15.0)
Immature Granulocytes: 1 %
Lymphocytes Relative: 8 %
Lymphs Abs: 1.1 10*3/uL (ref 0.7–4.0)
MCH: 33.4 pg (ref 26.0–34.0)
MCHC: 32.9 g/dL (ref 30.0–36.0)
MCV: 101.7 fL — ABNORMAL HIGH (ref 80.0–100.0)
Monocytes Absolute: 0.9 10*3/uL (ref 0.1–1.0)
Monocytes Relative: 7 %
Neutro Abs: 11.7 10*3/uL — ABNORMAL HIGH (ref 1.7–7.7)
Neutrophils Relative %: 83 %
Platelets: 278 10*3/uL (ref 150–400)
RBC: 2.9 MIL/uL — ABNORMAL LOW (ref 3.87–5.11)
RDW: 14.1 % (ref 11.5–15.5)
WBC: 14.1 10*3/uL — ABNORMAL HIGH (ref 4.0–10.5)
nRBC: 0 % (ref 0.0–0.2)

## 2021-05-08 LAB — COMPREHENSIVE METABOLIC PANEL WITH GFR
ALT: 6 U/L (ref 0–44)
AST: 16 U/L (ref 15–41)
Albumin: 3.1 g/dL — ABNORMAL LOW (ref 3.5–5.0)
Alkaline Phosphatase: 68 U/L (ref 38–126)
Anion gap: 8 (ref 5–15)
BUN: 19 mg/dL (ref 8–23)
CO2: 28 mmol/L (ref 22–32)
Calcium: 9.1 mg/dL (ref 8.9–10.3)
Chloride: 103 mmol/L (ref 98–111)
Creatinine, Ser: 1.09 mg/dL — ABNORMAL HIGH (ref 0.44–1.00)
GFR, Estimated: 53 mL/min — ABNORMAL LOW
Glucose, Bld: 117 mg/dL — ABNORMAL HIGH (ref 70–99)
Potassium: 3.7 mmol/L (ref 3.5–5.1)
Sodium: 139 mmol/L (ref 135–145)
Total Bilirubin: 0.2 mg/dL — ABNORMAL LOW (ref 0.3–1.2)
Total Protein: 6.8 g/dL (ref 6.5–8.1)

## 2021-05-08 LAB — URINALYSIS, ROUTINE W REFLEX MICROSCOPIC
Bilirubin Urine: NEGATIVE
Glucose, UA: NEGATIVE mg/dL
Ketones, ur: NEGATIVE mg/dL
Nitrite: NEGATIVE
Protein, ur: NEGATIVE mg/dL
Specific Gravity, Urine: 1.005 (ref 1.005–1.030)
WBC, UA: >50 WBC/hpf — ABNORMAL HIGH (ref 0–5)
pH: 7 (ref 5.0–8.0)

## 2021-05-08 LAB — LACTIC ACID, PLASMA: Lactic Acid, Venous: 0.9 mmol/L (ref 0.5–1.9)

## 2021-05-08 MED ORDER — SODIUM CHLORIDE 0.9 % IV BOLUS
1000.0000 mL | Freq: Once | INTRAVENOUS | Status: AC
Start: 2021-05-08 — End: 2021-05-08
  Administered 2021-05-08: 1000 mL via INTRAVENOUS

## 2021-05-08 MED ORDER — HEPARIN SOD (PORK) LOCK FLUSH 100 UNIT/ML IV SOLN
INTRAVENOUS | Status: AC
Start: 2021-05-08 — End: 2021-05-08
  Administered 2021-05-08: 500 [IU]
  Filled 2021-05-08: qty 5

## 2021-05-08 MED ORDER — CEPHALEXIN 500 MG PO CAPS: 500.0000 mg | ORAL_CAPSULE | Freq: Two times a day (BID) | ORAL | 0 refills | Status: DC

## 2021-05-08 MED ORDER — SODIUM CHLORIDE 0.9 % IV SOLN
1.0000 g | Freq: Once | INTRAVENOUS | Status: AC
  Administered 2021-05-08: 1 g via INTRAVENOUS
  Filled 2021-05-08: qty 10

## 2021-05-08 MED ORDER — CEPHALEXIN 500 MG PO CAPS: 500.0000 mg | ORAL_CAPSULE | Freq: Two times a day (BID) | ORAL | 0 refills | Status: AC

## 2021-05-08 NOTE — ED Notes (Signed)
Son has been updated on plan of care/discharge instructions via phone. He is on the way to pick up, and take pt home with him. Pt will not be returning to her facility ?

## 2021-05-08 NOTE — ED Notes (Signed)
Patient verbalizes understanding of discharge instructions. Opportunity for questioning and answers were provided. Armband removed by staff, pt discharged from ED. Wheeled out to lobby  

## 2021-05-08 NOTE — Discharge Instructions (Signed)
You were given a prescription for antibiotics. Please take the antibiotic prescription fully.  ? ?A culture was sent of your urine today to determine if there is any bacterial growth. If the results of the culture are positive and you require an antibiotic or a change of your prescribed antibiotic you will be contacted by the hospital. If the results are negative you will not be contacted. ? ?You were given information to follow-up with the urologist.  You will need to follow-up in the next 5 to 7 days for reassessment and you will need to have the Foley catheter removed at that time.  Please return to the emergency department for any new or worsening symptoms the meantime. ?

## 2021-05-08 NOTE — ED Provider Notes (Signed)
?[facility] DEPT ?Provider Note ? ? ?CSN: [id number] ?Arrival date & time: 05/08/21  1205 ? ?  ? ?History ? ?Chief Complaint  ?Patient presents with  ? Dysuria  ? ? ?[name] [name] is a 76 y.o. female. ? ?HPI ? ?76 year old female with a history of morbid obesity, vaginal atrophy, menopause, osteopenia, breast cancer, CHF, mitral valve regurgitation, CKD, elevated LFTs, diabetes, MSSA bacteremia, vertebral osteomyelitis, urinary retention, who presents the emergency department today from [facility] for evaluation of dysuria.  She further reports urinary urgency and suprapubic discomfort.  She has not had any fevers she is aware of.  She denies any vomiting or other associated symptoms. ? ?Home Medications ?Prior to Admission medications   ?Medication Sig Start Date End Date Taking? Authorizing Provider  ?cephALEXin (KEFLEX) 500 MG capsule Take 1 capsule (500 mg total) by mouth 2 (two) times daily for 7 days. 05/08/21 05/15/21 Yes Lillymae Duet S, PA-C  ?acetaminophen (TYLENOL) 325 MG tablet Take 2 tablets (650 mg total) by mouth every 6 (six) hours as needed for mild pain (or Fever >/= 101). ?Patient not taking: Reported on 04/28/2021 04/03/21   Oswald Hillock, MD  ?anastrozole (ARIMIDEX) 1 MG tablet Take 1 tablet (1 mg total) by mouth daily. 08/06/20   Magrinat, Virgie Dad, MD  ?carbamazepine (TEGRETOL XR) 200 MG 12 hr tablet Take 200 mg by mouth at bedtime.    [provider]  ?Coenzyme Q10 150 MG CAPS Take 300 mg by mouth at bedtime.    [provider]  ?feeding supplement, GLUCERNA SHAKE, (GLUCERNA SHAKE) LIQD Take 237 mLs by mouth 3 (three) times daily between meals. 04/03/21   Oswald Hillock, MD  ?furosemide (LASIX) 20 MG tablet Take 1 tablet (20 mg total) by mouth daily. 04/04/21   Oswald Hillock, MD  ?Heparin Sod, Pork, Lock Flush (HEPARIN LOCK FLUSH IV) Inject 10-500 Units into the vein See admin instructions. Heparin 100u/ml - 500units (20m) for implanted  ports and Heparin 10u/ml - 50units (525m for all other central venous catheters - every 8 hours. Additionally -  Heparin 10u/ml - 10 units intravenously every 8 hours.    [provider]  ?hydrocortisone (ANUSOL-HC) 2.5 % rectal cream Place 1 application rectally 2 (two) times daily. 12/02/20   LeLevin ErpPA  ?lactulose (CHRONULAC) 10 GM/15ML solution Take 15 mLs (10 g total) by mouth 2 (two) times daily as needed for mild constipation. ?Patient taking differently: Take 10 g by mouth 2 (two) times daily. 04/03/21   LaOswald HillockMD  ?levothyroxine (SYNTHROID) 88 MCG tablet Take 88 mcg by mouth daily before breakfast.    [provider]  ?Magnesium 400 MG TABS Take 400 mg by mouth daily.    [provider]  ?Omega-3 Fatty Acids (FISH OIL) 1200 MG CAPS Take 1,200 mg by mouth daily at 6 (six) AM.    [provider]  ?oxyCODONE (OXY IR/ROXICODONE) 5 MG immediate release tablet Take 1 tablet (5 mg total) by mouth every 6 (six) hours as needed for severe pain. ?Patient not taking: Reported on 04/28/2021 04/03/21   LaOswald HillockMD  ?rosuvastatin (CRESTOR) 5 MG tablet Take 1 tablet (5 mg total) by mouth daily. 01/01/21   Chandrasekhar, MaTerisa StarrMD  ?senna-docusate (SENOKOT-S) 8.6-50 MG tablet Take 1 tablet by mouth 2 (two) times daily. 04/03/21   LaOswald HillockMD  ?sodium chloride 0.9 % injection Inject 10 mLs into the vein every 8 (eight) hours.  [provider]  ?   ? ?Allergies    ?Patient has no known allergies.   ? ?Review of Systems   ?Review of Systems ?See HPI for pertinent positives or negatives. ? ? ?Physical Exam ?Updated Vital Signs ?BP (!) 159/64   Pulse 90   Temp 97.8 ?F (36.6 ?C) (Oral)   Resp 16   Ht '5\' 6"'$  (1.676 m)   Wt 78 kg   SpO2 99%   BMI 27.75 kg/m?  ?Physical Exam ?Vitals and nursing note reviewed.  ?Constitutional:   ?   General: She is not in acute distress. ?   Appearance: She is well-developed.  ?HENT:  ?   Head: Normocephalic and  atraumatic.  ?   Mouth/Throat:  ?   Mouth: Mucous membranes are dry.  ?Eyes:  ?   Conjunctiva/sclera: Conjunctivae normal.  ?Cardiovascular:  ?   Rate and Rhythm: Regular rhythm. Tachycardia present.  ?   Heart sounds: Normal heart sounds. No murmur heard. ?Pulmonary:  ?   Effort: Pulmonary effort is normal. No respiratory distress.  ?   Breath sounds: Normal breath sounds. No wheezing or rhonchi.  ?Abdominal:  ?   General: Bowel sounds are normal.  ?   Palpations: Abdomen is soft.  ?   Tenderness: There is abdominal tenderness (mild suprapubic ttp).  ?Musculoskeletal:     ?   General: No swelling.  ?   Cervical back: Neck supple.  ?Skin: ?   General: Skin is warm and dry.  ?   Capillary Refill: Capillary refill takes less than 2 seconds.  ?Neurological:  ?   Mental Status: She is alert.  ?   Comments: 5/5 strength to the bue, lle weakness which is chronic and unchanged. Normal sensation to the bue/ble  ?Psychiatric:     ?   Mood and Affect: Mood normal.  ? ? ?ED Results / Procedures / Treatments   ?Labs ?(all labs ordered are listed, but only abnormal results are displayed) ?Labs Reviewed  ?URINALYSIS, ROUTINE W REFLEX MICROSCOPIC - Abnormal; Notable for the following components:  ?    Result Value  ? Color, Urine STRAW (*)   ? APPearance HAZY (*)   ? Hgb urine dipstick SMALL (*)   ? Leukocytes,Ua LARGE (*)   ? WBC, UA >50 (*)   ? Bacteria, UA RARE (*)   ? All other components within normal limits  ?CBC WITH DIFFERENTIAL/PLATELET - Abnormal; Notable for the following components:  ? WBC 14.1 (*)   ? RBC 2.90 (*)   ? Hemoglobin 9.7 (*)   ? HCT 29.5 (*)   ? MCV 101.7 (*)   ? Neutro Abs 11.7 (*)   ? Abs Immature Granulocytes 0.20 (*)   ? All other components within normal limits  ?COMPREHENSIVE METABOLIC PANEL - Abnormal; Notable for the following components:  ? Glucose, Bld 117 (*)   ? Creatinine, Ser 1.09 (*)   ? Albumin 3.1 (*)   ? Total Bilirubin 0.2 (*)   ? GFR, Estimated 53 (*)   ? All other components within  normal limits  ?CULTURE, BLOOD (ROUTINE X 2)  ?CULTURE, BLOOD (ROUTINE X 2)  ?LACTIC ACID, PLASMA  ? ? ?EKG ?EKG Interpretation ? ?Date/Time:  Friday May 08 2021 16:34:21 EDT ?Ventricular Rate:  95 ?PR Interval:  138 ?QRS Duration: 93 ?QT Interval:  343 ?QTC Calculation: 432 ?R Axis:   16 ?Text Interpretation: Sinus rhythm Low voltage, precordial leads Abnormal R-wave progression, early transition Since last tracing  rate slower Confirmed by Dorie Rank [phone]) on 05/08/2021 4:41:14 PM ? ?Radiology ?No results found. ? ?Procedures ?Procedures  ? ? ?Medications Ordered in ED ?Medications  ?cefTRIAXone (ROCEPHIN) 1 g in sodium chloride 0.9 % 100 mL IVPB (1 g Intravenous New Bag/Given 05/08/21 1820)  ?sodium chloride 0.9 % bolus 1,000 mL (0 mLs Intravenous Stopped 05/08/21 1820)  ? ? ?ED Course/ Medical Decision Making/ A&P ?  ?                        ?Medical Decision Making ?Amount and/or Complexity of Data Reviewed ?Labs: ordered. ? ?Risk ?Prescription drug management. ? ? ?This patient presents to the ED for concern of dysuria, this involves an extensive number of treatment options, and is a complaint that carries with it a high risk of complications and morbidity.  The differential diagnosis includes but is not limited to uti, gastritis/PUD, enteritis/duodenitis, appendicitis, cholelithiasis/cholecystitis, cholangitis, pancreatitis, ruptured viscus, colitis, diverticulitis, proctitis, cystitis, pyelonephritis, ureteral colic, aortic dissection, aortic aneurysm.  ?  ? ?Comorbidities that complicate the patient evaluation: ?Patient?s presentation is complicated by their history of urinary retention, vertebral osteomyelitis, bacteremia ? ?Additional history obtained: ?Additional history obtained from family ?Records reviewed previous admission documents, Care Everywhere/External Records, and Primary Care Documents ? ?Lab Tests: ?I Ordered, and personally interpreted labs.  The pertinent results include:   ?CBC with mild  leukocytosis and anemia which is grossly stable from prior  ?CMP with mildly elevated cr, otherwise reassuring ?UA consistent with UTI.  Culture sent.  Dose of ceftriaxone given and patient will be discharged o

## 2021-05-08 NOTE — ED Triage Notes (Signed)
Patient is a resident of [facility]. Patient c/o dysuria x 1 week.  ?Patient reports that she has been getting antibiotics for a staph infection. Patient has a PICC line in the Left upper arm. ? ?Patient's son reports that his plan is to take his mother home and not to go back to the facility. ? ?Mother and son are arguing in triage. ?

## 2021-05-13 ENCOUNTER — Inpatient Hospital Stay: Payer: Medicare Other | Admitting: Hematology and Oncology

## 2021-05-13 ENCOUNTER — Inpatient Hospital Stay: Payer: Medicare Other | Attending: Family Medicine

## 2021-05-13 LAB — CULTURE, BLOOD (ROUTINE X 2)
Culture: NO GROWTH
Culture: NO GROWTH
Special Requests: ADEQUATE

## 2021-05-13 NOTE — Progress Notes (Deleted)
?[place]  ?[phone] [phone]  ? ? ? ?ID: [name] [name] DOB: 06-18-1945  MR#: [id number]  HER#:[id number] ? ?Patient Care Team: ?Caprice Renshaw, MD as PCP - General (Internal Medicine) ?Werner Lean, MD as PCP - Cardiology (Cardiology) ?Erroll Luna, MD as Consulting Physician (General Surgery) ?Magrinat, Virgie Dad, MD (Inactive) as Consulting Physician (Oncology) ?Gery Pray, MD as Consulting Physician (Radiation Oncology) ?Mauro Kaufmann, RN as Oncology Nurse Navigator ?Rockwell Germany, RN as Oncology Nurse Navigator ?Mikey Bussing, DDS as Consulting Physician (Dentistry) ?Lorelle Gibbs, MD (Radiology) ?Benay Pike, MD ?OTHER MD: ? ?CHIEF COMPLAINT: Estrogen receptor positive breast cancer ? ?CURRENT TREATMENT: Anastrozole ? ?Oncology History  ?Malignant neoplasm of upper-inner quadrant of right breast in female, estrogen receptor positive ([place])  ?07/30/2019 Initial Diagnosis  ? Malignant neoplasm of upper-inner quadrant of right breast in female, estrogen receptor positive ([place]) ?  ?08/07/2019 Genetic Testing  ? Negative genetic testing:  No pathogenic variants detected on the [facility] Common Hereditary Cancers Panel. The report date is 08/07/2019.  ? ?The Common Hereditary Cancers Panel offered by [facility] includes sequencing and/or deletion duplication testing of the following 48 genes: APC, ATM, AXIN2, BARD1, BMPR1A, BRCA1, BRCA2, BRIP1, CDH1, CDK4, CDKN2A (p14ARF), CDKN2A (p16INK4a), CHEK2, CTNNA1, DICER1, EPCAM (Deletion/duplication testing only), GREM1 (promoter region deletion/duplication testing only), KIT, MEN1, MLH1, MSH2, MSH3, MSH6, MUTYH, NBN, NF1, NHTL1, PALB2, PDGFRA, PMS2, POLD1, POLE, PTEN, RAD50, RAD51C, RAD51D, RNF43, SDHB, SDHC, SDHD, SMAD4, SMARCA4. STK11, TP53, TSC1, TSC2, and VHL.  The following genes were evaluated for sequence changes only: SDHA and HOXB13 c.251G>A variant only.  ?  ?08/29/2019 Cancer Staging  ? Staging form:  Breast, AJCC 8th Edition ?- Pathologic stage from 08/29/2019: Stage IA (pT1c, pN0, cM0, G2, ER+, PR+, HER2-) - Signed by Gardenia Phlegm, NP on 09/19/2019 ? ?  ?12/17/2019 -  Anti-estrogen oral therapy  ? Started anastrazole ?Anticipated duration of therapy, minimum of 5 yrs. ?  ? ? ?INTERVAL HISTORY: ?[name] [name] returns today for follow up of her estrogen receptor positive breast cancer.  ?She started anastrozole December 17, 2019.  She is tolerating this well, with no hot flashes no change in vaginal dryness.  She obtains it at a very good price ?Her most recent bone density screening from 01/25/2020 showed a T-score of -2.8, which is considered osteoporotic. ?Mammogram July 2022 showed no mammographic evidence of malignancy, new posttreatment changes of the right breast. ? ?ROS ? ? ?PAST MEDICAL HISTORY: ?Past Medical History:  ?Diagnosis Date  ? Anxiety   ? being weaned off lithium will be off on 06/23/11  ? Bipolar disorder ([place])   ? Cancer ([place]) 07/2019  ? right breast IDC with DCIS  ? Diastolic heart failure ([place])   ? Diverticulosis 09/01/2011  ? mild, left sided  ? Family history of breast cancer   ? Family history of colon cancer   ? Hyperlipidemia   ? Hypothyroidism   ? IBS (irritable bowel syndrome)   ? Leg cramps   ? Mood disorder ([place])   ? Morbidly obese ([place])   ? Numbness and tingling in left arm   ? Osteopenia   ? Prediabetes   ? Stage 3 chronic kidney disease ([place])   ? CKD stg 3  ? Vitamin D deficiency   ? ? ?PAST SURGICAL HISTORY: ?Past Surgical History:  ?Procedure Laterality Date  ? back biopsy    ? for skin  ? BREAST LUMPECTOMY WITH RADIOACTIVE SEED AND SENTINEL LYMPH NODE BIOPSY Right  08/29/2019  ? Procedure: RIGHT BREAST LUMPECTOMY WITH RADIOACTIVE SEED AND SENTINEL LYMPH NODE BIOPSY;  Surgeon: Erroll Luna, MD;  Location: [facility];  Service: General;  Laterality: Right;  ? COLONOSCOPY  06/09/2006  ? Dr.Orr  ? COLONOSCOPY  09/01/2011  ? DILATATION & CURETTAGE/HYSTEROSCOPY WITH  TRUECLEAR N/A 11/12/2013  ? Procedure: RESECTOSCOPIC POLYPECTOMY Irish Lack, D&C;  Surgeon: Terrance Mass, MD;  Location: [facility];  Service: Gynecology;  Laterality: N/A;  ? ENDOMETRIAL BIOPSY  09/14/2013  ? Dr. Uvaldo Rising  ? RADIOLOGY WITH ANESTHESIA N/A 03/14/2021  ? Procedure: MRI THORASIC AND LUMBAR WITH AND WITHOUT CONTRAST WITH ANESTHESIA;  Surgeon: Radiologist, Medication, MD;  Location: [facility];  Service: Radiology;  Laterality: N/A;  ? ? ?FAMILY HISTORY: ?Family History  ?Problem Relation Age of Onset  ? Diabetes Mother   ? Other Mother   ?     meningioma (multiple)  ? Colon cancer Father 20  ? Cancer - Colon Father 61  ?     died 9  ? Parkinson's disease Sister 5  ? Alzheimer's disease Maternal Grandfather   ? Other Daughter   ?     tiny meningioma outside of the brain  ? Breast cancer Cousin 40  ?     maternal cousin  ? Ovarian cancer Neg Hx   ? Colon polyps Neg Hx   ?The patient is a vascular as the extraction.  Her parents both died at age 66, her father from colon cancer diagnosed a year before his death.  Her mother died from complications of diabetes.  The patient has one sister, no brothers.  ? ? ?GYNECOLOGIC HISTORY:  ?No LMP recorded. Patient is postmenopausal. ?Menarche: 76 years old ?Age at first live birth: 76 years old ?GX P 2 ?LMP unsure ?Contraceptive: previously used ?HRT: previously used for a "few years"  ?Hysterectomy? no ?BSO? no ? ? 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 Husband [name] is a former Education officer, environmental ([place]) and later worked for [employer].. Daughter [name], age 68, works as a Government social research officer for [employer]) in [place]. Son Carlyne Keehan, age 29, works for a Production assistant, radio here in [place].  The patient has 2 grandchildren.  She attends [employer] ?  ? ADVANCED DIRECTIVES: In  place. ? ? ?HEALTH MAINTENANCE: ?Social History  ? ?Tobacco Use  ? Smoking status: Never  ? Smokeless tobacco: Never  ?Vaping Use  ? Vaping Use: Never used  ?Substance Use Topics  ? Alcohol use: No  ?  Alcohol/week: 0.0 standard drinks  ? Drug use: No  ? ? ? Colonoscopy: 08/2011 ? PAP: 2019 ? Bone density: 01/2019, osteopenia ?  ?No Known Allergies ? ?Current Outpatient Medications  ?Medication Sig Dispense Refill  ? acetaminophen (TYLENOL) 325 MG tablet Take 2 tablets (650 mg total) by mouth every 6 (six) hours as needed for mild pain (or Fever >/= 101). (Patient not taking: Reported on 04/28/2021)    ? anastrozole (ARIMIDEX) 1 MG tablet Take 1 tablet (1 mg total) by mouth daily. 90 tablet 4  ? carbamazepine (TEGRETOL XR) 200 MG 12 hr tablet Take 200 mg by mouth at bedtime.    ? cephALEXin (KEFLEX) 500 MG capsule Take 1 capsule (500 mg total) by mouth 2 (two) times daily for 7 days. 14 capsule 0  ? Coenzyme Q10 150 MG  CAPS Take 300 mg by mouth at bedtime.    ? feeding supplement, GLUCERNA SHAKE, (GLUCERNA SHAKE) LIQD Take 237 mLs by mouth 3 (three) times daily between meals.  0  ? furosemide (LASIX) 20 MG tablet Take 1 tablet (20 mg total) by mouth daily. 30 tablet   ? Heparin Sod, Pork, Lock Flush (HEPARIN LOCK FLUSH IV) Inject 10-500 Units into the vein See admin instructions. Heparin 100u/ml - 500units (79m) for implanted ports and Heparin 10u/ml - 50units (546m for all other central venous catheters - every 8 hours. Additionally -  Heparin 10u/ml - 10 units intravenously every 8 hours.    ? hydrocortisone (ANUSOL-HC) 2.5 % rectal cream Place 1 application rectally 2 (two) times daily. 30 g 1  ? lactulose (CHRONULAC) 10 GM/15ML solution Take 15 mLs (10 g total) by mouth 2 (two) times daily as needed for mild constipation. (Patient taking differently: Take 10 g by mouth 2 (two) times daily.) 236 mL 0  ? levothyroxine (SYNTHROID) 88 MCG tablet Take 88 mcg by mouth daily before breakfast.    ? Magnesium 400 MG TABS  Take 400 mg by mouth daily.    ? Omega-3 Fatty Acids (FISH OIL) 1200 MG CAPS Take 1,200 mg by mouth daily at 6 (six) AM.    ? oxyCODONE (OXY IR/ROXICODONE) 5 MG immediate release tablet Take 1 tablet (5 mg tota

## 2021-05-15 DIAGNOSIS — R339 Retention of urine, unspecified: Secondary | ICD-10-CM | POA: Diagnosis not present

## 2021-05-15 DIAGNOSIS — Z6841 Body Mass Index (BMI) 40.0 and over, adult: Secondary | ICD-10-CM | POA: Diagnosis not present

## 2021-05-15 DIAGNOSIS — N952 Postmenopausal atrophic vaginitis: Secondary | ICD-10-CM | POA: Diagnosis not present

## 2021-05-15 DIAGNOSIS — I509 Heart failure, unspecified: Secondary | ICD-10-CM | POA: Diagnosis not present

## 2021-05-15 DIAGNOSIS — E1122 Type 2 diabetes mellitus with diabetic chronic kidney disease: Secondary | ICD-10-CM | POA: Diagnosis not present

## 2021-05-15 DIAGNOSIS — Z853 Personal history of malignant neoplasm of breast: Secondary | ICD-10-CM | POA: Diagnosis not present

## 2021-05-15 DIAGNOSIS — N390 Urinary tract infection, site not specified: Secondary | ICD-10-CM | POA: Diagnosis not present

## 2021-05-15 DIAGNOSIS — M4620 Osteomyelitis of vertebra, site unspecified: Secondary | ICD-10-CM | POA: Diagnosis not present

## 2021-05-15 DIAGNOSIS — M8580 Other specified disorders of bone density and structure, unspecified site: Secondary | ICD-10-CM | POA: Diagnosis not present

## 2021-05-15 DIAGNOSIS — N189 Chronic kidney disease, unspecified: Secondary | ICD-10-CM | POA: Diagnosis not present

## 2021-05-15 DIAGNOSIS — I051 Rheumatic mitral insufficiency: Secondary | ICD-10-CM | POA: Diagnosis not present

## 2021-05-15 DIAGNOSIS — Z79899 Other long term (current) drug therapy: Secondary | ICD-10-CM | POA: Diagnosis not present

## 2021-05-15 DIAGNOSIS — Z9181 History of falling: Secondary | ICD-10-CM | POA: Diagnosis not present

## 2021-05-19 ENCOUNTER — Encounter: Payer: Self-pay | Admitting: Internal Medicine

## 2021-05-19 ENCOUNTER — Other Ambulatory Visit: Payer: Self-pay

## 2021-05-19 ENCOUNTER — Ambulatory Visit (INDEPENDENT_AMBULATORY_CARE_PROVIDER_SITE_OTHER): Payer: Medicare Other | Admitting: Internal Medicine

## 2021-05-19 VITALS — BP 119/75 | HR 100 | Temp 97.5°F | Resp 16

## 2021-05-19 DIAGNOSIS — K6812 Psoas muscle abscess: Secondary | ICD-10-CM

## 2021-05-19 DIAGNOSIS — R7881 Bacteremia: Secondary | ICD-10-CM

## 2021-05-19 DIAGNOSIS — G062 Extradural and subdural abscess, unspecified: Secondary | ICD-10-CM | POA: Diagnosis not present

## 2021-05-19 DIAGNOSIS — B9561 Methicillin susceptible Staphylococcus aureus infection as the cause of diseases classified elsewhere: Secondary | ICD-10-CM

## 2021-05-19 NOTE — Patient Instructions (Signed)
Please follow up with urology to remove foley ? ?Follow up with your dentist for potential dental carries ? ? ?I have ordered mri for your back; will check blood test as well ? ?See me in 4 weeks ? ?If fever, chill, increased back pain see me sooner ?

## 2021-05-19 NOTE — Progress Notes (Signed)
?  ? ? ? ? ?[facility] for Infectious Disease ? ?Patient Active Problem List  ? Diagnosis Date Noted  ? Generalized weakness   ? Acute urinary retention 03/19/2021  ? Abscess   ? Vertebral osteomyelitis ([place])   ? Thrush, oral 03/12/2021  ? Morbid obesity ([place]) 03/11/2021  ? MSSA bacteremia 03/09/2021  ? Sepsis ([place]) 03/08/2021  ? Acute renal failure superimposed on stage 3b chronic kidney disease ([place]) 03/08/2021  ? Elevated LFTs 03/08/2021  ? DMII (diabetes mellitus, type 2) ([place]) 03/08/2021  ? Normocytic anemia 03/08/2021  ? Fall at home, initial encounter 03/08/2021  ? Hypothyroidism 03/08/2021  ? Chronic diastolic heart failure ([place]) 01/01/2021  ? Nonrheumatic mitral valve regurgitation 01/01/2021  ? Stage 3a chronic kidney disease ([place]) 01/01/2021  ? Genetic testing 08/10/2019  ? Family history of breast cancer   ? Family history of colon cancer   ? Malignant neoplasm of upper-inner quadrant of right breast in female, estrogen receptor positive ([place]) 07/30/2019  ? Osteopenia 09/10/2016  ? Vaginal atrophy 09/10/2015  ? Menopause 09/10/2015  ? ? ? ? ?Subjective:  ? ? Patient ID: [name] [name], female    DOB: 1945/07/16, 76 y.o.   MRN: [id number] ? ?Chief Complaint  ?Patient presents with  ? Hospitalization Follow-up  ?  Uti/Sepsis - patient reports she is feeling better. Patient still has PICC line in. EOT was 3/21. Patient has bruising left lower arm.   ? ? ?HPI: ? ?[name] [name] is a 76 y.o. female here for follow up with staph aureus bacteremia ? ?She still has picc ?She was to finish abx on 3/21 ? ?She missed an appointment with [place]  ? ?Chart reviewed -- she was in ed on 3/24 for dysuria. Given a dose of ceftriaxone and 7 days cephalexin. I reviewed the urine testing --> rare bacteria, >50 wbc, no reflex urine cx done. Blood cx on 3/24 was negative. She was also seen in ed on 3/14 for a picc replacewment (?2 days missed cefazolin from nursing home) ? ? ?Her son is with her today -- complains she is not  as active, very weak. She is in wheelchair ? ?She has foley catheter placed by nursing home 2-3 weeks ago. When she visted the ed on 3/24 urology referral.  ? ?She has some dental carries as well and her son had set her up for dentist ? ? ?No Known Allergies ? ? ? ?Outpatient Medications Prior to Visit  ?Medication Sig Dispense Refill  ? anastrozole (ARIMIDEX) 1 MG tablet Take 1 tablet (1 mg total) by mouth daily. 90 tablet 4  ? carbamazepine (TEGRETOL XR) 200 MG 12 hr tablet Take 200 mg by mouth at bedtime.    ? Coenzyme Q10 150 MG CAPS Take 300 mg by mouth at bedtime.    ? feeding supplement, GLUCERNA SHAKE, (GLUCERNA SHAKE) LIQD Take 237 mLs by mouth 3 (three) times daily between meals.  0  ? furosemide (LASIX) 20 MG tablet Take 1 tablet (20 mg total) by mouth daily. 30 tablet   ? Heparin Sod, Pork, Lock Flush (HEPARIN LOCK FLUSH IV) Inject 10-500 Units into the vein See admin instructions. Heparin 100u/ml - 500units (57m) for implanted ports and Heparin 10u/ml - 50units (545m for all other central venous catheters - every 8 hours. Additionally -  Heparin 10u/ml - 10 units intravenously every 8 hours.    ? hydrocortisone (ANUSOL-HC) 2.5 % rectal cream Place 1 application rectally 2 (two) times daily. 30 g 1  ?  lactulose (CHRONULAC) 10 GM/15ML solution Take 15 mLs (10 g total) by mouth 2 (two) times daily as needed for mild constipation. (Patient taking differently: Take 10 g by mouth 2 (two) times daily.) 236 mL 0  ? levothyroxine (SYNTHROID) 88 MCG tablet Take 88 mcg by mouth daily before breakfast.    ? Magnesium 400 MG TABS Take 400 mg by mouth daily.    ? Omega-3 Fatty Acids (FISH OIL) 1200 MG CAPS Take 1,200 mg by mouth daily at 6 (six) AM.    ? rosuvastatin (CRESTOR) 5 MG tablet Take 1 tablet (5 mg total) by mouth daily. 90 tablet 3  ? senna-docusate (SENOKOT-S) 8.6-50 MG tablet Take 1 tablet by mouth 2 (two) times daily.    ? sodium chloride 0.9 % injection Inject 10 mLs into the vein every 8 (eight)  hours.    ? acetaminophen (TYLENOL) 325 MG tablet Take 2 tablets (650 mg total) by mouth every 6 (six) hours as needed for mild pain (or Fever >/= 101). (Patient not taking: Reported on 05/19/2021)    ? oxyCODONE (OXY IR/ROXICODONE) 5 MG immediate release tablet Take 1 tablet (5 mg total) by mouth every 6 (six) hours as needed for severe pain. (Patient not taking: Reported on 04/28/2021) 10 tablet 0  ? ?No facility-administered medications prior to visit.  ? ? ? ?Social History  ? ?Socioeconomic History  ? Marital status: Married  ?  Spouse name: Daphene Jaeger Gergen  ? Number of children: 2  ? Years of education: 7  ? Highest education level: High school graduate  ?Occupational History  ? Occupation: Retired  ?Tobacco Use  ? Smoking status: Never  ? Smokeless tobacco: Never  ?Vaping Use  ? Vaping Use: Never used  ?Substance and Sexual Activity  ? Alcohol use: No  ?  Alcohol/week: 0.0 standard drinks  ? Drug use: No  ? Sexual activity: Not Currently  ?  Birth control/protection: Post-menopausal  ?Other Topics Concern  ? Not on file  ?Social History Narrative  ? Married, Retired  ? 1 son - Tuana Hoheisel, works in [employer]   ? 1 daughter - [name], works as a Hospital doctor  ? ?Social Determinants of Health  ? ?Financial Resource Strain: Not on file  ?Food Insecurity: Not on file  ?Transportation Needs: Not on file  ?Physical Activity: Not on file  ?Stress: Not on file  ?Social Connections: Not on file  ?Intimate Partner Violence: Not on file  ? ? ? ? ?Review of Systems ?   ?All other ros negative ? ?Objective:  ?  ?BP 119/75   Pulse 100   Temp (!) 97.5 ?F (36.4 ?C) (Temporal)   Resp 16   SpO2 93%  ?Nursing note and vital signs reviewed. ? ?Physical Exam ? ?   ?General/constitutional: no distress, pleasant ?HEENT: Normocephalic, PER, Conj Clear, EOMI, Oropharynx clear ?Neck supple ?CV: rrr no mrg ?Lungs: clear to auscultation, normal respiratory effort ?Abd: Soft, Nontender ?Ext:  trace bilateral le edema to knees ?Skin: No Rash ?Neuro: see below ?MSK: no peripheral joint swelling/tenderness/warmth; back spines nontender ? ?In wheel chair; strength bilateral le 4-5 /5 symmetric ? ?Central line presence: site no erythema/tenderness -- removed by me ? ? ?Labs: ? ?Micro: ? ?Serology: ? ?Imaging: ? ?Assessment & Plan:  ? ?Problem List Items Addressed This Visit   ?None ?Visit Diagnoses   ? ? MSSA bacteremia    -  Primary  ? Relevant Orders  ? CBC  ?  COMPLETE METABOLIC PANEL WITH GFR  ? C-reactive protein  ? MR THORACIC SPINE WO CONTRAST  ? MR LUMBAR SPINE WO CONTRAST  ? Epidural abscess      ? Relevant Orders  ? CBC  ? COMPLETE METABOLIC PANEL WITH GFR  ? C-reactive protein  ? MR THORACIC SPINE WO CONTRAST  ? MR LUMBAR SPINE WO CONTRAST  ? 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 Relevant Orders  ? CBC  ? COMPLETE METABOLIC PANEL WITH GFR  ? C-reactive protein  ? MR THORACIC SPINE WO CONTRAST  ? MR LUMBAR SPINE WO CONTRAST  ? ?  ? ? ? ? ?No orders of the defined types were placed in this encounter. ? ? ? ?#MSSA bacteremia ?#Epidural phlegmon/dural thickening ?#psoas abscess ?MRI T/L spine on 01/28 showed extensive epidural phlegmon/dural thickening thoracolumbar and left psoas and right paraspinal abscess.  ?TTE showed no obvious vegetations ?CRP trending down 23(2/1)->8.4(2/4). Leukocytosis resolved. Pt is on cefazolin, nafcillin held to avoid AIN/transaminitis (never received) ? ?She had received 8 weeks cefazolin by 3/21 ?Given the mri finding, I would like to repeat imaging ? ?She is here without any snf labs ? ?Recent urinary sx/retention s/p foley placement dx'ed with uti in ed 3/24. Foley remained in; has urology follow up ? ?Also complains of dental pain needing dental visit ? ? ?-cbc, cmp, crp today ?-mri thoracic and lumbar spine ?-remove picc order for nursing home ?-follow up in 4 weeks to continue monitoring for sign of infection given extensive back involvement ?-f/u  urology/dentist ? ? ?Follow-up: Return in about 4 weeks (around 06/16/2021). ? ? ? ? ? ?Jabier Mutton, MD ?[facility] for Infectious Disease ?[place] ?[phone]  pager   [phone] cell ?05/19/2021, 11:32 AM ? ?

## 2021-05-19 NOTE — Progress Notes (Unsigned)
Dr.Vu removed patient PICC line while in office today. Patient EOT was 05/05/2021. Patient was discharged from hospital to SNF on 04/03/2021. Patient no showed last appointment with Dr.Vu. Routing to [facility] pharmacy team so they're aware.  ? ? ?[name] [name] P Lenis Nettleton, CMA ? ?

## 2021-05-20 ENCOUNTER — Telehealth: Payer: Self-pay

## 2021-05-20 ENCOUNTER — Other Ambulatory Visit: Payer: Self-pay | Admitting: Pharmacist

## 2021-05-20 ENCOUNTER — Other Ambulatory Visit (HOSPITAL_COMMUNITY): Payer: Self-pay

## 2021-05-20 DIAGNOSIS — R7881 Bacteremia: Secondary | ICD-10-CM

## 2021-05-20 DIAGNOSIS — K6812 Psoas muscle abscess: Secondary | ICD-10-CM

## 2021-05-20 DIAGNOSIS — G062 Extradural and subdural abscess, unspecified: Secondary | ICD-10-CM

## 2021-05-20 LAB — CBC
HCT: 29.7 % — ABNORMAL LOW (ref 35.0–45.0)
Hemoglobin: 9.9 g/dL — ABNORMAL LOW (ref 11.7–15.5)
MCH: 32.1 pg (ref 27.0–33.0)
MCHC: 33.3 g/dL (ref 32.0–36.0)
MCV: 96.4 fL (ref 80.0–100.0)
MPV: 10 fL (ref 7.5–12.5)
Platelets: 345 10*3/uL (ref 140–400)
RBC: 3.08 10*6/uL — ABNORMAL LOW (ref 3.80–5.10)
RDW: 13.5 % (ref 11.0–15.0)
WBC: 12.8 10*3/uL — ABNORMAL HIGH (ref 3.8–10.8)

## 2021-05-20 LAB — C-REACTIVE PROTEIN: CRP: 242.1 mg/L — ABNORMAL HIGH (ref <8.0)

## 2021-05-20 LAB — COMPLETE METABOLIC PANEL WITH GFR
AG Ratio: 1 (calc) (ref 1.0–2.5)
ALT: 13 U/L (ref 6–29)
AST: 20 U/L (ref 10–35)
Albumin: 3.6 g/dL (ref 3.6–5.1)
Alkaline phosphatase (APISO): 81 U/L (ref 37–153)
BUN/Creatinine Ratio: 13 (calc) (ref 6–22)
BUN: 16 mg/dL (ref 7–25)
CO2: 30 mmol/L (ref 20–32)
Calcium: 9.6 mg/dL (ref 8.6–10.4)
Chloride: 97 mmol/L — ABNORMAL LOW (ref 98–110)
Creat: 1.19 mg/dL — ABNORMAL HIGH (ref 0.60–1.00)
Globulin: 3.5 g/dL (calc) (ref 1.9–3.7)
Glucose, Bld: 105 mg/dL — ABNORMAL HIGH (ref 65–99)
Potassium: 3.9 mmol/L (ref 3.5–5.3)
Sodium: 139 mmol/L (ref 135–146)
Total Bilirubin: 0.3 mg/dL (ref 0.2–1.2)
Total Protein: 7.1 g/dL (ref 6.1–8.1)
eGFR: 47 mL/min/{1.73_m2} — ABNORMAL LOW (ref >=60)

## 2021-05-20 MED ORDER — CEFADROXIL 1 G PO TABS: 1.0000 g | ORAL_TABLET | Freq: Two times a day (BID) | ORAL | 0 refills | Status: DC

## 2021-05-20 NOTE — Telephone Encounter (Signed)
Sent cefadroxil 1gm BID x 6 weeks to [facility]. If they call and state that it is too expensive, I will resend in the 500 mg tablets instead. Just let me know. Thanks!

## 2021-05-20 NOTE — Telephone Encounter (Signed)
-----   Message from Jabier Mutton, MD sent at 05/20/2021  9:25 AM EDT ----- ?Hi team,  ?Could we rx cefadroxil 1 g bid for this lady, lets do 6 weeks ? ?Her crp is still very high nd she been looking pretty weak, in setting of mssa sepsis/psoas abscess/osteomyelitis of spine ? ? ?Please make sure she has follow up with me in around 4 to 5 weeks ? ?[facility] could you help me with the rx. I am on my phone and cant place any order at this time? ? ?Thank you ?

## 2021-05-20 NOTE — Telephone Encounter (Signed)
Left voicemail with patient son [name] asking him to return my call. ? ? ? ?Teren Zurcher P Gracilyn Gunia, CMA ? ?

## 2021-05-20 NOTE — Telephone Encounter (Signed)
Patient son [name] [name] aware of results and need for Cefadroxil. [name] [name] requested Rx to be sent to [facility] on [facility].  ? ? ?Kimoni Pagliarulo P Maycee Blasco, CMA ? ?

## 2021-05-20 NOTE — Telephone Encounter (Signed)
Left patient son [name] [name] a VM informing medication has been sent to [facility] if too expensive to call our office back. ? ? ?[name] [name], CMA ? ?

## 2021-05-21 DIAGNOSIS — R338 Other retention of urine: Secondary | ICD-10-CM | POA: Diagnosis not present

## 2021-05-28 ENCOUNTER — Encounter (HOSPITAL_COMMUNITY): Payer: Self-pay

## 2021-05-28 ENCOUNTER — Telehealth: Payer: Self-pay

## 2021-05-28 ENCOUNTER — Ambulatory Visit (HOSPITAL_COMMUNITY)
Admission: RE | Admit: 2021-05-28 | Discharge: 2021-05-28 | Disposition: A | Discharge status: Home / Self Care | Payer: Medicare Other | Source: Ambulatory Visit | Attending: Internal Medicine | Admitting: Internal Medicine

## 2021-05-28 DIAGNOSIS — K6812 Psoas muscle abscess: Secondary | ICD-10-CM

## 2021-05-28 DIAGNOSIS — B9561 Methicillin susceptible Staphylococcus aureus infection as the cause of diseases classified elsewhere: Secondary | ICD-10-CM

## 2021-05-28 DIAGNOSIS — R7881 Bacteremia: Secondary | ICD-10-CM

## 2021-05-28 DIAGNOSIS — G062 Extradural and subdural abscess, unspecified: Secondary | ICD-10-CM

## 2021-05-28 NOTE — Telephone Encounter (Signed)
Patient son returned call. He stated patient is saying she will need to be put to sleep. I asked if patient could take something to calm her nerves prior to MRI - she stated no she would need to be put to sleep. I advised I would send message back to Dr.Vu.  ? ? ?Sherilee Smotherman P Andrianna Manalang, CMA  ?

## 2021-05-28 NOTE — Telephone Encounter (Signed)
Left message with patient son Bellamia Ferch) asking him to return my call. I received secure chat from April Pait at [facility] outpatient imaging stating that patient is requesting anesthesia for MRI and to fax over H&P form to be completed by ordering MD.  ? ? ?Saadiq Poche P Neko Boyajian, CMA ? ?

## 2021-06-02 NOTE — Telephone Encounter (Signed)
Left VM with patient son [name] asking that he return to my call. ? ? ?[name] [name] [name] [name], CMA ? ?

## 2021-06-14 DIAGNOSIS — M8580 Other specified disorders of bone density and structure, unspecified site: Secondary | ICD-10-CM | POA: Diagnosis not present

## 2021-06-14 DIAGNOSIS — I509 Heart failure, unspecified: Secondary | ICD-10-CM | POA: Diagnosis not present

## 2021-06-14 DIAGNOSIS — E1122 Type 2 diabetes mellitus with diabetic chronic kidney disease: Secondary | ICD-10-CM | POA: Diagnosis not present

## 2021-06-14 DIAGNOSIS — N952 Postmenopausal atrophic vaginitis: Secondary | ICD-10-CM | POA: Diagnosis not present

## 2021-06-14 DIAGNOSIS — I051 Rheumatic mitral insufficiency: Secondary | ICD-10-CM | POA: Diagnosis not present

## 2021-06-14 DIAGNOSIS — Z9181 History of falling: Secondary | ICD-10-CM | POA: Diagnosis not present

## 2021-06-14 DIAGNOSIS — R339 Retention of urine, unspecified: Secondary | ICD-10-CM | POA: Diagnosis not present

## 2021-06-14 DIAGNOSIS — Z853 Personal history of malignant neoplasm of breast: Secondary | ICD-10-CM | POA: Diagnosis not present

## 2021-06-14 DIAGNOSIS — Z6841 Body Mass Index (BMI) 40.0 and over, adult: Secondary | ICD-10-CM | POA: Diagnosis not present

## 2021-06-14 DIAGNOSIS — M4620 Osteomyelitis of vertebra, site unspecified: Secondary | ICD-10-CM | POA: Diagnosis not present

## 2021-06-14 DIAGNOSIS — N189 Chronic kidney disease, unspecified: Secondary | ICD-10-CM | POA: Diagnosis not present

## 2021-06-14 DIAGNOSIS — Z79899 Other long term (current) drug therapy: Secondary | ICD-10-CM | POA: Diagnosis not present

## 2021-06-14 DIAGNOSIS — N390 Urinary tract infection, site not specified: Secondary | ICD-10-CM | POA: Diagnosis not present

## 2021-06-17 ENCOUNTER — Ambulatory Visit: Payer: Medicare Other | Admitting: Internal Medicine

## 2021-06-19 DIAGNOSIS — R338 Other retention of urine: Secondary | ICD-10-CM | POA: Diagnosis not present

## 2021-06-26 DIAGNOSIS — N1832 Chronic kidney disease, stage 3b: Secondary | ICD-10-CM | POA: Diagnosis not present

## 2021-06-26 DIAGNOSIS — R7303 Prediabetes: Secondary | ICD-10-CM | POA: Diagnosis not present

## 2021-06-26 DIAGNOSIS — E039 Hypothyroidism, unspecified: Secondary | ICD-10-CM | POA: Diagnosis not present

## 2021-06-26 DIAGNOSIS — I129 Hypertensive chronic kidney disease with stage 1 through stage 4 chronic kidney disease, or unspecified chronic kidney disease: Secondary | ICD-10-CM | POA: Diagnosis not present

## 2021-06-29 DIAGNOSIS — E1122 Type 2 diabetes mellitus with diabetic chronic kidney disease: Secondary | ICD-10-CM | POA: Diagnosis not present

## 2021-06-29 DIAGNOSIS — N189 Chronic kidney disease, unspecified: Secondary | ICD-10-CM | POA: Diagnosis not present

## 2021-06-29 DIAGNOSIS — I509 Heart failure, unspecified: Secondary | ICD-10-CM | POA: Diagnosis not present

## 2021-06-29 DIAGNOSIS — M4620 Osteomyelitis of vertebra, site unspecified: Secondary | ICD-10-CM | POA: Diagnosis not present

## 2021-07-23 DIAGNOSIS — D649 Anemia, unspecified: Secondary | ICD-10-CM | POA: Diagnosis not present

## 2021-07-23 DIAGNOSIS — N1832 Chronic kidney disease, stage 3b: Secondary | ICD-10-CM | POA: Diagnosis not present

## 2021-07-23 DIAGNOSIS — F349 Persistent mood [affective] disorder, unspecified: Secondary | ICD-10-CM | POA: Diagnosis not present

## 2021-07-23 DIAGNOSIS — I129 Hypertensive chronic kidney disease with stage 1 through stage 4 chronic kidney disease, or unspecified chronic kidney disease: Secondary | ICD-10-CM | POA: Diagnosis not present

## 2021-07-29 DIAGNOSIS — R338 Other retention of urine: Secondary | ICD-10-CM | POA: Diagnosis not present

## 2021-09-03 DIAGNOSIS — R441 Visual hallucinations: Secondary | ICD-10-CM | POA: Diagnosis not present

## 2021-09-03 DIAGNOSIS — F349 Persistent mood [affective] disorder, unspecified: Secondary | ICD-10-CM | POA: Diagnosis not present

## 2021-09-03 DIAGNOSIS — E039 Hypothyroidism, unspecified: Secondary | ICD-10-CM | POA: Diagnosis not present

## 2021-10-28 ENCOUNTER — Other Ambulatory Visit (HOSPITAL_COMMUNITY): Payer: Medicare Other

## 2021-10-29 ENCOUNTER — Telehealth (HOSPITAL_COMMUNITY): Payer: Self-pay | Admitting: Internal Medicine

## 2021-10-29 NOTE — Telephone Encounter (Signed)
Patient called and cancelled echocardiogram for reason below:  10/27/2021 2:34 PM [id number], YVETTE  Cancel Rsn: Patient (son called to cancel gave no reson as to why she wanted to cancel.)  Order will be removed from the echo WQ and if patient calls back to reschedule we will reinstate orders.

## 2021-11-19 ENCOUNTER — Telehealth: Payer: Self-pay

## 2021-11-19 NOTE — Patient Outreach (Signed)
  Care Coordination   11/19/2021 Name: [name] [name] MRN: [id number] DOB: 12-13-1945   Care Coordination Outreach Attempts:  An unsuccessful telephone outreach was attempted today to offer the patient information about available care coordination services as a benefit of their health plan.   Follow Up Plan:  Additional outreach attempts will be made to offer the patient care coordination information and services.   Encounter Outcome:  No Answer  Care Coordination Interventions Activated:  No   Care Coordination Interventions:  No, not indicated    Jone Baseman, RN, MSN [facility] Care Management Care Management Coordinator Direct Line [phone]

## 2021-12-02 ENCOUNTER — Telehealth: Payer: Self-pay

## 2021-12-02 NOTE — Patient Outreach (Signed)
  Care Coordination   12/02/2021 Name: [name] MRN: [id number] DOB: January 24, 1946   Care Coordination Outreach Attempts:  A second unsuccessful outreach was attempted today to offer the patient with information about available care coordination services as a benefit of their health plan.     Follow Up Plan:  Additional outreach attempts will be made to offer the patient care coordination information and services.   Encounter Outcome:  No Answer  Care Coordination Interventions Activated:  No   Care Coordination Interventions:  No, not indicated    Jone Baseman, RN, MSN [facility] Care Management Care Management Coordinator Direct Line [phone]

## 2021-12-17 ENCOUNTER — Telehealth: Payer: Self-pay

## 2021-12-17 NOTE — Patient Outreach (Signed)
  Care Coordination   12/17/2021 Name: Sukhmani Fetherolf MRN: [id number] DOB: [birth year]   Care Coordination Outreach Attempts:  A third unsuccessful outreach was attempted today to offer the patient with information about available care coordination services as a benefit of their health plan.   Follow Up Plan:  No further outreach attempts will be made at this time. We have been unable to contact the patient to offer or enroll patient in care coordination services  Encounter Outcome:  No Answer  Care Coordination Interventions Activated:  No   Care Coordination Interventions:  No, not indicated    Jone Baseman, RN, MSN [place] [facility] Care Management Coordinator Direct Line [phone]

## 2022-06-16 IMAGING — DX DG WRIST COMPLETE 3+V*R*
4 series · 4 of 4 positions shown · non-contrast
Comparison: None.

CLINICAL DATA: Patient status post fall.  Right wrist pain.

EXAM:
RIGHT WRIST - COMPLETE 3+ VIEW

[wrist ap (1 of 2)]
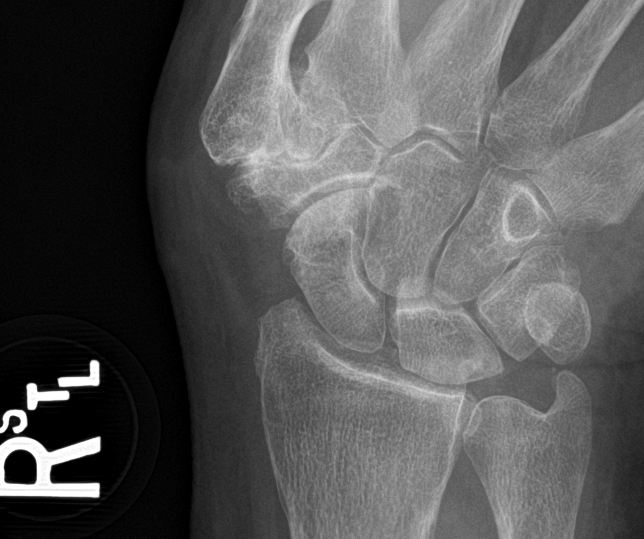

[wrist obl]
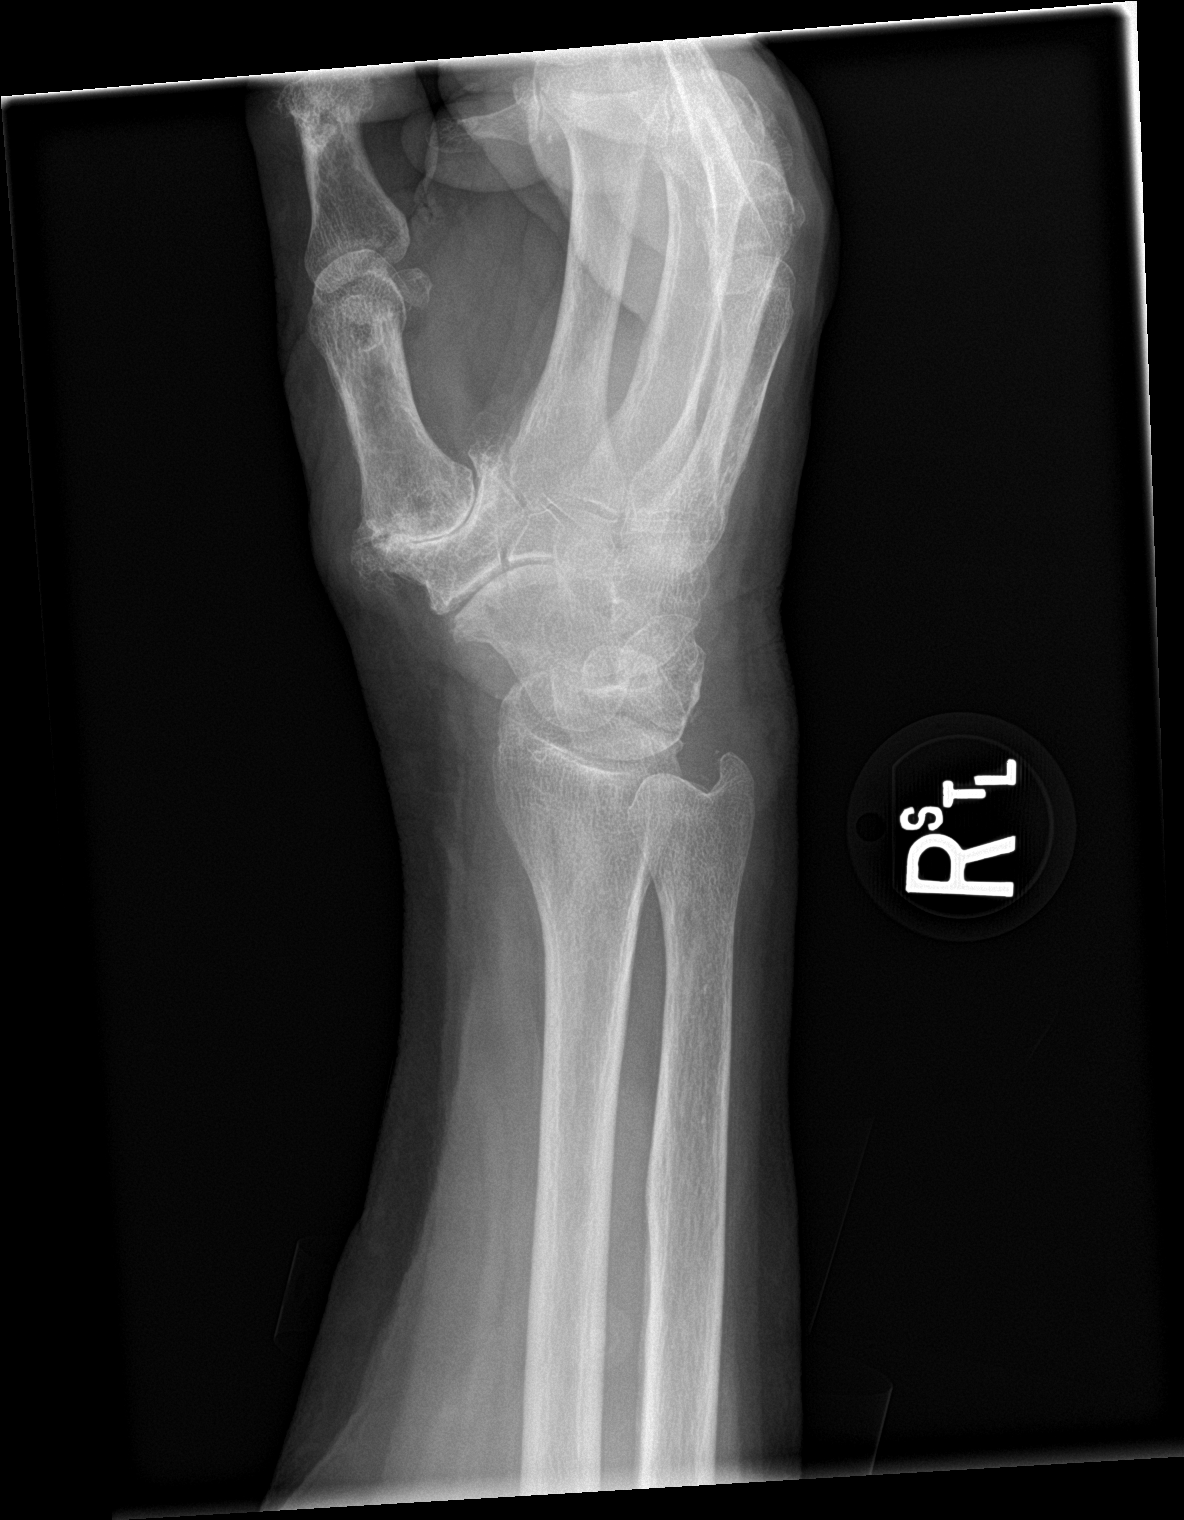

[wrist lat]
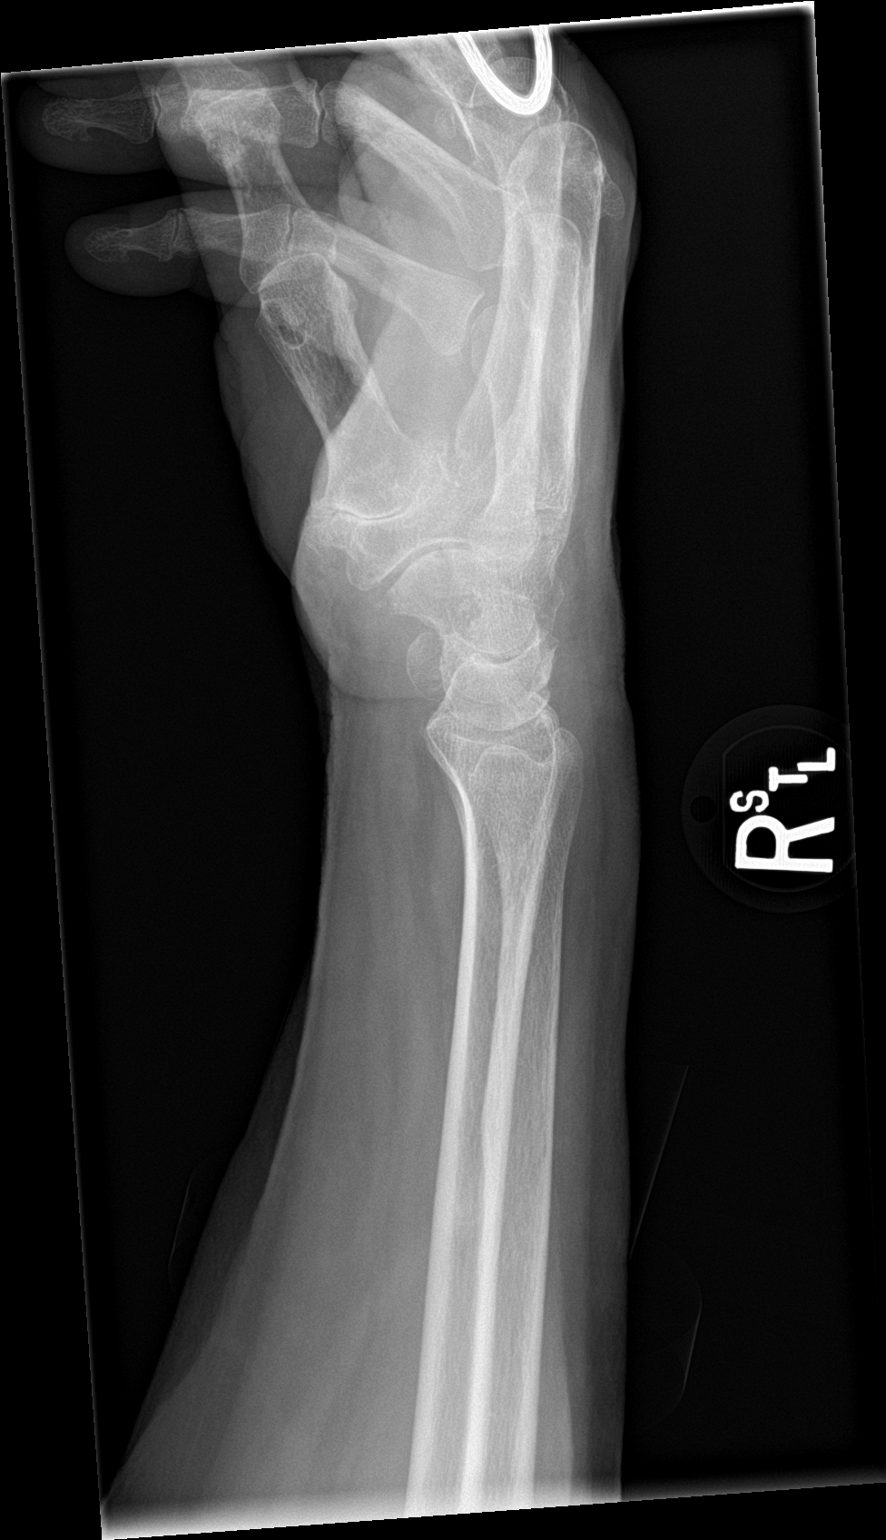

[wrist ap (2 of 2)]
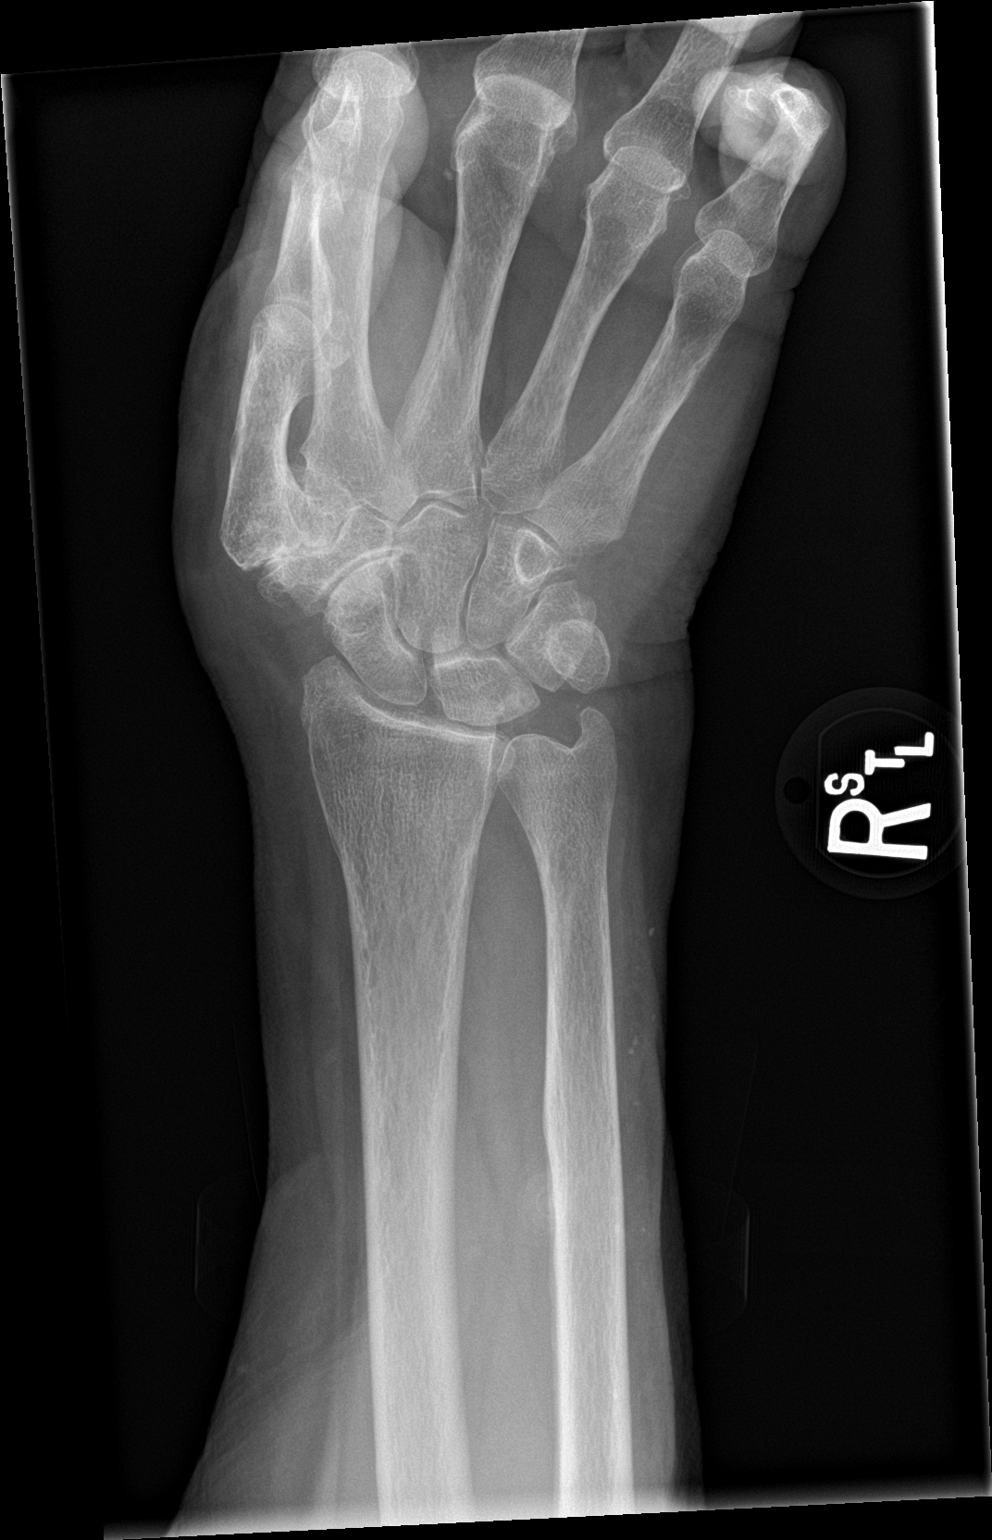

[4 of 4 positions shown; findings below may reference images not displayed]

FINDINGS: First MCP joint degenerative changes. Normal anatomic alignment. No
evidence for acute fracture or dislocation.
IMPRESSION: First MCP joint degenerative changes.  No acute osseous abnormality.

## 2022-06-17 IMAGING — DX DG SHOULDER 1V*L*
1 series · 1 of 1 positions shown · non-contrast
Comparison: None.

CLINICAL DATA: Left shoulder pain

EXAM:
LEFT SHOULDER

[shoulder ap]
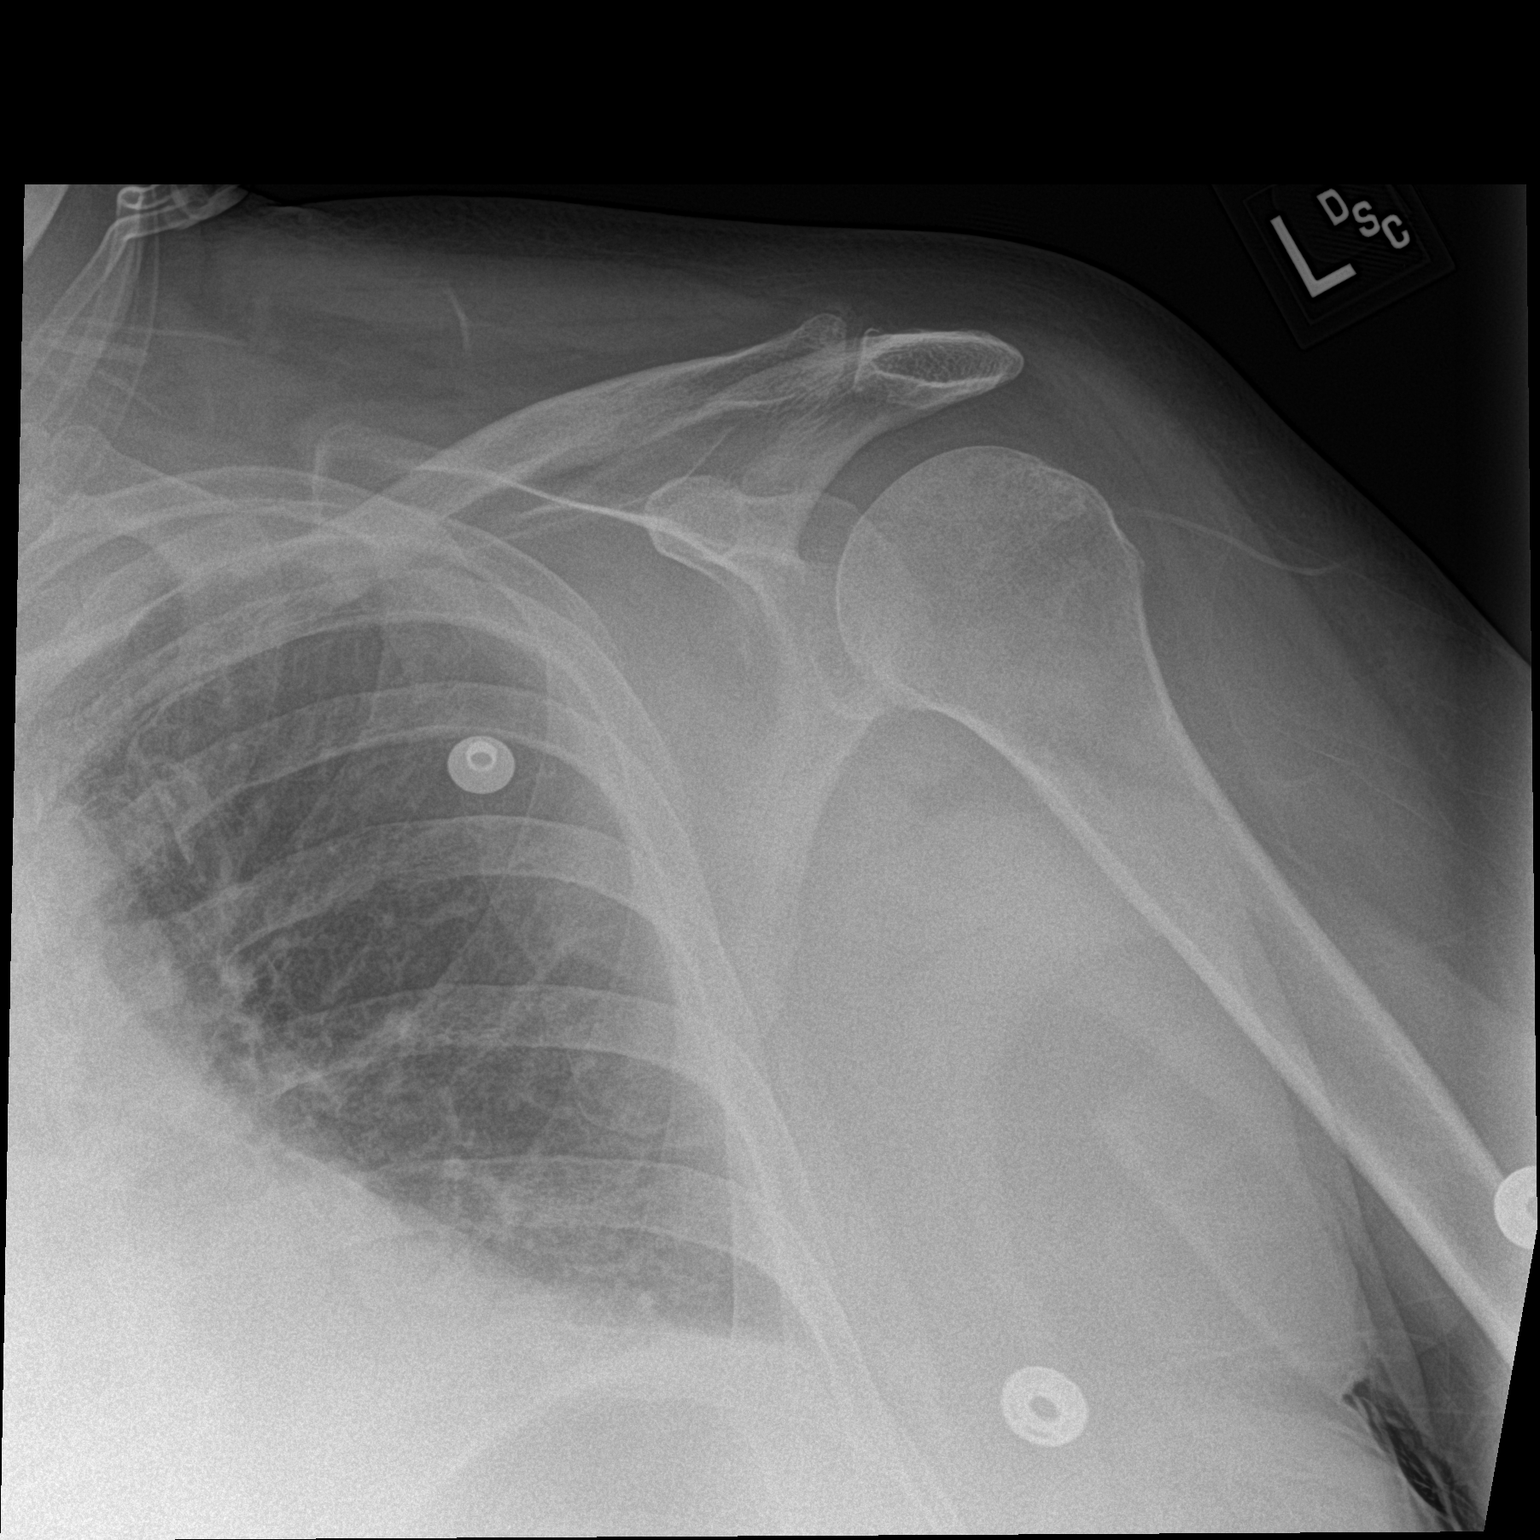

[1 of 1 positions shown; findings below may reference images not displayed]

FINDINGS: There is no evidence of fracture or dislocation. There is no
evidence of arthropathy or other focal bone abnormality. Soft
tissues are unremarkable.
IMPRESSION: No acute osseous abnormality identified.

## 2022-06-30 ENCOUNTER — Encounter (HOSPITAL_COMMUNITY): Payer: Self-pay | Admitting: Pharmacy Technician

## 2022-06-30 ENCOUNTER — Emergency Department (HOSPITAL_COMMUNITY): Payer: Medicare Other

## 2022-06-30 ENCOUNTER — Inpatient Hospital Stay (HOSPITAL_COMMUNITY)
Admission: EM | Admit: 2022-06-30 | Discharge: 2022-07-08 | DRG: 683 | Disposition: A | Discharge status: Hospice | Payer: Medicare Other | Attending: Internal Medicine | Admitting: Internal Medicine

## 2022-06-30 DIAGNOSIS — S0990XA Unspecified injury of head, initial encounter: Secondary | ICD-10-CM | POA: Diagnosis not present

## 2022-06-30 DIAGNOSIS — R531 Weakness: Secondary | ICD-10-CM | POA: Diagnosis not present

## 2022-06-30 DIAGNOSIS — E861 Hypovolemia: Secondary | ICD-10-CM | POA: Diagnosis present

## 2022-06-30 DIAGNOSIS — N179 Acute kidney failure, unspecified: Principal | ICD-10-CM | POA: Diagnosis present

## 2022-06-30 DIAGNOSIS — Z681 Body mass index (BMI) 19 or less, adult: Secondary | ICD-10-CM

## 2022-06-30 DIAGNOSIS — R6510 Systemic inflammatory response syndrome (SIRS) of non-infectious origin without acute organ dysfunction: Secondary | ICD-10-CM | POA: Diagnosis present

## 2022-06-30 DIAGNOSIS — W06XXXA Fall from bed, initial encounter: Secondary | ICD-10-CM | POA: Diagnosis not present

## 2022-06-30 DIAGNOSIS — R102 Pelvic and perineal pain: Secondary | ICD-10-CM | POA: Diagnosis not present

## 2022-06-30 DIAGNOSIS — Z515 Encounter for palliative care: Secondary | ICD-10-CM | POA: Diagnosis not present

## 2022-06-30 DIAGNOSIS — E876 Hypokalemia: Secondary | ICD-10-CM | POA: Diagnosis not present

## 2022-06-30 DIAGNOSIS — Z833 Family history of diabetes mellitus: Secondary | ICD-10-CM

## 2022-06-30 DIAGNOSIS — A419 Sepsis, unspecified organism: Secondary | ICD-10-CM | POA: Diagnosis not present

## 2022-06-30 DIAGNOSIS — Z800 Family history of malignant neoplasm of digestive organs: Secondary | ICD-10-CM

## 2022-06-30 DIAGNOSIS — F319 Bipolar disorder, unspecified: Secondary | ICD-10-CM | POA: Diagnosis present

## 2022-06-30 DIAGNOSIS — S0181XA Laceration without foreign body of other part of head, initial encounter: Secondary | ICD-10-CM | POA: Diagnosis present

## 2022-06-30 DIAGNOSIS — S21112A Laceration without foreign body of left front wall of thorax without penetration into thoracic cavity, initial encounter: Secondary | ICD-10-CM | POA: Diagnosis present

## 2022-06-30 DIAGNOSIS — Z7401 Bed confinement status: Secondary | ICD-10-CM

## 2022-06-30 DIAGNOSIS — E860 Dehydration: Secondary | ICD-10-CM | POA: Diagnosis not present

## 2022-06-30 DIAGNOSIS — T68XXXA Hypothermia, initial encounter: Secondary | ICD-10-CM | POA: Diagnosis not present

## 2022-06-30 DIAGNOSIS — E872 Acidosis, unspecified: Secondary | ICD-10-CM | POA: Diagnosis present

## 2022-06-30 DIAGNOSIS — Y92003 Bedroom of unspecified non-institutional (private) residence as the place of occurrence of the external cause: Secondary | ICD-10-CM | POA: Diagnosis not present

## 2022-06-30 DIAGNOSIS — E039 Hypothyroidism, unspecified: Secondary | ICD-10-CM | POA: Diagnosis present

## 2022-06-30 DIAGNOSIS — Z8614 Personal history of Methicillin resistant Staphylococcus aureus infection: Secondary | ICD-10-CM

## 2022-06-30 DIAGNOSIS — I9589 Other hypotension: Secondary | ICD-10-CM | POA: Diagnosis not present

## 2022-06-30 DIAGNOSIS — T07XXXA Unspecified multiple injuries, initial encounter: Secondary | ICD-10-CM

## 2022-06-30 DIAGNOSIS — R000 Tachycardia, unspecified: Secondary | ICD-10-CM | POA: Diagnosis not present

## 2022-06-30 DIAGNOSIS — Z820 Family history of epilepsy and other diseases of the nervous system: Secondary | ICD-10-CM

## 2022-06-30 DIAGNOSIS — F0390 Unspecified dementia without behavioral disturbance: Secondary | ICD-10-CM | POA: Diagnosis not present

## 2022-06-30 DIAGNOSIS — E785 Hyperlipidemia, unspecified: Secondary | ICD-10-CM | POA: Diagnosis present

## 2022-06-30 DIAGNOSIS — S79919A Unspecified injury of unspecified hip, initial encounter: Secondary | ICD-10-CM | POA: Diagnosis not present

## 2022-06-30 DIAGNOSIS — N1832 Chronic kidney disease, stage 3b: Secondary | ICD-10-CM

## 2022-06-30 DIAGNOSIS — F03C0 Unspecified dementia, severe, without behavioral disturbance, psychotic disturbance, mood disturbance, and anxiety: Secondary | ICD-10-CM | POA: Diagnosis not present

## 2022-06-30 DIAGNOSIS — Z91199 Patient's noncompliance with other medical treatment and regimen due to unspecified reason: Secondary | ICD-10-CM

## 2022-06-30 DIAGNOSIS — Z66 Do not resuscitate: Secondary | ICD-10-CM | POA: Diagnosis not present

## 2022-06-30 DIAGNOSIS — Z7189 Other specified counseling: Secondary | ICD-10-CM | POA: Diagnosis not present

## 2022-06-30 DIAGNOSIS — I5032 Chronic diastolic (congestive) heart failure: Secondary | ICD-10-CM | POA: Diagnosis present

## 2022-06-30 DIAGNOSIS — Z170 Estrogen receptor positive status [ER+]: Secondary | ICD-10-CM

## 2022-06-30 DIAGNOSIS — Z803 Family history of malignant neoplasm of breast: Secondary | ICD-10-CM

## 2022-06-30 DIAGNOSIS — R627 Adult failure to thrive: Secondary | ICD-10-CM | POA: Diagnosis present

## 2022-06-30 DIAGNOSIS — N178 Other acute kidney failure: Secondary | ICD-10-CM | POA: Diagnosis not present

## 2022-06-30 DIAGNOSIS — S0093XA Contusion of unspecified part of head, initial encounter: Secondary | ICD-10-CM

## 2022-06-30 DIAGNOSIS — Z23 Encounter for immunization: Secondary | ICD-10-CM

## 2022-06-30 DIAGNOSIS — E1122 Type 2 diabetes mellitus with diabetic chronic kidney disease: Secondary | ICD-10-CM | POA: Diagnosis not present

## 2022-06-30 DIAGNOSIS — Z79890 Hormone replacement therapy (postmenopausal): Secondary | ICD-10-CM

## 2022-06-30 DIAGNOSIS — I959 Hypotension, unspecified: Secondary | ICD-10-CM | POA: Diagnosis present

## 2022-06-30 DIAGNOSIS — R0902 Hypoxemia: Secondary | ICD-10-CM | POA: Diagnosis not present

## 2022-06-30 DIAGNOSIS — N1831 Chronic kidney disease, stage 3a: Secondary | ICD-10-CM | POA: Diagnosis not present

## 2022-06-30 DIAGNOSIS — F0393 Unspecified dementia, unspecified severity, with mood disturbance: Secondary | ICD-10-CM | POA: Diagnosis present

## 2022-06-30 DIAGNOSIS — Z79899 Other long term (current) drug therapy: Secondary | ICD-10-CM

## 2022-06-30 DIAGNOSIS — M8580 Other specified disorders of bone density and structure, unspecified site: Secondary | ICD-10-CM | POA: Diagnosis present

## 2022-06-30 DIAGNOSIS — F05 Delirium due to known physiological condition: Secondary | ICD-10-CM | POA: Diagnosis not present

## 2022-06-30 DIAGNOSIS — Z1152 Encounter for screening for COVID-19: Secondary | ICD-10-CM | POA: Diagnosis not present

## 2022-06-30 DIAGNOSIS — E119 Type 2 diabetes mellitus without complications: Secondary | ICD-10-CM

## 2022-06-30 DIAGNOSIS — R739 Hyperglycemia, unspecified: Secondary | ICD-10-CM | POA: Diagnosis not present

## 2022-06-30 DIAGNOSIS — C50211 Malignant neoplasm of upper-inner quadrant of right female breast: Secondary | ICD-10-CM

## 2022-06-30 DIAGNOSIS — S199XXA Unspecified injury of neck, initial encounter: Secondary | ICD-10-CM | POA: Diagnosis not present

## 2022-06-30 DIAGNOSIS — R9431 Abnormal electrocardiogram [ECG] [EKG]: Secondary | ICD-10-CM | POA: Diagnosis present

## 2022-06-30 DIAGNOSIS — Z79811 Long term (current) use of aromatase inhibitors: Secondary | ICD-10-CM

## 2022-06-30 LAB — CBC WITH DIFFERENTIAL/PLATELET
Abs Immature Granulocytes: 0.15 10*3/uL — ABNORMAL HIGH (ref 0.00–0.07)
Basophils Absolute: 0 10*3/uL (ref 0.0–0.1)
Basophils Relative: 0 %
Eosinophils Absolute: 0 10*3/uL (ref 0.0–0.5)
Eosinophils Relative: 0 %
HCT: 49.1 % — ABNORMAL HIGH (ref 36.0–46.0)
Hemoglobin: 15.8 g/dL — ABNORMAL HIGH (ref 12.0–15.0)
Immature Granulocytes: 1 %
Lymphocytes Relative: 3 %
Lymphs Abs: 0.4 10*3/uL — ABNORMAL LOW (ref 0.7–4.0)
MCH: 30 pg (ref 26.0–34.0)
MCHC: 32.2 g/dL (ref 30.0–36.0)
MCV: 93.3 fL (ref 80.0–100.0)
Monocytes Absolute: 0.8 10*3/uL (ref 0.1–1.0)
Monocytes Relative: 5 %
Neutro Abs: 14.9 10*3/uL — ABNORMAL HIGH (ref 1.7–7.7)
Neutrophils Relative %: 91 %
Platelets: 223 10*3/uL (ref 150–400)
RBC: 5.26 MIL/uL — ABNORMAL HIGH (ref 3.87–5.11)
RDW: 14.3 % (ref 11.5–15.5)
WBC: 16.3 10*3/uL — ABNORMAL HIGH (ref 4.0–10.5)
nRBC: 0 % (ref 0.0–0.2)

## 2022-06-30 LAB — COMPREHENSIVE METABOLIC PANEL
ALT: UNDETERMINED U/L (ref 0–44)
ALT: 21 U/L (ref 0–44)
AST: 39 U/L (ref 15–41)
AST: 32 U/L (ref 15–41)
Albumin: 3.1 g/dL — ABNORMAL LOW (ref 3.5–5.0)
Albumin: 3.3 g/dL — ABNORMAL LOW (ref 3.5–5.0)
Alkaline Phosphatase: 70 U/L (ref 38–126)
Alkaline Phosphatase: 81 U/L (ref 38–126)
Anion gap: 23 — ABNORMAL HIGH (ref 5–15)
Anion gap: 20 — ABNORMAL HIGH (ref 5–15)
BUN: 69 mg/dL — ABNORMAL HIGH (ref 8–23)
BUN: 64 mg/dL — ABNORMAL HIGH (ref 8–23)
CO2: 12 mmol/L — ABNORMAL LOW (ref 22–32)
CO2: 22 mmol/L (ref 22–32)
Calcium: 9.1 mg/dL (ref 8.9–10.3)
Calcium: 9.5 mg/dL (ref 8.9–10.3)
Chloride: 105 mmol/L (ref 98–111)
Chloride: 100 mmol/L (ref 98–111)
Creatinine, Ser: 2.62 mg/dL — ABNORMAL HIGH (ref 0.44–1.00)
Creatinine, Ser: 2.63 mg/dL — ABNORMAL HIGH (ref 0.44–1.00)
GFR, Estimated: 18 mL/min — ABNORMAL LOW (ref >60)
GFR, Estimated: 18 mL/min — ABNORMAL LOW (ref >60)
Glucose, Bld: 217 mg/dL — ABNORMAL HIGH (ref 70–99)
Glucose, Bld: 197 mg/dL — ABNORMAL HIGH (ref 70–99)
Potassium: 4.7 mmol/L (ref 3.5–5.1)
Potassium: 4.7 mmol/L (ref 3.5–5.1)
Sodium: 140 mmol/L (ref 135–145)
Sodium: 142 mmol/L (ref 135–145)
Total Bilirubin: UNDETERMINED mg/dL (ref 0.3–1.2)
Total Bilirubin: 1.3 mg/dL — ABNORMAL HIGH (ref 0.3–1.2)
Total Protein: 6 g/dL — ABNORMAL LOW (ref 6.5–8.1)
Total Protein: 6.5 g/dL (ref 6.5–8.1)

## 2022-06-30 LAB — I-STAT CHEM 8, ED
BUN: 59 mg/dL — ABNORMAL HIGH (ref 8–23)
Calcium, Ion: 1.11 mmol/L — ABNORMAL LOW (ref 1.15–1.40)
Chloride: 103 mmol/L (ref 98–111)
Creatinine, Ser: 2.7 mg/dL — ABNORMAL HIGH (ref 0.44–1.00)
Glucose, Bld: 189 mg/dL — ABNORMAL HIGH (ref 70–99)
HCT: 47 % — ABNORMAL HIGH (ref 36.0–46.0)
Hemoglobin: 16 g/dL — ABNORMAL HIGH (ref 12.0–15.0)
Potassium: 4.4 mmol/L (ref 3.5–5.1)
Sodium: 142 mmol/L (ref 135–145)
TCO2: 24 mmol/L (ref 22–32)

## 2022-06-30 LAB — TSH: TSH: 4.633 u[IU]/mL — ABNORMAL HIGH (ref 0.350–4.500)

## 2022-06-30 LAB — CBG MONITORING, ED
Glucose-Capillary: 237 mg/dL — ABNORMAL HIGH (ref 70–99)
Glucose-Capillary: 192 mg/dL — ABNORMAL HIGH (ref 70–99)
Glucose-Capillary: 130 mg/dL — ABNORMAL HIGH (ref 70–99)

## 2022-06-30 LAB — LACTIC ACID, PLASMA
Lactic Acid, Venous: 5.9 mmol/L (ref 0.5–1.9)
Lactic Acid, Venous: 4.5 mmol/L (ref 0.5–1.9)

## 2022-06-30 LAB — CORTISOL: Cortisol, Plasma: >100 ug/dL

## 2022-06-30 LAB — PROCALCITONIN: Procalcitonin: 1.6 ng/mL

## 2022-06-30 MED ORDER — HEPARIN SODIUM (PORCINE) 5000 UNIT/ML IJ SOLN
5000.0000 [IU] | Freq: Three times a day (TID) | INTRAMUSCULAR | Status: DC
  Administered 2022-07-01 – 2022-07-02 (×6): 5000 [IU] via SUBCUTANEOUS
  Filled 2022-06-30 (×6): qty 1

## 2022-06-30 MED ORDER — INSULIN ASPART 100 UNIT/ML IJ SOLN: 0.0000 [IU] | Freq: Every day | INTRAMUSCULAR | Status: DC

## 2022-06-30 MED ORDER — LACTATED RINGERS IV SOLN: INTRAVENOUS | Status: DC

## 2022-06-30 MED ORDER — TETANUS-DIPHTH-ACELL PERTUSSIS 5-2.5-18.5 LF-MCG/0.5 IM SUSY
0.5000 mL | PREFILLED_SYRINGE | Freq: Once | INTRAMUSCULAR | Status: AC
  Administered 2022-07-01: 0.5 mL via INTRAMUSCULAR
  Filled 2022-06-30: qty 0.5

## 2022-06-30 MED ORDER — VANCOMYCIN HCL 1250 MG/250ML IV SOLN
1250.0000 mg | Freq: Once | INTRAVENOUS | Status: AC
  Administered 2022-06-30: 1250 mg via INTRAVENOUS
  Filled 2022-06-30: qty 250

## 2022-06-30 MED ORDER — LACTATED RINGERS IV BOLUS (SEPSIS)
1000.0000 mL | Freq: Once | INTRAVENOUS | Status: AC
  Administered 2022-06-30: 1000 mL via INTRAVENOUS

## 2022-06-30 MED ORDER — VANCOMYCIN VARIABLE DOSE PER UNSTABLE RENAL FUNCTION (PHARMACIST DOSING): Status: DC

## 2022-06-30 MED ORDER — SODIUM CHLORIDE 0.9 % IV SOLN: 2.0000 g | INTRAVENOUS | Status: DC

## 2022-06-30 MED ORDER — ACETAMINOPHEN 650 MG RE SUPP: 650.0000 mg | Freq: Four times a day (QID) | RECTAL | Status: DC | PRN

## 2022-06-30 MED ORDER — ACETAMINOPHEN 325 MG PO TABS: 650.0000 mg | ORAL_TABLET | Freq: Four times a day (QID) | ORAL | Status: DC | PRN

## 2022-06-30 MED ORDER — ONDANSETRON HCL 4 MG/2ML IJ SOLN
INTRAMUSCULAR | Status: AC
  Filled 2022-06-30: qty 2

## 2022-06-30 MED ORDER — INSULIN ASPART 100 UNIT/ML IJ SOLN
0.0000 [IU] | Freq: Three times a day (TID) | INTRAMUSCULAR | Status: DC
  Administered 2022-07-01 – 2022-07-02 (×2): 1 [IU] via SUBCUTANEOUS

## 2022-06-30 MED ORDER — VANCOMYCIN HCL IN DEXTROSE 1-5 GM/200ML-% IV SOLN: 1000.0000 mg | Freq: Once | INTRAVENOUS | Status: DC

## 2022-06-30 MED ORDER — SODIUM CHLORIDE 0.9 % IV SOLN
2.0000 g | Freq: Once | INTRAVENOUS | Status: AC
  Administered 2022-06-30: 2 g via INTRAVENOUS
  Filled 2022-06-30: qty 12.5

## 2022-06-30 MED ORDER — METRONIDAZOLE 500 MG/100ML IV SOLN
500.0000 mg | Freq: Once | INTRAVENOUS | Status: AC
  Administered 2022-07-01: 500 mg via INTRAVENOUS
  Filled 2022-06-30: qty 100

## 2022-06-30 MED ORDER — SODIUM CHLORIDE 0.9% FLUSH
3.0000 mL | Freq: Two times a day (BID) | INTRAVENOUS | Status: DC
  Administered 2022-06-30 – 2022-07-01 (×3): 3 mL via INTRAVENOUS

## 2022-06-30 MED ORDER — METRONIDAZOLE 500 MG/100ML IV SOLN
500.0000 mg | Freq: Two times a day (BID) | INTRAVENOUS | Status: DC
  Administered 2022-07-01: 500 mg via INTRAVENOUS
  Filled 2022-06-30: qty 100

## 2022-06-30 MED ORDER — STERILE WATER FOR INJECTION IV SOLN
INTRAVENOUS | Status: DC
  Filled 2022-06-30: qty 1000

## 2022-06-30 NOTE — ED Provider Notes (Signed)
[place] [facility]ENCY DEPARTMENT AT [facility] Provider Note   CSN: [id number] Arrival date & time: 06/30/22  1703     History  Chief Complaint  Patient presents with   [name] [name] is a 77 y.o. female.  Pt with hx advanced dementia, bed bound, had fall out of bed today. Skin tears to face/head and chest wall. No loc noted. Family assisted back to bed. Family reports mental status at baseline post fall. No anticoagulant use. EMS notes pt felt cool to touch, and had harm time finding bp, was ~ 100 manually. CBG in mid 200s. Pt limited historian - level 5 caveat. No report of recent fevers or other acute illness.   The history is provided by the patient, medical records and the EMS personnel. The history is limited by the condition of the patient.  Fall       Home Medications Prior to Admission medications   Medication Sig Start Date End Date Taking? Authorizing Provider  acetaminophen (TYLENOL) 325 MG tablet Take 2 tablets (650 mg total) by mouth every 6 (six) hours as needed for mild pain (or Fever >/= 101). Patient not taking: Reported on 05/19/2021 04/03/21   Meredeth Ide, MD  anastrozole (ARIMIDEX) 1 MG tablet Take 1 tablet (1 mg total) by mouth daily. 08/06/20   Magrinat, Valentino Hue, MD  carbamazepine (TEGRETOL XR) 200 MG 12 hr tablet Take 200 mg by mouth at bedtime.    [provider]  cefadroxil (DURICEF) 1 g tablet Take 1 tablet (1 g total) by mouth 2 (two) times daily. 05/20/21   Kuppelweiser, Cassie L, RPH-CPP  Coenzyme Q10 150 MG CAPS Take 300 mg by mouth at bedtime.    [provider]  feeding supplement, GLUCERNA SHAKE, (GLUCERNA SHAKE) LIQD Take 237 mLs by mouth 3 (three) times daily between meals. 04/03/21   Meredeth Ide, MD  furosemide (LASIX) 20 MG tablet Take 1 tablet (20 mg total) by mouth daily. 04/04/21   Meredeth Ide, MD  Heparin Sod, Pork, Lock Flush (HEPARIN LOCK FLUSH IV) Inject 10-500 Units into the vein See admin  instructions. Heparin 100u/ml - 500units (5ml) for implanted ports and Heparin 10u/ml - 50units (5ml) for all other central venous catheters - every 8 hours. Additionally -  Heparin 10u/ml - 10 units intravenously every 8 hours.    [provider]  hydrocortisone (ANUSOL-HC) 2.5 % rectal cream Place 1 application rectally 2 (two) times daily. 12/02/20   Unk Lightning, PA  lactulose (CHRONULAC) 10 GM/15ML solution Take 15 mLs (10 g total) by mouth 2 (two) times daily as needed for mild constipation. Patient taking differently: Take 10 g by mouth 2 (two) times daily. 04/03/21   Meredeth Ide, MD  levothyroxine (SYNTHROID) 88 MCG tablet Take 88 mcg by mouth daily before breakfast.    [provider]  Magnesium 400 MG TABS Take 400 mg by mouth daily.    [provider]  Omega-3 Fatty Acids (FISH OIL) 1200 MG CAPS Take 1,200 mg by mouth daily at 6 (six) AM.    [provider]  oxyCODONE (OXY IR/ROXICODONE) 5 MG immediate release tablet Take 1 tablet (5 mg total) by mouth every 6 (six) hours as needed for severe pain. Patient not taking: Reported on 04/28/2021 04/03/21   Meredeth Ide, MD  rosuvastatin (CRESTOR) 5 MG tablet Take 1 tablet (5 mg total) by mouth daily. 01/01/21   Christell Constant, MD  senna-docusate (  SENOKOT-S) 8.6-50 MG tablet Take 1 tablet by mouth 2 (two) times daily. 04/03/21   Meredeth Ide, MD  sodium chloride 0.9 % injection Inject 10 mLs into the vein every 8 (eight) hours.    [provider]      Allergies    Patient has no known allergies.    Review of Systems   Review of Systems  Unable to perform ROS: Dementia    Physical Exam Updated Vital Signs BP (!) 79/54   Pulse 98   Temp (!) 95.3 F (35.2 C) (Rectal)   Resp 15   Ht 1.676 m (5\' 6" )   Wt 56.7 kg   SpO2 98%   BMI 20.18 kg/m  Physical Exam Vitals and nursing note reviewed.  Constitutional:      Appearance: Normal appearance. She is well-developed.   HENT:     Head:     Comments: Superficial skin tear to face.     Nose: Nose normal.     Mouth/Throat:     Mouth: Mucous membranes are moist.  Eyes:     General: No scleral icterus.    Conjunctiva/sclera: Conjunctivae normal.     Pupils: Pupils are equal, round, and reactive to light.  Neck:     Vascular: No carotid bruit.     Trachea: No tracheal deviation.     Comments: No stiffness or rigidity Cardiovascular:     Rate and Rhythm: Normal rate and regular rhythm.     Pulses: Normal pulses.     Heart sounds: Normal heart sounds. No murmur heard.    No friction rub. No gallop.  Pulmonary:     Effort: Pulmonary effort is normal. No respiratory distress.     Breath sounds: Normal breath sounds.  Abdominal:     General: Bowel sounds are normal. There is no distension.     Palpations: Abdomen is soft. There is no mass.     Tenderness: There is no abdominal tenderness. There is no guarding.  Genitourinary:    Comments: No cva tenderness.  Musculoskeletal:        General: No swelling.     Cervical back: Normal range of motion and neck supple. No rigidity or tenderness. No muscular tenderness.     Comments: CTLS spine, non tender, aligned, no step off. Pt moves bilateral extremities purposefully. No gross deformity, pain w rom, or focal bony tenderness noted.   Skin:    General: Skin is warm and dry.     Findings: No rash.  Neurological:     Mental Status: She is alert.     Comments: Awake and alert. Makes purposeful movements bil. Does not follow commands. Makes verbalizations. Mental state and functional ability reported at baseline for patient per EMS.   Psychiatric:        Mood and Affect: Mood normal.     ED Results / Procedures / Treatments   Labs (all labs ordered are listed, but only abnormal results are displayed) Results for orders placed or performed during the hospital encounter of 06/30/22  Comprehensive metabolic panel  Result Value Ref Range   Sodium 140 135  - 145 mmol/L   Potassium 4.7 3.5 - 5.1 mmol/L   Chloride 105 98 - 111 mmol/L   CO2 12 (L) 22 - 32 mmol/L   Glucose, Bld 217 (H) 70 - 99 mg/dL   BUN 69 (H) 8 - 23 mg/dL   Creatinine, Ser 5.40 (H) 0.44 - 1.00 mg/dL   Calcium 9.1 8.9 -  10.3 mg/dL   Total Protein 6.0 (L) 6.5 - 8.1 g/dL   Albumin 3.1 (L) 3.5 - 5.0 g/dL   AST 39 15 - 41 U/L   ALT QUANTITY NOT SUFFICIENT, UNABLE TO PERFORM TEST 0 - 44 U/L   Alkaline Phosphatase 70 38 - 126 U/L   Total Bilirubin QUANTITY NOT SUFFICIENT, UNABLE TO PERFORM TEST 0.3 - 1.2 mg/dL   GFR, Estimated 18 (L) >60 mL/min   Anion gap 23 (H) 5 - 15  CBG monitoring, ED  Result Value Ref Range   Glucose-Capillary 237 (H) 70 - 99 mg/dL  I-stat chem 8, ED  Result Value Ref Range   Sodium 142 135 - 145 mmol/L   Potassium 4.4 3.5 - 5.1 mmol/L   Chloride 103 98 - 111 mmol/L   BUN 59 (H) 8 - 23 mg/dL   Creatinine, Ser 1.61 (H) 0.44 - 1.00 mg/dL   Glucose, Bld 096 (H) 70 - 99 mg/dL   Calcium, Ion 0.45 (L) 1.15 - 1.40 mmol/L   TCO2 24 22 - 32 mmol/L   Hemoglobin 16.0 (H) 12.0 - 15.0 g/dL   HCT 40.9 (H) 81.1 - 91.4 %  CBG monitoring, ED  Result Value Ref Range   Glucose-Capillary 192 (H) 70 - 99 mg/dL      EKG EKG Interpretation  Date/Time:  Wednesday Jun 30 2022 17:10:11 EDT Ventricular Rate:  98 PR Interval:  128 QRS Duration: 119 QT Interval:  418 QTC Calculation: 534 R Axis:   -75 Text Interpretation: Sinus rhythm Incomplete RBBB and LAFB Low voltage, precordial leads Non-specific ST-t changes Baseline wander Poor data quality Confirmed by Cathren Laine ([id number]) on 06/30/2022 5:30:24 PM  Radiology DG Pelvis Portable  Result Date: 06/30/2022 CLINICAL DATA:  Pain after fall EXAM: PORTABLE PELVIS 1-2 VIEWS COMPARISON:  None Available. FINDINGS: Patient is rotated to the right. Severe osteopenia. No dislocation. Slight deformity of the superior ramus of the left pubic bone. Preserved joint spaces. There are some scattered vascular calcifications.  With this level of severe osteopenia a subtle nondisplaced injury is difficult to completely exclude and if needed further workup with CT or MRI as clinically appropriate. IMPRESSION: Severe osteopenia. Slight deformity of the left superior pubic ramus. A subtle fracture is possible. Additional cross-sectional evaluation as clinically appropriate Electronically Signed   By: Karen Kays M.D.   On: 06/30/2022 18:23   DG Chest Port 1 View  Result Date: 06/30/2022 CLINICAL DATA:  Sepsis.  Fall EXAM: PORTABLE CHEST 1 VIEW COMPARISON:  X-ray 03/08/2021 and CT scan FINDINGS: Patient is rotated to the left. The left inferior costophrenic angle is clipped off the edge of the film. Overlapping cardiac leads. Otherwise no consolidation, pneumothorax, effusion or edema. Normal cardiopericardial silhouette. Calcified aorta. Curvature and degenerative changes of the spine. Osteopenia. IMPRESSION: Grossly no acute cardiopulmonary disease.  Rotated radiograph. Electronically Signed   By: Karen Kays M.D.   On: 06/30/2022 18:19   CT Head Wo Contrast  Result Date: 06/30/2022 CLINICAL DATA:  Level 1 trauma EXAM: CT HEAD WITHOUT CONTRAST CT CERVICAL SPINE WITHOUT CONTRAST TECHNIQUE: Multidetector CT imaging of the head and cervical spine was performed following the standard protocol without intravenous contrast. Multiplanar CT image reconstructions of the cervical spine were also generated. RADIATION DOSE REDUCTION: This exam was performed according to the departmental dose-optimization program which includes automated exposure control, adjustment of the mA and/or kV according to patient size and/or use of iterative reconstruction technique. COMPARISON:  CT head and cervical spine 03/08/2021  FINDINGS: CT HEAD FINDINGS Brain: No evidence of acute infarct, hemorrhage, mass, mass effect, or midline shift. No hydrocephalus or extra-axial fluid collection. Vascular: No hyperdense vessel. Skull: Negative for fracture or focal  lesion. Sinuses/Orbits: No acute finding. Other: The mastoid air cells are well aerated. CT CERVICAL SPINE FINDINGS Alignment: No traumatic listhesis. Reversal of the normal cervical lordosis with redemonstrated mild anterolisthesis of C3 on C4, trace retrolisthesis of C5 on C6, and trace anterolisthesis of C7 on T1. Skull base and vertebrae: No acute fracture or suspicious osseous lesion. Soft tissues and spinal canal: No prevertebral fluid or swelling. No visible canal hematoma. Disc levels: Degenerative changes in the cervical spine. Moderate spinal canal stenosis at C5-C6. Multilevel facet and uncovertebral hypertrophy. Upper chest: Negative. IMPRESSION: 1. No acute intracranial process. 2. No acute fracture or traumatic listhesis in the cervical spine. These results were called by telephone at the time of interpretation on 06/30/2022 at 6:12 pm to provider [facility], who verbally acknowledged these results. Electronically Signed   By: Wiliam Ke M.D.   On: 06/30/2022 18:13   CT Cervical Spine Wo Contrast  Result Date: 06/30/2022 CLINICAL DATA:  Level 1 trauma EXAM: CT HEAD WITHOUT CONTRAST CT CERVICAL SPINE WITHOUT CONTRAST TECHNIQUE: Multidetector CT imaging of the head and cervical spine was performed following the standard protocol without intravenous contrast. Multiplanar CT image reconstructions of the cervical spine were also generated. RADIATION DOSE REDUCTION: This exam was performed according to the departmental dose-optimization program which includes automated exposure control, adjustment of the mA and/or kV according to patient size and/or use of iterative reconstruction technique. COMPARISON:  CT head and cervical spine 03/08/2021 FINDINGS: CT HEAD FINDINGS Brain: No evidence of acute infarct, hemorrhage, mass, mass effect, or midline shift. No hydrocephalus or extra-axial fluid collection. Vascular: No hyperdense vessel. Skull: Negative for fracture or focal lesion. Sinuses/Orbits: No acute  finding. Other: The mastoid air cells are well aerated. CT CERVICAL SPINE FINDINGS Alignment: No traumatic listhesis. Reversal of the normal cervical lordosis with redemonstrated mild anterolisthesis of C3 on C4, trace retrolisthesis of C5 on C6, and trace anterolisthesis of C7 on T1. Skull base and vertebrae: No acute fracture or suspicious osseous lesion. Soft tissues and spinal canal: No prevertebral fluid or swelling. No visible canal hematoma. Disc levels: Degenerative changes in the cervical spine. Moderate spinal canal stenosis at C5-C6. Multilevel facet and uncovertebral hypertrophy. Upper chest: Negative. IMPRESSION: 1. No acute intracranial process. 2. No acute fracture or traumatic listhesis in the cervical spine. These results were called by telephone at the time of interpretation on 06/30/2022 at 6:12 pm to provider [facility], who verbally acknowledged these results. Electronically Signed   By: Wiliam Ke M.D.   On: 06/30/2022 18:13    Procedures Procedures    Medications Ordered in ED Medications  lactated ringers infusion (has no administration in time range)  metroNIDAZOLE (FLAGYL) IVPB 500 mg (has no administration in time range)  lactated ringers bolus 1,000 mL (0 mLs Intravenous Stopped 06/30/22 1822)    And  lactated ringers bolus 1,000 mL (1,000 mLs Intravenous New Bag/Given 06/30/22 2100)  vancomycin (VANCOREADY) IVPB 1250 mg/250 mL (1,250 mg Intravenous New Bag/Given 06/30/22 2107)  ceFEPIme (MAXIPIME) 2 g in sodium chloride 0.9 % 100 mL IVPB (has no administration in time range)  vancomycin variable dose per unstable renal function (pharmacist dosing) (has no administration in time range)  ceFEPIme (MAXIPIME) 2 g in sodium chloride 0.9 % 100 mL IVPB (2 g Intravenous New Bag/Given 06/30/22 1746)  ED Course/ Medical Decision Making/ A&P                             Medical Decision Making Problems Addressed: AKI (acute kidney injury) [facility]): acute illness or injury with  systemic symptoms that poses a threat to life or bodily functions Chronic dementia (HCC): chronic illness or injury with exacerbation, progression, or side effects of treatment that poses a threat to life or bodily functions Contusion of head, initial encounter: acute illness or injury with systemic symptoms that poses a threat to life or bodily functions Dehydration: acute illness or injury with systemic symptoms that poses a threat to life or bodily functions Fall from bed, initial encounter: acute illness or injury with systemic symptoms that poses a threat to life or bodily functions Generalized weakness: acute illness or injury with systemic symptoms that poses a threat to life or bodily functions Hypotension due to hypovolemia: acute illness or injury with systemic symptoms that poses a threat to life or bodily functions Multiple abrasions: acute illness or injury  Amount and/or Complexity of Data Reviewed Independent Historian: EMS    Details: Ems/family External Data Reviewed: labs and notes. Labs: ordered. Decision-making details documented in ED Course. Radiology: ordered and independent interpretation performed. Decision-making details documented in ED Course. ECG/medicine tests: ordered and independent interpretation performed. Decision-making details documented in ED Course. Discussion of management or test interpretation with external provider(s): Trauma, hospitalists,  Risk Prescription drug management. Decision regarding hospitalization.   Iv ns. Continuous pulse ox and cardiac monitoring. Labs ordered/sent. Imaging ordered.   Differential diagnosis includes dehydration, sepsis, uti, traumatic injury, etc. Dispo decision including potential need for admission considered - will get labs and imaging and reassess.   Reviewed nursing notes and prior charts for additional history. External reports reviewed. Additional history from: EMS.  As bp low s/p fall with head trauma,  nursing made level 1 trauma activation. Trauma surgery/Dr Bedelia Person evaluated pt at bedside.    Difficult to obtain bp - pt is conscious/alert, moving purposefully, and appears content/calm. ?sepsis vs hypovolemia. Cultures sent. Antibiotics given. LR boluses.   Cardiac monitor: sinus rhythm, rate 90.  Labs reviewed/interpreted by me - bun/cr elevated, c/w aki, dehydration,  LR boluses. Iv abx given.   Xrays reviewed/interpreted by me - no pna.   CT reviewed/interpreted by me - no hem.   Bp improved w fluids.   Dr Bedelia Person discussed pt w family, and family confirms dnr wishes, and also wishes for palliative/hospice care - palliative team consulted.   Multiple rechecks of patients - neuro checks not changed from initial.   Hospitalists consulted for admission.   CRITICAL CARE RE: hypotension, volume depletion, aki, r/o sepsis, fall on anticoag therapy, r/o head injury.  Performed by: Suzi Roots Total critical care time: 135 minutes Critical care time was exclusive of separately billable procedures and treating other patients. Critical care was necessary to treat or prevent imminent or life-threatening deterioration. Critical care was time spent personally by me on the following activities: development of treatment plan with patient and/or surrogate as well as nursing, discussions with consultants, evaluation of patient's response to treatment, examination of patient, obtaining history from patient or surrogate, ordering and performing treatments and interventions, ordering and review of laboratory studies, ordering and review of radiographic studies, pulse oximetry and re-evaluation of patient's condition.            Final Clinical Impression(s) / ED Diagnoses Final diagnoses:  None    Rx / DC Orders ED Discharge Orders     None         Cathren Laine, MD 06/30/22 2140

## 2022-06-30 NOTE — ED Notes (Signed)
IV access infiltrated. Attempted other access X2 by this RN along with 2nd RN without success.

## 2022-06-30 NOTE — Progress Notes (Signed)
Pharmacy Antibiotic Note  [name] [name] is a 77 y.o. female admitted on 06/30/2022 with sepsis.  Pharmacy has been consulted for vancomycin and cefepime dosing. Flagyl per MD.   Patient's serum creatinine is elevated significantly from baseline - up to 2.62 today, baseline closer to 1.2.-1.4.   Plan: Give Vancomycin 1250mg  IV x 1 Give cefepime 2g q24 hours - follow up to adjust based on renal function  Follow up to schedule maintenance vancomycin doses pending renal improvement  Follow up signs and symptoms of infection, renal function, culture results, and ability to narrow   Height: 5\' 6"  (167.6 cm) Weight: 56.7 kg (125 lb) IBW/kg (Calculated) : 59.3  Temp (24hrs), Avg:95.3 F (35.2 C), Min:95.3 F (35.2 C), Max:95.3 F (35.2 C)  No results for input(s): "WBC", "CREATININE", "LATICACIDVEN", "VANCOTROUGH", "VANCOPEAK", "VANCORANDOM", "GENTTROUGH", "GENTPEAK", "GENTRANDOM", "TOBRATROUGH", "TOBRAPEAK", "TOBRARND", "AMIKACINPEAK", "AMIKACINTROU", "AMIKACIN" in the last 168 hours.  CrCl cannot be calculated (Patient's most recent lab result is older than the maximum 21 days allowed.).    No Known Allergies   Microbiology results: 06/30/2022 BCx: sent 06/30/2022 UCx: sent   Thank you for allowing pharmacy to be a part of this patient's care.  Blane Ohara, PharmD  PGY1 Pharmacy Resident

## 2022-06-30 NOTE — Consult Note (Signed)
TRAUMA H&P  06/30/2022, 7:33 PM   Chief Complaint: Level 1 trauma activation for fall, hypotension  Primary Survey:  ABC's intact on arrival  The patient is an 77 y.o. female.   HPI: 17F s/p fall out of bed. Hypotensive in ED, activated as a level 1 trauma. Collateral history obtained from son is that he was at home when she fell out of bed and he heard her land on the hardwood floor. She described shortness of breath to him, prompting him to call 911.   Past Medical History:  Diagnosis Date   Anxiety    being weaned off lithium will be off on 06/23/11   Bipolar disorder (HCC)    Cancer (HCC) 07/2019   right breast IDC with DCIS   Diastolic heart failure (HCC)    Diverticulosis 09/01/2011   mild, left sided   Family history of breast cancer    Family history of colon cancer    Hyperlipidemia    Hypothyroidism    IBS (irritable bowel syndrome)    Leg cramps    Mood disorder (HCC)    Morbidly obese (HCC)    Numbness and tingling in left arm    Osteopenia    Prediabetes    Stage 3 chronic kidney disease (HCC)    CKD stg 3   Vitamin D deficiency     Past Surgical History:  Procedure Laterality Date   back biopsy     for skin   BREAST LUMPECTOMY WITH RADIOACTIVE SEED AND SENTINEL LYMPH NODE BIOPSY Right 08/29/2019   Procedure: RIGHT BREAST LUMPECTOMY WITH RADIOACTIVE SEED AND SENTINEL LYMPH NODE BIOPSY;  Surgeon: Harriette Bouillon, MD;  Location: [facility];  Service: General;  Laterality: Right;   COLONOSCOPY  06/09/2006   Dr.Orr   COLONOSCOPY  09/01/2011   DILATATION & CURETTAGE/HYSTEROSCOPY WITH TRUECLEAR N/A 11/12/2013   Procedure: RESECTOSCOPIC POLYPECTOMY Aileen Pilot, D&C;  Surgeon: Ok Edwards, MD;  Location: [facility];  Service: Gynecology;  Laterality: N/A;   ENDOMETRIAL BIOPSY  09/14/2013   Dr. Reynaldo Minium   RADIOLOGY WITH ANESTHESIA N/A 03/14/2021   Procedure: MRI [facility] AND LUMBAR WITH AND WITHOUT CONTRAST WITH ANESTHESIA;  Surgeon:  Radiologist, Medication, MD;  Location: [facility];  Service: Radiology;  Laterality: N/A;    No pertinent family history.  Social History:  reports that she has never smoked. She has never used smokeless tobacco. She reports that she does not drink alcohol and does not use drugs.  Allergies: No Known Allergies  Medications: reviewed  Results for orders placed or performed during the hospital encounter of 06/30/22 (from the past 48 hour(s))  CBG monitoring, ED     Status: Abnormal   Collection Time: 06/30/22  5:20 PM  Result Value Ref Range   Glucose-Capillary 237 (H) 70 - 99 mg/dL    Comment: Glucose reference range applies only to samples taken after fasting for at least 8 hours.  CBG monitoring, ED     Status: Abnormal   Collection Time: 06/30/22  5:48 PM  Result Value Ref Range   Glucose-Capillary 192 (H) 70 - 99 mg/dL    Comment: Glucose reference range applies only to samples taken after fasting for at least 8 hours.    DG Pelvis Portable  Result Date: 06/30/2022 CLINICAL DATA:  Pain after fall EXAM: PORTABLE PELVIS 1-2 VIEWS COMPARISON:  None Available. FINDINGS: Patient is rotated to the right. Severe osteopenia. No dislocation. Slight deformity of the superior ramus of the left pubic  bone. Preserved joint spaces. There are some scattered vascular calcifications. With this level of severe osteopenia a subtle nondisplaced injury is difficult to completely exclude and if needed further workup with CT or MRI as clinically appropriate. IMPRESSION: Severe osteopenia. Slight deformity of the left superior pubic ramus. A subtle fracture is possible. Additional cross-sectional evaluation as clinically appropriate Electronically Signed   By: Karen Kays M.D.   On: 06/30/2022 18:23   DG Chest Port 1 View  Result Date: 06/30/2022 CLINICAL DATA:  Sepsis.  Fall EXAM: PORTABLE CHEST 1 VIEW COMPARISON:  X-ray 03/08/2021 and CT scan FINDINGS: Patient is rotated to the left. The left inferior  costophrenic angle is clipped off the edge of the film. Overlapping cardiac leads. Otherwise no consolidation, pneumothorax, effusion or edema. Normal cardiopericardial silhouette. Calcified aorta. Curvature and degenerative changes of the spine. Osteopenia. IMPRESSION: Grossly no acute cardiopulmonary disease.  Rotated radiograph. Electronically Signed   By: Karen Kays M.D.   On: 06/30/2022 18:19   CT Head Wo Contrast  Result Date: 06/30/2022 CLINICAL DATA:  Level 1 trauma EXAM: CT HEAD WITHOUT CONTRAST CT CERVICAL SPINE WITHOUT CONTRAST TECHNIQUE: Multidetector CT imaging of the head and cervical spine was performed following the standard protocol without intravenous contrast. Multiplanar CT image reconstructions of the cervical spine were also generated. RADIATION DOSE REDUCTION: This exam was performed according to the departmental dose-optimization program which includes automated exposure control, adjustment of the mA and/or kV according to patient size and/or use of iterative reconstruction technique. COMPARISON:  CT head and cervical spine 03/08/2021 FINDINGS: CT HEAD FINDINGS Brain: No evidence of acute infarct, hemorrhage, mass, mass effect, or midline shift. No hydrocephalus or extra-axial fluid collection. Vascular: No hyperdense vessel. Skull: Negative for fracture or focal lesion. Sinuses/Orbits: No acute finding. Other: The mastoid air cells are well aerated. CT CERVICAL SPINE FINDINGS Alignment: No traumatic listhesis. Reversal of the normal cervical lordosis with redemonstrated mild anterolisthesis of C3 on C4, trace retrolisthesis of C5 on C6, and trace anterolisthesis of C7 on T1. Skull base and vertebrae: No acute fracture or suspicious osseous lesion. Soft tissues and spinal canal: No prevertebral fluid or swelling. No visible canal hematoma. Disc levels: Degenerative changes in the cervical spine. Moderate spinal canal stenosis at C5-C6. Multilevel facet and uncovertebral hypertrophy.  Upper chest: Negative. IMPRESSION: 1. No acute intracranial process. 2. No acute fracture or traumatic listhesis in the cervical spine. These results were called by telephone at the time of interpretation on 06/30/2022 at 6:12 pm to provider [facility], who verbally acknowledged these results. Electronically Signed   By: Wiliam Ke M.D.   On: 06/30/2022 18:13   CT Cervical Spine Wo Contrast  Result Date: 06/30/2022 CLINICAL DATA:  Level 1 trauma EXAM: CT HEAD WITHOUT CONTRAST CT CERVICAL SPINE WITHOUT CONTRAST TECHNIQUE: Multidetector CT imaging of the head and cervical spine was performed following the standard protocol without intravenous contrast. Multiplanar CT image reconstructions of the cervical spine were also generated. RADIATION DOSE REDUCTION: This exam was performed according to the departmental dose-optimization program which includes automated exposure control, adjustment of the mA and/or kV according to patient size and/or use of iterative reconstruction technique. COMPARISON:  CT head and cervical spine 03/08/2021 FINDINGS: CT HEAD FINDINGS Brain: No evidence of acute infarct, hemorrhage, mass, mass effect, or midline shift. No hydrocephalus or extra-axial fluid collection. Vascular: No hyperdense vessel. Skull: Negative for fracture or focal lesion. Sinuses/Orbits: No acute finding. Other: The mastoid air cells are well aerated. CT CERVICAL SPINE FINDINGS  Alignment: No traumatic listhesis. Reversal of the normal cervical lordosis with redemonstrated mild anterolisthesis of C3 on C4, trace retrolisthesis of C5 on C6, and trace anterolisthesis of C7 on T1. Skull base and vertebrae: No acute fracture or suspicious osseous lesion. Soft tissues and spinal canal: No prevertebral fluid or swelling. No visible canal hematoma. Disc levels: Degenerative changes in the cervical spine. Moderate spinal canal stenosis at C5-C6. Multilevel facet and uncovertebral hypertrophy. Upper chest: Negative. IMPRESSION:  1. No acute intracranial process. 2. No acute fracture or traumatic listhesis in the cervical spine. These results were called by telephone at the time of interpretation on 06/30/2022 at 6:12 pm to provider [facility], who verbally acknowledged these results. Electronically Signed   By: Wiliam Ke M.D.   On: 06/30/2022 18:13    ROS 10 point review of systems is negative except as listed above in HPI.  Blood pressure (!) 79/54, pulse 98, temperature (!) 95.3 F (35.2 C), temperature source Rectal, resp. rate 15, height 5\' 6"  (1.676 m), weight 56.7 kg, SpO2 98 %.  Secondary Survey:  GCS: E(4)//V(4)//M(5) Constitutional: well-developed, well-nourished Skull: normocephalic, atraumatic Eyes: pupils equal, round, reactive to light, 2mm b/l, moist conjunctiva Face/ENT: midface stable without deformity, poor  dentition, external inspection of ears and nose normal, hearing intact, abrasion to  Oropharynx: normal oropharyngeal mucosa, no blood   Neck: no thyromegaly, trachea midline, c-collar  not applied , no midline cervical tenderness to palpation, no C-spine stepoffs Chest: breath sounds equal bilaterally, normal  respiratory effort, no midline or lateral chest wall tenderness to palpation/deformity Abdomen: soft, NT, no bruising, no hepatosplenomegaly FAST: not performed Pelvis: stable GU: normal female genitalia Back: no wounds, unable to assess T/L spine TTP, no T/L spine stepoffs Rectal: deferred Extremities: 2+  radial and pedal pulses bilaterally, intact motor and sensation of bilateral UE and LE, no peripheral edema MSK: unable to assess gait/station, no clubbing/cyanosis of fingers/toes, normal ROM of all four extremities Skin: warm, dry, no rashes  CXR in TB: unremarkable Pelvis XR in TB: questionable fx of  superior L pubic ramus    Assessment/Plan: Problem List Fall  Plan Extensive discussions with the patient's son and daughter.  Initial conversation was with the patient's son  who reports that the patient lives with him and her husband who has end-stage Alzheimer's disease.  The patient's son reports that he is caretaker for the patient and the patient's husband.  He reports that he called 911 because the patient described difficulty breathing and he "had no oxygen to give her".  He expresses distrust and frustration with the medical system, stating that he did not want the patient to come to [facility], but there is no other hospital system to take her to.  I did explore resuscitation status with him and he reports that the patient is a DNR.  He provided the name and contact information of his sister.  He and I discussed that in the absence of the healthcare power of attorney and in light of the patient's spouse being unable to make healthcare decisions on her behalf, that he and his sister are joint decision makers for the patient.  He then expressed a desire for the patient not to undergo any imaging or other interventions.  I explained to him that I was required to discuss this with his sister as well.  He became upset at this and ended the conversation.  I attempted to reach his sister by phone and had to leave a voicemail.  In the absence of both decision-makers being available, I made the decision to proceed with CT imaging of the patient's head and cervical spine.  Both of these resulted with no identified traumatic injury.  I then received a phone call back from the patient's daughter.  He had an extensive conversation regarding the patient's current clinical state.  She describes that the patient has been declining over time and expresses concern that she is dying.  We discussed the differences between active dying process and in the stage of life that is dying.  I believe the patient is in the latter and communicated this to her.  I expressed to her my opinion that the patient would benefit from additional support in the form of facility placement, whether at skilled nursing facility  or in a hospice facility, or palliative care support in the form of home hospice.  Daughter explains extensive hospital and medical care needs that have been financially significant and also expresses that the patient's history of bipolar disorder and noncompliance with treatment options have resulted in significant difficulties in providing her care.  I explained the benefit of inpatient hospitalization for the purposes of identification of capacity as well as exploring hospice options.  Daughter and son have both expressed a preference for the patient to return back to her previous living situation with the son.  I do believe this would be an improved situation with outpatient palliative care support and that coordination of this could be possible as an outpatient but would likely be more cumbersome to arrange.  I believe that all parties would benefit from inpatient admission and coordinate coordination of care with palliative services for outpatient support.  Consult placed for both psychiatry and palliative care.  Daughter: Chaniyah Vandongen, [phone] Son: Lakrystal Rodden, [phone]  Critical care time:  Diamantina Monks, MD General and Trauma Surgery [facility]

## 2022-06-30 NOTE — Progress Notes (Signed)
Orthopedic Tech Progress Note Patient Details:  [name] [name] 11/02/1945 [id number] Level 1 Trauma  Patient ID: [name] [name], female   DOB: 03-03-1945, 77 y.o.   MRN: [id number]  Smitty Pluck 06/30/2022, 5:37 PM

## 2022-06-30 NOTE — ED Notes (Signed)
Pt meds given late due to no IV access. Meds now able to be given. Fluid boluses going to increase pressure. Responding well to treatment.

## 2022-06-30 NOTE — ED Notes (Signed)
Lab back at bedside attempting lab draw.

## 2022-06-30 NOTE — ED Notes (Signed)
Pt placed on bair hugger

## 2022-06-30 NOTE — H&P (Signed)
History and Physical    [name] [name] ZOX:[id number] DOB: [birth year] DOA: 06/30/2022  PCP: [facility]   Patient coming from: Home   Chief Complaint: Fall   HPI: [name] [name] is a 77 y.o. female with medical history significant for bipolar disorder, hypothyroidism, CKD 3A, breast cancer, chronic diastolic CHF, presenting to the emergency department after a fall at home.  Patient lives with her son who heard her fall out of bed today.  The patient indicated that she was having trouble breathing and this prompted EMS to be called.  Patient was found to have skin tears and facial trauma.  She is confused, intermittently agitated, and unable to meaningfully contribute to the history; her children indicate that this is her baseline.  She does not take any medications per report of her son.  ED Course: Upon arrival to the ED, patient is found to be hypothermic with rectal temp of 35.2 C, tachypnea, slightly tachycardic, and hypotensive.  Labs are most notable for BUN 69, creatinine 2.62, bicarbonate 12, anion gap 23, WBC 16,300, and lactic acid 5.9.  There were no acute findings on CT of the cervical spine and no acute intracranial abnormalities on head CT.  Chest x-ray is negative for acute cardiopulmonary disease.  There was a question of subtle left superior pubic ramus fracture on plain radiographs.  Blood and urine cultures ordered, 2 L of LR were administered, and the patient was treated with vancomycin, cefepime, Flagyl, and Tdap.  She was evaluated by trauma surgery in the ED.  Review of Systems:  All other systems reviewed and apart from HPI, are negative.  Past Medical History:  Diagnosis Date   Anxiety    being weaned off lithium will be off on 06/23/11   Bipolar disorder (HCC)    Cancer (HCC) 07/2019   right breast IDC with DCIS   Diastolic heart failure (HCC)    Diverticulosis 09/01/2011   mild, left sided   Family history of breast cancer    Family  history of colon cancer    Hyperlipidemia    Hypothyroidism    IBS (irritable bowel syndrome)    Leg cramps    Mood disorder (HCC)    Morbidly obese (HCC)    Numbness and tingling in left arm    Osteopenia    Prediabetes    Stage 3 chronic kidney disease (HCC)    CKD stg 3   Vitamin D deficiency     Past Surgical History:  Procedure Laterality Date   back biopsy     for skin   BREAST LUMPECTOMY WITH RADIOACTIVE SEED AND SENTINEL LYMPH NODE BIOPSY Right 08/29/2019   Procedure: RIGHT BREAST LUMPECTOMY WITH RADIOACTIVE SEED AND SENTINEL LYMPH NODE BIOPSY;  Surgeon: Harriette Bouillon, MD;  Location: [facility];  Service: General;  Laterality: Right;   COLONOSCOPY  06/09/2006   Dr.Orr   COLONOSCOPY  09/01/2011   DILATATION & CURETTAGE/HYSTEROSCOPY WITH TRUECLEAR N/A 11/12/2013   Procedure: RESECTOSCOPIC POLYPECTOMY Aileen Pilot, D&C;  Surgeon: Ok Edwards, MD;  Location: [facility];  Service: Gynecology;  Laterality: N/A;   ENDOMETRIAL BIOPSY  09/14/2013   Dr. Reynaldo Minium   RADIOLOGY WITH ANESTHESIA N/A 03/14/2021   Procedure: MRI [facility] AND LUMBAR WITH AND WITHOUT CONTRAST WITH ANESTHESIA;  Surgeon: Radiologist, Medication, MD;  Location: [facility];  Service: Radiology;  Laterality: N/A;    Social History:   reports that she has never smoked. She has never used smokeless tobacco. She reports that she  does not drink alcohol and does not use drugs.  No Known Allergies  Family History  Problem Relation Age of Onset   Diabetes Mother    Other Mother        meningioma (multiple)   Colon cancer Father 50   Cancer - Colon Father 52       died 58   Parkinson's disease Sister 61   Alzheimer's disease Maternal Grandfather    Other Daughter        tiny meningioma outside of the brain   Breast cancer Cousin 29       maternal cousin   Ovarian cancer Neg Hx    Colon polyps Neg Hx      Prior to Admission medications   Medication Sig Start Date End Date Taking?  Authorizing Provider  acetaminophen (TYLENOL) 325 MG tablet Take 2 tablets (650 mg total) by mouth every 6 (six) hours as needed for mild pain (or Fever >/= 101). Patient not taking: Reported on 05/19/2021 04/03/21   Meredeth Ide, MD  anastrozole (ARIMIDEX) 1 MG tablet Take 1 tablet (1 mg total) by mouth daily. 08/06/20   Magrinat, Valentino Hue, MD  carbamazepine (TEGRETOL XR) 200 MG 12 hr tablet Take 200 mg by mouth at bedtime.    [provider]  cefadroxil (DURICEF) 1 g tablet Take 1 tablet (1 g total) by mouth 2 (two) times daily. 05/20/21   Kuppelweiser, Cassie L, RPH-CPP  Coenzyme Q10 150 MG CAPS Take 300 mg by mouth at bedtime.    [provider]  feeding supplement, GLUCERNA SHAKE, (GLUCERNA SHAKE) LIQD Take 237 mLs by mouth 3 (three) times daily between meals. 04/03/21   Meredeth Ide, MD  furosemide (LASIX) 20 MG tablet Take 1 tablet (20 mg total) by mouth daily. 04/04/21   Meredeth Ide, MD  Heparin Sod, Pork, Lock Flush (HEPARIN LOCK FLUSH IV) Inject 10-500 Units into the vein See admin instructions. Heparin 100u/ml - 500units (5ml) for implanted ports and Heparin 10u/ml - 50units (5ml) for all other central venous catheters - every 8 hours. Additionally -  Heparin 10u/ml - 10 units intravenously every 8 hours.    [provider]  hydrocortisone (ANUSOL-HC) 2.5 % rectal cream Place 1 application rectally 2 (two) times daily. 12/02/20   Unk Lightning, PA  lactulose (CHRONULAC) 10 GM/15ML solution Take 15 mLs (10 g total) by mouth 2 (two) times daily as needed for mild constipation. Patient taking differently: Take 10 g by mouth 2 (two) times daily. 04/03/21   Meredeth Ide, MD  levothyroxine (SYNTHROID) 88 MCG tablet Take 88 mcg by mouth daily before breakfast.    [provider]  Magnesium 400 MG TABS Take 400 mg by mouth daily.    [provider]  Omega-3 Fatty Acids (FISH OIL) 1200 MG CAPS Take 1,200 mg by mouth daily at 6 (six) AM.    [provider]  oxyCODONE (OXY IR/ROXICODONE) 5 MG immediate release tablet Take 1 tablet (5 mg total) by mouth every 6 (six) hours as needed for severe pain. Patient not taking: Reported on 04/28/2021 04/03/21   Meredeth Ide, MD  rosuvastatin (CRESTOR) 5 MG tablet Take 1 tablet (5 mg total) by mouth daily. 01/01/21   Chandrasekhar, Mahesh A, MD  senna-docusate (SENOKOT-S) 8.6-50 MG tablet Take 1 tablet by mouth 2 (two) times daily. 04/03/21   Meredeth Ide, MD  sodium chloride 0.9 % injection Inject 10 mLs into the vein every 8 (  eight) hours.    [provider]    Physical Exam: Vitals:   06/30/22 2100 06/30/22 2130 06/30/22 2145 06/30/22 2215  BP: (!) 88/19 (!) 131/32 (!) 148/59 (!) 148/67  Pulse: 94 90 94 94  Resp: 17 14 14  (!) 23  Temp:      TempSrc:      SpO2: 99% 100% 99% 100%  Weight:      Height:         Constitutional: NAD, no pallor or diaphoresis   Eyes: PERTLA, lids and conjunctivae normal ENMT: Mucous membranes are dry. Skin tears and ecchymosis. Posterior pharynx clear of any exudate or lesions.   Neck: supple, no masses  Respiratory: no wheezing, no crackles. No accessory muscle use.  Cardiovascular: S1 & S2 heard, regular rate and rhythm. No extremity edema.   Abdomen: No distension, no tenderness, soft. Bowel sounds active.  Musculoskeletal: no clubbing / cyanosis. No joint deformity upper and lower extremities.   Skin: no significant rashes, lesions, ulcers. Warm, dry, well-perfused. Neurologic: CN 2-12 grossly intact. Moving all extremities. Alert and oriented to person only.  Psychiatric: Intermittently agitated. Disoriented. Distracted.     Labs and Imaging on Admission: I have personally reviewed following labs and imaging studies  CBC: Recent Labs  Lab 06/30/22 2050 06/30/22 2115  WBC 16.3*  --   NEUTROABS 14.9*  --   HGB 15.8* 16.0*  HCT 49.1* 47.0*  MCV 93.3  --   PLT 223  --    Basic Metabolic Panel: Recent Labs  Lab 06/30/22 1904  06/30/22 2050 06/30/22 2115  NA 140 142 142  K 4.7 4.7 4.4  CL 105 100 103  CO2 12* 22  --   GLUCOSE 217* 197* 189*  BUN 69* 64* 59*  CREATININE 2.62* 2.63* 2.70*  CALCIUM 9.1 9.5  --    GFR: Estimated Creatinine Clearance: 15.6 mL/min (A) (by C-G formula based on SCr of 2.7 mg/dL (H)). Liver Function Tests: Recent Labs  Lab 06/30/22 1904 06/30/22 2050  AST 39 32  ALT QUANTITY NOT SUFFICIENT, UNABLE TO PERFORM TEST 21  ALKPHOS 70 81  BILITOT QUANTITY NOT SUFFICIENT, UNABLE TO PERFORM TEST 1.3*  PROT 6.0* 6.5  ALBUMIN 3.1* 3.3*   No results for input(s): "LIPASE", "AMYLASE" in the last 168 hours. No results for input(s): "AMMONIA" in the last 168 hours. Coagulation Profile: No results for input(s): "INR", "PROTIME" in the last 168 hours. Cardiac Enzymes: No results for input(s): "CKTOTAL", "CKMB", "CKMBINDEX", "TROPONINI" in the last 168 hours. BNP (last 3 results) No results for input(s): "PROBNP" in the last 8760 hours. HbA1C: No results for input(s): "HGBA1C" in the last 72 hours. CBG: Recent Labs  Lab 06/30/22 1720 06/30/22 1748  GLUCAP 237* 192*   Lipid Profile: No results for input(s): "CHOL", "HDL", "LDLCALC", "TRIG", "CHOLHDL", "LDLDIRECT" in the last 72 hours. Thyroid Function Tests: No results for input(s): "TSH", "T4TOTAL", "FREET4", "T3FREE", "THYROIDAB" in the last 72 hours. Anemia Panel: No results for input(s): "VITAMINB12", "FOLATE", "FERRITIN", "TIBC", "IRON", "RETICCTPCT" in the last 72 hours. Urine analysis:    Component Value Date/Time   COLORURINE STRAW (A) 05/08/2021 1646   APPEARANCEUR HAZY (A) 05/08/2021 1646   LABSPEC 1.005 05/08/2021 1646   PHURINE 7.0 05/08/2021 1646   GLUCOSEU NEGATIVE 05/08/2021 1646   HGBUR SMALL (A) 05/08/2021 1646   BILIRUBINUR NEGATIVE 05/08/2021 1646   KETONESUR NEGATIVE 05/08/2021 1646   PROTEINUR NEGATIVE 05/08/2021 1646   UROBILINOGEN 0.2 11/09/2013 1143   NITRITE NEGATIVE 05/08/2021  1646    LEUKOCYTESUR LARGE (A) 05/08/2021 1646   Sepsis Labs: @LABRCNTIP (procalcitonin:4,lacticidven:4) )No results found for this or any previous visit (from the past 240 hour(s)).   Radiological Exams on Admission: DG Pelvis Portable  Result Date: 06/30/2022 CLINICAL DATA:  Pain after fall EXAM: PORTABLE PELVIS 1-2 VIEWS COMPARISON:  None Available. FINDINGS: Patient is rotated to the right. Severe osteopenia. No dislocation. Slight deformity of the superior ramus of the left pubic bone. Preserved joint spaces. There are some scattered vascular calcifications. With this level of severe osteopenia a subtle nondisplaced injury is difficult to completely exclude and if needed further workup with CT or MRI as clinically appropriate. IMPRESSION: Severe osteopenia. Slight deformity of the left superior pubic ramus. A subtle fracture is possible. Additional cross-sectional evaluation as clinically appropriate Electronically Signed   By: Karen Kays M.D.   On: 06/30/2022 18:23   DG Chest Port 1 View  Result Date: 06/30/2022 CLINICAL DATA:  Sepsis.  Fall EXAM: PORTABLE CHEST 1 VIEW COMPARISON:  X-ray 03/08/2021 and CT scan FINDINGS: Patient is rotated to the left. The left inferior costophrenic angle is clipped off the edge of the film. Overlapping cardiac leads. Otherwise no consolidation, pneumothorax, effusion or edema. Normal cardiopericardial silhouette. Calcified aorta. Curvature and degenerative changes of the spine. Osteopenia. IMPRESSION: Grossly no acute cardiopulmonary disease.  Rotated radiograph. Electronically Signed   By: Karen Kays M.D.   On: 06/30/2022 18:19   CT Head Wo Contrast  Result Date: 06/30/2022 CLINICAL DATA:  Level 1 trauma EXAM: CT HEAD WITHOUT CONTRAST CT CERVICAL SPINE WITHOUT CONTRAST TECHNIQUE: Multidetector CT imaging of the head and cervical spine was performed following the standard protocol without intravenous contrast. Multiplanar CT image reconstructions of the cervical  spine were also generated. RADIATION DOSE REDUCTION: This exam was performed according to the departmental dose-optimization program which includes automated exposure control, adjustment of the mA and/or kV according to patient size and/or use of iterative reconstruction technique. COMPARISON:  CT head and cervical spine 03/08/2021 FINDINGS: CT HEAD FINDINGS Brain: No evidence of acute infarct, hemorrhage, mass, mass effect, or midline shift. No hydrocephalus or extra-axial fluid collection. Vascular: No hyperdense vessel. Skull: Negative for fracture or focal lesion. Sinuses/Orbits: No acute finding. Other: The mastoid air cells are well aerated. CT CERVICAL SPINE FINDINGS Alignment: No traumatic listhesis. Reversal of the normal cervical lordosis with redemonstrated mild anterolisthesis of C3 on C4, trace retrolisthesis of C5 on C6, and trace anterolisthesis of C7 on T1. Skull base and vertebrae: No acute fracture or suspicious osseous lesion. Soft tissues and spinal canal: No prevertebral fluid or swelling. No visible canal hematoma. Disc levels: Degenerative changes in the cervical spine. Moderate spinal canal stenosis at C5-C6. Multilevel facet and uncovertebral hypertrophy. Upper chest: Negative. IMPRESSION: 1. No acute intracranial process. 2. No acute fracture or traumatic listhesis in the cervical spine. These results were called by telephone at the time of interpretation on 06/30/2022 at 6:12 pm to provider [facility], who verbally acknowledged these results. Electronically Signed   By: Wiliam Ke M.D.   On: 06/30/2022 18:13   CT Cervical Spine Wo Contrast  Result Date: 06/30/2022 CLINICAL DATA:  Level 1 trauma EXAM: CT HEAD WITHOUT CONTRAST CT CERVICAL SPINE WITHOUT CONTRAST TECHNIQUE: Multidetector CT imaging of the head and cervical spine was performed following the standard protocol without intravenous contrast. Multiplanar CT image reconstructions of the cervical spine were also generated. RADIATION  DOSE REDUCTION: This exam was performed according to the departmental dose-optimization program which includes  automated exposure control, adjustment of the mA and/or kV according to patient size and/or use of iterative reconstruction technique. COMPARISON:  CT head and cervical spine 03/08/2021 FINDINGS: CT HEAD FINDINGS Brain: No evidence of acute infarct, hemorrhage, mass, mass effect, or midline shift. No hydrocephalus or extra-axial fluid collection. Vascular: No hyperdense vessel. Skull: Negative for fracture or focal lesion. Sinuses/Orbits: No acute finding. Other: The mastoid air cells are well aerated. CT CERVICAL SPINE FINDINGS Alignment: No traumatic listhesis. Reversal of the normal cervical lordosis with redemonstrated mild anterolisthesis of C3 on C4, trace retrolisthesis of C5 on C6, and trace anterolisthesis of C7 on T1. Skull base and vertebrae: No acute fracture or suspicious osseous lesion. Soft tissues and spinal canal: No prevertebral fluid or swelling. No visible canal hematoma. Disc levels: Degenerative changes in the cervical spine. Moderate spinal canal stenosis at C5-C6. Multilevel facet and uncovertebral hypertrophy. Upper chest: Negative. IMPRESSION: 1. No acute intracranial process. 2. No acute fracture or traumatic listhesis in the cervical spine. These results were called by telephone at the time of interpretation on 06/30/2022 at 6:12 pm to provider [facility], who verbally acknowledged these results. Electronically Signed   By: Wiliam Ke M.D.   On: 06/30/2022 18:13    EKG: Independently reviewed. Sinus rhythm, incomplete RBBB and LAFB, QTc 538 ms.   Assessment/Plan   1. AKI superimposed on CKD IIIa; metabolic acidosis   - SCr is 2.62 on admission, up from baseline of ~1.2; serum bicarb is 12 and AG 23   - She appears hypovolemic and was given 2 liters LR in ED  - Continue IVF hydration with isotonic bicarbonate, renally-dose medications, repeat chem panel in am    2. SIRS   - Multiple SIRS criteria present on admission without clear source  - Check UA, continue broad-spectrum antibiotics for now, check procalcitonin, follow cultures and clinical course     3. Hypothermia  - Rectal temp 35.2 C in ED, possibly from infection  - Check cortisol and TSH, continue antibiotics, warming measures    4. Type II DM  - A1c was 6.6% in December 2023  - She is not on any medications at home  - Check CBGs and use low-intensity SSI for now    5. Chronic HFpEF  - Appears hypovolemic on admission  - Monitor weight and I/Os   6. Goals of care  - Pt's husband has advanced dementia; her son and daughter are interested in hospice and other options with Palliative Care  - Palliative Care consulted     DVT prophylaxis: sq heparin  Code Status: DNR  Level of Care: Level of care: Progressive Family Communication: Son and daughter updated on admission  Disposition Plan:  Patient is from: home  Anticipated d/c is to: TBD Anticipated d/c date is: 07/03/22  Patient currently: Pending stable vitals and renal function, Palliative Care consultation and dispo planning Consults called: None  Admission status: Inpatient     Briscoe Deutscher, MD [facility]  06/30/2022, 10:45 PM

## 2022-06-30 NOTE — ED Notes (Signed)
Unable to obtain Blood cultures. Pt difficult stick.

## 2022-06-30 NOTE — ED Notes (Signed)
Trauma Response Nurse Documentation   [name] [name] is a 77 y.o. female arriving to [facility] ED via EMS  On No antithrombotic. Trauma was activated as a Level 1 by TRN based on the following trauma criteria Anytime Systolic Blood Pressure < 90.  Patient cleared for CT by Dr. Bedelia Person. Pt transported to CT with trauma response nurse present to monitor. RN remained with the patient throughout their absence from the department for clinical observation.   GCS 13. (Baseline)  History   Past Medical History:  Diagnosis Date   Anxiety    being weaned off lithium will be off on 06/23/11   Bipolar disorder (HCC)    Cancer (HCC) 07/2019   right breast IDC with DCIS   Diastolic heart failure (HCC)    Diverticulosis 09/01/2011   mild, left sided   Family history of breast cancer    Family history of colon cancer    Hyperlipidemia    Hypothyroidism    IBS (irritable bowel syndrome)    Leg cramps    Mood disorder (HCC)    Morbidly obese (HCC)    Numbness and tingling in left arm    Osteopenia    Prediabetes    Stage 3 chronic kidney disease (HCC)    CKD stg 3   Vitamin D deficiency      Past Surgical History:  Procedure Laterality Date   back biopsy     for skin   BREAST LUMPECTOMY WITH RADIOACTIVE SEED AND SENTINEL LYMPH NODE BIOPSY Right 08/29/2019   Procedure: RIGHT BREAST LUMPECTOMY WITH RADIOACTIVE SEED AND SENTINEL LYMPH NODE BIOPSY;  Surgeon: Harriette Bouillon, MD;  Location: [facility];  Service: General;  Laterality: Right;   COLONOSCOPY  06/09/2006   Dr.Orr   COLONOSCOPY  09/01/2011   DILATATION & CURETTAGE/HYSTEROSCOPY WITH TRUECLEAR N/A 11/12/2013   Procedure: RESECTOSCOPIC POLYPECTOMY Aileen Pilot, D&C;  Surgeon: Ok Edwards, MD;  Location: [facility];  Service: Gynecology;  Laterality: N/A;   ENDOMETRIAL BIOPSY  09/14/2013   Dr. Reynaldo Minium   RADIOLOGY WITH ANESTHESIA N/A 03/14/2021   Procedure: MRI [facility] AND LUMBAR WITH AND WITHOUT CONTRAST WITH  ANESTHESIA;  Surgeon: Radiologist, Medication, MD;  Location: [facility];  Service: Radiology;  Laterality: N/A;     Initial Focused Assessment (If applicable, or please see trauma documentation): - GCS 13 (baseline) - PERRLA - skin tear to L cheek - bleeding controlled - skin tear to sternum - bleeding controlled - Sat 100% on RA - severely hypotensive (unable to get manual - multiple RNs attempted)  CT's Completed:   CT Head and CT C-Spine   Interventions:  - 22G PIV to L AC - CXR - Pelvic XR - CT head and c-spine - Dr Bedelia Person spoke with family - pt a DNR - antibx administered - 2L NS given - attempted [place] IV and labs - attempted c-collar - pt refused  Plan for disposition:  Other Awaiting family's wishes  Consults completed:  none at 1815.  Event Summary: Pt comes from home with family.  Per EMS, they were called to the home for respiratory distress.  Upon arrival, EMS discovered facial trauma.  Pt's son admitted that pt fell out of bed.  No LOC per family.  Upon arrival to ED, RNs were unable to get a manual BP.  Automatic cuff consistently systolic in the 30's.  Level 1 trauma called at this time.   Bedside handoff with ED RN Alonna Buckler, Durwin Nora  Trauma Response RN  Please call TRN at [phone] for further assistance.

## 2022-06-30 NOTE — ED Notes (Signed)
IV placed by IV team, labs drawn. Pt in no acute distress. Bed locked & low.

## 2022-06-30 NOTE — ED Triage Notes (Signed)
Pt bib [facility] from home where she lives with family. Pt had an unwitnessed fall today out of bed and has left facial, chest, and arm skin tears. Pt has dementia and is at baseline other than increased agitation per family. No thinners or any medications taken per EMS EMS vitals:92/50, 108HR

## 2022-06-30 NOTE — Sepsis Progress Note (Signed)
Elink following code sepsis °

## 2022-06-30 NOTE — ED Notes (Signed)
Pt report received from previous nurse. Pt alert to sound, not oriented, Call bell in reach. No acute distress noted. Report states that pt was stuck 6 x's with no success. IV team orders in.

## 2022-06-30 NOTE — ED Notes (Signed)
ED TO INPATIENT HANDOFF REPORT  ED Nurse Name and Phone #: tan [phone]  S Name/Age/Gender [name] [name] 77 y.o. female Room/Bed: 002C/002C  Code Status   Code Status: DNR  Home/SNF/Other Home Patient oriented to: self Is this baseline? Yes   Triage Complete: Triage complete  Chief Complaint Acute renal failure superimposed on stage 3a chronic kidney disease (HCC) [N17.9, N18.31]  Triage Note Pt bib [facility] from home where she lives with family. Pt had an unwitnessed fall today out of bed and has left facial, chest, and arm skin tears. Pt has dementia and is at baseline other than increased agitation per family. No thinners or any medications taken per EMS EMS vitals:92/50, 108HR   Allergies No Known Allergies  Level of Care/Admitting Diagnosis ED Disposition     ED Disposition  Admit   Condition  --   Comment  Hospital Area: [facility] [[id number]]  Level of Care: Progressive [102]  Admit to Progressive based on following criteria: MULTISYSTEM THREATS such as stable sepsis, metabolic/electrolyte imbalance with or without encephalopathy that is responding to early treatment.  May admit patient to [facility] or [facility] if equivalent level of care is available:: Yes  Covid Evaluation: Asymptomatic - no recent exposure (last 10 days) testing not required  Diagnosis: Acute renal failure superimposed on stage 3a chronic kidney disease [facility]) [[id number]]  Admitting Physician: Briscoe Deutscher [[id number]]  Attending Physician: Briscoe Deutscher [[id number]]  Certification:: I certify this patient will need inpatient services for at least 2 midnights  Estimated Length of Stay: 4          B Medical/Surgery History Past Medical History:  Diagnosis Date   Anxiety    being weaned off lithium will be off on 06/23/11   Bipolar disorder (HCC)    Cancer (HCC) 07/2019   right breast IDC with DCIS   Diastolic heart failure (HCC)    Diverticulosis 09/01/2011   mild,  left sided   Family history of breast cancer    Family history of colon cancer    Hyperlipidemia    Hypothyroidism    IBS (irritable bowel syndrome)    Leg cramps    Mood disorder (HCC)    Morbidly obese (HCC)    Numbness and tingling in left arm    Osteopenia    Prediabetes    Stage 3 chronic kidney disease (HCC)    CKD stg 3   Vitamin D deficiency    Past Surgical History:  Procedure Laterality Date   back biopsy     for skin   BREAST LUMPECTOMY WITH RADIOACTIVE SEED AND SENTINEL LYMPH NODE BIOPSY Right 08/29/2019   Procedure: RIGHT BREAST LUMPECTOMY WITH RADIOACTIVE SEED AND SENTINEL LYMPH NODE BIOPSY;  Surgeon: Harriette Bouillon, MD;  Location: [facility];  Service: General;  Laterality: Right;   COLONOSCOPY  06/09/2006   Dr.Orr   COLONOSCOPY  09/01/2011   DILATATION & CURETTAGE/HYSTEROSCOPY WITH TRUECLEAR N/A 11/12/2013   Procedure: RESECTOSCOPIC POLYPECTOMY Aileen Pilot, D&C;  Surgeon: Ok Edwards, MD;  Location: [facility];  Service: Gynecology;  Laterality: N/A;   ENDOMETRIAL BIOPSY  09/14/2013   Dr. Reynaldo Minium   RADIOLOGY WITH ANESTHESIA N/A 03/14/2021   Procedure: MRI [facility] AND LUMBAR WITH AND WITHOUT CONTRAST WITH ANESTHESIA;  Surgeon: Radiologist, Medication, MD;  Location: [facility];  Service: Radiology;  Laterality: N/A;     A IV Location/Drains/Wounds Patient Lines/Drains/Airways Status     Active Line/Drains/Airways     Name  Placement date Placement time Site Days   Peripheral IV 06/30/22 22 G Left Antecubital 06/30/22  1730  Antecubital  less than 1   Peripheral IV 06/30/22 20 G 1.88" Anterior;Right;Upper Arm 06/30/22  2035  Arm  less than 1   Peripheral IV 06/30/22 22 G 1.75" Anterior;Right Forearm 06/30/22  2051  Forearm  less than 1   PICC Single Lumen 04/28/21 Left Cephalic 40 cm 0 cm 04/28/21  8657  Cephalic  428   Urethral Catheter Judie Bonus Paramedic Straight-tip;Double-lumen;Latex 14 Fr. 05/08/21  1658   Straight-tip;Double-lumen;Latex  418            Intake/Output Last 24 hours  Intake/Output Summary (Last 24 hours) at 06/30/2022 2233 Last data filed at 06/30/2022 2152 Gross per 24 hour  Intake 1100 ml  Output --  Net 1100 ml    Labs/Imaging Results for orders placed or performed during the hospital encounter of 06/30/22 (from the past 48 hour(s))  CBG monitoring, ED     Status: Abnormal   Collection Time: 06/30/22  5:20 PM  Result Value Ref Range   Glucose-Capillary 237 (H) 70 - 99 mg/dL    Comment: Glucose reference range applies only to samples taken after fasting for at least 8 hours.  CBG monitoring, ED     Status: Abnormal   Collection Time: 06/30/22  5:48 PM  Result Value Ref Range   Glucose-Capillary 192 (H) 70 - 99 mg/dL    Comment: Glucose reference range applies only to samples taken after fasting for at least 8 hours.  Comprehensive metabolic panel     Status: Abnormal   Collection Time: 06/30/22  7:04 PM  Result Value Ref Range   Sodium 140 135 - 145 mmol/L   Potassium 4.7 3.5 - 5.1 mmol/L   Chloride 105 98 - 111 mmol/L   CO2 12 (L) 22 - 32 mmol/L   Glucose, Bld 217 (H) 70 - 99 mg/dL    Comment: Glucose reference range applies only to samples taken after fasting for at least 8 hours.   BUN 69 (H) 8 - 23 mg/dL   Creatinine, Ser 8.46 (H) 0.44 - 1.00 mg/dL   Calcium 9.1 8.9 - 96.2 mg/dL   Total Protein 6.0 (L) 6.5 - 8.1 g/dL   Albumin 3.1 (L) 3.5 - 5.0 g/dL   AST 39 15 - 41 U/L   ALT QUANTITY NOT SUFFICIENT, UNABLE TO PERFORM TEST 0 - 44 U/L    Comment: NOTIFIED T.Lesia Monica,RN @1950  06/30/2022 VANG.J   Alkaline Phosphatase 70 38 - 126 U/L   Total Bilirubin QUANTITY NOT SUFFICIENT, UNABLE TO PERFORM TEST 0.3 - 1.2 mg/dL    Comment: NOTIFIED T.Brycen Bean,RN @1950  06/30/2022 VANG.J   GFR, Estimated 18 (L) >60 mL/min    Comment: (NOTE) Calculated using the CKD-EPI Creatinine Equation (2021)    Anion gap 23 (H) 5 - 15    Comment: QUANTITY NOT SUFFICIENT TO REPEAT  TEST Performed at [facility], [address], [place], [place] [address]   Lactic acid, plasma     Status: Abnormal   Collection Time: 06/30/22  8:50 PM  Result Value Ref Range   Lactic Acid, Venous 5.9 (HH) 0.5 - 1.9 mmol/L    Comment: CRITICAL RESULT CALLED TO, READ BACK BY AND VERIFIED WITH Anner Crete, RN. 2153 06/30/22. LPAIT Performed at [facility], [address] [place], [place] [address]   CBC with Differential/Platelet     Status: Abnormal   Collection Time: 06/30/22  8:50 PM  Result Value Ref Range   WBC 16.3 (H) 4.0 - 10.5 K/uL   RBC 5.26 (H) 3.87 - 5.11 MIL/uL   Hemoglobin 15.8 (H) 12.0 - 15.0 g/dL   HCT 16.1 (H) 09.6 - 04.5 %   MCV 93.3 80.0 - 100.0 fL   MCH 30.0 26.0 - 34.0 pg   MCHC 32.2 30.0 - 36.0 g/dL   RDW 40.9 81.1 - 91.4 %   Platelets 223 150 - 400 K/uL   nRBC 0.0 0.0 - 0.2 %   Neutrophils Relative % 91 %   Neutro Abs 14.9 (H) 1.7 - 7.7 K/uL   Lymphocytes Relative 3 %   Lymphs Abs 0.4 (L) 0.7 - 4.0 K/uL   Monocytes Relative 5 %   Monocytes Absolute 0.8 0.1 - 1.0 K/uL   Eosinophils Relative 0 %   Eosinophils Absolute 0.0 0.0 - 0.5 K/uL   Basophils Relative 0 %   Basophils Absolute 0.0 0.0 - 0.1 K/uL   Immature Granulocytes 1 %   Abs Immature Granulocytes 0.15 (H) 0.00 - 0.07 K/uL    Comment: Performed at [facility], [address], [place], [place] [address]  Comprehensive metabolic panel     Status: Abnormal   Collection Time: 06/30/22  8:50 PM  Result Value Ref Range   Sodium 142 135 - 145 mmol/L   Potassium 4.7 3.5 - 5.1 mmol/L   Chloride 100 98 - 111 mmol/L   CO2 22 22 - 32 mmol/L   Glucose, Bld 197 (H) 70 - 99 mg/dL    Comment: Glucose reference range applies only to samples taken after fasting for at least 8 hours.   BUN 64 (H) 8 - 23 mg/dL   Creatinine, Ser 6.21 (H) 0.44 - 1.00 mg/dL   Calcium 9.5 8.9 - 30.8 mg/dL   Total Protein 6.5 6.5 - 8.1 g/dL   Albumin 3.3 (L) 3.5 - 5.0 g/dL   AST 32 15 - 41 U/L   ALT 21 0 -  44 U/L   Alkaline Phosphatase 81 38 - 126 U/L   Total Bilirubin 1.3 (H) 0.3 - 1.2 mg/dL   GFR, Estimated 18 (L) >60 mL/min    Comment: (NOTE) Calculated using the CKD-EPI Creatinine Equation (2021)    Anion gap 20 (H) 5 - 15    Comment: Performed at [facility], [address] [place], [place] [address]  I-stat chem 8, ED     Status: Abnormal   Collection Time: 06/30/22  9:15 PM  Result Value Ref Range   Sodium 142 135 - 145 mmol/L   Potassium 4.4 3.5 - 5.1 mmol/L   Chloride 103 98 - 111 mmol/L   BUN 59 (H) 8 - 23 mg/dL   Creatinine, Ser 6.96 (H) 0.44 - 1.00 mg/dL   Glucose, Bld 295 (H) 70 - 99 mg/dL    Comment: Glucose reference range applies only to samples taken after fasting for at least 8 hours.   Calcium, Ion 1.11 (L) 1.15 - 1.40 mmol/L   TCO2 24 22 - 32 mmol/L   Hemoglobin 16.0 (H) 12.0 - 15.0 g/dL   HCT 28.4 (H) 13.2 - 44.0 %   DG Pelvis Portable  Result Date: 06/30/2022 CLINICAL DATA:  Pain after fall EXAM: PORTABLE PELVIS 1-2 VIEWS COMPARISON:  None Available. FINDINGS: Patient is rotated to the right. Severe osteopenia. No dislocation. Slight deformity of the superior ramus of the left pubic bone. Preserved joint spaces. There are some scattered  vascular calcifications. With this level of severe osteopenia a subtle nondisplaced injury is difficult to completely exclude and if needed further workup with CT or MRI as clinically appropriate. IMPRESSION: Severe osteopenia. Slight deformity of the left superior pubic ramus. A subtle fracture is possible. Additional cross-sectional evaluation as clinically appropriate Electronically Signed   By: Karen Kays M.D.   On: 06/30/2022 18:23   DG Chest Port 1 View  Result Date: 06/30/2022 CLINICAL DATA:  Sepsis.  Fall EXAM: PORTABLE CHEST 1 VIEW COMPARISON:  X-ray 03/08/2021 and CT scan FINDINGS: Patient is rotated to the left. The left inferior costophrenic angle is clipped off the edge of the film. Overlapping cardiac leads.  Otherwise no consolidation, pneumothorax, effusion or edema. Normal cardiopericardial silhouette. Calcified aorta. Curvature and degenerative changes of the spine. Osteopenia. IMPRESSION: Grossly no acute cardiopulmonary disease.  Rotated radiograph. Electronically Signed   By: Karen Kays M.D.   On: 06/30/2022 18:19   CT Head Wo Contrast  Result Date: 06/30/2022 CLINICAL DATA:  Level 1 trauma EXAM: CT HEAD WITHOUT CONTRAST CT CERVICAL SPINE WITHOUT CONTRAST TECHNIQUE: Multidetector CT imaging of the head and cervical spine was performed following the standard protocol without intravenous contrast. Multiplanar CT image reconstructions of the cervical spine were also generated. RADIATION DOSE REDUCTION: This exam was performed according to the departmental dose-optimization program which includes automated exposure control, adjustment of the mA and/or kV according to patient size and/or use of iterative reconstruction technique. COMPARISON:  CT head and cervical spine 03/08/2021 FINDINGS: CT HEAD FINDINGS Brain: No evidence of acute infarct, hemorrhage, mass, mass effect, or midline shift. No hydrocephalus or extra-axial fluid collection. Vascular: No hyperdense vessel. Skull: Negative for fracture or focal lesion. Sinuses/Orbits: No acute finding. Other: The mastoid air cells are well aerated. CT CERVICAL SPINE FINDINGS Alignment: No traumatic listhesis. Reversal of the normal cervical lordosis with redemonstrated mild anterolisthesis of C3 on C4, trace retrolisthesis of C5 on C6, and trace anterolisthesis of C7 on T1. Skull base and vertebrae: No acute fracture or suspicious osseous lesion. Soft tissues and spinal canal: No prevertebral fluid or swelling. No visible canal hematoma. Disc levels: Degenerative changes in the cervical spine. Moderate spinal canal stenosis at C5-C6. Multilevel facet and uncovertebral hypertrophy. Upper chest: Negative. IMPRESSION: 1. No acute intracranial process. 2. No acute  fracture or traumatic listhesis in the cervical spine. These results were called by telephone at the time of interpretation on 06/30/2022 at 6:12 pm to provider [facility], who verbally acknowledged these results. Electronically Signed   By: Wiliam Ke M.D.   On: 06/30/2022 18:13   CT Cervical Spine Wo Contrast  Result Date: 06/30/2022 CLINICAL DATA:  Level 1 trauma EXAM: CT HEAD WITHOUT CONTRAST CT CERVICAL SPINE WITHOUT CONTRAST TECHNIQUE: Multidetector CT imaging of the head and cervical spine was performed following the standard protocol without intravenous contrast. Multiplanar CT image reconstructions of the cervical spine were also generated. RADIATION DOSE REDUCTION: This exam was performed according to the departmental dose-optimization program which includes automated exposure control, adjustment of the mA and/or kV according to patient size and/or use of iterative reconstruction technique. COMPARISON:  CT head and cervical spine 03/08/2021 FINDINGS: CT HEAD FINDINGS Brain: No evidence of acute infarct, hemorrhage, mass, mass effect, or midline shift. No hydrocephalus or extra-axial fluid collection. Vascular: No hyperdense vessel. Skull: Negative for fracture or focal lesion. Sinuses/Orbits: No acute finding. Other: The mastoid air cells are well aerated. CT CERVICAL SPINE FINDINGS Alignment: No traumatic listhesis. Reversal of the normal  cervical lordosis with redemonstrated mild anterolisthesis of C3 on C4, trace retrolisthesis of C5 on C6, and trace anterolisthesis of C7 on T1. Skull base and vertebrae: No acute fracture or suspicious osseous lesion. Soft tissues and spinal canal: No prevertebral fluid or swelling. No visible canal hematoma. Disc levels: Degenerative changes in the cervical spine. Moderate spinal canal stenosis at C5-C6. Multilevel facet and uncovertebral hypertrophy. Upper chest: Negative. IMPRESSION: 1. No acute intracranial process. 2. No acute fracture or traumatic listhesis in  the cervical spine. These results were called by telephone at the time of interpretation on 06/30/2022 at 6:12 pm to provider [facility], who verbally acknowledged these results. Electronically Signed   By: Wiliam Ke M.D.   On: 06/30/2022 18:13    Pending Labs Unresulted Labs (From admission, onward)     Start     Ordered   07/01/22 0500  Hemoglobin A1c  Tomorrow morning,   R       Comments: To assess prior glycemic control    06/30/22 2217   07/01/22 0500  Basic metabolic panel  Daily,   R      06/30/22 2217   07/01/22 0500  Magnesium  Tomorrow morning,   R        06/30/22 2217   07/01/22 0500  Phosphorus  Tomorrow morning,   R        06/30/22 2217   07/01/22 0500  CBC  Daily,   R      06/30/22 2217   06/30/22 2218  Cortisol  Once,   R        06/30/22 2217   06/30/22 2218  TSH  Once,   R        06/30/22 2217   06/30/22 2218  Procalcitonin  Once,   R       References:    Procalcitonin Lower Respiratory Tract Infection AND Sepsis Procalcitonin Algorithm   06/30/22 2217   06/30/22 1724  Urine Culture  Once,   URGENT       Question:  Indication  Answer:  Altered mental status (if no other cause identified)   06/30/22 1723   06/30/22 1722  Resp panel by RT-PCR (RSV, Flu A&B, Covid) Anterior Nasal Swab  (Septic presentation on arrival (screening labs, nursing and treatment orders for obvious sepsis))  Once,   URGENT        06/30/22 1722   06/30/22 1722  Lactic acid, plasma  (Septic presentation on arrival (screening labs, nursing and treatment orders for obvious sepsis))  Now then every 2 hours,   R (with STAT occurrences)      06/30/22 1722   06/30/22 1722  CBC with Differential  (Septic presentation on arrival (screening labs, nursing and treatment orders for obvious sepsis))  ONCE - STAT,   STAT        06/30/22 1722   06/30/22 1722  Blood Culture (routine x 2)  (Septic presentation on arrival (screening labs, nursing and treatment orders for obvious sepsis))  BLOOD CULTURE X 2,   STAT       06/30/22 1722   06/30/22 1722  Urinalysis, Routine w reflex microscopic -Urine, Catheterized; In and out catheter  (Septic presentation on arrival (screening labs, nursing and treatment orders for obvious sepsis))  ONCE - URGENT,   URGENT       Question Answer Comment  Specimen Source Urine, Catheterized   Specimen Source In and out catheter      06/30/22 1722  Vitals/Pain Today's Vitals   06/30/22 2100 06/30/22 2130 06/30/22 2145 06/30/22 2215  BP: (!) 88/19 (!) 131/32 (!) 148/59 (!) 148/67  Pulse: 94 90 94 94  Resp: 17 14 14  (!) 23  Temp:      TempSrc:      SpO2: 99% 100% 99% 100%  Weight:      Height:        Isolation Precautions No active isolations  Medications Medications  metroNIDAZOLE (FLAGYL) IVPB 500 mg (has no administration in time range)  vancomycin (VANCOREADY) IVPB 1250 mg/250 mL (1,250 mg Intravenous New Bag/Given 06/30/22 2107)  ceFEPIme (MAXIPIME) 2 g in sodium chloride 0.9 % 100 mL IVPB (has no administration in time range)  vancomycin variable dose per unstable renal function (pharmacist dosing) (has no administration in time range)  Tdap (BOOSTRIX) injection 0.5 mL (has no administration in time range)  insulin aspart (novoLOG) injection 0-6 Units (has no administration in time range)  insulin aspart (novoLOG) injection 0-5 Units (has no administration in time range)  heparin injection 5,000 Units (has no administration in time range)  sodium chloride flush (NS) 0.9 % injection 3 mL (has no administration in time range)  acetaminophen (TYLENOL) tablet 650 mg (has no administration in time range)    Or  acetaminophen (TYLENOL) suppository 650 mg (has no administration in time range)  metroNIDAZOLE (FLAGYL) IVPB 500 mg (has no administration in time range)  ceFEPIme (MAXIPIME) 2 g in sodium chloride 0.9 % 100 mL IVPB (0 g Intravenous Stopped 06/30/22 2152)  lactated ringers bolus 1,000 mL (0 mLs Intravenous Stopped 06/30/22 1822)    And   lactated ringers bolus 1,000 mL (0 mLs Intravenous Stopped 06/30/22 2152)    Mobility non-ambulatory     Focused Assessments Cardiac Assessment Handoff:  Cardiac Rhythm: Normal sinus rhythm Lab Results  Component Value Date   CKTOTAL 2,237 (H) 03/08/2021   No results found for: "DDIMER" Does the Patient currently have chest pain? No   , Neuro Assessment Handoff:  Swallow screen pass?  N/a Cardiac Rhythm: Normal sinus rhythm       Neuro Assessment: Exceptions to [facility] Neuro Checks:      Has TPA been given? No If patient is a Neuro Trauma and patient is going to OR before floor call report to 4N Charge nurse: [phone] or [phone]  , Pulmonary Assessment Handoff:  Lung sounds:          R Recommendations: See Admitting Provider Note  Report given to:   Additional Notes: n/a

## 2022-07-01 ENCOUNTER — Inpatient Hospital Stay (HOSPITAL_COMMUNITY): Payer: Medicare Other

## 2022-07-01 DIAGNOSIS — N179 Acute kidney failure, unspecified: Secondary | ICD-10-CM | POA: Diagnosis not present

## 2022-07-01 DIAGNOSIS — R531 Weakness: Secondary | ICD-10-CM

## 2022-07-01 DIAGNOSIS — F0390 Unspecified dementia without behavioral disturbance: Secondary | ICD-10-CM

## 2022-07-01 DIAGNOSIS — W06XXXA Fall from bed, initial encounter: Secondary | ICD-10-CM | POA: Diagnosis not present

## 2022-07-01 LAB — URINALYSIS, ROUTINE W REFLEX MICROSCOPIC
Bacteria, UA: NONE SEEN
Bilirubin Urine: NEGATIVE
Glucose, UA: NEGATIVE mg/dL
Hgb urine dipstick: NEGATIVE
Ketones, ur: NEGATIVE mg/dL
Nitrite: NEGATIVE
Protein, ur: 30 mg/dL — AB
Specific Gravity, Urine: 1.01 (ref 1.005–1.030)
pH: 5 (ref 5.0–8.0)

## 2022-07-01 LAB — BASIC METABOLIC PANEL
Anion gap: 14 (ref 5–15)
BUN: 65 mg/dL — ABNORMAL HIGH (ref 8–23)
CO2: 27 mmol/L (ref 22–32)
Calcium: 8.3 mg/dL — ABNORMAL LOW (ref 8.9–10.3)
Chloride: 98 mmol/L (ref 98–111)
Creatinine, Ser: 2.43 mg/dL — ABNORMAL HIGH (ref 0.44–1.00)
GFR, Estimated: 20 mL/min — ABNORMAL LOW (ref >60)
Glucose, Bld: 144 mg/dL — ABNORMAL HIGH (ref 70–99)
Potassium: 4.7 mmol/L (ref 3.5–5.1)
Sodium: 139 mmol/L (ref 135–145)

## 2022-07-01 LAB — RESP PANEL BY RT-PCR (RSV, FLU A&B, COVID)  RVPGX2
Influenza A by PCR: NEGATIVE
Influenza B by PCR: NEGATIVE
Resp Syncytial Virus by PCR: NEGATIVE
SARS Coronavirus 2 by RT PCR: NEGATIVE

## 2022-07-01 LAB — MAGNESIUM: Magnesium: 2.3 mg/dL (ref 1.7–2.4)

## 2022-07-01 LAB — CBC
HCT: 37.8 % (ref 36.0–46.0)
Hemoglobin: 12.7 g/dL (ref 12.0–15.0)
MCH: 30.8 pg (ref 26.0–34.0)
MCHC: 33.6 g/dL (ref 30.0–36.0)
MCV: 91.5 fL (ref 80.0–100.0)
Platelets: 173 10*3/uL (ref 150–400)
RBC: 4.13 MIL/uL (ref 3.87–5.11)
RDW: 14.6 % (ref 11.5–15.5)
WBC: 12.3 10*3/uL — ABNORMAL HIGH (ref 4.0–10.5)
nRBC: 0 % (ref 0.0–0.2)

## 2022-07-01 LAB — CULTURE, BLOOD (ROUTINE X 2)

## 2022-07-01 LAB — HEMOGLOBIN A1C
Hgb A1c MFr Bld: 5.4 % (ref 4.8–5.6)
Mean Plasma Glucose: 108.28 mg/dL

## 2022-07-01 LAB — GLUCOSE, CAPILLARY
Glucose-Capillary: 136 mg/dL — ABNORMAL HIGH (ref 70–99)
Glucose-Capillary: 157 mg/dL — ABNORMAL HIGH (ref 70–99)
Glucose-Capillary: 62 mg/dL — ABNORMAL LOW (ref 70–99)
Glucose-Capillary: 87 mg/dL (ref 70–99)

## 2022-07-01 LAB — PHOSPHORUS: Phosphorus: 3.3 mg/dL (ref 2.5–4.6)

## 2022-07-01 LAB — CK: Total CK: 126 U/L (ref 38–234)

## 2022-07-01 LAB — LACTIC ACID, PLASMA: Lactic Acid, Venous: 1.9 mmol/L (ref 0.5–1.9)

## 2022-07-01 MED ORDER — LACTATED RINGERS IV BOLUS
1000.0000 mL | Freq: Once | INTRAVENOUS | Status: AC
  Administered 2022-07-01: 1000 mL via INTRAVENOUS

## 2022-07-01 MED ORDER — ALUM & MAG HYDROXIDE-SIMETH 200-200-20 MG/5ML PO SUSP
30.0000 mL | Freq: Once | ORAL | Status: AC
  Administered 2022-07-01: 30 mL via ORAL
  Filled 2022-07-01: qty 30

## 2022-07-01 MED ORDER — PROCHLORPERAZINE EDISYLATE 10 MG/2ML IJ SOLN
10.0000 mg | Freq: Four times a day (QID) | INTRAMUSCULAR | Status: AC | PRN
  Administered 2022-07-01 (×2): 10 mg via INTRAVENOUS
  Filled 2022-07-01 (×2): qty 2

## 2022-07-01 MED ORDER — SODIUM CHLORIDE 0.9 % IV SOLN: INTRAVENOUS | Status: DC

## 2022-07-01 MED ORDER — DEXTROSE 50 % IV SOLN
50.0000 mL | INTRAVENOUS | Status: DC | PRN
  Administered 2022-07-01: 50 mL via INTRAVENOUS
  Filled 2022-07-01: qty 50

## 2022-07-01 NOTE — Progress Notes (Signed)
PROGRESS NOTE        PATIENT DETAILS Name: [name] [name] Age: 77 y.o. Sex: female Date of Birth: 08/17/1945 Admit Date: 06/30/2022 Admitting Physician Briscoe Deutscher, MD WUJ:[id number], [facility]  Brief Summary: Patient is a 77 y.o.  female with chronic HFpEF, dementia, CKD stage IIIa, hypothyroidism, bipolar disorder who presented with a mechanical fall-found to have AKI and subsequently admitted to the hospitalist service.  Significant events: 5/15>> admit to [facility]   Significant studies: 5/15>> CXR: No PNA 5/15>> x-ray pelvis: Slight deformity of left superior pubic rami-subtle fracture is possible. 5/15>> CT head: No acute intracranial process 5/15>> CT C-spine: No fracture or traumatic listhesis  Significant microbiology data: 5/15>> blood culture: No growth 5/15>> RSV/influenza/COVID PCR: Negative  Procedures: None  Consults: None  Subjective: Lying comfortably in bed-denies any chest pain or shortness of breath.  Pleasantly confused-no family at bedside.  Objective: Vitals: Blood pressure 100/70, pulse 87, temperature 98.4 F (36.9 C), temperature source Axillary, resp. rate 16, height 5\' 6"  (1.676 m), weight 48.3 kg, SpO2 99 %.   Exam: Gen Exam:not in any distress HEENT:atraumatic, normocephalic Chest: B/L clear to auscultation anteriorly CVS:S1S2 regular Abdomen:soft non tender, non distended Extremities:no edema Neurology: Non focal Skin: no rash  Pertinent Labs/Radiology:    Latest Ref Rng & Units 07/01/2022    7:02 AM 06/30/2022    9:15 PM 06/30/2022    8:50 PM  CBC  WBC 4.0 - 10.5 K/uL 12.3   16.3   Hemoglobin 12.0 - 15.0 g/dL 56.2  13.0  86.5   Hematocrit 36.0 - 46.0 % 37.8  47.0  49.1   Platelets 150 - 400 K/uL 173   223     Lab Results  Component Value Date   NA 139 07/01/2022   K 4.7 07/01/2022   CL 98 07/01/2022   CO2 27 07/01/2022      Assessment/Plan: AKI on CKD stage IIIa Suspect  hemodynamically mediated-slowly improving Although suspicion for hemodynamically mediated kidney disease-will check UA to assess for proteinuria, renal ultrasound to rule out hydronephrosis Check CK Asked nursing staff to do frequent bladder scans to ensure no retention-although no bladder felt on my exam this morning Avoid nephrotoxic agents  Anion gap metabolic acidosis Suspect due to AKI Has improved Stop bicarb infusion Hydrate with isotonic saline  Fall Skin tear/left facial trauma Imaging as above Appreciate trauma input Received In the ED Local wound care Obtain CT pelvis to make sure no pubic fracture PT/OT  SIRS No infectious cause apparent Cultures pending UA pending Given hemodynamic stability-stop antibiotics and monitor until source becomes apparent/clear. 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   Weight as of this encounter: 48.3 kg.   Code status:   Code Status: DNR   DVT Prophylaxis: heparin injection 5,000 Units Start: 06/30/22 2230    Family Communication: Son: Christiane Ha  Nathaniel, [phone] updated 5/16   Disposition Plan: Status is: Inpatient Remains inpatient appropriate because: Severity of illness   Planned Discharge Destination:Home health   Diet: Diet Order             Diet regular Room service appropriate? Yes; Fluid consistency: Thin  Diet effective now                     Antimicrobial agents: Anti-infectives (From  admission, onward)    Start     Dose/Rate Route Frequency Ordered Stop   07/01/22 1800  ceFEPIme (MAXIPIME) 2 g in sodium chloride 0.9 % 100 mL IVPB        2 g 200 mL/hr over 30 Minutes Intravenous Every 24 hours 06/30/22 2017     07/01/22 0530  metroNIDAZOLE (FLAGYL) IVPB 500 mg        500 mg 100 mL/hr over 60 Minutes Intravenous Every 12 hours 06/30/22 2217 07/08/22 0529   06/30/22 2020  vancomycin variable dose per unstable renal function (pharmacist dosing)         Does not apply See admin instructions 06/30/22 2020     06/30/22 1745  vancomycin (VANCOREADY) IVPB 1250 mg/250 mL        1,250 mg 166.7 mL/hr over 90 Minutes Intravenous  Once 06/30/22 1737 06/30/22 2252   06/30/22 1730  ceFEPIme (MAXIPIME) 2 g in sodium chloride 0.9 % 100 mL IVPB        2 g 200 mL/hr over 30 Minutes Intravenous  Once 06/30/22 1722 06/30/22 2152   06/30/22 1730  metroNIDAZOLE (FLAGYL) IVPB 500 mg        500 mg 100 mL/hr over 60 Minutes Intravenous  Once 06/30/22 1722 07/01/22 0128   06/30/22 1730  vancomycin (VANCOCIN) IVPB 1000 mg/200 mL premix  Status:  Discontinued        1,000 mg 200 mL/hr over 60 Minutes Intravenous  Once 06/30/22 1722 06/30/22 1737        MEDICATIONS: Scheduled Meds:  heparin  5,000 Units Subcutaneous Q8H   insulin aspart  0-5 Units Subcutaneous QHS   insulin aspart  0-6 Units Subcutaneous TID WC   ondansetron       sodium chloride flush  3 mL Intravenous Q12H   vancomycin variable dose per unstable renal function (pharmacist dosing)   Does not apply See admin instructions   Continuous Infusions:  ceFEPime (MAXIPIME) IV     metronidazole 500 mg (07/01/22 0559)   sodium bicarbonate 150 mEq in sterile water 1,150 mL infusion 100 mL/hr at 07/01/22 0026   PRN Meds:.acetaminophen **OR** acetaminophen, ondansetron   I have personally reviewed following labs and imaging studies  LABORATORY DATA: CBC: Recent Labs  Lab 06/30/22 2050 06/30/22 2115 07/01/22 0702  WBC  16.3*  --  12.3*  NEUTROABS 14.9*  --   --   HGB 15.8* 16.0* 12.7  HCT 49.1* 47.0* 37.8  MCV 93.3  --  91.5  PLT 223  --  173    Basic Metabolic Panel: Recent Labs  Lab 06/30/22 1904 06/30/22 2050 06/30/22 2115 07/01/22 0702  NA 140 142 142 139  K 4.7 4.7 4.4 4.7  CL 105 100 103 98  CO2 12* 22  --  27  GLUCOSE 217* 197* 189* 144*  BUN 69* 64* 59* 65*  CREATININE 2.62* 2.63* 2.70* 2.43*  CALCIUM 9.1 9.5  --  8.3*  MG  --   --   --  2.3  PHOS  --   --   --  3.3    GFR: Estimated Creatinine Clearance: 14.8 mL/min (A) (by C-G formula based on SCr of 2.43 mg/dL (H)).  Liver Function Tests: Recent Labs  Lab 06/30/22 1904 06/30/22 2050  AST 39 32  ALT QUANTITY NOT SUFFICIENT, UNABLE TO PERFORM TEST 21  ALKPHOS 70 81  BILITOT QUANTITY NOT SUFFICIENT, UNABLE TO PERFORM TEST 1.3*  PROT 6.0* 6.5  ALBUMIN 3.1* 3.3*   No results for input(s): "LIPASE", "AMYLASE" in the last 168 hours. No results for input(s): "AMMONIA" in the last 168 hours.  Coagulation Profile: No results for input(s): "INR", "PROTIME" in the last 168 hours.  Cardiac Enzymes: No results for input(s): "CKTOTAL", "CKMB", "CKMBINDEX", "TROPONINI" in the last 168 hours.  BNP (last 3 results) No results for input(s): "PROBNP" in the last 8760 hours.  Lipid Profile: No results for input(s): "CHOL", "HDL", "LDLCALC", "TRIG", "CHOLHDL", "LDLDIRECT" in the last 72 hours.  Thyroid Function Tests: Recent Labs    06/30/22 2050  TSH 4.633*    Anemia Panel: No results for input(s): "VITAMINB12", "FOLATE", "FERRITIN", "TIBC", "IRON", "RETICCTPCT" in the last 72 hours.  Urine analysis:    Component Value Date/Time   COLORURINE STRAW (A) 05/08/2021 1646   APPEARANCEUR HAZY (A) 05/08/2021 1646   LABSPEC 1.005 05/08/2021 1646   PHURINE 7.0 05/08/2021 1646   GLUCOSEU NEGATIVE 05/08/2021 1646   HGBUR SMALL (A) 05/08/2021 1646   BILIRUBINUR NEGATIVE 05/08/2021 1646   KETONESUR NEGATIVE 05/08/2021 1646    PROTEINUR NEGATIVE 05/08/2021 1646   UROBILINOGEN 0.2 11/09/2013 1143   NITRITE NEGATIVE 05/08/2021 1646   LEUKOCYTESUR LARGE (A) 05/08/2021 1646    Sepsis Labs: Lactic Acid, Venous    Component Value Date/Time   LATICACIDVEN 1.9 07/01/2022 0702    MICROBIOLOGY: Recent Results (from the past 240 hour(s))  Blood Culture (routine x 2)     Status: None (Preliminary result)   Collection Time: 06/30/22  9:00 PM   Specimen: BLOOD  Result Value Ref Range Status   Specimen Description BLOOD RIGHT ANTECUBITAL  Final   Special Requests   Final    BOTTLES DRAWN AEROBIC AND ANAEROBIC Blood Culture results may not be optimal due to an inadequate volume of blood received in culture bottles   Culture   Final    NO GROWTH < 12 HOURS Performed at [facility], [address], [place], [place] [address]    Report Status PENDING  Incomplete  Blood Culture (routine x 2)     Status: None (Preliminary result)   Collection Time: 06/30/22  9:10 PM   Specimen: BLOOD  Result Value Ref Range Status   Specimen Description BLOOD BLOOD RIGHT FOREARM  Final   Special Requests   Final    BOTTLES DRAWN AEROBIC AND ANAEROBIC Blood Culture results may not be optimal due to an inadequate volume of blood received in culture bottles   Culture   Final    NO GROWTH < 12 HOURS Performed at [facility], [address] [place], [place] [address]    Report Status PENDING  Incomplete  Resp panel by RT-PCR (RSV, Flu A&B, Covid) Anterior Nasal Swab     Status: None   Collection Time: 06/30/22 11:15 PM   Specimen: Anterior Nasal Swab  Result Value Ref Range Status   SARS Coronavirus 2 by RT PCR NEGATIVE NEGATIVE Final   Influenza A by PCR NEGATIVE NEGATIVE Final   Influenza B by PCR NEGATIVE NEGATIVE Final    Comment: (NOTE) The Xpert Xpress SARS-CoV-2/FLU/RSV  plus assay is intended as an aid in the diagnosis of influenza from Nasopharyngeal swab specimens and should not be used as a sole basis for  treatment. Nasal washings and aspirates are unacceptable for Xpert Xpress SARS-CoV-2/FLU/RSV testing.  Fact Sheet for Patients: BloggerCourse.com  Fact Sheet for Healthcare Providers: SeriousBroker.it  This test is not yet approved or cleared by the [place] FDA and has been authorized for detection and/or diagnosis of SARS-CoV-2 by FDA under an Emergency Use Authorization (EUA). This EUA will remain in effect (meaning this test can be used) for the duration of the COVID-19 declaration under Section 564(b)(1) of the Act, 21 U.S.C. section 360bbb-3(b)(1), unless the authorization is terminated or revoked.     Resp Syncytial Virus by PCR NEGATIVE NEGATIVE Final    Comment: (NOTE) Fact Sheet for Patients: BloggerCourse.com  Fact Sheet for Healthcare Providers: SeriousBroker.it  This test is not yet approved or cleared by the [place] FDA and has been authorized for detection and/or diagnosis of SARS-CoV-2 by FDA under an Emergency Use Authorization (EUA). This EUA will remain in effect (meaning this test can be used) for the duration of the COVID-19 declaration under Section 564(b)(1) of the Act, 21 U.S.C. section 360bbb-3(b)(1), unless the authorization is terminated or revoked.  Performed at [facility], [address] [place], [place] [address]     RADIOLOGY STUDIES/RESULTS: DG Pelvis Portable  Result Date: 06/30/2022 CLINICAL DATA:  Pain after fall EXAM: PORTABLE PELVIS 1-2 VIEWS COMPARISON:  None Available. FINDINGS: Patient is rotated to the right. Severe osteopenia. No dislocation. Slight deformity of the superior ramus of the left pubic bone. Preserved joint spaces. There are some scattered vascular calcifications. With this level of severe osteopenia a subtle nondisplaced injury is difficult to completely exclude and if needed further workup with CT or  MRI as clinically appropriate. IMPRESSION: Severe osteopenia. Slight deformity of the left superior pubic ramus. A subtle fracture is possible. Additional cross-sectional evaluation as clinically appropriate Electronically Signed   By: Karen Kays M.D.   On: 06/30/2022 18:23   DG Chest Port 1 View  Result Date: 06/30/2022 CLINICAL DATA:  Sepsis.  Fall EXAM: PORTABLE CHEST 1 VIEW COMPARISON:  X-ray 03/08/2021 and CT scan FINDINGS: Patient is rotated to the left. The left inferior costophrenic angle is clipped off the edge of the film. Overlapping cardiac leads. Otherwise no consolidation, pneumothorax, effusion or edema. Normal cardiopericardial silhouette. Calcified aorta. Curvature and degenerative changes of the spine. Osteopenia. IMPRESSION: Grossly no acute cardiopulmonary disease.  Rotated radiograph. Electronically Signed   By: Karen Kays M.D.   On: 06/30/2022 18:19   CT Head Wo Contrast  Result Date: 06/30/2022 CLINICAL DATA:  Level 1 trauma EXAM: CT HEAD WITHOUT CONTRAST CT CERVICAL SPINE WITHOUT CONTRAST TECHNIQUE: Multidetector CT imaging of the head and cervical spine was performed following the standard protocol without intravenous contrast. Multiplanar CT image reconstructions of the cervical spine were also generated. RADIATION DOSE REDUCTION: This exam was performed according to the departmental dose-optimization program which includes automated exposure control, adjustment of the mA and/or kV according to patient size and/or use of iterative reconstruction technique. COMPARISON:  CT head and cervical spine 03/08/2021 FINDINGS: CT HEAD FINDINGS Brain: No evidence of acute infarct, hemorrhage, mass, mass effect, or midline shift. No hydrocephalus or extra-axial fluid collection. Vascular: No hyperdense vessel. Skull: Negative for fracture or focal lesion. Sinuses/Orbits: No acute finding. Other: The mastoid air cells are well aerated. CT CERVICAL SPINE FINDINGS Alignment: No traumatic  listhesis. Reversal of the normal cervical lordosis with redemonstrated mild anterolisthesis of C3 on C4, trace retrolisthesis of C5 on C6, and trace anterolisthesis of C7 on T1. Skull base and vertebrae: No acute fracture or suspicious osseous lesion. Soft tissues and spinal canal: No prevertebral fluid or swelling. No visible canal hematoma. Disc levels: Degenerative changes in the cervical spine. Moderate spinal canal stenosis at C5-C6. Multilevel facet and uncovertebral hypertrophy. Upper chest: Negative. IMPRESSION: 1. No acute intracranial process. 2. No acute fracture or traumatic listhesis in the cervical spine. These results were called by telephone at the time of interpretation on 06/30/2022 at 6:12 pm to provider [facility], who verbally acknowledged these results. Electronically Signed   By: Wiliam Ke M.D.   On: 06/30/2022 18:13   CT Cervical Spine Wo Contrast  Result Date: 06/30/2022 CLINICAL DATA:  Level 1 trauma EXAM: CT HEAD WITHOUT CONTRAST CT CERVICAL SPINE WITHOUT CONTRAST TECHNIQUE: Multidetector CT imaging of the head and cervical spine was performed following the standard protocol without intravenous contrast. Multiplanar CT image reconstructions of the cervical spine were also generated. RADIATION DOSE REDUCTION: This exam was performed according to the departmental dose-optimization program which includes automated exposure control, adjustment of the mA and/or kV according to patient size and/or use of iterative reconstruction technique. COMPARISON:  CT head and cervical spine 03/08/2021 FINDINGS: CT HEAD FINDINGS Brain: No evidence of acute infarct, hemorrhage, mass, mass effect, or midline shift. No hydrocephalus or extra-axial fluid collection. Vascular: No hyperdense vessel. Skull: Negative for fracture or focal lesion. Sinuses/Orbits: No acute finding. Other: The mastoid air cells are well aerated. CT CERVICAL SPINE FINDINGS Alignment: No traumatic listhesis. Reversal of the normal  cervical lordosis with redemonstrated mild anterolisthesis of C3 on C4, trace retrolisthesis of C5 on C6, and trace anterolisthesis of C7 on T1. Skull base and vertebrae: No acute fracture or suspicious osseous lesion. Soft tissues and spinal canal: No prevertebral fluid or swelling. No visible canal hematoma. Disc levels: Degenerative changes in the cervical spine. Moderate spinal canal stenosis at C5-C6. Multilevel facet and uncovertebral hypertrophy. Upper chest: Negative. IMPRESSION: 1. No acute intracranial process. 2. No acute fracture or traumatic listhesis in the cervical spine. These results were called by telephone at the time of interpretation on 06/30/2022 at 6:12 pm to provider [facility], who verbally acknowledged these results. Electronically Signed   By: Wiliam Ke M.D.   On: 06/30/2022 18:13     LOS: 1 day   Jeoffrey Massed, MD  [facility]    To contact the attending provider between 7A-7P or the covering provider during after hours 7P-7A, please log into the web site [email or web address] and access using universal [place] password for that web site. If you do not have the password, please call the hospital operator.  07/01/2022, 9:42 AM

## 2022-07-01 NOTE — Consult Note (Addendum)
[facility] Psychiatry Brief Note  Service Date: Jul 01, 2022 LOS: 1  Reason for consult: "bipolar disorder, capacity eval" Consulted by: Diamantina Monks, MD  [name] [name] is a 77 y.o. female with PMH of bipolar d/o, hypothyroidism, chronic HFpEF, dementia, CKD stage IIIa, who was admitted to the hospital after a fall.   Attempted to see patient this AM, however patient unable to wake patient from sleep. Psychiatry will continue to try and re-assess, collateral pending.   Collateral information:  Melida Gimenez (daughter) @ [phone] - unable to get in contact, left a HIPAA compliant voicemail Keron Erkkila @ [phone] - unable to get in contact, left a HIPAA compliant voicemail  Princess Bruins, DO PGY-2

## 2022-07-01 NOTE — ED Notes (Signed)
Pt feeling nauseas, given nausea meds per orders. No other acute distress noted

## 2022-07-01 NOTE — ED Notes (Signed)
Pt fed applesauce, tolerated well. No acute distress noted. Call bell in reach. Bed locked & low.

## 2022-07-01 NOTE — ED Notes (Signed)
Pt vitals now stable, pressure is higher now. Pt alert & oriented to self only. No acute distress noted. Call bell in reach. Locked & low.

## 2022-07-02 DIAGNOSIS — Z7189 Other specified counseling: Secondary | ICD-10-CM

## 2022-07-02 DIAGNOSIS — R531 Weakness: Secondary | ICD-10-CM | POA: Diagnosis not present

## 2022-07-02 DIAGNOSIS — Z515 Encounter for palliative care: Secondary | ICD-10-CM

## 2022-07-02 DIAGNOSIS — W06XXXA Fall from bed, initial encounter: Secondary | ICD-10-CM | POA: Diagnosis not present

## 2022-07-02 DIAGNOSIS — F0390 Unspecified dementia without behavioral disturbance: Secondary | ICD-10-CM | POA: Diagnosis not present

## 2022-07-02 DIAGNOSIS — N179 Acute kidney failure, unspecified: Secondary | ICD-10-CM | POA: Diagnosis not present

## 2022-07-02 LAB — CBC
HCT: 30.3 % — ABNORMAL LOW (ref 36.0–46.0)
Hemoglobin: 9.9 g/dL — ABNORMAL LOW (ref 12.0–15.0)
MCH: 30.3 pg (ref 26.0–34.0)
MCHC: 32.7 g/dL (ref 30.0–36.0)
MCV: 92.7 fL (ref 80.0–100.0)
Platelets: 127 10*3/uL — ABNORMAL LOW (ref 150–400)
RBC: 3.27 MIL/uL — ABNORMAL LOW (ref 3.87–5.11)
RDW: 14.6 % (ref 11.5–15.5)
WBC: 9.8 10*3/uL (ref 4.0–10.5)
nRBC: 0 % (ref 0.0–0.2)

## 2022-07-02 LAB — GLUCOSE, CAPILLARY
Glucose-Capillary: 118 mg/dL — ABNORMAL HIGH (ref 70–99)
Glucose-Capillary: 168 mg/dL — ABNORMAL HIGH (ref 70–99)

## 2022-07-02 LAB — BASIC METABOLIC PANEL
Anion gap: 12 (ref 5–15)
BUN: 52 mg/dL — ABNORMAL HIGH (ref 8–23)
CO2: 23 mmol/L (ref 22–32)
Calcium: 7.8 mg/dL — ABNORMAL LOW (ref 8.9–10.3)
Chloride: 108 mmol/L (ref 98–111)
Creatinine, Ser: 2 mg/dL — ABNORMAL HIGH (ref 0.44–1.00)
GFR, Estimated: 25 mL/min — ABNORMAL LOW (ref >60)
Glucose, Bld: 76 mg/dL (ref 70–99)
Potassium: 3.1 mmol/L — ABNORMAL LOW (ref 3.5–5.1)
Sodium: 143 mmol/L (ref 135–145)

## 2022-07-02 MED ORDER — BIOTENE DRY MOUTH MT LIQD: 15.0000 mL | OROMUCOSAL | Status: DC | PRN

## 2022-07-02 MED ORDER — LORAZEPAM 2 MG/ML IJ SOLN: 0.5000 mg | INTRAMUSCULAR | Status: DC | PRN

## 2022-07-02 MED ORDER — LEVOTHYROXINE SODIUM 88 MCG PO TABS: 88.0000 ug | ORAL_TABLET | Freq: Every day | ORAL | Status: DC

## 2022-07-02 MED ORDER — POLYVINYL ALCOHOL 1.4 % OP SOLN: 1.0000 [drp] | Freq: Four times a day (QID) | OPHTHALMIC | Status: DC | PRN

## 2022-07-02 MED ORDER — ONDANSETRON HCL 4 MG/2ML IJ SOLN: 4.0000 mg | Freq: Four times a day (QID) | INTRAMUSCULAR | Status: DC | PRN

## 2022-07-02 MED ORDER — HYDROMORPHONE HCL 1 MG/ML IJ SOLN
0.5000 mg | Freq: Three times a day (TID) | INTRAMUSCULAR | Status: DC
  Administered 2022-07-02 – 2022-07-03 (×3): 0.5 mg via INTRAVENOUS
  Filled 2022-07-02 (×4): qty 0.5

## 2022-07-02 MED ORDER — HYDROMORPHONE HCL 1 MG/ML IJ SOLN
0.5000 mg | INTRAMUSCULAR | Status: DC | PRN
  Administered 2022-07-07: 1 mg via INTRAVENOUS
  Filled 2022-07-02: qty 1

## 2022-07-02 MED ORDER — SODIUM CHLORIDE 0.9 % IV BOLUS
1000.0000 mL | Freq: Once | INTRAVENOUS | Status: AC
  Administered 2022-07-02: 1000 mL via INTRAVENOUS

## 2022-07-02 NOTE — Progress Notes (Signed)
1600 Attempted to place foley per order. Pt agitated and combative during attempt. Per [facility] NP, hold off on foley insertion.

## 2022-07-02 NOTE — Progress Notes (Addendum)
Civil engineer, contracting [facility]) Hospital Liaison Note  Referral received for patient/family potential interest in hospice. Plan is for [facility] liaison and PMT NP to have telephone conference with patient's daughter [name] today at 2:30pm.   Update 3:08pm  Patient has been approved for [facility]. Unfortunately, [facility] is unable to offer a bed today. [facility] liaison will continue to follow and offer bed when one becomes available.  Update 3:26pm [name] informed acc liaison that she would prefer if the patient stayed in the hospital.   Please call with any questions or concerns. Thank you  Dionicio Stall, [facility] Liaison [phone]

## 2022-07-02 NOTE — Progress Notes (Signed)
PT Cancellation Note  Patient Details Name: [name] [name] MRN: [id number] DOB: 1945/11/19   Cancelled Treatment:    Reason Eval/Treat Not Completed: Fatigue/lethargy limiting ability to participate;Patient's level of consciousness (Pt lethargic and difficult to alert with auditory and tactile cues. Pt/family to have discussion for GOC wtih PMT. will follow up at later date/time as schedules allow and if pt remains appropriate for PT services.)   Wynn Maudlin, DPT Acute Rehabilitation Services Office [phone]  07/02/22 10:05 AM

## 2022-07-02 NOTE — Progress Notes (Signed)
OT Cancellation Note  Patient Details Name: [name] [name] MRN: [id number] DOB: 1945/10/20   Cancelled Treatment:    Reason Eval/Treat Not Completed: Fatigue/lethargy limiting ability to participate;Patient's level of consciousness (Pt with significant lethergy and low level of alertness. Pt inconsistently nodded head for yes/no answers but was otherwise unable to participate in session. OT to attempt eval at a later /time as schedules allow and if pt is appropriate to participate.)  Dorene Bruni "Ronaldo Miyamoto" M., OTR/L, MA Acute Rehab [phone]   Lendon Colonel 07/02/2022, 3:53 PM

## 2022-07-02 NOTE — Progress Notes (Signed)
TR[facility] night cross cover note:   I was notified by RN that the patient's blood pressure is running slightly lower this evening.  Systolic blood pressure  throughout dayshift was noted to be in the low 100s.  During tonight's shift, systolic blood pressures have dipped into the 80s, with most recent blood pressure noted to be 82/64 with maps greater than 65 mmHg.  Other vital signs appears stable, with heart rates in the 70s, afebrile, and oxygen saturation noted to be in the high 90s on room air.  No report of associated acute symptoms.  The patient is noted to currently be on continuous normal saline running at 75 cc/h.  I subsequently ordered a 1 L NS bolus.    Newton Pigg, DO Hospitalist

## 2022-07-02 NOTE — TOC Initial Note (Signed)
Transition of Care [facility]) - Initial/Assessment Note    Patient Details  Name: [name] [name] MRN: [id number] Date of Birth: August 28, 1945  Transition of Care [facility]) CM/SW Contact:    Mearl Latin, LCSW Phone Number: 07/02/2022, 8:57 AM  Clinical Narrative:                 CSW received request to have Hospice speak with patient's daughter and son to describe service options. CSW sent request to [facility] with [facility].     Barriers to Discharge: Continued Medical Work up   Patient Goals and CMS Choice Patient states their goals for this hospitalization and ongoing recovery are:: Comfort CMS Medicare.gov Compare Post Acute Care list provided to:: Patient Represenative (must comment) Choice offered to / list presented to : Adult Children      Expected Discharge Plan and Services In-house Referral: Clinical Social Work, Hospice / Palliative Care   Post Acute Care Choice: Hospice Living arrangements for the past 2 months: Single Family Home                                      Prior Living Arrangements/Services Living arrangements for the past 2 months: Single Family Home Lives with:: Spouse Patient language and need for interpreter reviewed:: Yes Do you feel safe going back to the place where you live?: Yes      Need for Family Participation in Patient Care: Yes (Comment) Care giver support system in place?: Yes (comment)   Criminal Activity/Legal Involvement Pertinent to Current Situation/Hospitalization: No - Comment as needed  Activities of Daily Living      Permission Sought/Granted Permission sought to share information with : Facility Medical sales representative, Family Supports Permission granted to share information with : No     Permission granted to share info w AGENCY: Hospice  Permission granted to share info w Relationship: Son/Daughter     Emotional Assessment Appearance:: Appears stated age Attitude/Demeanor/Rapport: Unable to Assess Affect  (typically observed): Unable to Assess Orientation: : Oriented to Self Alcohol / Substance Use: Not Applicable Psych Involvement: No (comment)  Admission diagnosis:  Dehydration [E86.0] Generalized weakness [R53.1] AKI (acute kidney injury) (HCC) [N17.9] Multiple abrasions [T07.XXXA] Fall from bed, initial encounter [W06.XXXA] Contusion of head, initial encounter [S00.93XA] Chronic dementia (HCC) [F03.90] Hypotension due to hypovolemia [E86.1] Acute renal failure superimposed on stage 3a chronic kidney disease (HCC) [N17.9, N18.31] Patient Active Problem List   Diagnosis Date Noted   Acute renal failure superimposed on stage 3a chronic kidney disease (HCC) 06/30/2022   SIRS (systemic inflammatory response syndrome) (HCC) 06/30/2022   Hypotension 06/30/2022   Hypothermia 06/30/2022   Prolonged QT interval 06/30/2022   Generalized weakness    Acute urinary retention 03/19/2021   Abscess    Vertebral osteomyelitis (HCC)    Thrush, oral 03/12/2021   Morbid obesity (HCC) 03/11/2021   MSSA bacteremia 03/09/2021   Sepsis (HCC) 03/08/2021   Acute renal failure superimposed on stage 3b chronic kidney disease (HCC) 03/08/2021   Elevated LFTs 03/08/2021   DMII (diabetes mellitus, type 2) (HCC) 03/08/2021   Normocytic anemia 03/08/2021   Fall at home, initial encounter 03/08/2021   Hypothyroidism 03/08/2021   Chronic diastolic heart failure (HCC) 01/01/2021   Nonrheumatic mitral valve regurgitation 01/01/2021   Stage 3a chronic kidney disease (HCC) 01/01/2021   Genetic testing 08/10/2019   Family history of breast cancer    Family history of colon  cancer    Malignant neoplasm of upper-inner quadrant of right breast in female, estrogen receptor positive (HCC) 07/30/2019   Osteopenia 09/10/2016   Vaginal atrophy 09/10/2015   Menopause 09/10/2015   PCP:  [facility]:   [id number] [place], [place] - [address] AT [employer] [facility] OF [facility] &  [address] [place] [address] Phone: [phone] Fax: [phone]     Social Determinants of Health (SDOH) Social History: SDOH Screenings   Depression (PHQ2-9): Low Risk  (05/19/2021)  Tobacco Use: Low Risk  (06/30/2022)   SDOH Interventions:     Readmission Risk Interventions    04/02/2021    2:53 PM  Readmission Risk Prevention Plan  HRI or Home Care Consult Complete  Social Work Consult for Recovery Care Planning/Counseling Complete  Palliative Care Screening Not Applicable

## 2022-07-02 NOTE — Progress Notes (Addendum)
PROGRESS NOTE        PATIENT DETAILS Name: [name] [name] Age: 77 y.o. Sex: female Date of Birth: Dec 17, 1945 Admit Date: 06/30/2022 Admitting Physician Briscoe Deutscher, MD [id number], [facility]  Brief Summary: Patient is a 77 y.o.  female with chronic HFpEF, dementia, CKD stage IIIa, hypothyroidism, bipolar disorder who presented with a mechanical fall-found to have AKI and subsequently admitted to the hospitalist service.  Significant events: 5/15>> admit to [facility]   Significant studies: 5/15>> CXR: No PNA 5/15>> x-ray pelvis: Slight deformity of left superior pubic rami-subtle fracture is possible. 5/15>> CT head: No acute intracranial process 5/15>> CT C-spine: No fracture or traumatic listhesis 5/16>> CT pelvis: No acute fracture 5/16>> renal ultrasound: No hydronephrosis  Significant microbiology data: 5/15>> blood culture: No growth 5/15>> RSV/influenza/COVID PCR: Negative  Procedures: None  Consults: None  Subjective: Awake/alert-confused-BP soft overnight.  Objective: Vitals: Blood pressure (!) 89/41, pulse 64, temperature 97.7 F (36.5 C), temperature source Oral, resp. rate 12, height 5\' 6"  (1.676 m), weight 47.6 kg, SpO2 97 %.   Exam: Gen Exam: Confused-not in any distress HEENT:atraumatic, normocephalic Chest: B/L clear to auscultation anteriorly CVS:S1S2 regular Abdomen:soft non tender, non distended Extremities:no edema Neurology: Non focal Skin: no rash  Pertinent Labs/Radiology:    Latest Ref Rng & Units 07/02/2022    6:12 AM 07/01/2022    7:02 AM 06/30/2022    9:15 PM  CBC  WBC 4.0 - 10.5 K/uL 9.8  12.3    Hemoglobin 12.0 - 15.0 g/dL 9.9  11.9  14.7   Hematocrit 36.0 - 46.0 % 30.3  37.8  47.0   Platelets 150 - 400 K/uL 127  173      Lab Results  Component Value Date   NA 143 07/02/2022   K 3.1 (L) 07/02/2022   CL 108 07/02/2022   CO2 23 07/02/2022      Assessment/Plan: AKI on CKD stage  IIIa Suspect hemodynamically mediated-slowly improving UA negative for significant proteinuria, CK normal, renal ultrasound negative for hydronephrosis Renal function improving with supportive care Continue to check frequent scans. Avoid nephrotoxic agents Follow electrolytes.  Anion gap metabolic acidosis Suspect due to AKI Resolved with bicarb infusion  Hypokalemia Replete/recheck  Fall Skin tear/left facial trauma Imaging as above Appreciate trauma input Received Tdap in the ED Local wound care PT/OT  SIRS No infectious cause apparent Cultures negative so far UA negative for UTI Extensive imaging negative for any obvious infection Given hemodynamic stability-being monitored off antibiotics.  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   Continue gentle IV fluid hydration  Chronic HFpEF Euvolemic on exam Furosemide on hold due to AKI  HLD Hold statin for now  DM-2 (A1c 5.4 on 5/16) CBG stable with SSI  Recent Labs    07/01/22 2113 07/02/22 0738 07/02/22 1113  GLUCAP 87 118* 168*     Hypothyroidism Synthroid  History of breast cancer Resume anastrozole  Dementia with delirium Delirium precautions  Palliative care DNR in place Poor overall prognosis This MD unable to get in touch with patient's son-however palliative care spoke with him earlier this morning-ongoing discussion with family regarding disposition (home with hospice  versus residential hospice)  Addendum Spoke with daughter-[name] [name]-[phone]-apparently per daughter-patient has been refusing food/drink for a while now, overall quality of life has deteriorated quite a bit.  Daughter is aware that we have temporarily  improved her renal function with IVF and other supportive care. But with poor oral intake-it is highly likely that she will have worsening of her AKI/severe electrolyte abnormalities.  She appears to have terminal dementia and severe failure to thrive syndrome-and probably is at end-of-life.  This MD went over different options for hospice care with the daughter, she is expecting a call from the palliative care team soon for further goals of care discussion.  DNR in place.  Family agrees for only gentle medical treatment at this time.  Will await further recommendations from the palliative care team.    BMI: Estimated body mass index is 16.94 kg/m as calculated from the following:   Height as of this encounter: 5\' 6"  (1.676 m).   Weight as of this encounter: 47.6 kg.   Code status:   Code Status: DNR   DVT Prophylaxis: heparin injection 5,000 Units Start: 06/30/22 2230    Family Communication: SonTwilia [name]- [phone] left voicemail on 5/17.   Disposition Plan: Status is: Inpatient Remains inpatient appropriate because: Severity of illness   Planned Discharge Destination:Home health   Diet: Diet Order             Diet regular Room service appropriate? Yes; Fluid consistency: Thin  Diet effective now                     Antimicrobial agents: Anti-infectives (From admission, onward)    Start     Dose/Rate Route Frequency Ordered Stop   07/01/22 1800  ceFEPIme (MAXIPIME) 2 g in sodium chloride 0.9 % 100 mL IVPB  Status:  Discontinued        2 g 200 mL/hr over 30 Minutes Intravenous Every 24 hours 06/30/22 2017 07/01/22 0950   07/01/22 0530  metroNIDAZOLE (FLAGYL) IVPB 500 mg  Status:  Discontinued        500 mg 100 mL/hr over 60 Minutes Intravenous Every 12 hours 06/30/22 2217 07/01/22 0950   06/30/22 2020  vancomycin variable dose per unstable renal function (pharmacist dosing)  Status:  Discontinued         Does not apply See admin instructions 06/30/22  2020 07/01/22 0950   06/30/22 1745  vancomycin (VANCOREADY) IVPB 1250 mg/250 mL        1,250 mg 166.7 mL/hr over 90 Minutes Intravenous  Once 06/30/22 1737 06/30/22 2252   06/30/22 1730  ceFEPIme (MAXIPIME) 2 g in sodium chloride 0.9 % 100 mL IVPB        2 g 200 mL/hr over 30 Minutes Intravenous  Once 06/30/22 1722 06/30/22 2152   06/30/22 1730  metroNIDAZOLE (FLAGYL) IVPB 500 mg        500 mg 100 mL/hr over 60 Minutes Intravenous  Once 06/30/22 1722 07/01/22 0128   06/30/22 1730  vancomycin (VANCOCIN) IVPB 1000 mg/200 mL premix  Status:  Discontinued        1,000 mg 200 mL/hr over 60 Minutes Intravenous  Once 06/30/22 1722 06/30/22 1737        MEDICATIONS: Scheduled Meds:  heparin  5,000 Units Subcutaneous Q8H   insulin aspart  0-5 Units Subcutaneous QHS   insulin aspart  0-6 Units Subcutaneous TID WC   sodium chloride flush  3 mL Intravenous Q12H   Continuous Infusions:  sodium chloride 75 mL/hr at 07/01/22 1209   PRN Meds:.acetaminophen **OR** acetaminophen, dextrose   I have personally reviewed following labs and imaging studies  LABORATORY DATA: CBC: Recent Labs  Lab 06/30/22 2050 06/30/22 2115 07/01/22 0702 07/02/22 0612  WBC 16.3*  --  12.3* 9.8  NEUTROABS 14.9*  --   --   --   HGB 15.8* 16.0* 12.7 9.9*  HCT 49.1* 47.0* 37.8 30.3*  MCV 93.3  --  91.5 92.7  PLT 223  --  173 127*     Basic Metabolic Panel: Recent Labs  Lab 06/30/22 1904 06/30/22 2050 06/30/22 2115 07/01/22 0702 07/02/22 0612  NA 140 142 142 139 143  K 4.7 4.7 4.4 4.7 3.1*  CL 105 100 103 98 108  CO2 12* 22  --  27 23  GLUCOSE 217* 197* 189* 144* 76  BUN 69* 64* 59* 65* 52*  CREATININE 2.62* 2.63* 2.70* 2.43* 2.00*  CALCIUM 9.1 9.5  --  8.3* 7.8*  MG  --   --   --  2.3  --   PHOS  --   --   --  3.3  --      GFR: Estimated Creatinine Clearance: 17.7 mL/min (A) (by C-G formula based on SCr of 2 mg/dL (H)).  Liver Function Tests: Recent Labs  Lab 06/30/22 1904  06/30/22 2050  AST 39 32  ALT QUANTITY NOT SUFFICIENT, UNABLE TO PERFORM TEST 21  ALKPHOS 70 81  BILITOT QUANTITY NOT SUFFICIENT, UNABLE TO PERFORM TEST 1.3*  PROT 6.0* 6.5  ALBUMIN 3.1* 3.3*    No results for input(s): "LIPASE", "AMYLASE" in the last 168 hours. No results for input(s): "AMMONIA" in the last 168 hours.  Coagulation Profile: No results for input(s): "INR", "PROTIME" in the last 168 hours.  Cardiac Enzymes: Recent Labs  Lab 07/01/22 0700  CKTOTAL 126    BNP (last 3 results) No results for input(s): "PROBNP" in the last 8760 hours.  Lipid Profile: No results for input(s): "CHOL", "HDL", "LDLCALC", "TRIG", "CHOLHDL", "LDLDIRECT" in the last 72 hours.  Thyroid Function Tests: Recent Labs    06/30/22 2050  TSH 4.633*     Anemia Panel: No results for input(s): "VITAMINB12", "FOLATE", "FERRITIN", "TIBC", "IRON", "RETICCTPCT" in the last 72 hours.  Urine analysis:    Component Value Date/Time   COLORURINE YELLOW 07/01/2022 2300   APPEARANCEUR CLEAR 07/01/2022 2300   LABSPEC 1.010 07/01/2022 2300   PHURINE 5.0 07/01/2022 2300   GLUCOSEU NEGATIVE 07/01/2022 2300   HGBUR NEGATIVE 07/01/2022 2300   BILIRUBINUR NEGATIVE 07/01/2022 2300   KETONESUR NEGATIVE 07/01/2022 2300   PROTEINUR 30 (A) 07/01/2022 2300   UROBILINOGEN 0.2 11/09/2013 1143   NITRITE NEGATIVE 07/01/2022 2300   LEUKOCYTESUR TRACE (A) 07/01/2022 2300    Sepsis Labs: Lactic Acid, Venous    Component Value Date/Time   LATICACIDVEN 1.9 07/01/2022 0702    MICROBIOLOGY: Recent Results (from the past 240 hour(s))  Blood Culture (routine x 2)     Status: None (Preliminary result)   Collection Time: 06/30/22  9:00 PM   Specimen: BLOOD  Result Value Ref Range Status   Specimen Description BLOOD RIGHT ANTECUBITAL  Final   Special Requests   Final    BOTTLES DRAWN AEROBIC AND ANAEROBIC Blood Culture results may not be optimal due to an inadequate volume of blood received in culture  bottles   Culture   Final    NO GROWTH 2 DAYS Performed at [facility], [address] [place], [place] [address]    Report Status PENDING  Incomplete  Blood Culture (routine x 2)     Status: None (Preliminary result)   Collection Time: 06/30/22  9:10 PM   Specimen: BLOOD  Result Value Ref Range Status   Specimen Description BLOOD BLOOD RIGHT FOREARM  Final   Special Requests   Final    BOTTLES DRAWN AEROBIC AND ANAEROBIC Blood Culture results may not be optimal due to an inadequate volume of blood received in culture bottles   Culture   Final    NO GROWTH 2 DAYS Performed at [facility], [address] [place], [place] [address]    Report Status PENDING  Incomplete  Resp panel by RT-PCR (RSV, Flu A&B, Covid) Anterior Nasal Swab     Status: None   Collection Time: 06/30/22 11:15 PM   Specimen: Anterior Nasal Swab  Result Value Ref Range Status   SARS Coronavirus 2 by RT PCR NEGATIVE NEGATIVE Final   Influenza A by PCR NEGATIVE NEGATIVE Final   Influenza B by PCR NEGATIVE NEGATIVE Final    Comment: (NOTE) The Xpert Xpress SARS-CoV-2/FLU/RSV plus assay is intended as an aid in the diagnosis of influenza from Nasopharyngeal swab specimens and should not be used as a sole basis for treatment. Nasal washings and aspirates are unacceptable for Xpert Xpress SARS-CoV-2/FLU/RSV testing.  Fact Sheet for Patients: BloggerCourse.com  Fact Sheet for Healthcare Providers: SeriousBroker.it  This test is not yet approved or cleared by the [place] FDA and has been authorized for detection and/or diagnosis of SARS-CoV-2 by FDA under an Emergency Use Authorization (EUA). This EUA will remain in effect (meaning this test can be used) for the duration of the COVID-19 declaration under Section 564(b)(1) of the Act, 21 U.S.C. section 360bbb-3(b)(1), unless the authorization is terminated or revoked.     Resp Syncytial  Virus by PCR NEGATIVE NEGATIVE Final    Comment: (NOTE) Fact Sheet for Patients: BloggerCourse.com  Fact Sheet for Healthcare Providers: SeriousBroker.it  This test is not yet approved or cleared by the [place] FDA and has been authorized for detection and/or diagnosis of SARS-CoV-2 by FDA under an Emergency Use Authorization (EUA). This EUA will remain in effect (meaning this test can be used) for the duration of the COVID-19 declaration under Section 564(b)(1) of the Act, 21 U.S.C. section 360bbb-3(b)(1), unless the authorization is terminated or revoked.  Performed at [facility], [address] [place], [place] [address]     RADIOLOGY STUDIES/RESULTS: CT PELVIS WO CONTRAST  Result Date: 07/01/2022 CLINICAL DATA:  Hip trauma with fracture suspected. Question pubic fracture. EXAM: CT PELVIS WITHOUT CONTRAST TECHNIQUE: Multidetector CT imaging of the pelvis was performed following the standard protocol without intravenous contrast. RADIATION DOSE REDUCTION: This exam was performed according to the departmental dose-optimization program which includes automated exposure control, adjustment of the mA and/or kV according to patient size and/or use of iterative reconstruction technique. COMPARISON:  Radiograph from yesterday FINDINGS: Remote fractures with completed healing and sclerosis at the right medial pubic bone and involving the left superior and inferior pubic rami. Remote fracture with band of sclerosis at the left sacral ala and crossing the sacral body. Both hips are intact and located. No acute soft tissue finding. Mild degenerative spurring at the hips. IMPRESSION: Remote bilateral pubic bone and sacral insufficiency fractures. No acute fracture. Electronically Signed   By: Tiburcio Pea M.D.   On: 07/01/2022 15:53   US RENAL  Result Date: 07/01/2022 CLINICAL DATA:  [id number] AKI (acute kidney injury) (HCC) [id number]  EXAM: RENAL / URINARY TRACT ULTRASOUND COMPLETE COMPARISON:  None Available. FINDINGS: Right Kidney: Renal measurements: 8.9 x 5.0 x 5.5 cm = volume: 126.4 mL. Two simple cysts measuring up to 2.3 and 2.9 cm respectively, no follow-up imaging is recommended. No hydronephrosis. Echogenic renal cortex with cortical thinning. Left Kidney: Renal measurements: 7.2 x 4.2 x 3.1 cm = volume: 48.3 mL. Two simple cysts measuring up to 1.2 and 1.0 cm respectively, no follow-up imaging is recommended. Echogenic cortex with cortical thinning. No hydronephrosis. Bladder: Appears normal for degree of bladder distention. Other: None. IMPRESSION: No hydronephrosis. Echogenic renal cortex bilaterally, as can be seen in medical renal disease. Electronically Signed   By: Caprice Renshaw M.D.   On: 07/01/2022 11:42   DG Pelvis Portable  Result Date: 06/30/2022 CLINICAL DATA:  Pain after fall EXAM: PORTABLE PELVIS 1-2 VIEWS COMPARISON:  None Available. FINDINGS: Patient is rotated to the right. Severe osteopenia. No dislocation. Slight deformity of the superior ramus of the left pubic bone. Preserved joint spaces. There are some scattered vascular calcifications. With this level of severe osteopenia a subtle nondisplaced injury is difficult to completely exclude and if needed further workup with CT or MRI as clinically appropriate. IMPRESSION: Severe osteopenia. Slight deformity of the left superior pubic ramus. A subtle fracture is possible. Additional cross-sectional evaluation as clinically appropriate Electronically Signed   By: Karen Kays M.D.   On: 06/30/2022 18:23   DG Chest Port 1 View  Result Date: 06/30/2022 CLINICAL DATA:  Sepsis.  Fall EXAM: PORTABLE CHEST 1 VIEW COMPARISON:  X-ray 03/08/2021 and CT scan FINDINGS: Patient is rotated to the left. The left inferior costophrenic angle is clipped off the edge of the film. Overlapping cardiac leads. Otherwise no consolidation, pneumothorax, effusion or edema. Normal  cardiopericardial silhouette. Calcified aorta. Curvature and degenerative changes of the spine. Osteopenia. IMPRESSION: Grossly no acute cardiopulmonary disease.  Rotated radiograph. Electronically Signed   By: Karen Kays M.D.   On: 06/30/2022 18:19   CT Head Wo Contrast  Result Date: 06/30/2022 CLINICAL DATA:  Level 1 trauma EXAM: CT HEAD WITHOUT CONTRAST CT CERVICAL SPINE WITHOUT CONTRAST TECHNIQUE: Multidetector CT imaging of the head and cervical spine was performed following the standard protocol without intravenous contrast. Multiplanar CT image reconstructions of the cervical spine were also generated. RADIATION DOSE REDUCTION: This exam was performed according to the departmental dose-optimization program which includes automated exposure control, adjustment of the mA and/or kV according to patient size and/or use of iterative reconstruction technique. COMPARISON:  CT head and cervical spine 03/08/2021 FINDINGS: CT HEAD FINDINGS Brain: No evidence of acute infarct, hemorrhage, mass, mass effect, or midline shift. No hydrocephalus or extra-axial fluid collection. Vascular: No hyperdense vessel. Skull: Negative for fracture or focal lesion. Sinuses/Orbits: No acute finding. Other: The mastoid air cells are well aerated. CT CERVICAL SPINE FINDINGS Alignment: No traumatic listhesis. Reversal of the normal cervical lordosis with redemonstrated mild anterolisthesis of C3 on C4, trace retrolisthesis of C5 on C6, and trace anterolisthesis of C7 on T1. Skull base and vertebrae: No acute fracture or suspicious osseous lesion. Soft tissues and spinal canal: No prevertebral fluid or swelling. No visible canal hematoma. Disc levels: Degenerative changes in the cervical spine. Moderate spinal canal stenosis at C5-C6. Multilevel facet and uncovertebral hypertrophy. Upper chest: Negative. IMPRESSION: 1. No acute intracranial process. 2. No acute fracture or traumatic listhesis in the cervical spine. These results were  called by telephone at the time of interpretation on 06/30/2022 at 6:12 pm to provider [facility], who verbally acknowledged these results. Electronically Signed   By: Wiliam Ke M.D.   On: 06/30/2022 18:13   CT Cervical Spine Wo Contrast  Result Date: 06/30/2022 CLINICAL DATA:  Level 1 trauma EXAM: CT HEAD WITHOUT CONTRAST CT CERVICAL SPINE WITHOUT CONTRAST TECHNIQUE: Multidetector CT imaging of the head and cervical spine was performed following the standard protocol without intravenous contrast. Multiplanar CT image reconstructions of the cervical spine were also generated. RADIATION DOSE REDUCTION: This exam was performed according to the departmental dose-optimization program which includes automated exposure control, adjustment of the mA and/or kV according to patient size and/or use of iterative reconstruction technique. COMPARISON:  CT head and cervical spine 03/08/2021 FINDINGS: CT HEAD FINDINGS Brain: No evidence of acute infarct, hemorrhage, mass, mass effect, or midline shift. No hydrocephalus or extra-axial fluid collection. Vascular: No hyperdense vessel. Skull: Negative for fracture or focal lesion. Sinuses/Orbits: No acute finding. Other: The mastoid air cells are well aerated. CT CERVICAL SPINE FINDINGS Alignment: No traumatic listhesis. Reversal of the normal cervical lordosis with redemonstrated mild anterolisthesis of C3 on C4, trace retrolisthesis of C5 on C6, and trace anterolisthesis of C7 on T1. Skull base and vertebrae: No acute fracture or suspicious osseous lesion. Soft tissues and spinal canal: No prevertebral fluid or swelling. No visible canal hematoma. Disc levels: Degenerative changes in the cervical spine. Moderate spinal canal stenosis at C5-C6. Multilevel facet and uncovertebral hypertrophy. Upper chest: Negative. IMPRESSION: 1. No acute intracranial process. 2. No acute fracture or traumatic listhesis in the cervical spine. These results were called by telephone at the time of  interpretation on 06/30/2022 at 6:12 pm to provider [facility], who verbally acknowledged these results. Electronically Signed   By: Wiliam Ke M.D.   On: 06/30/2022 18:13     LOS: 2 days   Jeoffrey Massed, MD  [facility]    To contact the attending provider between 7A-7P or the covering provider during after hours 7P-7A, please log into the web site [email or web address] and access using universal [place] password for that web site. If you do not have the password, please call the hospital operator.  07/02/2022, 11:19 AM

## 2022-07-02 NOTE — Progress Notes (Signed)
   Palliative Medicine Inpatient Follow Up Note  I spoke to patients daughter [name] this late afternoon.  She shares that he has she had the opportunity to speak to her brother and they have both elected to proceed with comfort oriented care for Emelina.  She expresses that he she is trying to get a plane to come down and see her.  A review of comfort care was held again We talked about transition to comfort measures in house and what that would entail inclusive of medications to control pain, dyspnea, agitation, nausea, itching, and hiccups.  We discussed stopping all uneccessary measures such as cardiac monitoring, blood draws, needle sticks, and frequent vital signs.  Jennifer vocalizes awareness and agreement to this.  Additional Time: 35 minutes  __________________________________ Addendum:  Patient's daughter called me this evening to discuss her present clinical state.  I was able to offer emotional support given how shocking this is all to her.  We reviewed that she plans to fly in tomorrow morning and she will be here on Sunday to spend time with Sidda and help her brother with arrangements.  Questions and concerns answered and support provided.  Additional Time: 15  SUMMARY OF RECOMMENDATIONS   DNAR/DNI  Comfort care  Low-dose morphine every 8 hours -additional comfort meds per MAR  Appreciate TOC looking into [facility] as placement  Going palliative support  Additional Time Spent: 50 Billing based on MDM: High ______________________________________________________________________________________ Lamarr Lulas [facility] Palliative Medicine Team Team Cell Phone: [phone] Please utilize secure chat with additional questions, if there is no response within 30 minutes please call the above phone number  Palliative Medicine Team providers are available by phone from 7am to 7pm daily and can be reached through the team cell phone.  Should this patient require  assistance outside of these hours, please call the patient's attending physician.

## 2022-07-02 NOTE — Consult Note (Signed)
Palliative Medicine Inpatient Consult Note  Consulting Provider:  Diamantina Monks, MD   Reason for consult:  Palliative Care Consult Services Palliative Medicine Consult  Reason for Consult? home hospice   07/02/2022  HPI:  Per intake H&P --> [name] [name] is a 77 y.o. female with medical history significant for bipolar disorder, hypothyroidism, CKD 3A, breast cancer, chronic diastolic CHF, presenting to the emergency department after a fall at home.  Palliative care has been asked to get involved to discuss hospice care vs. Palliative supportive care.   Clinical Assessment/Goals of Care:  *Please note that this is a verbal dictation therefore any spelling or grammatical errors are due to the "Dragon Medical One" system interpretation.  I have reviewed medical records including EPIC notes, labs and imaging, received report from bedside RN, assessed the patient who is lying in bed not responsive.   I called patient's son, [name] [name] shared with me that he was busy and about.  I therefore called patient's daughter [name] [name] to further discuss diagnosis prognosis, GOC, EOL wishes, disposition and options.   I introduced Palliative Medicine as specialized medical care for people living with serious illness. It focuses on providing relief from the symptoms and stress of a serious illness. The goal is to improve quality of life for both the patient and the family.  Medical History Review and Understanding:  Review of [name] [name]'s past history inclusive of bipolar disorder, hypothyroidism chronic kidney disease, breast cancer, and diastolic heart failure was held.  Social History:  [name] [name] lives in [place].  She is still worried though her husband suffers from dementia.  She has 2 children a daughter who lives in [place], [name] [name] and his son who lives with her and her home.  She has 2 grandchildren.  She is a woman of faith and practices within Judaism  Functional and  Nutritional State:  Preceding hospitalization [name] [name] had been living with her son.  She had been able to mobilize though over time that had chronically been declining.  She also has had a declining appetite.  Her son is a landscaper therefore has a lot more time in the winter months to help care for her within the spring and summer months.  Advance Directives:  A detailed discussion was had today regarding advanced directives.  Patient does not have formal advanced directives therefore his daughter [name] [name] and son [name] [name] are her surrogate Management consultant.  Code Status:  Concepts specific to code status, artifical feeding and hydration, continued IV antibiotics and rehospitalization was had.  The difference between a aggressive medical intervention path  and a palliative comfort care path for this patient at this time was had.   An established DO NOT RESUSCITATE DO NOT INTUBATE CODE STATUS.  Discussion:  I was able to speak with patient's daughter [name] [name] extensively forwards to how [name] [name] has declined over the past year and 3 months.  Apparently before her hospitalization in February of last year still myelitis she had been and able to participate in B ADLs.  After that admission it was noted that her mental, emotional, physical state deteriorated.  Over the last 6 months it seems like her communication has declined to the point whereby she is only offering water 2 word responses.  While her son has been caring for her at home she has not been thriving from a nutritional standpoint.  I did share openly and honestly that at this point [name] [name] may benefit from comfort and hospice care.  We discussed what  comfort care is which is shifting her focus from curative measures to only treating symptoms.  I shared that that can translate and continue once daily and more to go home.  Patient's daughter worries that her brother would not be able to offer the 24/7 supportive care that day and will need at home  therefore we discussed a hospice home.  I shared that I suspect [name] [name] will meet the criteria though I emphasized the importance of stopping aggressive measures such as artificial hydration.  I shared that her blood pressures are soft as are her blood glucose levels which are normal as we are progressing to the end of her life.  We reviewed the idea of not extending the bodies natural processes.  While this was very upsetting for [name] [name] she does share that her mom has lost her will to live and is quite different than she was in 2021 when she was clearly "fighting" in the setting of her breast cancer diagnosis.  [name] [name] would like to take some time to speak to her brother and identify a meeting time.  At this point she shares she cannot stop her day as she has a Tax adviser in [place].  We discussed the importance of continue conversations and again I emphasized strongly the idea of keeping diet comfortable with at this stage of her life  [name] [name] shares she would appreciate a comprehensive medical update from the physician team as she had not had the opportunity to speak with any of the medical doctors other than the ER physician since her mother's admission.  I shared that I would reach out to Dr. Jerral Ralph in an effort to get her a update.  Discussed the importance of continued conversation with family and their  medical providers regarding overall plan of care and treatment options, ensuring decisions are within the context of the patients values and GOCs.  Decision Maker: [name],[name] Daughter [phone]  [phone]    [name], [name] [phone]  [phone]   SUMMARY OF RECOMMENDATIONS   DNAR/DNI  Ongoing conversations regarding patient's present health state and overall decline --> trying to arrange a family meeting this will likely need to be held by telephone  Recommended strongly to patient's daughter the idea of comfort oriented care  Palliative care will  continue to follow   Code Status/Advance Care Planning: DNAR/DNI  Palliative Prophylaxis:  Aspiration, Bowel Regimen, Delirium Protocol, Frequent Pain Assessment, Oral Care, Palliative Wound Care, and Turn Reposition  Additional Recommendations (Limitations, Scope, Preferences): Continue present care  Psycho-social/Spiritual:  Desire for further Chaplaincy support: Yes Additional Recommendations: Education on end-of-life processes   Prognosis: Limited to days-weeks if additional interventions are stopped and focus shifts to comfort  Discharge Planning: Discharge plan is not certain at this time.   Vitals:   07/02/22 0200 07/02/22 0400  BP: (!) 98/51 (!) 97/56  Pulse: 80 85  Resp: 17 10  Temp:  97.9 F (36.6 C)  SpO2: 97% 96%    Intake/Output Summary (Last 24 hours) at 07/02/2022 0720 Last data filed at 07/02/2022 2841 Gross per 24 hour  Intake 220.79 ml  Output 832 ml  Net -611.21 ml   Last Weight  Most recent update: 07/02/2022  4:51 AM    Weight  47.6 kg (104 lb 15 oz)            Gen: Elderly Caucasian female in no acute distress HEENT: Bandages on the left side of the face, dry mucous membranes CV: Regular rate and rhythm  PULM:  On room air breathing is even and unlabored ABD: soft/nontender  EXT: No edema  Neuro: Somnolent  PPS: 20%   This conversation/these recommendations were discussed with patient primary care team, Dr. Jerral Ralph   Total Time: 7 Billing based on MDM: High  Problems Addressed: One acute or chronic illness or injury that poses a threat to life or bodily function  Amount and/or Complexity of Data: Category 3:Discussion of management or test interpretation with external physician/other qualified health care professional/appropriate source (not separately reported)  Risks: Parenteral controlled substances and Decision not to resuscitate or to de-escalate care because of poor  prognosis ______________________________________________________ [name] [name] Palliative Medicine Team Team Cell Phone: [phone] Please utilize secure chat with additional questions, if there is no response within 30 minutes please call the above phone number  Palliative Medicine Team providers are available by phone from 7am to 7pm daily and can be reached through the team cell phone.  Should this patient require assistance outside of these hours, please call the patient's attending physician.

## 2022-07-02 NOTE — Inpatient Diabetes Management (Signed)
Inpatient Diabetes Program Recommendations  AACE/ADA: New Consensus Statement on Inpatient Glycemic Control (2015)  Target Ranges:  Prepandial:   less than 140 mg/dL      Peak postprandial:   less than 180 mg/dL (1-2 hours)      Critically ill patients:  140 - 180 mg/dL   Lab Results  Component Value Date   GLUCAP 118 (H) 07/02/2022   HGBA1C 5.4 07/01/2022    Review of Glycemic Control  Latest Reference Range & Units 07/01/22 11:39 07/01/22 15:34 07/01/22 21:13 07/02/22 07:38  Glucose-Capillary 70 - 99 mg/dL 098 (H) 62 (L) 87 119 (H)  (H): Data is abnormally high (L): Data is abnormally low Diabetes history: No DM Outpatient Diabetes medications: none Current orders for Inpatient glycemic control: Novolog 0-6 units TID & HS  Inpatient Diabetes Program Recommendations:   Noted patient experienced hypoglycemia yesterday following Novolog 1 units.  Consider discontinuing Novolog 0-6 units TID & HS.   Thanks, Lujean Rave, MSN, RNC-OB Diabetes Coordinator [phone] (8a-5p)

## 2022-07-03 DIAGNOSIS — N179 Acute kidney failure, unspecified: Secondary | ICD-10-CM | POA: Diagnosis not present

## 2022-07-03 DIAGNOSIS — N1831 Chronic kidney disease, stage 3a: Secondary | ICD-10-CM | POA: Diagnosis not present

## 2022-07-03 LAB — CULTURE, BLOOD (ROUTINE X 2)

## 2022-07-03 MED ORDER — HYDROMORPHONE HCL 1 MG/ML IJ SOLN
0.5000 mg | Freq: Four times a day (QID) | INTRAMUSCULAR | Status: DC
  Administered 2022-07-03 – 2022-07-06 (×13): 0.5 mg via INTRAVENOUS
  Filled 2022-07-03 (×12): qty 0.5

## 2022-07-03 NOTE — Progress Notes (Signed)
PROGRESS NOTE        PATIENT DETAILS Name: [name] [name] Age: 77 y.o. Sex: female Date of Birth: June 14, 1945 Admit Date: 06/30/2022 Admitting Physician Briscoe Deutscher, MD [id number], [facility]  Brief Summary: Patient is a 77 y.o.  female with chronic HFpEF, dementia, CKD stage IIIa, hypothyroidism, bipolar disorder who presented with a mechanical fall-found to have AKI and subsequently admitted to the hospitalist service.  Significant events: 5/15>> admit to [facility]   Significant studies: 5/15>> CXR: No PNA 5/15>> x-ray pelvis: Slight deformity of left superior pubic rami-subtle fracture is possible. 5/15>> CT head: No acute intracranial process 5/15>> CT C-spine: No fracture or traumatic listhesis 5/16>> CT pelvis: No acute fracture 5/16>> renal ultrasound: No hydronephrosis  Significant microbiology data: 5/15>> blood culture: No growth 5/15>> RSV/influenza/COVID PCR: Negative  Procedures: None  Consults: None  Subjective: Awake but confused, denies any headache or chest pain.  Objective: Vitals: Blood pressure (!) 98/48, pulse 64, temperature 98 F (36.7 C), temperature source Axillary, resp. rate 12, height 5\' 6"  (1.676 m), weight 47.6 kg, SpO2 98 %.   Exam:  Awake and confused, moves all 4 extremities, bandage on the left side of her face [place].AT,PERRAL Supple Neck, No JVD,   Symmetrical Chest wall movement, Good air movement bilaterally, CTAB RRR,No Gallops, Rubs or new Murmurs,  +ve B.Sounds, Abd Soft, No tenderness,   No Cyanosis, Clubbing or edema    Assessment/Plan:  Note after detailed discussions between family and palliative care on 07/02/2022 patient has now been transition to full comfort care, she is DNR.  We await residential hospice bed.  Only comfort medications on board.  Other active medical issues addressed this admission previously prior to transitioning to comfort measures as below.   AKI on CKD stage  IIIa Suspect hemodynamically mediated-slowly improving UA negative for significant proteinuria, CK normal, renal ultrasound negative for hydronephrosis Renal function improving with supportive care Continue to check frequent scans. Avoid nephrotoxic agents Follow electrolytes.  Anion gap metabolic acidosis Suspect due to AKI Resolved with bicarb infusion  Hypokalemia Replete/recheck  Fall Skin tear/left facial trauma Imaging as above Appreciate trauma input Received Tdap in the ED Local wound care PT/OT  SIRS No infectious cause apparent Cultures negative so far UA negative for UTI Extensive imaging negative for any obvious infection Given hemodynamic stability-being monitored off antibiotics.  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   Continue gentle IV fluid hydration  Chronic HFpEF Euvolemic on exam Furosemide on hold due to AKI  HLD Hold statin for now  DM-2 (A1c 5.4 on 5/16) CBG stable with SSI  Recent Labs    07/01/22 2113 07/02/22 0738 07/02/22 1113  GLUCAP 87 118* 168*    Hypothyroidism Synthroid  History of breast cancer Resume anastrozole  Dementia with delirium Delirium precautions  Addendum Previous Spoke with daughter-Jennifer [phone]- DNR  BMI: Estimated body mass index is 16.94 kg/m as calculated from the following:   Height as of this encounter: 5\' 6"  (1.676 m).   Weight as of this encounter: 47.6 kg.   Code status:   Code Status: DNR   DVT Prophylaxis:     Family Communication:  SonJanaya Triola- [phone] left voicemail on 5/17.   Disposition Plan: Status is: Inpatient Remains inpatient appropriate because: Severity of illness    Planned Discharge Destination:Home health   Diet: Diet Order             Diet regular Room service appropriate? Yes; Fluid consistency: Thin  Diet effective now                     Antimicrobial agents: Anti-infectives (From admission, onward)    Start     Dose/Rate Route Frequency Ordered Stop   07/01/22 1800  ceFEPIme (MAXIPIME) 2 g in sodium chloride 0.9 % 100 mL IVPB  Status:  Discontinued        2 g 200 mL/hr over 30 Minutes Intravenous Every 24 hours 06/30/22 2017 07/01/22 0950   07/01/22 0530  metroNIDAZOLE (FLAGYL) IVPB 500 mg  Status:  Discontinued        500 mg 100 mL/hr over 60 Minutes Intravenous Every 12 hours 06/30/22 2217 07/01/22 0950   06/30/22 2020  vancomycin variable dose per unstable renal function (pharmacist dosing)  Status:  Discontinued         Does not apply See admin instructions 06/30/22 2020 07/01/22 0950   06/30/22 1745  vancomycin (VANCOREADY) IVPB 1250 mg/250 mL        1,250 mg 166.7 mL/hr over 90 Minutes Intravenous  Once 06/30/22 1737 06/30/22 2252   06/30/22 1730  ceFEPIme (MAXIPIME) 2 g in sodium chloride 0.9 % 100 mL IVPB        2 g 200 mL/hr over 30 Minutes Intravenous  Once 06/30/22 1722 06/30/22 2152   06/30/22 1730  metroNIDAZOLE (FLAGYL) IVPB 500 mg        500 mg 100 mL/hr over 60 Minutes Intravenous  Once 06/30/22 1722 07/01/22 0128   06/30/22 1730  vancomycin (VANCOCIN) IVPB 1000 mg/200 mL premix  Status:  Discontinued        1,000 mg 200 mL/hr over 60 Minutes Intravenous  Once 06/30/22 1722 06/30/22 1737        MEDICATIONS: Scheduled Meds:   HYDROmorphone (DILAUDID) injection  0.5 mg Intravenous Q8H   Continuous Infusions:   PRN Meds:.acetaminophen **OR** acetaminophen, antiseptic oral rinse, HYDROmorphone (DILAUDID) injection, LORazepam, ondansetron (ZOFRAN) IV, polyvinyl alcohol   I have personally reviewed following labs and imaging studies  LABORATORY DATA: CBC: Recent Labs  Lab 06/30/22 2050  06/30/22 2115 07/01/22 0702 07/02/22 0612  WBC 16.3*  --  12.3* 9.8  NEUTROABS 14.9*  --   --   --   HGB 15.8* 16.0* 12.7 9.9*  HCT 49.1* 47.0* 37.8 30.3*  MCV 93.3  --  91.5 92.7  PLT 223  --  173 127*    Basic Metabolic Panel: Recent Labs  Lab 06/30/22 1904 06/30/22 2050 06/30/22 2115 07/01/22 0702 07/02/22 0612  NA 140 142 142 139 143  K 4.7 4.7 4.4 4.7 3.1*  CL 105 100 103 98 108  CO2 12* 22  --  27 23  GLUCOSE 217* 197* 189* 144* 76  BUN 69* 64* 59* 65* 52*  CREATININE 2.62* 2.63* 2.70* 2.43* 2.00*  CALCIUM 9.1 9.5  --  8.3* 7.8*  MG  --   --   --  2.3  --   PHOS  --   --   --  3.3  --     GFR: Estimated Creatinine Clearance: 17.7 mL/min (A) (by C-G formula based on SCr of 2 mg/dL (H)).  Liver Function Tests: Recent Labs  Lab 06/30/22 1904 06/30/22 2050  AST 39 32  ALT QUANTITY NOT SUFFICIENT, UNABLE TO PERFORM TEST 21  ALKPHOS 70 81  BILITOT QUANTITY NOT SUFFICIENT, UNABLE TO PERFORM TEST 1.3*  PROT 6.0* 6.5  ALBUMIN 3.1* 3.3*   No results for input(s): "LIPASE", "AMYLASE" in the last 168 hours. No results for input(s): "AMMONIA" in the last 168 hours.  Coagulation Profile: No results for input(s): "INR", "PROTIME" in the last 168 hours.  Cardiac Enzymes: Recent Labs  Lab 07/01/22 0700  CKTOTAL 126    BNP (last 3 results) No results for input(s): "PROBNP" in the last 8760 hours.  Lipid Profile: No results for input(s): "CHOL", "HDL", "LDLCALC", "TRIG", "CHOLHDL", "LDLDIRECT" in the last 72 hours.  Thyroid Function Tests: Recent Labs    06/30/22 2050  TSH 4.633*    Anemia Panel: No results for input(s): "VITAMINB12", "FOLATE", "FERRITIN", "TIBC", "IRON", "RETICCTPCT" in the last 72 hours.  Urine analysis:    Component Value Date/Time   COLORURINE YELLOW 07/01/2022 2300   APPEARANCEUR CLEAR 07/01/2022 2300   LABSPEC 1.010 07/01/2022 2300   PHURINE 5.0 07/01/2022 2300   GLUCOSEU NEGATIVE 07/01/2022 2300   HGBUR NEGATIVE 07/01/2022  2300   BILIRUBINUR NEGATIVE 07/01/2022 2300   KETONESUR NEGATIVE 07/01/2022 2300   PROTEINUR 30 (A) 07/01/2022 2300   UROBILINOGEN 0.2 11/09/2013 1143   NITRITE NEGATIVE 07/01/2022 2300   LEUKOCYTESUR TRACE (A) 07/01/2022 2300    Sepsis Labs: Lactic Acid, Venous    Component Value Date/Time   LATICACIDVEN 1.9 07/01/2022 1610   RADIOLOGY STUDIES/RESULTS: CT PELVIS WO CONTRAST  Result Date: 07/01/2022 CLINICAL DATA:  Hip trauma with fracture suspected. Question pubic fracture. EXAM: CT PELVIS WITHOUT CONTRAST TECHNIQUE: Multidetector CT imaging of the pelvis was performed following the standard protocol without intravenous contrast. RADIATION DOSE REDUCTION: This exam was performed according to the departmental dose-optimization program which includes automated exposure control, adjustment of the mA and/or kV according to patient size and/or use of iterative reconstruction technique. COMPARISON:  Radiograph from yesterday FINDINGS: Remote fractures with completed healing and sclerosis at the right medial pubic bone and involving the left superior and inferior pubic rami. Remote fracture with band of sclerosis at the left sacral ala and crossing the sacral body. Both hips are intact and located. No acute soft tissue finding. Mild degenerative spurring at the hips. IMPRESSION: Remote bilateral pubic bone and sacral insufficiency fractures. No acute fracture. Electronically Signed   By: Tiburcio Pea M.D.   On: 07/01/2022 15:53   US RENAL  Result Date: 07/01/2022 CLINICAL DATA:  [id number] AKI (acute kidney injury) (HCC) [id number] EXAM: RENAL / URINARY TRACT ULTRASOUND COMPLETE COMPARISON:  None Available. FINDINGS: Right Kidney: Renal measurements: 8.9 x 5.0 x 5.5 cm = volume: 126.4 mL. Two simple cysts measuring up to 2.3 and 2.9 cm respectively, no follow-up imaging is recommended. No hydronephrosis. Echogenic renal cortex with cortical thinning. Left Kidney: Renal measurements: 7.2 x 4.2 x 3.1 cm =  volume: 48.3 mL. Two simple cysts measuring up to 1.2 and 1.0 cm respectively, no follow-up imaging is recommended. Echogenic cortex with cortical thinning. No hydronephrosis. Bladder: Appears normal for degree of bladder distention. Other: None. IMPRESSION: No hydronephrosis. Echogenic renal cortex bilaterally, as can be seen in medical renal disease. Electronically Signed   By: Caprice Renshaw M.D.   On: 07/01/2022 11:42     LOS: 3 days   Signature  -    Susa Raring M.D on 07/03/2022 at  8:06 AM   -  To page go to [email or web address]

## 2022-07-03 NOTE — Progress Notes (Signed)
   Palliative Medicine Inpatient Follow Up Note HPI: [name] [name] is a 77 y.o. female with medical history significant for bipolar disorder, hypothyroidism, CKD 3A, breast cancer, chronic diastolic CHF, presenting to the emergency department after a fall at home.   Palliative care has been asked to get involved to discuss hospice care vs. Palliative supportive care  Today's Discussion 07/03/2022  *Please note that this is a verbal dictation therefore any spelling or grammatical errors are due to the "Dragon Medical One" system interpretation.  Chart reviewed inclusive of vital signs, progress notes, laboratory results, and diagnostic images.   I met with [name] [name] at bedside this morning.  She opened her eyes and acknowledged my presence though did not speak to me.   I called patients son, [name] [name] -created space and opportunity for patient to explore thoughts feelings and fears regarding his mothers current medical situation. He shares that he has seen her today and realizes that she is dying.  We discussed the idea of transfer to an inpatient hospice though at this time patients son would like her to remain inpatient.   Questions and concerns addressed/Palliative Support Provided.   Objective Assessment: Vital Signs Vitals:   07/02/22 1952 07/03/22 0745  BP: (!) 104/51 (!) 98/48  Pulse:    Resp: 17 12  Temp: 97.7 F (36.5 C) 98 F (36.7 C)  SpO2:  98%    Intake/Output Summary (Last 24 hours) at 07/03/2022 1240 Last data filed at 07/02/2022 1247 Gross per 24 hour  Intake 100 ml  Output --  Net 100 ml   Last Weight  Most recent update: 07/02/2022  4:51 AM    Weight  47.6 kg (104 lb 15 oz)            Gen: Elderly Caucasian female in no acute distress HEENT: Bandages on the left side of the face, dry mucous membranes CV: Regular rate and rhythm  PULM: On room air breathing is even and unlabored ABD: soft/nontender  EXT: No edema  Neuro: Somnolent  SUMMARY OF  RECOMMENDATIONS   DNAR/DNI   Comfort care   Increase Low-dose morphine to every 6 hours - additional comfort meds per [facility]   As of presently family would like to defer placement at a facility   Going palliative support  Billing based on MDM: High ______________________________________________________________________________________ Lamarr Lulas [place] Palliative Medicine Team Team Cell Phone: [phone] Please utilize secure chat with additional questions, if there is no response within 30 minutes please call the above phone number  Palliative Medicine Team providers are available by phone from 7am to 7pm daily and can be reached through the team cell phone.  Should this patient require assistance outside of these hours, please call the patient's attending physician.

## 2022-07-04 DIAGNOSIS — N179 Acute kidney failure, unspecified: Secondary | ICD-10-CM | POA: Diagnosis not present

## 2022-07-04 DIAGNOSIS — N1831 Chronic kidney disease, stage 3a: Secondary | ICD-10-CM | POA: Diagnosis not present

## 2022-07-04 NOTE — Progress Notes (Signed)
Civil engineer, contracting [facility]) Hospital Liaison Note  [facility] liaison spoke with patient's daughtr [name] to discuss hospice. At this time, per primary MD patient is not IPU appropriate as she is not having any symptoms and is not ultimately EOL at this time.   [name] states that she would like the patient to stay where she is currently and if she should meet IPU criteria this is something we will discuss at that time.   Primary team updated.   Lynder Parents [facility] Liaison [phone]

## 2022-07-04 NOTE — Progress Notes (Signed)
PROGRESS NOTE        PATIENT DETAILS Name: [name] [name] Age: 77 y.o. Sex: female Date of Birth: [birth year] Admit Date: 06/30/2022 Admitting Physician Briscoe Deutscher, MD UJW:[id number], [facility]  Brief Summary: Patient is a 77 y.o.  female with chronic HFpEF, dementia, CKD stage IIIa, hypothyroidism, bipolar disorder who presented with a mechanical fall-found to have AKI and subsequently admitted to the hospitalist service.  Significant events: 5/15>> admit to [facility]   Significant studies: 5/15>> CXR: No PNA 5/15>> x-ray pelvis: Slight deformity of left superior pubic rami-subtle fracture is possible. 5/15>> CT head: No acute intracranial process 5/15>> CT C-spine: No fracture or traumatic listhesis 5/16>> CT pelvis: No acute fracture 5/16>> renal ultrasound: No hydronephrosis  Significant microbiology data: 5/15>> blood culture: No growth 5/15>> RSV/influenza/COVID PCR: Negative  Procedures: None  Consults: None  Subjective: Awake but confused, denies any headache or chest pain.  Objective: Vitals: Blood pressure (!) 122/51, pulse 64, temperature 97.6 F (36.4 C), temperature source Axillary, resp. rate (!) 5, height 5\' 6"  (1.676 m), weight 47.6 kg, SpO2 98 %.   Exam:  Awake and confused, moves all 4 extremities, bandage on the left side of her face [place].AT,PERRAL Supple Neck, No JVD,   Symmetrical Chest wall movement, Good air movement bilaterally, CTAB RRR,No Gallops, Rubs or new Murmurs,  +ve B.Sounds, Abd Soft, No tenderness,   No Cyanosis, Clubbing or edema    Assessment/Plan:  Note after detailed discussions between family and palliative care on 07/02/2022 patient has now been transition to full comfort care, she is DNR.  We await residential hospice bed.  Only comfort medications on board.  Other active medical issues addressed this admission previously prior to transitioning to comfort measures as below.   AKI on CKD  stage IIIa Suspect hemodynamically mediated-slowly improving UA negative for significant proteinuria, CK normal, renal ultrasound negative for hydronephrosis Renal function improving with supportive care Continue to check frequent scans. Avoid nephrotoxic agents Follow electrolytes.  Anion gap metabolic acidosis Suspect due to AKI Resolved with bicarb infusion  Hypokalemia Replete/recheck  Fall Skin tear/left facial trauma Imaging as above Appreciate trauma input Received Tdap in the ED Local wound care PT/OT  SIRS No infectious cause apparent Cultures negative so far UA negative for UTI Extensive imaging negative for any obvious infection Given hemodynamic stability-being monitored off antibiotics.  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   Continue gentle IV fluid hydration  Chronic HFpEF Euvolemic on exam Furosemide on hold due to AKI  HLD Hold statin for now  DM-2 (A1c 5.4 on 5/16) CBG stable with SSI  Recent Labs    07/01/22 2113 07/02/22 0738 07/02/22 1113  GLUCAP 87 118* 168*    Hypothyroidism Synthroid  History of breast cancer Resume anastrozole  Dementia with delirium Delirium precautions  Addendum Previous Spoke with daughter-Jennifer [phone] DNR  BMI: Estimated body mass index is 16.94 kg/m as calculated from the following:   Height as of this encounter: 5\' 6"  (1.676 m).   Weight as of this encounter: 47.6 kg.   Code status:   Code Status: DNR   DVT Prophylaxis:     Family  Communication: SonJanvi Munafo- [phone] left voicemail on 5/17.   Disposition Plan: Status is: Inpatient Remains inpatient appropriate because: Severity of  illness   Planned Discharge Destination:Home health   Diet: Diet Order             Diet regular Room service appropriate? Yes; Fluid consistency: Thin  Diet effective now                     Antimicrobial agents: Anti-infectives (From admission, onward)    Start     Dose/Rate Route Frequency Ordered Stop   07/01/22 1800  ceFEPIme (MAXIPIME) 2 g in sodium chloride 0.9 % 100 mL IVPB  Status:  Discontinued        2 g 200 mL/hr over 30 Minutes Intravenous Every 24 hours 06/30/22 2017 07/01/22 0950   07/01/22 0530  metroNIDAZOLE (FLAGYL) IVPB 500 mg  Status:  Discontinued        500 mg 100 mL/hr over 60 Minutes Intravenous Every 12 hours 06/30/22 2217 07/01/22 0950   06/30/22 2020  vancomycin variable dose per unstable renal function (pharmacist dosing)  Status:  Discontinued         Does not apply See admin instructions 06/30/22 2020 07/01/22 0950   06/30/22 1745  vancomycin (VANCOREADY) IVPB 1250 mg/250 mL        1,250 mg 166.7 mL/hr over 90 Minutes Intravenous  Once 06/30/22 1737 06/30/22 2252   06/30/22 1730  ceFEPIme (MAXIPIME) 2 g in sodium chloride 0.9 % 100 mL IVPB        2 g 200 mL/hr over 30 Minutes Intravenous  Once 06/30/22 1722 06/30/22 2152   06/30/22 1730  metroNIDAZOLE (FLAGYL) IVPB 500 mg        500 mg 100 mL/hr over 60 Minutes Intravenous  Once 06/30/22 1722 07/01/22 0128   06/30/22 1730  vancomycin (VANCOCIN) IVPB 1000 mg/200 mL premix  Status:  Discontinued        1,000 mg 200 mL/hr over 60 Minutes Intravenous  Once 06/30/22 1722 06/30/22 1737        MEDICATIONS: Scheduled Meds:   HYDROmorphone (DILAUDID) injection  0.5 mg Intravenous Q6H   Continuous Infusions:   PRN Meds:.acetaminophen **OR** acetaminophen, antiseptic oral rinse, HYDROmorphone (DILAUDID) injection, LORazepam, ondansetron (ZOFRAN) IV, polyvinyl alcohol   I have personally reviewed following labs and imaging studies  LABORATORY DATA: CBC: Recent Labs  Lab 06/30/22 2050  06/30/22 2115 07/01/22 0702 07/02/22 0612  WBC 16.3*  --  12.3* 9.8  NEUTROABS 14.9*  --   --   --   HGB 15.8* 16.0* 12.7 9.9*  HCT 49.1* 47.0* 37.8 30.3*  MCV 93.3  --  91.5 92.7  PLT 223  --  173 127*    Basic Metabolic Panel: Recent Labs  Lab 06/30/22 1904 06/30/22 2050 06/30/22 2115 07/01/22 0702 07/02/22 0612  NA 140 142 142 139 143  K 4.7 4.7 4.4 4.7 3.1*  CL 105 100 103 98 108  CO2 12* 22  --  27 23  GLUCOSE 217* 197* 189* 144* 76  BUN 69* 64* 59* 65* 52*  CREATININE 2.62* 2.63* 2.70* 2.43* 2.00*  CALCIUM 9.1 9.5  --  8.3* 7.8*  MG  --   --   --  2.3  --   PHOS  --   --   --  3.3  --     GFR: Estimated Creatinine Clearance: 17.7 mL/min (A) (by C-G formula based on SCr of 2 mg/dL (H)).  Liver Function Tests: Recent Labs  Lab 06/30/22 1904 06/30/22 2050  AST 39 32  ALT QUANTITY NOT SUFFICIENT, UNABLE TO PERFORM TEST 21  ALKPHOS 70 81  BILITOT QUANTITY NOT SUFFICIENT, UNABLE TO PERFORM TEST 1.3*  PROT 6.0* 6.5  ALBUMIN 3.1* 3.3*   No results for input(s): "LIPASE", "AMYLASE" in the last 168 hours. No results for input(s): "AMMONIA" in the last 168 hours.  Coagulation Profile: No results for input(s): "INR", "PROTIME" in the last 168 hours.  Cardiac Enzymes: Recent Labs  Lab 07/01/22 0700  CKTOTAL 126    BNP (last 3 results) No results for input(s): "PROBNP" in the last 8760 hours.  Lipid Profile: No results for input(s): "CHOL", "HDL", "LDLCALC", "TRIG", "CHOLHDL", "LDLDIRECT" in the last 72 hours.  Thyroid Function Tests: No results for input(s): "TSH", "T4TOTAL", "FREET4", "T3FREE", "THYROIDAB" in the last 72 hours.   Anemia Panel: No results for input(s): "VITAMINB12", "FOLATE", "FERRITIN", "TIBC", "IRON", "RETICCTPCT" in the last 72 hours.  Urine analysis:    Component Value Date/Time   COLORURINE YELLOW 07/01/2022 2300   APPEARANCEUR CLEAR 07/01/2022 2300   LABSPEC 1.010 07/01/2022 2300   PHURINE 5.0 07/01/2022 2300   GLUCOSEU  NEGATIVE 07/01/2022 2300   HGBUR NEGATIVE 07/01/2022 2300   BILIRUBINUR NEGATIVE 07/01/2022 2300   KETONESUR NEGATIVE 07/01/2022 2300   PROTEINUR 30 (A) 07/01/2022 2300   UROBILINOGEN 0.2 11/09/2013 1143   NITRITE NEGATIVE 07/01/2022 2300   LEUKOCYTESUR TRACE (A) 07/01/2022 2300    Sepsis Labs: Lactic Acid, Venous    Component Value Date/Time   LATICACIDVEN 1.9 07/01/2022 0702   RADIOLOGY STUDIES/RESULTS: No results found.   LOS: 4 days   Signature  -    Susa Raring M.D on 07/04/2022 at 7:49 AM   -  To page go to [email or web address]

## 2022-07-04 NOTE — Progress Notes (Signed)
   Palliative Medicine Inpatient Follow Up Note HPI: [name] [name] is a 77 y.o. female with medical history significant for bipolar disorder, hypothyroidism, CKD 3A, breast cancer, chronic diastolic CHF, presenting to the emergency department after a fall at home.   Palliative care has been asked to get involved to discuss hospice care vs. Palliative supportive care.  Today's Discussion 07/04/2022  *Please note that this is a verbal dictation therefore any spelling or grammatical errors are due to the "Dragon Medical One" system interpretation.  Chart reviewed inclusive of vital signs, progress notes, laboratory results, and diagnostic images.   I met with [name] at bedside this morning.  She is sitting at a 45 degree angle with her eyes open. I introduced myself and it seemed that she attempted to [facility] some words.   I called and spoke extensively with patients daughter, [name] regarding patients present clinical state and what to anticipate moving forward. We reviewed that at this time [name] is stable for transfer to an outside facility. Patients daughter would like to speak to the MD as well as [name] of hospice to better understand patients timeline. I have shared we may be looking at weeks or perhaps less though we never know for certain. At this time patients daughter is adamant against a nursing home.   Questions and concerns addressed/Palliative Support Provided.   Objective Assessment: Vital Signs Vitals:   07/03/22 0745 07/03/22 1950  BP: (!) 98/48 (!) 122/51  Pulse:    Resp: 12 (!) 5  Temp: 98 F (36.7 C) 97.6 F (36.4 C)  SpO2: 98%     Intake/Output Summary (Last 24 hours) at 07/04/2022 1025 Last data filed at 07/04/2022 0600 Gross per 24 hour  Intake --  Output 400 ml  Net -400 ml    Last Weight  Most recent update: 07/02/2022  4:51 AM    Weight  47.6 kg (104 lb 15 oz)            Gen: Elderly Caucasian female in no acute distress HEENT: Bandages on the  left side of the face, dry mucous membranes CV: Regular rate and rhythm  PULM: On room air breathing is even and unlabored ABD: soft/nontender  EXT: No edema  Neuro: Somnolent  SUMMARY OF RECOMMENDATIONS   DNAR/DNI   Comfort care  Continue Low-dose morphine to every 6 hours - additional comfort meds per MAR   TOC to help with placement options   Going palliative support  Billing based on MDM: High ______________________________________________________________________________________ Lamarr Lulas [place] Palliative Medicine Team Team Cell Phone: [phone] Please utilize secure chat with additional questions, if there is no response within 30 minutes please call the above phone number  Palliative Medicine Team providers are available by phone from 7am to 7pm daily and can be reached through the team cell phone.  Should this patient require assistance outside of these hours, please call the patient's attending physician.

## 2022-07-05 DIAGNOSIS — N179 Acute kidney failure, unspecified: Secondary | ICD-10-CM | POA: Diagnosis not present

## 2022-07-05 DIAGNOSIS — F03C0 Unspecified dementia, severe, without behavioral disturbance, psychotic disturbance, mood disturbance, and anxiety: Secondary | ICD-10-CM

## 2022-07-05 DIAGNOSIS — N1831 Chronic kidney disease, stage 3a: Secondary | ICD-10-CM | POA: Diagnosis not present

## 2022-07-05 LAB — CULTURE, BLOOD (ROUTINE X 2)
Culture: NO GROWTH
Culture: NO GROWTH

## 2022-07-05 NOTE — Care Management Important Message (Signed)
Important Message  Patient Details  Name: [name] [name] MRN: [id number] Date of Birth: 11-08-1945   Medicare Important Message Given:  Yes     Mardene Sayer 07/05/2022, 2:07 PM

## 2022-07-05 NOTE — Progress Notes (Signed)
PROGRESS NOTE        PATIENT DETAILS Name: [name] [name] Age: 77 y.o. Sex: female Date of Birth: 10-07-1945 Admit Date: 06/30/2022 Admitting Physician Briscoe Deutscher, MD [id number], [facility]  Brief Summary: Patient is a 77 y.o.  female with chronic HFpEF, dementia, CKD stage IIIa, hypothyroidism, bipolar disorder who presented with a mechanical fall-found to have AKI and subsequently admitted to the hospitalist service.  Significant events: 5/15>> admit to [facility]   Significant studies: 5/15>> CXR: No PNA 5/15>> x-ray pelvis: Slight deformity of left superior pubic rami-subtle fracture is possible. 5/15>> CT head: No acute intracranial process 5/15>> CT C-spine: No fracture or traumatic listhesis 5/16>> CT pelvis: No acute fracture 5/16>> renal ultrasound: No hydronephrosis  Significant microbiology data: 5/15>> blood culture: No growth 5/15>> RSV/influenza/COVID PCR: Negative  Procedures: None  Consults: None  Subjective: Awake but confused, denies any headache or chest pain.  Objective: Vitals: Blood pressure (!) 132/103, pulse 82, temperature 97.6 F (36.4 C), temperature source Oral, resp. rate 16, height 5\' 6"  (1.676 m), weight 47.6 kg, SpO2 96 %.   Exam:  Awake and confused, moves all 4 extremities, bandage on the left side of her face [place].AT,PERRAL Supple Neck, No JVD,   Symmetrical Chest wall movement, Good air movement bilaterally, CTAB RRR,No Gallops, Rubs or new Murmurs,  +ve B.Sounds, Abd Soft, No tenderness,   No Cyanosis, Clubbing or edema    Assessment/Plan:  Note after detailed discussions between family and palliative care on 07/02/2022 patient has now been transition to full comfort care, she is DNR.  We await residential hospice bed.  Only comfort medications on board.  Other active medical issues addressed this admission previously prior to transitioning to comfort measures as below.   AKI on CKD stage  IIIa Suspect hemodynamically mediated-slowly improving UA negative for significant proteinuria, CK normal, renal ultrasound negative for hydronephrosis Renal function improving with supportive care Continue to check frequent scans. Avoid nephrotoxic agents Follow electrolytes.  Anion gap metabolic acidosis Suspect due to AKI Resolved with bicarb infusion  Hypokalemia Replete/recheck  Fall Skin tear/left facial trauma Imaging as above Appreciate trauma input Received Tdap in the ED Local wound care PT/OT  SIRS No infectious cause apparent Cultures negative so far UA negative for UTI Extensive imaging negative for any obvious infection Given hemodynamic stability-being monitored off antibiotics.  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   Continue gentle IV fluid hydration  Chronic HFpEF Euvolemic on exam Furosemide on hold due to AKI  HLD Hold statin for now  DM-2 (A1c 5.4 on 5/16) CBG stable with SSI  Recent Labs    07/02/22 1113  GLUCAP 168*    Hypothyroidism Synthroid  History of breast cancer Resume anastrozole  Dementia with delirium Delirium precautions  Addendum Previous Spoke with daughter-Jennifer [phone] DNR  BMI: Estimated body mass index is 16.94 kg/m as calculated from the following:   Height as of this encounter: 5\' 6"  (1.676 m).   Weight as of this encounter: 47.6 kg.   Code status:   Code Status: DNR   DVT Prophylaxis:     Family Communication: SonPeighton Gartenberg- [phone] left voicemail  on 5/17.   Disposition Plan: Status is: Inpatient Remains inpatient appropriate because: Severity of illness   Planned Discharge Destination:Home  health   Diet: Diet Order             Diet regular Room service appropriate? Yes; Fluid consistency: Thin  Diet effective now                     Antimicrobial agents: Anti-infectives (From admission, onward)    Start     Dose/Rate Route Frequency Ordered Stop   07/01/22 1800  ceFEPIme (MAXIPIME) 2 g in sodium chloride 0.9 % 100 mL IVPB  Status:  Discontinued        2 g 200 mL/hr over 30 Minutes Intravenous Every 24 hours 06/30/22 2017 07/01/22 0950   07/01/22 0530  metroNIDAZOLE (FLAGYL) IVPB 500 mg  Status:  Discontinued        500 mg 100 mL/hr over 60 Minutes Intravenous Every 12 hours 06/30/22 2217 07/01/22 0950   06/30/22 2020  vancomycin variable dose per unstable renal function (pharmacist dosing)  Status:  Discontinued         Does not apply See admin instructions 06/30/22 2020 07/01/22 0950   06/30/22 1745  vancomycin (VANCOREADY) IVPB 1250 mg/250 mL        1,250 mg 166.7 mL/hr over 90 Minutes Intravenous  Once 06/30/22 1737 06/30/22 2252   06/30/22 1730  ceFEPIme (MAXIPIME) 2 g in sodium chloride 0.9 % 100 mL IVPB        2 g 200 mL/hr over 30 Minutes Intravenous  Once 06/30/22 1722 06/30/22 2152   06/30/22 1730  metroNIDAZOLE (FLAGYL) IVPB 500 mg        500 mg 100 mL/hr over 60 Minutes Intravenous  Once 06/30/22 1722 07/01/22 0128   06/30/22 1730  vancomycin (VANCOCIN) IVPB 1000 mg/200 mL premix  Status:  Discontinued        1,000 mg 200 mL/hr over 60 Minutes Intravenous  Once 06/30/22 1722 06/30/22 1737        MEDICATIONS: Scheduled Meds:   HYDROmorphone (DILAUDID) injection  0.5 mg Intravenous Q6H   Continuous Infusions:   PRN Meds:.acetaminophen **OR** acetaminophen, antiseptic oral rinse, HYDROmorphone (DILAUDID) injection, LORazepam, ondansetron (ZOFRAN) IV, polyvinyl alcohol   I have personally reviewed following labs and imaging studies  LABORATORY DATA: CBC: Recent Labs  Lab 06/30/22 2050 06/30/22 2115 07/01/22 0702 07/02/22 0612   WBC 16.3*  --  12.3* 9.8  NEUTROABS 14.9*  --   --   --   HGB 15.8* 16.0* 12.7 9.9*  HCT 49.1* 47.0* 37.8 30.3*  MCV 93.3  --  91.5 92.7  PLT 223  --  173 127*    Basic Metabolic Panel: Recent Labs  Lab 06/30/22 1904 06/30/22 2050 06/30/22 2115 07/01/22 0702 07/02/22 0612  NA 140 142 142 139 143  K 4.7 4.7 4.4 4.7 3.1*  CL 105 100 103 98 108  CO2 12* 22  --  27 23  GLUCOSE 217* 197* 189* 144* 76  BUN 69* 64* 59* 65* 52*  CREATININE 2.62* 2.63* 2.70* 2.43* 2.00*  CALCIUM 9.1 9.5  --  8.3* 7.8*  MG  --   --   --  2.3  --   PHOS  --   --   --  3.3  --     GFR: Estimated Creatinine Clearance: 17.7 mL/min (A) (by C-G formula based on SCr of 2 mg/dL (H)).  Liver Function Tests: Recent Labs  Lab 06/30/22 1904 06/30/22 2050  AST 39 32  ALT QUANTITY NOT SUFFICIENT, UNABLE TO PERFORM TEST 21  ALKPHOS 70 81  BILITOT QUANTITY NOT SUFFICIENT, UNABLE TO PERFORM TEST 1.3*  PROT 6.0* 6.5  ALBUMIN 3.1* 3.3*   No results for input(s): "LIPASE", "AMYLASE" in the last 168 hours. No results for input(s): "AMMONIA" in the last 168 hours.  Coagulation Profile: No results for input(s): "INR", "PROTIME" in the last 168 hours.  Cardiac Enzymes: Recent Labs  Lab 07/01/22 0700  CKTOTAL 126    BNP (last 3 results) No results for input(s): "PROBNP" in the last 8760 hours.  Lipid Profile: No results for input(s): "CHOL", "HDL", "LDLCALC", "TRIG", "CHOLHDL", "LDLDIRECT" in the last 72 hours.  Thyroid Function Tests: No results for input(s): "TSH", "T4TOTAL", "FREET4", "T3FREE", "THYROIDAB" in the last 72 hours.   Anemia Panel: No results for input(s): "VITAMINB12", "FOLATE", "FERRITIN", "TIBC", "IRON", "RETICCTPCT" in the last 72 hours.  Urine analysis:    Component Value Date/Time   COLORURINE YELLOW 07/01/2022 2300   APPEARANCEUR CLEAR 07/01/2022 2300   LABSPEC 1.010 07/01/2022 2300   PHURINE 5.0 07/01/2022 2300   GLUCOSEU NEGATIVE 07/01/2022 2300   HGBUR NEGATIVE  07/01/2022 2300   BILIRUBINUR NEGATIVE 07/01/2022 2300   KETONESUR NEGATIVE 07/01/2022 2300   PROTEINUR 30 (A) 07/01/2022 2300   UROBILINOGEN 0.2 11/09/2013 1143   NITRITE NEGATIVE 07/01/2022 2300   LEUKOCYTESUR TRACE (A) 07/01/2022 2300    Sepsis Labs: Lactic Acid, Venous    Component Value Date/Time   LATICACIDVEN 1.9 07/01/2022 0702   RADIOLOGY STUDIES/RESULTS: No results found.   LOS: 5 days   Signature  -    Susa Raring M.D on 07/05/2022 at 10:20 AM   -  To page go to [email or web address]

## 2022-07-05 NOTE — Progress Notes (Signed)
Daily Progress Note   Patient Name: [name] [name]       Date: 07/05/2022 DOB: 1945/12/17  Age: 77 y.o. MRN#: [id number] Attending Physician: Leroy Sea, MD Primary Care Physician: [facility] Admit Date: 06/30/2022  Reason for Consultation/Follow-up: Establishing goals of care  Patient Profile/HPI:  [name] [name] is a 77 y.o. female with medical history significant for bipolar disorder, hypothyroidism, CKD 3A, breast cancer, chronic diastolic CHF, presenting to the emergency department after a fall at home. Workup revealed acute kidney injury on CKD. Palliative care has been asked to get involved to discuss hospice care vs. Palliative supportive care. Patient was transitioned to comfort measures.   Subjective: Chart reviewed including labs, progress notes, imaging from this and previous encounters.  Seen for symptom check.  Venia appears comfortable. She is awake but does not respond to any of my questions. Lunch tray is at bedside, untouched.   Review of Systems  Unable to perform ROS: Mental status change     Physical Exam Vitals and nursing note reviewed.  Constitutional:      Appearance: She is ill-appearing.  HENT:     Head:     Comments: Dressing to L forehead clean dry and intact Cardiovascular:     Rate and Rhythm: Normal rate.  Pulmonary:     Effort: Pulmonary effort is normal.  Neurological:     Mental Status: She is alert.             Vital Signs: BP (!) 109/54 (BP Location: Left Arm)   Pulse 70   Temp 97.6 F (36.4 C) (Axillary)   Resp 11   Ht 5\' 6"  (1.676 m)   Wt 47.6 kg   SpO2 96%   BMI 16.94 kg/m  SpO2: SpO2: 96 % O2 Device: O2 Device: Room Air O2 Flow Rate:    Intake/output summary:  Intake/Output Summary (Last 24  hours) at 07/05/2022 1659 Last data filed at 07/05/2022 1036 Gross per 24 hour  Intake 120 ml  Output --  Net 120 ml   LBM: Last BM Date : 07/02/22 Baseline Weight: Weight: 56.7 kg Most recent weight: Weight: 47.6 kg       Palliative Assessment/Data: PPS: 20%      Patient Active Problem List   Diagnosis Date Noted   Acute renal failure superimposed  on stage 3a chronic kidney disease (HCC) 06/30/2022   SIRS (systemic inflammatory response syndrome) (HCC) 06/30/2022   Hypotension 06/30/2022   Hypothermia 06/30/2022   Prolonged QT interval 06/30/2022   Generalized weakness    Acute urinary retention 03/19/2021   Abscess    Vertebral osteomyelitis (HCC)    Thrush, oral 03/12/2021   Morbid obesity (HCC) 03/11/2021   MSSA bacteremia 03/09/2021   Sepsis (HCC) 03/08/2021   Acute renal failure superimposed on stage 3b chronic kidney disease (HCC) 03/08/2021   Elevated LFTs 03/08/2021   DMII (diabetes mellitus, type 2) (HCC) 03/08/2021   Normocytic anemia 03/08/2021   Fall at home, initial encounter 03/08/2021   Hypothyroidism 03/08/2021   Chronic diastolic heart failure (HCC) 01/01/2021   Nonrheumatic mitral valve regurgitation 01/01/2021   Stage 3a chronic kidney disease (HCC) 01/01/2021   Genetic testing 08/10/2019   Family history of breast cancer    Family history of colon cancer    Malignant neoplasm of upper-inner quadrant of right breast in female, estrogen receptor positive (HCC) 07/30/2019   Osteopenia 09/10/2016   Vaginal atrophy 09/10/2015   Menopause 09/10/2015    Palliative Care Assessment & Plan    Assessment/Recommendations/Plan  Continue comfort measures only Appreciate TOC working on disposition   Code Status: DNR  Prognosis:  < 3 months  Discharge Planning: To Be Determined   Thank you for allowing the Palliative Medicine Team to assist in the care of this patient.  Greater than 50%  of this time was spent counseling and coordinating care  related to the above assessment and plan.  Ocie Bob, AGNP-C Palliative Medicine   Please contact Palliative Medicine Team phone at [phone] for questions and concerns.

## 2022-07-06 DIAGNOSIS — N1831 Chronic kidney disease, stage 3a: Secondary | ICD-10-CM | POA: Diagnosis not present

## 2022-07-06 DIAGNOSIS — R627 Adult failure to thrive: Secondary | ICD-10-CM

## 2022-07-06 DIAGNOSIS — N179 Acute kidney failure, unspecified: Secondary | ICD-10-CM | POA: Diagnosis not present

## 2022-07-06 MED ORDER — FENTANYL 12 MCG/HR TD PT72: 1.0000 | MEDICATED_PATCH | TRANSDERMAL | Status: DC

## 2022-07-06 MED ORDER — OXYCODONE HCL 5 MG/5ML PO SOLN: 5.0000 mg | ORAL | Status: DC | PRN

## 2022-07-06 NOTE — TOC Progression Note (Signed)
Transition of Care [facility]) - Progression Note    Patient Details  Name: [name] [name] MRN: [id number] Date of Birth: [birth year]  Transition of Care [facility]) CM/SW Contact  Gordy Clement, RN Phone Number: 07/06/2022, 11:36 AM  Clinical Narrative:     CM asked by CSW to contact family to discuss a DC with Hospice at home now that patient is comfort care.  RNCM called Daughter, [name] Braun [phone] ) who resides in [place]. Patient is now comfort care but does not qualify for [facility] at [facility]. She is declining , but slowly.  Daughter states that it would be out of the question for patient to return to her home setting. Patient lives with her Husband who has Alzheimer's, and patient's Son and Hildred Priest are also in the home. 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   Daughter states that SNF is also out of the question- They cannot afford to pay out of pocket and they had a very negative experience with patient previously in a SNF.   Daughter stated that she was under the impression after conversation with patients Providers that her Mom would see a decline over the next couple to few weeks until she passed and stated she asked if her Mom could stay here under "comfort care" until a [facility] hospice bed was available and was told yes. She stated that her Mother is "finally comfortable" here and wants her to be able to stay until [facility] says she can come.   Daughter also requested an update from her Mother's Nurse today. RNCM sent a secure chat requesting Daughter be called.   TOC will continue to follow to assist in any DC planning that is possible       Barriers to Discharge: Continued Medical Work up  Expected Discharge Plan and Services In-house Referral: Clinical Social Work, Hospice / Palliative Care   Post Acute Care Choice: Hospice Living arrangements for the past 2 months: Single Family Home                                        Social Determinants of Health (SDOH) Interventions SDOH Screenings   Depression (PHQ2-9): Low Risk  (05/19/2021)  Tobacco Use: Low Risk  (06/30/2022)    Readmission Risk Interventions    04/02/2021    2:53 PM  Readmission Risk Prevention Plan  HRI or Home Care Consult Complete  Social Work Consult for Recovery Care Planning/Counseling Complete  Palliative Care Screening Not Applicable

## 2022-07-06 NOTE — Progress Notes (Addendum)
PROGRESS NOTE        PATIENT DETAILS Name: [name] [name] Age: 77 y.o. Sex: female Date of Birth: [birth year] Admit Date: 06/30/2022 Admitting Physician Briscoe Deutscher, MD [id number], [facility]  Brief Summary: Patient is a 77 y.o.  female with chronic HFpEF, dementia, CKD stage IIIa, hypothyroidism, bipolar disorder who presented with a mechanical fall-found to have AKI and subsequently admitted to the hospitalist service.  Significant events: 5/15>> admit to [facility]   Significant studies: 5/15>> CXR: No PNA 5/15>> x-ray pelvis: Slight deformity of left superior pubic rami-subtle fracture is possible. 5/15>> CT head: No acute intracranial process 5/15>> CT C-spine: No fracture or traumatic listhesis 5/16>> CT pelvis: No acute fracture 5/16>> renal ultrasound: No hydronephrosis  Significant microbiology data: 5/15>> blood culture: No growth 5/15>> RSV/influenza/COVID PCR: Negative  Procedures: None  Consults: None  Subjective:  Awake but confused, denies any headache or chest pain.  Objective:  Vitals: Blood pressure (!) 114/53, pulse 70, temperature 97.7 F (36.5 C), temperature source Oral, resp. rate 10, height 5\' 6"  (1.676 m), weight 47.6 kg, SpO2 92 %.   Exam:  Awake and confused, moves all 4 extremities, bandage on the left side of her face .AT,PERRAL Supple Neck, No JVD,   Symmetrical Chest wall movement, Good air movement bilaterally, CTAB RRR,No Gallops, Rubs or new Murmurs,  +ve B.Sounds, Abd Soft, No tenderness,   No Cyanosis, Clubbing or edema    Assessment/Plan:  Note after detailed discussions between family and palliative care on 07/02/2022 patient has now been transition to full comfort care, she is DNR.  We await residential hospice bed.  Only comfort medications on board.  Other active medical issues addressed this admission previously prior to transitioning to comfort measures as below.   AKI on CKD  stage IIIa Suspect hemodynamically mediated-slowly improving UA negative for significant proteinuria, CK normal, renal ultrasound negative for hydronephrosis Renal function improving with supportive care Continue to check frequent scans. Avoid nephrotoxic agents Follow electrolytes.  Anion gap metabolic acidosis Suspect due to AKI Resolved with bicarb infusion  Hypokalemia Replete/recheck  Fall Skin tear/left facial trauma Imaging as above Appreciate trauma input Received Tdap in the ED Local wound care PT/OT  SIRS No infectious cause apparent Cultures negative so far UA negative for UTI Extensive imaging negative for any obvious infection Given hemodynamic stability-being monitored off antibiotics.  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   Continue gentle IV fluid hydration  Chronic HFpEF Euvolemic on exam Furosemide on hold due to AKI  HLD Hold statin for now  DM-2 (A1c 5.4 on 5/16) CBG stable with SSI  No results for input(s): "GLUCAP" in the last 72 hours.   Hypothyroidism Synthroid  History of breast cancer Resume anastrozole  Dementia with delirium Delirium precautions  Addendum Previous Spoke with daughter-Jennifer [phone]- DNR  BMI: Estimated body mass index is 16.94 kg/m as calculated from the following:   Height as of this encounter: 5\' 6"  (1.676 m).   Weight as of this encounter: 47.6 kg.   Code status:   Code Status: DNR   DVT Prophylaxis:  Family Communication: daughter [phone] updated on 07/04/22, 07/06/22 - 2.22  pm - message left   Disposition Plan: Status is: Inpatient Remains inpatient appropriate because: Severity of illness   Planned  Discharge Destination:Home health   Diet: Diet Order             Diet regular Room service appropriate? Yes; Fluid consistency: Thin  Diet effective now                     Antimicrobial agents: Anti-infectives (From admission, onward)    Start     Dose/Rate Route Frequency Ordered Stop   07/01/22 1800  ceFEPIme (MAXIPIME) 2 g in sodium chloride 0.9 % 100 mL IVPB  Status:  Discontinued        2 g 200 mL/hr over 30 Minutes Intravenous Every 24 hours 06/30/22 2017 07/01/22 0950   07/01/22 0530  metroNIDAZOLE (FLAGYL) IVPB 500 mg  Status:  Discontinued        500 mg 100 mL/hr over 60 Minutes Intravenous Every 12 hours 06/30/22 2217 07/01/22 0950   06/30/22 2020  vancomycin variable dose per unstable renal function (pharmacist dosing)  Status:  Discontinued         Does not apply See admin instructions 06/30/22 2020 07/01/22 0950   06/30/22 1745  vancomycin (VANCOREADY) IVPB 1250 mg/250 mL        1,250 mg 166.7 mL/hr over 90 Minutes Intravenous  Once 06/30/22 1737 06/30/22 2252   06/30/22 1730  ceFEPIme (MAXIPIME) 2 g in sodium chloride 0.9 % 100 mL IVPB        2 g 200 mL/hr over 30 Minutes Intravenous  Once 06/30/22 1722 06/30/22 2152   06/30/22 1730  metroNIDAZOLE (FLAGYL) IVPB 500 mg        500 mg 100 mL/hr over 60 Minutes Intravenous  Once 06/30/22 1722 07/01/22 0128   06/30/22 1730  vancomycin (VANCOCIN) IVPB 1000 mg/200 mL premix  Status:  Discontinued        1,000 mg 200 mL/hr over 60 Minutes Intravenous  Once 06/30/22 1722 06/30/22 1737        MEDICATIONS: Scheduled Meds:   HYDROmorphone (DILAUDID) injection  0.5 mg Intravenous Q6H   Continuous Infusions:   PRN Meds:.acetaminophen **OR** acetaminophen, antiseptic oral rinse, HYDROmorphone (DILAUDID) injection, LORazepam, ondansetron (ZOFRAN) IV, polyvinyl alcohol   I have personally reviewed following labs and imaging studies  LABORATORY DATA: CBC: Recent Labs  Lab 06/30/22 2050 06/30/22 2115  07/01/22 0702 07/02/22 0612  WBC 16.3*  --  12.3* 9.8  NEUTROABS 14.9*  --   --   --   HGB 15.8* 16.0* 12.7 9.9*  HCT 49.1* 47.0* 37.8 30.3*  MCV 93.3  --  91.5 92.7  PLT 223  --  173 127*    Basic Metabolic Panel: Recent Labs  Lab 06/30/22 1904 06/30/22 2050 06/30/22 2115 07/01/22 0702 07/02/22 0612  NA 140 142 142 139 143  K 4.7 4.7 4.4 4.7 3.1*  CL 105 100 103 98 108  CO2 12* 22  --  27 23  GLUCOSE 217* 197* 189* 144* 76  BUN 69* 64* 59* 65* 52*  CREATININE 2.62* 2.63* 2.70* 2.43* 2.00*  CALCIUM 9.1 9.5  --  8.3* 7.8*  MG  --   --   --  2.3  --   PHOS  --   --   --  3.3  --     GFR: Estimated Creatinine Clearance: 17.7 mL/min (A) (by C-G formula based on SCr of 2 mg/dL (H)).  Liver Function Tests: Recent  Labs  Lab 06/30/22 1904 06/30/22 2050  AST 39 32  ALT QUANTITY NOT SUFFICIENT, UNABLE TO PERFORM TEST 21  ALKPHOS 70 81  BILITOT QUANTITY NOT SUFFICIENT, UNABLE TO PERFORM TEST 1.3*  PROT 6.0* 6.5  ALBUMIN 3.1* 3.3*   No results for input(s): "LIPASE", "AMYLASE" in the last 168 hours. No results for input(s): "AMMONIA" in the last 168 hours.  Coagulation Profile: No results for input(s): "INR", "PROTIME" in the last 168 hours.  Cardiac Enzymes: Recent Labs  Lab 07/01/22 0700  CKTOTAL 126    BNP (last 3 results) No results for input(s): "PROBNP" in the last 8760 hours.  Lipid Profile: No results for input(s): "CHOL", "HDL", "LDLCALC", "TRIG", "CHOLHDL", "LDLDIRECT" in the last 72 hours.  Thyroid Function Tests: No results for input(s): "TSH", "T4TOTAL", "FREET4", "T3FREE", "THYROIDAB" in the last 72 hours.   Anemia Panel: No results for input(s): "VITAMINB12", "FOLATE", "FERRITIN", "TIBC", "IRON", "RETICCTPCT" in the last 72 hours.  Urine analysis:    Component Value Date/Time   COLORURINE YELLOW 07/01/2022 2300   APPEARANCEUR CLEAR 07/01/2022 2300   LABSPEC 1.010 07/01/2022 2300   PHURINE 5.0 07/01/2022 2300   GLUCOSEU NEGATIVE  07/01/2022 2300   HGBUR NEGATIVE 07/01/2022 2300   BILIRUBINUR NEGATIVE 07/01/2022 2300   KETONESUR NEGATIVE 07/01/2022 2300   PROTEINUR 30 (A) 07/01/2022 2300   UROBILINOGEN 0.2 11/09/2013 1143   NITRITE NEGATIVE 07/01/2022 2300   LEUKOCYTESUR TRACE (A) 07/01/2022 2300    Sepsis Labs: Lactic Acid, Venous    Component Value Date/Time   LATICACIDVEN 1.9 07/01/2022 0702   RADIOLOGY STUDIES/RESULTS: No results found.   LOS: 6 days   Signature  -    Susa Raring M.D on 07/06/2022 at 8:10 AM   -  To page go to [email or web address]

## 2022-07-06 NOTE — Progress Notes (Signed)
Daily Progress Note   Patient Name: [name] [name]       Date: 07/06/2022 DOB: 03-02-1945  Age: 77 y.o. MRN#: [id number] Attending Physician: Leroy Sea, MD Primary Care Physician: [facility] Admit Date: 06/30/2022  Reason for Consultation/Follow-up: Establishing goals of care  Patient Profile/HPI:  [name] [name] is a 77 y.o. female with medical history significant for bipolar disorder, hypothyroidism, CKD 3A, breast cancer, chronic diastolic CHF, presenting to the emergency department after a fall at home. Workup revealed acute kidney injury on CKD. Palliative care has been asked to get involved to discuss hospice care vs. Palliative supportive care. Patient was transitioned to comfort measures.   Subjective: Chart reviewed including labs, progress notes, imaging from this and previous encounters. She is eating minimally- 25 percent meal intake documented. Seen for symptom check. [name] [name] is awake and alert. Lying in fetal position in bed.  Lunch tray is at bedside, untouched.She shakes her head no when asked if she would like something to eat. Shakes her head no when asked if she's in pain.  [name] [name] appears comfortable.    Review of Systems  Unable to perform ROS: Mental status change     Physical Exam Vitals and nursing note reviewed.  Constitutional:      Appearance: She is ill-appearing.  HENT:     Head:     Comments: Dressing to L forehead clean dry and intact Cardiovascular:     Rate and Rhythm: Normal rate.  Pulmonary:     Effort: Pulmonary effort is normal.  Neurological:     Mental Status: She is alert.             Vital Signs: BP (!) 130/108 (BP Location: Left Arm)   Pulse 63   Temp 98 F (36.7 C) (Oral)   Resp 10   Ht 5\' 6"  (1.676 m)    Wt 47.6 kg   SpO2 97%   BMI 16.94 kg/m  SpO2: SpO2: 97 % O2 Device: O2 Device: Room Air O2 Flow Rate:    Intake/output summary:  Intake/Output Summary (Last 24 hours) at 07/06/2022 1608 Last data filed at 07/06/2022 1145 Gross per 24 hour  Intake --  Output 750 ml  Net -750 ml    LBM: Last BM Date : 07/02/22 Baseline Weight: Weight: 56.7 kg Most recent weight:  Weight: 47.6 kg       Palliative Assessment/Data: PPS: 20%      Patient Active Problem List   Diagnosis Date Noted   Acute renal failure superimposed on stage 3a chronic kidney disease (HCC) 06/30/2022   SIRS (systemic inflammatory response syndrome) (HCC) 06/30/2022   Hypotension 06/30/2022   Hypothermia 06/30/2022   Prolonged QT interval 06/30/2022   Generalized weakness    Acute urinary retention 03/19/2021   Abscess    Vertebral osteomyelitis (HCC)    Thrush, oral 03/12/2021   Morbid obesity (HCC) 03/11/2021   MSSA bacteremia 03/09/2021   Sepsis (HCC) 03/08/2021   Acute renal failure superimposed on stage 3b chronic kidney disease (HCC) 03/08/2021   Elevated LFTs 03/08/2021   DMII (diabetes mellitus, type 2) (HCC) 03/08/2021   Normocytic anemia 03/08/2021   Fall at home, initial encounter 03/08/2021   Hypothyroidism 03/08/2021   Chronic diastolic heart failure (HCC) 01/01/2021   Nonrheumatic mitral valve regurgitation 01/01/2021   Stage 3a chronic kidney disease (HCC) 01/01/2021   Genetic testing 08/10/2019   Family history of breast cancer    Family history of colon cancer    Malignant neoplasm of upper-inner quadrant of right breast in female, estrogen receptor positive (HCC) 07/30/2019   Osteopenia 09/10/2016   Vaginal atrophy 09/10/2015   Menopause 09/10/2015    Palliative Care Assessment & Plan    Assessment/Recommendations/Plan  Continue comfort measures only- d/c scheduled hydromophone, continue prn hydromorphone IV for severe pain or SOB, and start prn oxycodone solution 5mg  q2hr prn  for pain in anticipation for discharge to another setting with hospice  Appreciate TOC efforts at disposition   Code Status: DNR  Prognosis:  < 3 months  Discharge Planning: To Be Determined   Thank you for allowing the Palliative Medicine Team to assist in the care of this patient.  Greater than 50%  of this time was spent counseling and coordinating care related to the above assessment and plan.  Ocie Bob, AGNP-C Palliative Medicine   Please contact Palliative Medicine Team phone at [phone] for questions and concerns.

## 2022-07-07 DIAGNOSIS — N179 Acute kidney failure, unspecified: Secondary | ICD-10-CM | POA: Diagnosis not present

## 2022-07-07 DIAGNOSIS — N1831 Chronic kidney disease, stage 3a: Secondary | ICD-10-CM | POA: Diagnosis not present

## 2022-07-07 NOTE — Progress Notes (Addendum)
PROGRESS NOTE        PATIENT DETAILS Name: [name] [name] Age: 77 y.o. Sex: female Date of Birth: [birth year] Admit Date: 06/30/2022 Admitting Physician Briscoe Deutscher, MD [id number], [facility] Medical  Brief Summary: Patient is a 77 y.o.  female with chronic HFpEF, dementia, CKD stage IIIa, hypothyroidism, bipolar disorder who presented with a mechanical fall-found to have AKI and subsequently admitted to the hospitalist service.  Significant events: 5/15>> admit to [facility]   Significant studies: 5/15>> CXR: No PNA 5/15>> x-ray pelvis: Slight deformity of left superior pubic rami-subtle fracture is possible. 5/15>> CT head: No acute intracranial process 5/15>> CT C-spine: No fracture or traumatic listhesis 5/16>> CT pelvis: No acute fracture 5/16>> renal ultrasound: No hydronephrosis  Significant microbiology data: 5/15>> blood culture: No growth 5/15>> RSV/influenza/COVID PCR: Negative  Procedures: None  Consults: None   Subjective:  Patient in bed, appears comfortable, denies any headache, no fever, no chest pain or pressure, no shortness of breath , no abdominal pain. No new focal weakness.  Objective:  Vitals: Blood pressure (!) 135/59, pulse 66, temperature 97.8 F (36.6 C), temperature source Oral, resp. rate 10, height 5\' 6"  (1.676 m), weight 47.6 kg, SpO2 94 %.   Exam:  Awake and confused, moves all 4 extremities, bandage on the left side of her face [place].AT,PERRAL Supple Neck, No JVD,   Symmetrical Chest wall movement, Good air movement bilaterally, CTAB RRR,No Gallops, Rubs or new Murmurs,  +ve B.Sounds, Abd Soft, No tenderness,   No Cyanosis, Clubbing or edema    Assessment/Plan:  Note after detailed discussions between family and palliative care on 07/02/2022 patient has now been transition to full comfort care, she is DNR.  We were hoping for a residential residential hospice bed family preference, unfortunately per  residential hospice patient is not eligible for their inpatient criteria.  Only comfort medications on board.  TOC trying for appropriate placement.  Other active medical issues addressed this admission previously prior to transitioning to comfort measures as below.   AKI on CKD stage IIIa Suspect hemodynamically mediated-slowly improving UA negative for significant proteinuria, CK normal, renal ultrasound negative for hydronephrosis Renal function improving with supportive care Continue to check frequent scans. Avoid nephrotoxic agents Follow electrolytes.  Anion gap metabolic acidosis Suspect due to AKI Resolved with bicarb infusion  Hypokalemia Replete/recheck  Fall Skin tear/left facial trauma Imaging as above Appreciate trauma input Received Tdap in the ED Local wound care PT/OT  SIRS No infectious cause apparent Cultures negative so far UA negative for UTI Extensive imaging negative for any obvious infection Given hemodynamic stability-being monitored off antibiotics.  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   Continue gentle IV fluid hydration  Chronic HFpEF Euvolemic on exam Furosemide on hold due to AKI  HLD Hold statin for now  DM-2 (A1c 5.4 on 5/16) CBG stable with SSI  No results for input(s): "GLUCAP" in the last 72 hours.   Hypothyroidism Synthroid  History of breast cancer Resume anastrozole  Dementia with delirium Delirium precautions  BMI: Estimated body mass index is 16.94 kg/m as calculated from the following:   Height as of this encounter: 5\' 6"  (  1.676 m).   Weight as of this encounter: 47.6 kg.   Code status:   Code Status: DNR   DVT Prophylaxis:  Family  Communication: daughter [phone] updated on 07/04/22, 07/06/22 - 2.22 pm - message left, updated 07/07/22.  Daughter has repeatedly expressed her desire that patient be kept here for several weeks till she passes away, I have informed her multiple times that disposition is up to the [facility] team who are in touch with her, also made it very clear that patient although on comfort care may be several weeks away from her final demise.   Disposition Plan: Status is: Inpatient Remains inpatient appropriate because: Severity of illness   Planned Discharge Destination:Home health   Diet: Diet Order             Diet regular Room service appropriate? Yes; Fluid consistency: Thin  Diet effective now                   MEDICATIONS: Scheduled Meds:   Continuous Infusions:   PRN Meds:.acetaminophen **OR** acetaminophen, antiseptic oral rinse, HYDROmorphone (DILAUDID) injection, LORazepam, ondansetron (ZOFRAN) IV, oxyCODONE, polyvinyl alcohol   I have personally reviewed following labs and imaging studies  LABORATORY DATA:  Recent Labs  Lab 06/30/22 2050 06/30/22 2115 07/01/22 0702 07/02/22 0612  WBC 16.3*  --  12.3* 9.8  HGB 15.8* 16.0* 12.7 9.9*  HCT 49.1* 47.0* 37.8 30.3*  PLT 223  --  173 127*  MCV 93.3  --  91.5 92.7  MCH 30.0  --  30.8 30.3  MCHC 32.2  --  33.6 32.7  RDW 14.3  --  14.6 14.6  LYMPHSABS 0.4*  --   --   --   MONOABS 0.8  --   --   --   EOSABS 0.0  --   --   --   BASOSABS 0.0  --   --   --     Recent Labs  Lab 06/30/22 1904 06/30/22 2050 06/30/22 2115 06/30/22 2236 07/01/22 0702 07/02/22 0612  NA 140 142 142  --  139 143  K 4.7 4.7 4.4  --  4.7 3.1*  CL 105 100 103  --  98 108  CO2 12* 22  --   --  27 23  ANIONGAP 23* 20*  --   --  14 12  GLUCOSE 217* 197* 189*  --  144* 76  BUN 69* 64* 59*  --  65* 52*  CREATININE 2.62* 2.63* 2.70*  --  2.43* 2.00*  AST 39 32  --   --   --   --   ALT QUANTITY NOT SUFFICIENT, UNABLE TO PERFORM TEST 21  --    --   --   --   ALKPHOS 70 81  --   --   --   --   BILITOT QUANTITY NOT SUFFICIENT, UNABLE TO PERFORM TEST 1.3*  --   --   --   --   ALBUMIN 3.1* 3.3*  --   --   --   --   PROCALCITON  --  1.60  --   --   --   --   LATICACIDVEN  --  5.9*  --  4.5* 1.9  --   TSH  --  4.633*  --   --   --   --   HGBA1C  --   --   --   --  5.4  --   MG  --   --   --   --  2.3  --   CALCIUM 9.1 9.5  --   --  8.3* 7.8*    No results found for: "CHOL", "HDL", "LDLCALC", "LDLDIRECT", "TRIG", "CHOLHDL"    Recent Labs  Lab 06/30/22 1904 06/30/22 2050 06/30/22 2236 07/01/22 0702 07/02/22 0612  PROCALCITON  --  1.60  --   --   --   LATICACIDVEN  --  5.9* 4.5* 1.9  --   TSH  --  4.633*  --   --   --   HGBA1C  --   --   --  5.4  --   MG  --   --   --  2.3  --   CALCIUM 9.1 9.5  --  8.3* 7.8*     RADIOLOGY STUDIES/RESULTS: No results found.   LOS: 7 days   Signature  -    Susa Raring M.D on 07/07/2022 at 7:36 AM   -  To page go to [email or web address]

## 2022-07-07 NOTE — Progress Notes (Signed)
Daily Progress Note   Patient Name: [name] [name]       Date: 07/07/2022 DOB: Jan 24, 1946  Age: 77 y.o. MRN#: [id number] Attending Physician: Leroy Sea, MD Primary Care Physician: [facility] Admit Date: 06/30/2022  Reason for Consultation/Follow-up: Establishing goals of care  Patient Profile/HPI:  Audrianne Montie is a 77 y.o. female with medical history significant for bipolar disorder, hypothyroidism, CKD 3A, breast cancer, chronic diastolic CHF, presenting to the emergency department after a fall at home. Workup revealed acute kidney injury on CKD. Palliative care has been asked to get involved to discuss hospice care vs. Palliative supportive care. Patient was transitioned to comfort measures.   Subjective: Chart reviewed including labs, progress notes, imaging from this and previous encounters. She is eating minimally- 25 percent meal intake documented. Seen for symptom check. She was sleeping. Received 1mg  hydromorphone IV in the last 24 hours.   Review of Systems  Unable to perform ROS: Mental status change     Physical Exam Vitals and nursing note reviewed.  Constitutional:      Appearance: She is ill-appearing.  HENT:     Head:     Comments: Dressing to L forehead clean dry and intact Cardiovascular:     Rate and Rhythm: Normal rate.  Pulmonary:     Effort: Pulmonary effort is normal.  Neurological:     Mental Status: She is alert.             Vital Signs: BP 113/66 (BP Location: Left Arm)   Pulse (!) 55   Temp 97.8 F (36.6 C) (Oral)   Resp (!) 7   Ht 5\' 6"  (1.676 m)   Wt 47.6 kg   SpO2 95%   BMI 16.94 kg/m  SpO2: SpO2: 95 % O2 Device: O2 Device: Room Air O2 Flow Rate:    Intake/output summary:  Intake/Output Summary (Last 24  hours) at 07/07/2022 1313 Last data filed at 07/07/2022 0500 Gross per 24 hour  Intake --  Output 700 ml  Net -700 ml    LBM: Last BM Date : 07/02/22 Baseline Weight: Weight: 56.7 kg Most recent weight: Weight: 47.6 kg       Palliative Assessment/Data: PPS: 20%      Patient Active Problem List   Diagnosis Date Noted   Acute renal failure superimposed  on stage 3a chronic kidney disease (HCC) 06/30/2022   SIRS (systemic inflammatory response syndrome) (HCC) 06/30/2022   Hypotension 06/30/2022   Hypothermia 06/30/2022   Prolonged QT interval 06/30/2022   Generalized weakness    Acute urinary retention 03/19/2021   Abscess    Vertebral osteomyelitis (HCC)    Thrush, oral 03/12/2021   Morbid obesity (HCC) 03/11/2021   MSSA bacteremia 03/09/2021   Sepsis (HCC) 03/08/2021   Acute renal failure superimposed on stage 3b chronic kidney disease (HCC) 03/08/2021   Elevated LFTs 03/08/2021   DMII (diabetes mellitus, type 2) (HCC) 03/08/2021   Normocytic anemia 03/08/2021   Fall at home, initial encounter 03/08/2021   Hypothyroidism 03/08/2021   Chronic diastolic heart failure (HCC) 01/01/2021   Nonrheumatic mitral valve regurgitation 01/01/2021   Stage 3a chronic kidney disease (HCC) 01/01/2021   Genetic testing 08/10/2019   Family history of breast cancer    Family history of colon cancer    Malignant neoplasm of upper-inner quadrant of right breast in female, estrogen receptor positive (HCC) 07/30/2019   Osteopenia 09/10/2016   Vaginal atrophy 09/10/2015   Menopause 09/10/2015    Palliative Care Assessment & Plan    Assessment/Recommendations/Plan  Continue current comfort measures- recommend using liquid oxycodone before IV hydromorphone if pain is not severe Appreciate TOC working on disposition   Code Status: DNR  Prognosis:  < 3 months  Discharge Planning: To Be Determined   Thank you for allowing the Palliative Medicine Team to assist in the care of this  patient.  Greater than 50%  of this time was spent counseling and coordinating care related to the above assessment and plan.  Ocie Bob, AGNP-C Palliative Medicine   Please contact Palliative Medicine Team phone at [phone] for questions and concerns.

## 2022-07-07 NOTE — TOC Progression Note (Addendum)
Transition of Care [facility]) - Progression Note    Patient Details  Name: [name] [name] MRN: [id number] Date of Birth: [birth year]  Transition of Care [facility]) CM/SW Contact  Mearl Latin, LCSW Phone Number: 07/07/2022, 4:31 PM  Clinical Narrative:    4:31p- CSW discussed discharge barriers with [facility] and reached out to [facility] to ask about their facility program criteria since it differs from [facility] requirements. After speaking with liaison, [name] [name], patient seems to match their criteria, though they do not have beds available today. CSW left voicemail for patient's daughter to discuss referral options.   5:15pm-[name] [name] returned call and CSW presented [facility] option. CSW emailed her the information about the facility ([email or web address]). She believes family would prefer [place] location (25 mins from their home) but will discuss with them tonight. She provided permission for CSW to have [facility] liaison reach out to her to answer questions.      Barriers to Discharge: Continued Medical Work up  Expected Discharge Plan and Services In-house Referral: Clinical Social Work, Hospice / Palliative Care   Post Acute Care Choice: Hospice Living arrangements for the past 2 months: Single Family Home                                       Social Determinants of Health (SDOH) Interventions SDOH Screenings   Depression (PHQ2-9): Low Risk  (05/19/2021)  Tobacco Use: Low Risk  (06/30/2022)    Readmission Risk Interventions    04/02/2021    2:53 PM  Readmission Risk Prevention Plan  HRI or Home Care Consult Complete  Social Work Consult for Recovery Care Planning/Counseling Complete  Palliative Care Screening Not Applicable

## 2022-07-08 DIAGNOSIS — N1831 Chronic kidney disease, stage 3a: Secondary | ICD-10-CM | POA: Diagnosis not present

## 2022-07-08 DIAGNOSIS — N179 Acute kidney failure, unspecified: Secondary | ICD-10-CM | POA: Diagnosis not present

## 2022-07-08 MED ORDER — HYDROCODONE-ACETAMINOPHEN 5-325 MG PO TABS: 1.0000 | ORAL_TABLET | Freq: Three times a day (TID) | ORAL | Status: DC

## 2022-07-08 MED ORDER — OXYCODONE HCL 5 MG PO TABS
5.0000 mg | ORAL_TABLET | Freq: Three times a day (TID) | ORAL | Status: DC
  Filled 2022-07-08: qty 1

## 2022-07-08 NOTE — Progress Notes (Signed)
PROGRESS NOTE        PATIENT DETAILS Name: [name] [name] Age: 77 y.o. Sex: female Date of Birth: [birth year] Admit Date: 06/30/2022 Admitting Physician Briscoe Deutscher, MD BJY:[id number], [facility]  Brief Summary: Patient is a 77 y.o.  female with chronic HFpEF, dementia, CKD stage IIIa, hypothyroidism, bipolar disorder who presented with a mechanical fall-found to have AKI and subsequently admitted to the hospitalist service.  Significant events: 5/15>> admit to [facility]   Significant studies: 5/15>> CXR: No PNA 5/15>> x-ray pelvis: Slight deformity of left superior pubic rami-subtle fracture is possible. 5/15>> CT head: No acute intracranial process 5/15>> CT C-spine: No fracture or traumatic listhesis 5/16>> CT pelvis: No acute fracture 5/16>> renal ultrasound: No hydronephrosis  Significant microbiology data: 5/15>> blood culture: No growth 5/15>> RSV/influenza/COVID PCR: Negative  Procedures: None  Consults: None   Subjective:  Patient in bed, appears comfortable, denies any headache, no fever, no chest pain or pressure, no shortness of breath , no abdominal pain. No new focal weakness.  Objective:  Vitals: Blood pressure 113/66, pulse 66, temperature 97.8 F (36.6 C), temperature source Oral, resp. rate 11, height 5\' 6"  (1.676 m), weight 47.6 kg, SpO2 92 %.   Exam:  Awake and confused, moves all 4 extremities, bandage on the left side of her face [place].AT,PERRAL Supple Neck, No JVD,   Symmetrical Chest wall movement, Good air movement bilaterally, CTAB RRR,No Gallops, Rubs or new Murmurs,  +ve B.Sounds, Abd Soft, No tenderness,   No Cyanosis, Clubbing or edema    Assessment/Plan:  Note after detailed discussions between family and palliative care on 07/02/2022 patient has now been transition to full comfort care, she is DNR.  We were hoping for a residential residential hospice bed family preference, unfortunately per residential  hospice which was initially contacted patient is not eligible for their inpatient criteria, other facilities are being looked at.  Only comfort medications on board.  TOC trying for appropriate placement.  Other active medical issues addressed this admission previously prior to transitioning to comfort measures as below.   AKI on CKD stage IIIa Suspect hemodynamically mediated-slowly improving UA negative for significant proteinuria, CK normal, renal ultrasound negative for hydronephrosis Renal function improving with supportive care Continue to check frequent scans. Avoid nephrotoxic agents Follow electrolytes.  Anion gap metabolic acidosis Suspect due to AKI Resolved with bicarb infusion  Hypokalemia Replete/recheck  Fall Skin tear/left facial trauma Imaging as above Appreciate trauma input Received Tdap in the ED Local wound care PT/OT  SIRS No infectious cause apparent Cultures negative so far UA negative for UTI Extensive imaging negative for any obvious infection Given hemodynamic stability-being monitored off antibiotics.  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   Continue gentle IV fluid hydration  Chronic HFpEF Euvolemic on exam Furosemide on hold due to AKI  HLD Hold statin for now  DM-2 (A1c 5.4 on 5/16) CBG stable with SSI  No results for input(s): "GLUCAP" in the last 72 hours.   Hypothyroidism Synthroid  History of breast cancer Resume anastrozole  Dementia with delirium Delirium precautions  BMI: Estimated body mass index is 16.94 kg/m as calculated from the following:  Height as of this encounter: 5\' 6"  (1.676 m).   Weight as of this encounter: 47.6 kg.   Code  status:   Code Status: DNR   DVT Prophylaxis:  Family Communication: daughter [phone] updated on 07/04/22, 07/06/22 - 2.22 pm - message left, updated 07/07/22.  Daughter has repeatedly expressed her desire that patient be kept here for several weeks till she passes away, I have informed her multiple times that disposition is up to the [facility] team who are in touch with her, also made it very clear that patient although on comfort care may be several weeks away from her final demise.   Disposition Plan: Status is: Inpatient Remains inpatient appropriate because: Severity of illness   Planned Discharge Destination:Home health   Diet: Diet Order             Diet regular Room service appropriate? Yes; Fluid consistency: Thin  Diet effective now                   MEDICATIONS: Scheduled Meds:  oxyCODONE  5 mg Oral TID    Continuous Infusions:   PRN Meds:.acetaminophen **OR** acetaminophen, antiseptic oral rinse, HYDROmorphone (DILAUDID) injection, LORazepam, ondansetron (ZOFRAN) IV, polyvinyl alcohol   I have personally reviewed following labs and imaging studies  LABORATORY DATA:  Recent Labs  Lab 07/02/22 0612  WBC 9.8  HGB 9.9*  HCT 30.3*  PLT 127*  MCV 92.7  MCH 30.3  MCHC 32.7  RDW 14.6    Recent Labs  Lab 07/02/22 0612  NA 143  K 3.1*  CL 108  CO2 23  ANIONGAP 12  GLUCOSE 76  BUN 52*  CREATININE 2.00*  CALCIUM 7.8*    No results found for: "CHOL", "HDL", "LDLCALC", "LDLDIRECT", "TRIG", "CHOLHDL"    Recent Labs  Lab 07/02/22 0612  CALCIUM 7.8*     RADIOLOGY STUDIES/RESULTS: No results found.   LOS: 8 days   Signature  -    Susa Raring M.D on 07/08/2022 at 7:28 AM   -  To page go to [email or web address]

## 2022-07-08 NOTE — Discharge Summary (Signed)
[name] [name] WUJ:[id number] DOB: December 24, 1945 DOA: 06/30/2022  PCP: [facility]  Admit date: 06/30/2022  Discharge date: 07/08/2022  Admitted From: Home   Disposition:  Residential Hospice   Recommendations for Outpatient Follow-up:   Follow up with PCP in 1-2 weeks  PCP Please obtain BMP/CBC, 2 view CXR in 1week,  (see Discharge instructions)   PCP Please follow up on the following pending results:    Home Health: None   Equipment/Devices: None  Consultations: Pall.Care Discharge Condition: Stable    CODE STATUS: Full    Diet Recommendation: Soft    Chief Complaint  Patient presents with   Fall     Brief history of present illness from the day of admission and additional interim summary    77 y.o.  female with chronic HFpEF, dementia, CKD stage IIIa, hypothyroidism, bipolar disorder who presented with a mechanical fall-found to have AKI and subsequently admitted to the hospitalist service.                                                                  Hospital Course   Note after detailed discussions between family and palliative care on 07/02/2022 patient has now been transition to full comfort care, she is DNR.  We were hoping for a residential residential hospice bed family preference, unfortunately per residential hospice patient is not eligible for their inpatient criteria.  Only comfort medications on board.  TOC trying for appropriate placement.   Other active medical issues addressed this admission previously prior to transitioning to comfort measures as below.        AKI on CKD stage IIIa Suspect hemodynamically mediated-mild improvement upon supportive care, now transition to full comfort medications per family wishes.   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   Borderline hypotension Comfort meds only   Chronic HFpEF Euvolemic on exam     HLD Hold statin     DM-2 (A1c 5.4 on 5/16) supportive Rx now   Hypothyroidism Synthroid   History of breast cancer Supportive Rx now   Dementia with delirium Delirium precautions Discharge diagnosis     Principal Problem:   Acute renal failure superimposed on stage 3a chronic kidney disease (HCC) Active Problems:   Malignant neoplasm of upper-inner quadrant of right breast in female, estrogen receptor positive (HCC)   Chronic diastolic heart failure (HCC)   DMII (diabetes mellitus, type 2) (HCC)   Hypothyroidism   SIRS (systemic inflammatory response syndrome) (HCC)   Hypotension   Hypothermia   Prolonged QT interval  Discharge instructions    Discharge Instructions     Discharge instructions   Complete by: As directed    Disposition.  Residential hospice Condition.  Guarded CODE STATUS.  DNR Activity.  With assistance as tolerated, full fall precautions. Diet.  Soft with feeding assistance and aspiration precautions. Goal of care.  Comfort.       Discharge Medications   Allergies as of 07/08/2022   No Known Allergies      Medication List     STOP taking these medications    anastrozole 1 MG tablet Commonly known as: ARIMIDEX   cefadroxil 1 g tablet Commonly known as: DURICEF   Coenzyme Q10 150 MG Caps   feeding supplement (GLUCERNA SHAKE) Liqd   Fish Oil 1200 MG Caps   furosemide 20 MG tablet Commonly known as: LASIX   HEPARIN LOCK FLUSH IV   Magnesium 400 MG Tabs   oxyCODONE 5 MG immediate release tablet Commonly known as: Oxy IR/ROXICODONE   rosuvastatin 5 MG  tablet Commonly known as: CRESTOR   sodium chloride 0.9 % injection       TAKE these medications    acetaminophen 325 MG tablet Commonly known as: TYLENOL Take 2 tablets (650 mg total) by mouth every 6 (six) hours as needed for mild pain (or Fever >/= 101).   carbamazepine 200 MG 12 hr tablet Commonly known as: TEGRETOL XR Take 200 mg by mouth at bedtime.   hydrocortisone 2.5 % rectal cream Commonly known as: ANUSOL-HC Place 1 application rectally 2 (two) times daily.   lactulose 10 GM/15ML solution Commonly known as: CHRONULAC Take 15 mLs (10 g total) by mouth 2 (two) times daily as needed for mild constipation. What changed: when to take this   levothyroxine 88 MCG tablet Commonly known as: SYNTHROID Take 88 mcg by mouth daily before breakfast.   senna-docusate 8.6-50 MG tablet Commonly known as: Senokot-S Take 1 tablet by mouth 2 (two) times daily.          Major procedures and Radiology Reports - PLEASE review detailed and final reports thoroughly  -      CT PELVIS WO CONTRAST  Result Date: 07/01/2022 CLINICAL DATA:  Hip trauma with fracture suspected. Question pubic fracture. EXAM: CT PELVIS WITHOUT CONTRAST TECHNIQUE: Multidetector CT imaging of the pelvis was performed following the standard protocol without intravenous contrast. RADIATION DOSE REDUCTION: This exam was performed according to the departmental dose-optimization program which includes automated exposure control, adjustment of the mA and/or kV according to patient size and/or use of iterative reconstruction technique. COMPARISON:  Radiograph from yesterday FINDINGS: Remote fractures with completed healing and sclerosis at the right medial pubic bone and involving the left superior and inferior pubic rami. Remote fracture with band of sclerosis at the left sacral ala and crossing the sacral body. Both hips are intact and located. No acute soft tissue finding. Mild degenerative spurring at the hips.  IMPRESSION: Remote bilateral pubic bone and sacral insufficiency fractures. No acute fracture. Electronically Signed   By: Tiburcio Pea M.D.   On: 07/01/2022 15:53   US RENAL  Result Date: 07/01/2022 CLINICAL DATA:  [id number] AKI (acute kidney injury) (HCC) [id number] EXAM: RENAL / URINARY TRACT ULTRASOUND COMPLETE COMPARISON:  None Available. FINDINGS: Right Kidney: Renal measurements: 8.9 x 5.0 x 5.5 cm = volume: 126.4 mL. Two simple cysts measuring up to 2.3 and 2.9 cm respectively, no follow-up imaging is recommended. No hydronephrosis. Echogenic renal cortex with cortical thinning. Left Kidney: Renal measurements: 7.2 x  4.2 x 3.1 cm = volume: 48.3 mL. Two simple cysts measuring up to 1.2 and 1.0 cm respectively, no follow-up imaging is recommended. Echogenic cortex with cortical thinning. No hydronephrosis. Bladder: Appears normal for degree of bladder distention. Other: None. IMPRESSION: No hydronephrosis. Echogenic renal cortex bilaterally, as can be seen in medical renal disease. Electronically Signed   By: Caprice Renshaw M.D.   On: 07/01/2022 11:42   DG Pelvis Portable  Result Date: 06/30/2022 CLINICAL DATA:  Pain after fall EXAM: PORTABLE PELVIS 1-2 VIEWS COMPARISON:  None Available. FINDINGS: Patient is rotated to the right. Severe osteopenia. No dislocation. Slight deformity of the superior ramus of the left pubic bone. Preserved joint spaces. There are some scattered vascular calcifications. With this level of severe osteopenia a subtle nondisplaced injury is difficult to completely exclude and if needed further workup with CT or MRI as clinically appropriate. IMPRESSION: Severe osteopenia. Slight deformity of the left superior pubic ramus. A subtle fracture is possible. Additional cross-sectional evaluation as clinically appropriate Electronically Signed   By: Karen Kays M.D.   On: 06/30/2022 18:23   DG Chest Port 1 View  Result Date: 06/30/2022 CLINICAL DATA:  Sepsis.  Fall EXAM: PORTABLE CHEST  1 VIEW COMPARISON:  X-ray 03/08/2021 and CT scan FINDINGS: Patient is rotated to the left. The left inferior costophrenic angle is clipped off the edge of the film. Overlapping cardiac leads. Otherwise no consolidation, pneumothorax, effusion or edema. Normal cardiopericardial silhouette. Calcified aorta. Curvature and degenerative changes of the spine. Osteopenia. IMPRESSION: Grossly no acute cardiopulmonary disease.  Rotated radiograph. Electronically Signed   By: Karen Kays M.D.   On: 06/30/2022 18:19   CT Head Wo Contrast  Result Date: 06/30/2022 CLINICAL DATA:  Level 1 trauma EXAM: CT HEAD WITHOUT CONTRAST CT CERVICAL SPINE WITHOUT CONTRAST TECHNIQUE: Multidetector CT imaging of the head and cervical spine was performed following the standard protocol without intravenous contrast. Multiplanar CT image reconstructions of the cervical spine were also generated. RADIATION DOSE REDUCTION: This exam was performed according to the departmental dose-optimization program which includes automated exposure control, adjustment of the mA and/or kV according to patient size and/or use of iterative reconstruction technique. COMPARISON:  CT head and cervical spine 03/08/2021 FINDINGS: CT HEAD FINDINGS Brain: No evidence of acute infarct, hemorrhage, mass, mass effect, or midline shift. No hydrocephalus or extra-axial fluid collection. Vascular: No hyperdense vessel. Skull: Negative for fracture or focal lesion. Sinuses/Orbits: No acute finding. Other: The mastoid air cells are well aerated. CT CERVICAL SPINE FINDINGS Alignment: No traumatic listhesis. Reversal of the normal cervical lordosis with redemonstrated mild anterolisthesis of C3 on C4, trace retrolisthesis of C5 on C6, and trace anterolisthesis of C7 on T1. Skull base and vertebrae: No acute fracture or suspicious osseous lesion. Soft tissues and spinal canal: No prevertebral fluid or swelling. No visible canal hematoma. Disc levels: Degenerative changes in the  cervical spine. Moderate spinal canal stenosis at C5-C6. Multilevel facet and uncovertebral hypertrophy. Upper chest: Negative. IMPRESSION: 1. No acute intracranial process. 2. No acute fracture or traumatic listhesis in the cervical spine. These results were called by telephone at the time of interpretation on 06/30/2022 at 6:12 pm to provider [facility], who verbally acknowledged these results. Electronically Signed   By: Wiliam Ke M.D.   On: 06/30/2022 18:13   CT Cervical Spine Wo Contrast  Result Date: 06/30/2022 CLINICAL DATA:  Level 1 trauma EXAM: CT HEAD WITHOUT CONTRAST CT CERVICAL SPINE WITHOUT CONTRAST TECHNIQUE: Multidetector CT imaging of the head  and cervical spine was performed following the standard protocol without intravenous contrast. Multiplanar CT image reconstructions of the cervical spine were also generated. RADIATION DOSE REDUCTION: This exam was performed according to the departmental dose-optimization program which includes automated exposure control, adjustment of the mA and/or kV according to patient size and/or use of iterative reconstruction technique. COMPARISON:  CT head and cervical spine 03/08/2021 FINDINGS: CT HEAD FINDINGS Brain: No evidence of acute infarct, hemorrhage, mass, mass effect, or midline shift. No hydrocephalus or extra-axial fluid collection. Vascular: No hyperdense vessel. Skull: Negative for fracture or focal lesion. Sinuses/Orbits: No acute finding. Other: The mastoid air cells are well aerated. CT CERVICAL SPINE FINDINGS Alignment: No traumatic listhesis. Reversal of the normal cervical lordosis with redemonstrated mild anterolisthesis of C3 on C4, trace retrolisthesis of C5 on C6, and trace anterolisthesis of C7 on T1. Skull base and vertebrae: No acute fracture or suspicious osseous lesion. Soft tissues and spinal canal: No prevertebral fluid or swelling. No visible canal hematoma. Disc levels: Degenerative changes in the cervical spine. Moderate spinal  canal stenosis at C5-C6. Multilevel facet and uncovertebral hypertrophy. Upper chest: Negative. IMPRESSION: 1. No acute intracranial process. 2. No acute fracture or traumatic listhesis in the cervical spine. These results were called by telephone at the time of interpretation on 06/30/2022 at 6:12 pm to provider [facility], who verbally acknowledged these results. Electronically Signed   By: Wiliam Ke M.D.   On: 06/30/2022 18:13    Micro Results    Recent Results (from the past 240 hour(s))  Blood Culture (routine x 2)     Status: None   Collection Time: 06/30/22  9:00 PM   Specimen: BLOOD  Result Value Ref Range Status   Specimen Description BLOOD RIGHT ANTECUBITAL  Final   Special Requests   Final    BOTTLES DRAWN AEROBIC AND ANAEROBIC Blood Culture results may not be optimal due to an inadequate volume of blood received in culture bottles   Culture   Final    NO GROWTH 5 DAYS Performed at [facility], [address] [place], [place] [address]    Report Status 07/05/2022 FINAL  Final  Blood Culture (routine x 2)     Status: None   Collection Time: 06/30/22  9:10 PM   Specimen: BLOOD  Result Value Ref Range Status   Specimen Description BLOOD BLOOD RIGHT FOREARM  Final   Special Requests   Final    BOTTLES DRAWN AEROBIC AND ANAEROBIC Blood Culture results may not be optimal due to an inadequate volume of blood received in culture bottles   Culture   Final    NO GROWTH 5 DAYS Performed at [facility], [address], [place], [place] [address]    Report Status 07/05/2022 FINAL  Final  Resp panel by RT-PCR (RSV, Flu A&B, Covid) Anterior Nasal Swab     Status: None   Collection Time: 06/30/22 11:15 PM   Specimen: Anterior Nasal Swab  Result Value Ref Range Status   SARS Coronavirus 2 by RT PCR NEGATIVE NEGATIVE Final   Influenza A by PCR NEGATIVE NEGATIVE Final   Influenza B by PCR NEGATIVE NEGATIVE Final    Comment: (NOTE) The Xpert Xpress SARS-CoV-2/FLU/RSV plus  assay is intended as an aid in the diagnosis of influenza from Nasopharyngeal swab specimens and should not be used as a sole basis for treatment. Nasal washings and aspirates are unacceptable for Xpert Xpress SARS-CoV-2/FLU/RSV testing.  Fact Sheet for Patients: BloggerCourse.com  Fact Sheet for Healthcare Providers: SeriousBroker.it  This test is not yet approved or cleared by the [place] FDA and has been authorized for detection and/or diagnosis of SARS-CoV-2 by FDA under an Emergency Use Authorization (EUA). This EUA will remain in effect (meaning this test can be used) for the duration of the COVID-19 declaration under Section 564(b)(1) of the Act, 21 U.S.C. section 360bbb-3(b)(1), unless the authorization is terminated or revoked.     Resp Syncytial Virus by PCR NEGATIVE NEGATIVE Final    Comment: (NOTE) Fact Sheet for Patients: BloggerCourse.com  Fact Sheet for Healthcare Providers: SeriousBroker.it  This test is not yet approved or cleared by the [place] FDA and has been authorized for detection and/or diagnosis of SARS-CoV-2 by FDA under an Emergency Use Authorization (EUA). This EUA will remain in effect (meaning this test can be used) for the duration of the COVID-19 declaration under Section 564(b)(1) of the Act, 21 U.S.C. section 360bbb-3(b)(1), unless the authorization is terminated or revoked.  Performed at [facility], [address], [place], [place] [address]     Today   Subjective    Camree Tofte today has no headache,no chest abdominal pain,no new weakness tingling or numbness, feels much better   Objective   Blood pressure 113/66, pulse 66, temperature 97.8 F (36.6 C), temperature source Oral, resp. rate 11, height 5\' 6"  (1.676 m), weight 47.6 kg, SpO2 92 %.  No intake or output data in the 24 hours ending 07/08/22  1225  Exam  Awake Milli confused, no focal deficits, bandage on the left side of her face .AT,PERRAL Supple Neck,   Symmetrical Chest wall movement, Good air movement bilaterally, CTAB RRR,No Gallops,   +ve B.Sounds, Abd Soft, Non tender,  No Cyanosis, Clubbing or edema    Data Review   Recent Labs  Lab 07/02/22 0612  WBC 9.8  HGB 9.9*  HCT 30.3*  PLT 127*  MCV 92.7  MCH 30.3  MCHC 32.7  RDW 14.6    Recent Labs  Lab 07/02/22 0612  NA 143  K 3.1*  CL 108  CO2 23  ANIONGAP 12  GLUCOSE 76  BUN 52*  CREATININE 2.00*  CALCIUM 7.8*    Total Time in preparing paper work, data evaluation and todays exam - 35 minutes  Signature  -    Susa Raring M.D on 07/08/2022 at 12:25 PM   -  To page go to [email or web address]

## 2022-07-08 NOTE — Discharge Instructions (Signed)
Disposition.  Residential hospice °Condition.  Guarded °CODE STATUS.  DNR °Activity.  With assistance as tolerated, full fall precautions. °Diet.  Soft with feeding assistance and aspiration precautions. °Goal of care.  Comfort. ° °

## 2022-07-08 NOTE — TOC Transition Note (Signed)
Transition of Care [facility]) - CM/SW Discharge Note   Patient Details  Name: [name] [name] MRN: [id number] Date of Birth: Aug 20, 1945  [facility] CM/SW Contact:  Mearl Latin, LCSW Phone Number: 07/08/2022, 2:14 PM   Clinical Narrative:    [facility] Anticipated DC date: 07/08/22 Family notified: Daughter, Surveyor, minerals by: Sharin Mons   Per MD patient ready for DC to [facility] [place]. RN to call report prior to discharge [phone]). RN, patient, patient's family, and facility notified of DC. Discharge Summary and FL2 sent to facility. DC packet on chart including signed DNR. Ambulance transport requested for patient.   CSW will sign off for now as social work intervention is no longer needed. Please consult [place] again if new needs arise.     Final next level of care: Hospice Medical Facility Barriers to Discharge: Barriers Resolved   Patient Goals and CMS Choice CMS Medicare.gov Compare Post Acute Care list provided to:: Patient Represenative (must comment) Choice offered to / list presented to : Adult Children  Discharge Placement                Patient chooses bed at:  [facility] [place]) Patient to be transferred to facility by: [facility] Name of family member notified: Daughter Patient and family notified of of transfer: 07/08/22  Discharge Plan and Services Additional resources added to the After Visit Summary for   In-house Referral: Clinical Social Work, Hospice / Palliative Care   Post Acute Care Choice: Hospice                               Social Determinants of Health (SDOH) Interventions SDOH Screenings   Depression (PHQ2-9): Low Risk  (05/19/2021)  Tobacco Use: Low Risk  (06/30/2022)     Readmission Risk Interventions    04/02/2021    2:53 PM  Readmission Risk Prevention Plan  HRI or Home Care Consult Complete  Social Work Consult for Recovery Care  Planning/Counseling Complete  Palliative Care Screening Not Applicable

## 2022-07-08 NOTE — TOC Progression Note (Signed)
Transition of Care [facility]) - Progression Note    Patient Details  Name: [name] [name] MRN: [id number] Date of Birth: February 05, 1946  Transition of Care [facility]) CM/SW Contact  Mearl Latin, LCSW Phone Number: 07/08/2022, 10:59 AM  Clinical Narrative:    CSW spoke with patient's daughter and she stated she spoke with [facility] [place] and she would like to proceed with a bed at [facility].  [facility] [place] does not have a bed available yet but will let CSW know when one opens up.     Barriers to Discharge: Hospice Bed availability   Expected Discharge Plan and Services In-house Referral: Clinical Social Work, Hospice / Palliative Care   Post Acute Care Choice: Hospice Living arrangements for the past 2 months: Single Family Home                                       Social Determinants of Health (SDOH) Interventions SDOH Screenings   Depression (PHQ2-9): Low Risk  (05/19/2021)  Tobacco Use: Low Risk  (06/30/2022)    Readmission Risk Interventions    04/02/2021    2:53 PM  Readmission Risk Prevention Plan  HRI or Home Care Consult Complete  Social Work Consult for Recovery Care Planning/Counseling Complete  Palliative Care Screening Not Applicable

## 2022-08-16 DEATH — deceased
